# Patient Record
Sex: Female | Born: 1955 | Race: Black or African American | Hispanic: No | Marital: Married | Smoking: Never smoker
Health system: Southern US, Community
[De-identification: ages and names within clinical notes are randomized; demographics above are authoritative.]

## PROBLEM LIST (undated history)

## (undated) DIAGNOSIS — K922 Gastrointestinal hemorrhage, unspecified: Secondary | ICD-10-CM

## (undated) DIAGNOSIS — E785 Hyperlipidemia, unspecified: Secondary | ICD-10-CM

## (undated) DIAGNOSIS — J439 Emphysema, unspecified: Secondary | ICD-10-CM

## (undated) HISTORY — PX: OOPHORECTOMY: SHX86

## (undated) HISTORY — PX: NO PAST SURGERIES: SHX2092

---

## 2004-11-17 ENCOUNTER — Other Ambulatory Visit: Payer: Self-pay

## 2004-11-17 ENCOUNTER — Emergency Department: Payer: Self-pay | Admitting: Emergency Medicine

## 2013-01-12 ENCOUNTER — Emergency Department: Payer: Self-pay | Admitting: Emergency Medicine

## 2013-01-21 ENCOUNTER — Ambulatory Visit: Payer: Self-pay | Admitting: Specialist

## 2013-01-29 ENCOUNTER — Ambulatory Visit: Payer: Self-pay | Admitting: Specialist

## 2014-08-22 NOTE — Consult Note (Signed)
Brief Consult Note: Diagnosis: Displaced right bimalleolar ankle fracture.   Patient was seen by consultant.   Recommend to proceed with surgery or procedure.   Recommend further assessment or treatment.   Orders entered.   Discussed with Attending MD.   Comments: 59 year old female slipped on a ramp at uncle's house today injuring the right ankle.  Brought to Emergency Room where exam and X-rays show a displaced bimalleolar fracture right ankle. I recommended open reduction and internal fixation of the fracture but she does not want surgery if at all possible.  We discussed risk of poor healing or healing out of place in a cast but she wants to try this. Will plan on closed reduction under intrarticular anesthesia.    Exam:  Alert and cooperative.  circulation/sensation/motor function good and skin intact.  Moderate swelling of ankle.  Pain with range of motion.  X-rays:  as above  Rx:  closed reduction done with intraarticular 2% lidocaine.  short leg cast applied        Post reduction X-rays show improved position.          Will discharge home with ice and elevation.        return to clinic 5-6 days for X-rays. Have advised patient and husband that surgery may be needed later.  Electronic Signatures: Park Breed (MD)  (Signed 13-Sep-14 17:33)  Authored: Brief Consult Note   Last Updated: 13-Sep-14 17:33 by Park Breed (MD)

## 2016-10-31 ENCOUNTER — Emergency Department: Payer: Medicaid Other

## 2016-10-31 ENCOUNTER — Encounter: Payer: Self-pay | Admitting: Emergency Medicine

## 2016-10-31 ENCOUNTER — Inpatient Hospital Stay
Admission: EM | Admit: 2016-10-31 | Discharge: 2016-11-09 | DRG: 641 | Disposition: A | Discharge status: Home Health | Payer: Medicaid Other | Attending: Internal Medicine | Admitting: Internal Medicine

## 2016-10-31 DIAGNOSIS — R609 Edema, unspecified: Secondary | ICD-10-CM

## 2016-10-31 DIAGNOSIS — R911 Solitary pulmonary nodule: Secondary | ICD-10-CM | POA: Diagnosis present

## 2016-10-31 DIAGNOSIS — Z7982 Long term (current) use of aspirin: Secondary | ICD-10-CM

## 2016-10-31 DIAGNOSIS — R52 Pain, unspecified: Secondary | ICD-10-CM

## 2016-10-31 DIAGNOSIS — I272 Pulmonary hypertension, unspecified: Secondary | ICD-10-CM | POA: Diagnosis present

## 2016-10-31 DIAGNOSIS — J9811 Atelectasis: Secondary | ICD-10-CM | POA: Diagnosis present

## 2016-10-31 DIAGNOSIS — R000 Tachycardia, unspecified: Secondary | ICD-10-CM | POA: Diagnosis present

## 2016-10-31 DIAGNOSIS — E785 Hyperlipidemia, unspecified: Secondary | ICD-10-CM | POA: Diagnosis present

## 2016-10-31 DIAGNOSIS — E860 Dehydration: Principal | ICD-10-CM | POA: Diagnosis present

## 2016-10-31 DIAGNOSIS — Z79899 Other long term (current) drug therapy: Secondary | ICD-10-CM

## 2016-10-31 DIAGNOSIS — M25461 Effusion, right knee: Secondary | ICD-10-CM | POA: Diagnosis present

## 2016-10-31 DIAGNOSIS — Y92002 Bathroom of unspecified non-institutional (private) residence single-family (private) house as the place of occurrence of the external cause: Secondary | ICD-10-CM

## 2016-10-31 DIAGNOSIS — M25569 Pain in unspecified knee: Secondary | ICD-10-CM

## 2016-10-31 DIAGNOSIS — R0902 Hypoxemia: Secondary | ICD-10-CM

## 2016-10-31 DIAGNOSIS — R0602 Shortness of breath: Secondary | ICD-10-CM

## 2016-10-31 DIAGNOSIS — J439 Emphysema, unspecified: Secondary | ICD-10-CM | POA: Diagnosis present

## 2016-10-31 DIAGNOSIS — R531 Weakness: Secondary | ICD-10-CM

## 2016-10-31 DIAGNOSIS — M2391 Unspecified internal derangement of right knee: Secondary | ICD-10-CM | POA: Diagnosis present

## 2016-10-31 DIAGNOSIS — I2510 Atherosclerotic heart disease of native coronary artery without angina pectoris: Secondary | ICD-10-CM | POA: Diagnosis present

## 2016-10-31 DIAGNOSIS — W19XXXA Unspecified fall, initial encounter: Secondary | ICD-10-CM | POA: Diagnosis present

## 2016-10-31 HISTORY — DX: Hyperlipidemia, unspecified: E78.5

## 2016-10-31 LAB — CBC
HEMATOCRIT: 47.2 % — AB (ref 35.0–47.0)
Hemoglobin: 14.4 g/dL (ref 12.0–16.0)
MCH: 25.4 pg — ABNORMAL LOW (ref 26.0–34.0)
MCHC: 30.6 g/dL — AB (ref 32.0–36.0)
MCV: 83 fL (ref 80.0–100.0)
Platelets: 245 10*3/uL (ref 150–440)
RBC: 5.68 MIL/uL — ABNORMAL HIGH (ref 3.80–5.20)
RDW: 19.6 % — AB (ref 11.5–14.5)
WBC: 7.3 10*3/uL (ref 3.6–11.0)

## 2016-10-31 LAB — TROPONIN I: Troponin I: <0.03 ng/mL (ref <0.03)

## 2016-10-31 LAB — BRAIN NATRIURETIC PEPTIDE: B Natriuretic Peptide: 13 pg/mL (ref 0.0–100.0)

## 2016-10-31 LAB — BASIC METABOLIC PANEL
Anion gap: 14 (ref 5–15)
BUN: 9 mg/dL (ref 6–20)
CHLORIDE: 102 mmol/L (ref 101–111)
CO2: 27 mmol/L (ref 22–32)
CREATININE: 0.35 mg/dL — AB (ref 0.44–1.00)
Calcium: 9.6 mg/dL (ref 8.9–10.3)
GFR calc Af Amer: >60 mL/min (ref >60)
GFR calc non Af Amer: >60 mL/min (ref >60)
GLUCOSE: 163 mg/dL — AB (ref 65–99)
POTASSIUM: 4.1 mmol/L (ref 3.5–5.1)
Sodium: 143 mmol/L (ref 135–145)

## 2016-10-31 MED ORDER — IOPAMIDOL (ISOVUE-370) INJECTION 76%
75.0000 mL | Freq: Once | INTRAVENOUS | Status: AC | PRN
  Administered 2016-10-31: 75 mL via INTRAVENOUS

## 2016-10-31 MED ORDER — SODIUM CHLORIDE 0.9 % IV BOLUS (SEPSIS)
500.0000 mL | Freq: Once | INTRAVENOUS | Status: AC
  Administered 2016-10-31: 500 mL via INTRAVENOUS

## 2016-10-31 NOTE — ED Notes (Signed)
Pt taken to CT at this time.

## 2016-10-31 NOTE — ED Notes (Signed)
NAD noted at this time. Pt resting in bed with son at bedside. Pt is alert and oriented. Pt's arm repositioned to allow for fluids to run through IV. Pt denies any needs at this time. Will continue to monitor for further patient needs.

## 2016-10-31 NOTE — ED Provider Notes (Signed)
[facility] Emergency Department Provider Note  ____________________________________________   I have reviewed the triage vital signs and the nursing notes.   HISTORY  Chief Complaint Weakness and Fall    HPI Kayla Boyer is a 61 y.o. female  who presents today after notable fall. She was walking in her knee gave out. She has a bad knee. She didn't hit her head, she sank to the floor. She has some into that knee on the right as well as some pain to the right hip. However EMS noted her sats are 86%. Patient is not on home oxygen. She denies any cough or fever. She denies shortness of breath. She feels her normal self. Heart rate is somewhat elevated also for EMS. No history of PE or DVT. Patient states she feels "pretty good". Patient is remarkably poor historian. Rona Ravens level V    History reviewed. No pertinent past medical history.  There are no active problems to display for this patient.   History reviewed. No pertinent surgical history.  Prior to Admission medications   Not on File    Allergies Patient has no known allergies.  History reviewed. No pertinent family history.  Social History Social History  Substance Use Topics  . Smoking status: Never Smoker  . Smokeless tobacco: Never Used  . Alcohol use Yes     Comment: Occ    Review of Systems Constitutional: No fever/chills Eyes: No visual changes. ENT: No sore throat. No stiff neck no neck pain Cardiovascular: Denies chest pain. Respiratory: Denies shortness of breath. Gastrointestinal:   no vomiting.  No diarrhea.  No constipation. Genitourinary: Negative for dysuria. Musculoskeletal: Negative lower extremity swelling Skin: Negative for rash. Neurological: Negative for severe headaches, focal weakness or numbness.   ____________________________________________   PHYSICAL EXAM:  VITAL SIGNS: ED Triage Vitals  Enc Vitals Group     BP 10/31/16 1825 (!) 159/122     Pulse Rate  10/31/16 1825 (!) 129     Resp 10/31/16 1825 (!) 32     Temp 10/31/16 1826 98.5 F (36.9 C)     Temp Source 10/31/16 1826 Oral     SpO2 10/31/16 1824 (!) 88 %     Weight 10/31/16 1825 160 lb (72.6 kg)     Height 10/31/16 1825 5\' 2"  (1.575 m)     Head Circumference --      Peak Flow --      Pain Score --      Pain Loc --      Pain Edu? --      Excl. in [place]? --     Constitutional: Alert and oriented. Well appearing and in no acute distress. Eyes: Conjunctivae are normal Head: Atraumatic HEENT: No congestion/rhinnorhea. Mucous membranes are moist.  Oropharynx non-erythematous Neck:   Nontender with no meningismus, no masses, no stridor Cardiovascular:Mild tachycardia noted, regular rhythm. Grossly normal heart sounds.  Good peripheral circulation. Respiratory: Normal respiratory effort.  No retractions. Lungs CTAB. Abdominal: Soft and nontender. No distention. No guarding no rebound Back:  There is no focal tenderness or step off.  there is no midline tenderness there are no lesions noted. there is no CVA tenderness Musculoskeletal: Mild tenderness to palpation to the right knee with no evidence of acute injury, mild tenderness to the right hip with palpation but full range of motion., no upper extremity tenderness. No joint effusions, no DVT signs strong distal pulses no edema Neurologic:  Normal speech and language. No gross focal neurologic deficits  are appreciated.  Skin:  Skin is warm, dry and intact. No rash noted. Psychiatric: Mood and affect are normal. Speech and behavior are normal.  ____________________________________________   LABS (all labs ordered are listed, but only abnormal results are displayed)  Labs Reviewed  BASIC METABOLIC PANEL - Abnormal; Notable for the following:       Result Value   Glucose, Bld 163 (*)    Creatinine, Ser 0.35 (*)    All other components within normal limits  CBC - Abnormal; Notable for the following:    RBC 5.68 (*)    HCT 47.2 (*)     MCH 25.4 (*)    MCHC 30.6 (*)    RDW 19.6 (*)    All other components within normal limits  BRAIN NATRIURETIC PEPTIDE  TROPONIN I  URINALYSIS, COMPLETE (UACMP) WITH MICROSCOPIC  CBG MONITORING, ED   ____________________________________________  EKG  I personally interpreted any EKGs ordered by me or triage Appears to be sinus tach with no acute ischemic changes. ____________________________________________  DPOEUMPNT  I reviewed any imaging ordered by me or triage that were performed during my shift and, if possible, patient and/or family made aware of any abnormal findings. ____________________________________________   PROCEDURES  Procedure(s) performed: None  Procedures  Critical Care performed: None  ____________________________________________   INITIAL IMPRESSION / ASSESSMENT AND PLAN / ED COURSE  Pertinent labs & imaging results that were available during my care of the patient were reviewed by me and considered in my medical decision making (see chart for details).  Patient here with a non-syncopal fall in minimal discomfort as a result. However more concerning is her tachycardia. She states she's just nervous about being here, we are giving her IV fluids for this and is gradually coming down. However given her hypoxia and tachycardia with no convincing chest x-ray pathology did do a CT scan of her chest CT scan does not show a PE. Patient is 97% on 2 L only take her off she goes down to 89 and 88. The cervix could be chronic but I think there is admission for further observation and evaluation. No evidence of pneumonia or sepsis.    ____________________________________________   FINAL CLINICAL IMPRESSION(S) / ED DIAGNOSES  Final diagnoses:  SOB (shortness of breath)  Hypoxia  Weakness      This chart was dictated using voice recognition software.  Despite best efforts to proofread,  errors can occur which can change meaning.      Schuyler Amor,  MD 10/31/16 2131

## 2016-10-31 NOTE — ED Triage Notes (Signed)
Pt came in from home via [facility].  Pt fell in bathroom with no injury, but stated that she was dead weight when they came to pick her up.  Pt stated that she felt more weak than normal.  Pt states she has had increasing weakness over the past few months.  Pt A&Ox4 and in NAD.

## 2016-10-31 NOTE — ED Notes (Signed)
This RN to bedside at this time. This RN apologized for delay and explained to patient that this RN was caught in critical room. Pt states understanding. Will continue to monitor for further patient needs.

## 2016-10-31 NOTE — H&P (Signed)
[facility] NAME: Kayla Boyer    MR#:  [id number]  DATE OF BIRTH:  12/05/1955  DATE OF ADMISSION:  10/31/2016  PRIMARY CARE PHYSICIAN: Patient, [name]   REQUESTING/REFERRING PHYSICIAN: Burlene Arnt, MD  CHIEF COMPLAINT:   Chief Complaint  Patient presents with  . Weakness  . Fall    HISTORY OF PRESENT ILLNESS:  Kayla Boyer  is a 61 y.o. female who presents with Weakness and fall. Patient states that she has a bad knee, and it gave out on her and she sank to the floor. She denies any trauma, or hitting her head. Here in the ED she was found to be persistently tachycardic, sinus tach. She denies any chest pain or palpitations, or other cardiac equivalent symptoms. Unclear etiology for her sinus tachycardia, and hospitalists were called for admission for further evaluation.  PAST MEDICAL HISTORY:   Past Medical History:  Diagnosis Date  . HLD (hyperlipidemia)     PAST SURGICAL HISTORY:   Past Surgical History:  Procedure Laterality Date  . NO PAST SURGERIES      SOCIAL HISTORY:   Social History  Substance Use Topics  . Smoking status: Never Smoker  . Smokeless tobacco: Never Used  . Alcohol use Yes     Comment: Occ    FAMILY HISTORY:   Family History  Problem Relation Age of Onset  . Family history unknown: Yes    DRUG ALLERGIES:  No Known Allergies  MEDICATIONS AT HOME:   Prior to Admission medications   Medication Sig Start Date End Date Taking? Authorizing Provider  aspirin EC 81 MG tablet Take 81 mg by mouth daily.   Yes [provider]  ferrous sulfate 325 (65 FE) MG tablet Take 325 mg by mouth daily with breakfast.   Yes [provider]  omega-3 acid ethyl esters (LOVAZA) 1 g capsule Take 1 g by mouth daily.   Yes [provider]  vitamin B-12 (CYANOCOBALAMIN) 1000 MCG tablet Take 1,000 mcg by mouth daily.   Yes [provider]  vitamin C (ASCORBIC ACID)  500 MG tablet Take 500 mg by mouth daily.   Yes [provider]    REVIEW OF SYSTEMS:  Review of Systems  Constitutional: Negative for chills, fever, malaise/fatigue and weight loss.  HENT: Negative for ear pain, hearing loss and tinnitus.   Eyes: Negative for blurred vision, double vision, pain and redness.  Respiratory: Negative for cough, hemoptysis and shortness of breath.   Cardiovascular: Negative for chest pain, palpitations, orthopnea and leg swelling.  Gastrointestinal: Negative for abdominal pain, constipation, diarrhea, nausea and vomiting.  Genitourinary: Negative for dysuria, frequency and hematuria.  Musculoskeletal: Positive for falls. Negative for back pain, joint pain and neck pain.  Skin:       No acne, rash, or lesions  Neurological: Negative for dizziness, tremors, focal weakness and weakness.  Endo/Heme/Allergies: Negative for polydipsia. Does not bruise/bleed easily.  Psychiatric/Behavioral: Negative for depression. The patient is not nervous/anxious and does not have insomnia.      VITAL SIGNS:   Vitals:   10/31/16 2130 10/31/16 2200 10/31/16 2230 10/31/16 2300  BP: (!) 159/96 (!) 164/94 (!) 150/88 (!) 157/84  Pulse: (!) 118 (!) 116 (!) 115 (!) 110  Resp: (!) 25 (!) 27 (!) 27 (!) 25  Temp:      TempSrc:      SpO2: 100% 98% 97% 97%  Weight:  Height:       Wt Readings from Last 3 Encounters:  10/31/16 72.6 kg (160 lb)    PHYSICAL EXAMINATION:  Physical Exam  Vitals reviewed. Constitutional: She is oriented to person, place, and time. She appears well-developed and well-nourished. No distress.  HENT:  Head: Normocephalic and atraumatic.  Mouth/Throat: Oropharynx is clear and moist.  Eyes: Conjunctivae and EOM are normal. Pupils are equal, round, and reactive to light. No scleral icterus.  Neck: Normal range of motion. Neck supple. No JVD present. No thyromegaly present.  Cardiovascular: Regular rhythm and intact distal pulses.  Exam  reveals no gallop and no friction rub.   No murmur heard. Tachycardic  Respiratory: Effort normal and breath sounds normal. No respiratory distress. She has no wheezes. She has no rales.  GI: Soft. Bowel sounds are normal. She exhibits no distension. There is no tenderness.  Musculoskeletal: Normal range of motion. She exhibits no edema.  No arthritis, no gout  Lymphadenopathy:    She has no cervical adenopathy.  Neurological: She is alert and oriented to person, place, and time. No cranial nerve deficit.  No dysarthria, no aphasia  Skin: Skin is warm and dry. No rash noted. No erythema.  Psychiatric: She has a normal mood and affect. Her behavior is normal. Judgment and thought content normal.    LABORATORY PANEL:   CBC  Recent Labs Lab 10/31/16 1827  WBC 7.3  HGB 14.4  HCT 47.2*  PLT 245   ------------------------------------------------------------------------------------------------------------------  Chemistries   Recent Labs Lab 10/31/16 1827  NA 143  K 4.1  CL 102  CO2 27  GLUCOSE 163*  BUN 9  CREATININE 0.35*  CALCIUM 9.6   ------------------------------------------------------------------------------------------------------------------  Cardiac Enzymes  Recent Labs Lab 10/31/16 1827  TROPONINI <0.03   ------------------------------------------------------------------------------------------------------------------  RADIOLOGY:  Dg Chest 2 View  Result Date: 10/31/2016 CLINICAL DATA:  Patient fell in the bathroom today. Shortness of breath. EXAM: CHEST  2 VIEW COMPARISON:  None. FINDINGS: Two views study shows mild asymmetric elevation left hemidiaphragm. No evidence for pneumothorax. No overt pulmonary edema. There is basilar atelectasis with probable tiny bilateral pleural effusions. Cardiopericardial silhouette is enlarged. Bones are diffusely demineralized without an acute rib fracture evident. IMPRESSION: Cardiomegaly with basilar atelectasis and  probable tiny bilateral pleural effusions. Electronically Signed   By: Misty Stanley M.D.   On: 10/31/2016 19:57   Dg Knee 2 Views Right  Result Date: 10/31/2016 CLINICAL DATA:  Acute right knee pain following fall today. Initial encounter. EXAM: RIGHT KNEE - 1-2 VIEW COMPARISON:  None. FINDINGS: No evidence of fracture, dislocation, or joint effusion. No evidence of arthropathy or other focal bone abnormality. Soft tissues are unremarkable. IMPRESSION: Negative. Electronically Signed   By: Margarette Canada M.D.   On: 10/31/2016 19:57   Ct Angio Chest Pe W Or Wo Contrast  Result Date: 10/31/2016 CLINICAL DATA:  Shortness of breath.  Fall in the bathroom. EXAM: CT ANGIOGRAPHY CHEST WITH CONTRAST TECHNIQUE: Multidetector CT imaging of the chest was performed using the standard protocol during bolus administration of intravenous contrast. Multiplanar CT image reconstructions and MIPs were obtained to evaluate the vascular anatomy. CONTRAST:  75 cc Isovue 370 IV COMPARISON:  Chest radiograph earlier this day FINDINGS: Cardiovascular: No filling defects in the pulmonary arteries to the proximal segmental level, more distal branches cannot be assessed due to contrast bolus timing. Tortuous thoracic aorta without dissection or acute abnormality. Mild cardiomegaly. There are coronary artery calcifications. No pericardial effusion. Mediastinum/Nodes: Prominent lower paratracheal node  measures 11 mm. No hilar adenopathy. Upper esophagus mildly patulous, distal esophagus decompressed. Visualized thyroid gland is normal. Lungs/Pleura: Lower lobe volume loss with atelectasis. There is an ill-defined 9 x 7 mm nodule the right upper lobe abutting the apical pulmonary artery, series 7, image 19. Trachea mainstem bronchi are patent. No definite pleural fluid. No pulmonary edema. Upper Abdomen: No acute abnormality. Musculoskeletal: There are no acute or suspicious osseous abnormalities. Degenerative change in the spine. 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 Review of the MIP images confirms the above findings. IMPRESSION: 1. No central pulmonary embolus. 2. Minimal coronary artery calcifications.  Mild cardiomegaly. 3. Lower lobe volume loss with atelectasis. 4. Ill-defined 9 x 7 mm right upper lobe pulmonary nodule. Consider one of the following in 3 months for both low-risk and high-risk individuals: (a) repeat chest CT, (b) follow-up PET-CT, or (c) tissue sampling. This recommendation follows the consensus statement: Guidelines for Management of Incidental Pulmonary Nodules Detected on CT Images: From the Fleischner Society 2017; Radiology 2017; 284:228-243. Given this nodule is adjacent to a pulmonary artery, recommend either repeat chest CT or follow-up PET-CT over tissue sampling. Electronically Signed   By: Jeb Levering M.D.   On: 10/31/2016 21:03   Ct Hip Right Wo Contrast  Result Date: 10/31/2016 CLINICAL DATA:  Fall in the bathroom.  Right hip pain. EXAM: CT OF THE RIGHT HIP WITHOUT CONTRAST TECHNIQUE: Multidetector CT imaging of the right hip was performed according to the standard protocol. Multiplanar CT image reconstructions were also generated. COMPARISON:  Radiographs earlier this day. FINDINGS: Bones/Joint/Cartilage No acute fracture. Pubic rami and proximal femur are intact. Mild hip joint space narrowing and acetabular spurring. No hip joint effusion. Scattered regions is low-density in the proximal femur and acetabulum consistent with fatty marrow. Ligaments Suboptimally assessed by CT. Muscles and Tendons Mild fatty atrophy of the gluteal and upper thigh musculature. No intramuscular hematoma or fluid collection. Soft tissues No confluent hematoma.  No acute soft tissue abnormality. IMPRESSION: 1. No acute fracture of the right hip. 2. 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 Electronically Signed   By: Jeb Levering M.D.   On: 10/31/2016 21:10   Dg Hip Unilat W Or Wo Pelvis 2-3  Views Right  Result Date: 10/31/2016 CLINICAL DATA:  Acute right hip pain following fall today. Initial encounter. EXAM: DG HIP (WITH OR WITHOUT PELVIS) 2-3V RIGHT COMPARISON:  None. FINDINGS: No definite right hip fracture identified but the right hip position is asymmetric to the left, which has a more normal appearance. There is no evidence of dislocation. No suspicious focal bony lesions are present. IMPRESSION: No definite acute bony abnormality but the position is asymmetric to the left which may be positional. Consider CT if there is strong clinical suspicion for hip fracture. Electronically Signed   By: Margarette Canada M.D.   On: 10/31/2016 20:01    EKG:   Orders placed or performed during the hospital encounter of 10/31/16  . ED EKG  . ED EKG  . EKG 12-Lead  . EKG 12-Lead    IMPRESSION AND PLAN:  Principal Problem:   Tachycardia - sinus tachycardia. Unclear etiology, perhaps due to deconditioning. TSH was within normal limits. We will keep her on telemetry tonight, hydrate her with IV fluids, monitor closely for any improvement Active Problems:   Fall - seems to be more mechanical in nature rather than cardiac related, however, precautionary workup as above   HLD (hyperlipidemia) - continue home meds  All the records are reviewed and case discussed with ED provider. Management plans discussed with the patient and/or family.  DVT PROPHYLAXIS: SubQ lovenox  GI PROPHYLAXIS: None  ADMISSION STATUS: Observation  CODE STATUS: Full Code Status History    This patient does not have a recorded code status. Please follow your organizational policy for patients in this situation.      TOTAL TIME TAKING CARE OF THIS PATIENT: 40 minutes.   Velencia Lenart [place] 10/31/2016, 11:27 PM  [facility]  Office  [phone]  CC: Primary care physician; Patient, [name]  Note:  This document was prepared using Dragon voice recognition software and may include  unintentional dictation errors.

## 2016-11-01 ENCOUNTER — Observation Stay: Payer: Medicaid Other

## 2016-11-01 LAB — URINALYSIS, COMPLETE (UACMP) WITH MICROSCOPIC
BACTERIA UA: NONE SEEN
BILIRUBIN URINE: NEGATIVE
GLUCOSE, UA: NEGATIVE mg/dL
HGB URINE DIPSTICK: NEGATIVE
Ketones, ur: 5 mg/dL — AB
NITRITE: NEGATIVE
Protein, ur: NEGATIVE mg/dL
SPECIFIC GRAVITY, URINE: 1.038 — AB (ref 1.005–1.030)
pH: 5 (ref 5.0–8.0)

## 2016-11-01 LAB — BASIC METABOLIC PANEL
Anion gap: 5 (ref 5–15)
BUN: 8 mg/dL (ref 6–20)
CALCIUM: 8.8 mg/dL — AB (ref 8.9–10.3)
CHLORIDE: 104 mmol/L (ref 101–111)
CO2: 33 mmol/L — AB (ref 22–32)
Glucose, Bld: 108 mg/dL — ABNORMAL HIGH (ref 65–99)
Potassium: 3.9 mmol/L (ref 3.5–5.1)
SODIUM: 142 mmol/L (ref 135–145)

## 2016-11-01 LAB — CBC
HCT: 38.9 % (ref 35.0–47.0)
Hemoglobin: 12.2 g/dL (ref 12.0–16.0)
MCH: 26.1 pg (ref 26.0–34.0)
MCHC: 31.4 g/dL — ABNORMAL LOW (ref 32.0–36.0)
MCV: 83.1 fL (ref 80.0–100.0)
PLATELETS: 209 10*3/uL (ref 150–440)
RBC: 4.67 MIL/uL (ref 3.80–5.20)
RDW: 19.7 % — AB (ref 11.5–14.5)
WBC: 8.6 10*3/uL (ref 3.6–11.0)

## 2016-11-01 LAB — TSH: TSH: 0.914 u[IU]/mL (ref 0.350–4.500)

## 2016-11-01 MED ORDER — ACETAMINOPHEN 650 MG RE SUPP: 650.0000 mg | Freq: Four times a day (QID) | RECTAL | Status: DC | PRN

## 2016-11-01 MED ORDER — ACETAMINOPHEN 325 MG PO TABS
650.0000 mg | ORAL_TABLET | Freq: Four times a day (QID) | ORAL | Status: DC | PRN
  Administered 2016-11-01 (×2): 650 mg via ORAL
  Filled 2016-11-01 (×2): qty 2

## 2016-11-01 MED ORDER — ONDANSETRON HCL 4 MG/2ML IJ SOLN: 4.0000 mg | Freq: Four times a day (QID) | INTRAMUSCULAR | Status: DC | PRN

## 2016-11-01 MED ORDER — SODIUM CHLORIDE 0.9 % IV SOLN
INTRAVENOUS | Status: AC
  Administered 2016-11-01: 02:00:00 via INTRAVENOUS

## 2016-11-01 MED ORDER — HYDROCODONE-ACETAMINOPHEN 5-325 MG PO TABS
1.0000 | ORAL_TABLET | Freq: Four times a day (QID) | ORAL | Status: DC | PRN
  Administered 2016-11-01 – 2016-11-02 (×3): 1 via ORAL
  Filled 2016-11-01 (×3): qty 1

## 2016-11-01 MED ORDER — ENOXAPARIN SODIUM 40 MG/0.4ML ~~LOC~~ SOLN
40.0000 mg | SUBCUTANEOUS | Status: DC
  Administered 2016-11-01 – 2016-11-09 (×8): 40 mg via SUBCUTANEOUS
  Filled 2016-11-01 (×9): qty 0.4

## 2016-11-01 MED ORDER — ASPIRIN EC 81 MG PO TBEC
81.0000 mg | DELAYED_RELEASE_TABLET | Freq: Every day | ORAL | Status: DC
  Administered 2016-11-01 – 2016-11-09 (×9): 81 mg via ORAL
  Filled 2016-11-01 (×9): qty 1

## 2016-11-01 MED ORDER — OMEGA-3-ACID ETHYL ESTERS 1 G PO CAPS
1.0000 g | ORAL_CAPSULE | Freq: Every day | ORAL | Status: DC
  Administered 2016-11-01 – 2016-11-09 (×9): 1 g via ORAL
  Filled 2016-11-01 (×9): qty 1

## 2016-11-01 MED ORDER — ONDANSETRON HCL 4 MG PO TABS: 4.0000 mg | ORAL_TABLET | Freq: Four times a day (QID) | ORAL | Status: DC | PRN

## 2016-11-01 NOTE — Progress Notes (Signed)
[place] at [place] NAME: Kayla Boyer    MR#:  [id number]  DATE OF BIRTH:  23-Feb-1956  SUBJECTIVE: admitted  for tachycardia, fall and now has right knee swelling, pain. Heart rate is better after fluids. No other complaints. No chest pain or shortness of breath or dizziness.   CHIEF COMPLAINT:   Chief Complaint  Patient presents with  . Weakness  . Fall    REVIEW OF SYSTEMS:   ROS CONSTITUTIONAL: No fever, fatigue or weakness.  EYES: No blurred or double vision.  EARS, NOSE, AND THROAT: No tinnitus or ear pain.  RESPIRATORY: No cough, shortness of breath, wheezing or hemoptysis.  CARDIOVASCULAR: No chest pain, orthopnea, edema.  GASTROINTESTINAL: No nausea, vomiting, diarrhea or abdominal pain.  GENITOURINARY: No dysuria, hematuria.  ENDOCRINE: No polyuria, nocturia,  HEMATOLOGY: No anemia, easy bruising or bleeding SKIN: No rash or lesion. MUSCULOSKELETAL: Right knee pain, swelling NEUROLOGIC: No tingling, numbness, weakness.  PSYCHIATRY: No anxiety or depression.   DRUG ALLERGIES:  No Known Allergies  VITALS:  Blood pressure 124/72, pulse 97, temperature 98.6 F (37 C), temperature source Oral, resp. rate 18, height 5\' 2"  (1.575 m), weight 68 kg (150 lb), SpO2 94 %.  PHYSICAL EXAMINATION:  GENERAL:  61 y.o.-year-old patient lying in the bed with no acute distress.  EYES: Pupils equal, round, reactive to light and accommodation. No scleral icterus. Extraocular muscles intact.  HEENT: Head atraumatic, normocephalic. Oropharynx and nasopharynx clear.  NECK:  Supple, no jugular venous distention. No thyroid enlargement, no tenderness.  LUNGS: Normal breath sounds bilaterally, no wheezing, rales,rhonchi or crepitation. No use of accessory muscles of respiration.  CARDIOVASCULAR: S1, S2 normal. No murmurs, rubs, or gallops.  ABDOMEN: Soft, nontender, nondistended. Bowel sounds present. No organomegaly or mass.   EXTREMITIES: Right knee swelling, decreased range of motion but no tenderness to palpation. NEUROLOGIC: Cranial nerves II through XII are intact. Muscle strength 5/5 in all extremities. Sensation intact. Gait not checked.  PSYCHIATRIC: The patient is alert and oriented x 3.  SKIN: No obvious rash, lesion, or ulcer.    LABORATORY PANEL:   CBC  Recent Labs Lab 11/01/16 0539  WBC 8.6  HGB 12.2  HCT 38.9  PLT 209   ------------------------------------------------------------------------------------------------------------------  Chemistries   Recent Labs Lab 11/01/16 0539  NA 142  K 3.9  CL 104  CO2 33*  GLUCOSE 108*  BUN 8  CREATININE <0.30*  CALCIUM 8.8*   ------------------------------------------------------------------------------------------------------------------  Cardiac Enzymes  Recent Labs Lab 10/31/16 1827  TROPONINI <0.03   ------------------------------------------------------------------------------------------------------------------  RADIOLOGY:  Dg Chest 2 View  Result Date: 10/31/2016 CLINICAL DATA:  Patient fell in the bathroom today. Shortness of breath. EXAM: CHEST  2 VIEW COMPARISON:  None. FINDINGS: Two views study shows mild asymmetric elevation left hemidiaphragm. No evidence for pneumothorax. No overt pulmonary edema. There is basilar atelectasis with probable tiny bilateral pleural effusions. Cardiopericardial silhouette is enlarged. Bones are diffusely demineralized without an acute rib fracture evident. IMPRESSION: Cardiomegaly with basilar atelectasis and probable tiny bilateral pleural effusions. Electronically Signed   By: Misty Stanley M.D.   On: 10/31/2016 19:57   Dg Knee 2 Views Right  Result Date: 10/31/2016 CLINICAL DATA:  Acute right knee pain following fall today. Initial encounter. EXAM: RIGHT KNEE - 1-2 VIEW COMPARISON:  None. FINDINGS: No evidence of fracture, dislocation, or joint effusion. No evidence of arthropathy or other  focal bone abnormality. Soft tissues are unremarkable. IMPRESSION: Negative. Electronically Signed  By: Margarette Canada M.D.   On: 10/31/2016 19:57   Ct Angio Chest Pe W Or Wo Contrast  Result Date: 10/31/2016 CLINICAL DATA:  Shortness of breath.  Fall in the bathroom. EXAM: CT ANGIOGRAPHY CHEST WITH CONTRAST TECHNIQUE: Multidetector CT imaging of the chest was performed using the standard protocol during bolus administration of intravenous contrast. Multiplanar CT image reconstructions and MIPs were obtained to evaluate the vascular anatomy. CONTRAST:  75 cc Isovue 370 IV COMPARISON:  Chest radiograph earlier this day FINDINGS: Cardiovascular: No filling defects in the pulmonary arteries to the proximal segmental level, more distal branches cannot be assessed due to contrast bolus timing. Tortuous thoracic aorta without dissection or acute abnormality. Mild cardiomegaly. There are coronary artery calcifications. No pericardial effusion. Mediastinum/Nodes: Prominent lower paratracheal node measures 11 mm. No hilar adenopathy. Upper esophagus mildly patulous, distal esophagus decompressed. Visualized thyroid gland is normal. Lungs/Pleura: Lower lobe volume loss with atelectasis. There is an ill-defined 9 x 7 mm nodule the right upper lobe abutting the apical pulmonary artery, series 7, image 19. Trachea mainstem bronchi are patent. No definite pleural fluid. No pulmonary edema. Upper Abdomen: No acute abnormality. Musculoskeletal: There are no acute or suspicious osseous abnormalities. Degenerative change in the spine. Probable hemangioma within T8 vertebral body. Review of the MIP images confirms the above findings. IMPRESSION: 1. No central pulmonary embolus. 2. Minimal coronary artery calcifications.  Mild cardiomegaly. 3. Lower lobe volume loss with atelectasis. 4. Ill-defined 9 x 7 mm right upper lobe pulmonary nodule. Consider one of the following in 3 months for both low-risk and high-risk individuals: (a)  repeat chest CT, (b) follow-up PET-CT, or (c) tissue sampling. This recommendation follows the consensus statement: Guidelines for Management of Incidental Pulmonary Nodules Detected on CT Images: From the Fleischner Society 2017; Radiology 2017; 284:228-243. Given this nodule is adjacent to a pulmonary artery, recommend either repeat chest CT or follow-up PET-CT over tissue sampling. Electronically Signed   By: Jeb Levering M.D.   On: 10/31/2016 21:03   Ct Hip Right Wo Contrast  Result Date: 10/31/2016 CLINICAL DATA:  Fall in the bathroom.  Right hip pain. EXAM: CT OF THE RIGHT HIP WITHOUT CONTRAST TECHNIQUE: Multidetector CT imaging of the right hip was performed according to the standard protocol. Multiplanar CT image reconstructions were also generated. COMPARISON:  Radiographs earlier this day. FINDINGS: Bones/Joint/Cartilage No acute fracture. Pubic rami and proximal femur are intact. Mild hip joint space narrowing and acetabular spurring. No hip joint effusion. Scattered regions is low-density in the proximal femur and acetabulum consistent with fatty marrow. Ligaments Suboptimally assessed by CT. Muscles and Tendons Mild fatty atrophy of the gluteal and upper thigh musculature. No intramuscular hematoma or fluid collection. Soft tissues No confluent hematoma.  No acute soft tissue abnormality. IMPRESSION: 1. No acute fracture of the right hip. 2. 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 Electronically Signed   By: Jeb Levering M.D.   On: 10/31/2016 21:10   Dg Hip Unilat W Or Wo Pelvis 2-3 Views Right  Result Date: 10/31/2016 CLINICAL DATA:  Acute right hip pain following fall today. Initial encounter. EXAM: DG HIP (WITH OR WITHOUT PELVIS) 2-3V RIGHT COMPARISON:  None. FINDINGS: No definite right hip fracture identified but the right hip position is asymmetric to the left, which has a more normal appearance. There is no evidence of dislocation. No suspicious focal bony  lesions are present. IMPRESSION: No definite acute bony abnormality but the position is asymmetric to the left  which may be positional. Consider CT if there is strong clinical suspicion for hip fracture. Electronically Signed   By: Margarette Canada M.D.   On: 10/31/2016 20:01    EKG:   Orders placed or performed during the hospital encounter of 10/31/16  . ED EKG  . ED EKG  . EKG 12-Lead  . EKG 12-Lead    ASSESSMENT AND PLAN:   #1. sinus tachycardia likely due to dehydration: The heart rate better now, received IV hydration, she feels better today. #2. right knee pain, swelling CT of the right knee if there is no fracture patient can see orthopedic as an outpatient because she does not have any warmth I don't think she needs any acute intervention. She has chronic  Knee issues. Physical therapy consult placed.;   If CT is negative for any fracture she can be discharged home   All the records are reviewed and case discussed with Care Management/Social Workerr. Management plans discussed with the patient, family and they are in agreement.  CODE STATUS: full  TOTAL TIME TAKING CARE OF THIS PATIENT: 35 minutes.   POSSIBLE D/C IN 1 DAYS, DEPENDING ON CLINICAL CONDITION.   Epifanio Lesches M.D on 11/01/2016 at 1:29 PM  Between 7am to 6pm - Pager - [phone]  After 6pm go to [email or web address] - password [email or web address] [facility]  Office  [phone]  CC: Primary care physician; Patient, [name]   Note: This dictation was prepared with Dragon dictation along with smaller phrase technology. Any transcriptional errors that result from this process are unintentional.

## 2016-11-01 NOTE — Progress Notes (Signed)
To CT via bed.

## 2016-11-01 NOTE — Plan of Care (Signed)
Problem: Safety: Goal: Ability to remain free from injury will improve Outcome: Progressing Fall precautions in place, non skid socks when oob  Problem: Pain Managment: Goal: General experience of comfort will improve Outcome: Not Progressing Prn medications for knee pain

## 2016-11-01 NOTE — Progress Notes (Signed)
Chaplain responded to an OR for AD. CH met with pt, provided AD and education to pt. Pt asked for time to review the document with her son and will call [facility] when ready to complete it. [place] offered prayers and ministry of presence.

## 2016-11-01 NOTE — Progress Notes (Signed)
Back from CT

## 2016-11-01 NOTE — Care Management Note (Signed)
Case Management Note  Patient Details  Name: Keeley Sussman MRN: [id number] Date of Birth: August 22, 1955  Subjective/Objective:                  Met with patient and her husband to discuss discharge planning after patient gave permission for CM assessment. She does not have health insurance, she does not have a PCP, and she states the only medications OTC vitamins.  She relies on her husband for transportation. She states she has been "hopping" with a walker. Patient states her ambulation has progressively become worse she relates to an injury fracturing one of her extremities. She is not on O2 at this time. She appreciates any help she can receive as she states she have limited income. She has not applied for Medicaid. She states she is waiting to turn 63 for Medicare.   Action/Plan: Application delivered and explained to both patient and husband; Referral to [facility] and medication management to assist patient.  Expected Discharge Date:                  Expected Discharge Plan:     In-House Referral:     Discharge planning Services  CM Consult, Medication Assistance, [facility]  Post Acute Care Choice:    Choice offered to:     DME Arranged:    DME Agency:     HH Arranged:    HH Agency:     Status of Service:  In process, will continue to follow  If discussed at Long Length of Stay Meetings, dates discussed:    Additional Comments:  Marshell Garfinkel, RN 11/01/2016, 10:50 AM

## 2016-11-02 LAB — HIV ANTIBODY (ROUTINE TESTING W REFLEX): HIV Screen 4th Generation wRfx: NONREACTIVE

## 2016-11-02 MED ORDER — HYDROCODONE-ACETAMINOPHEN 5-325 MG PO TABS
1.0000 | ORAL_TABLET | Freq: Four times a day (QID) | ORAL | Status: DC | PRN
  Administered 2016-11-02 – 2016-11-05 (×6): 1 via ORAL
  Administered 2016-11-06: 2 via ORAL
  Administered 2016-11-06 – 2016-11-08 (×4): 1 via ORAL
  Filled 2016-11-02: qty 1
  Filled 2016-11-02: qty 2
  Filled 2016-11-02 (×9): qty 1

## 2016-11-02 MED ORDER — IBUPROFEN 400 MG PO TABS
400.0000 mg | ORAL_TABLET | Freq: Four times a day (QID) | ORAL | Status: DC | PRN
Start: 2016-11-02 — End: 2016-11-09
  Administered 2016-11-02 – 2016-11-07 (×7): 400 mg via ORAL
  Filled 2016-11-02 (×8): qty 1

## 2016-11-02 NOTE — Progress Notes (Signed)
[place] at [place] NAME: Kayla Boyer    MR#:  [id number]  DATE OF BIRTH:  10/05/1955  SUBJECTIVE: admitted  for tachycardia, fall and now has right knee swelling, pain. Right knee swollen, pain with range of motion to the left side, says that Motrin is helping. CAT scan of the knee shows lateral ligament tear. Physical therapy consult pending we will transfer the patient to on the floor. Tachycardia resolved. No chest pain or shortness of breath.   CHIEF COMPLAINT:   Chief Complaint  Patient presents with  . Weakness  . Fall    REVIEW OF SYSTEMS:   ROS CONSTITUTIONAL: No fever, fatigue or weakness.  EYES: No blurred or double vision.  EARS, NOSE, AND THROAT: No tinnitus or ear pain.  RESPIRATORY: No cough, shortness of breath, wheezing or hemoptysis.  CARDIOVASCULAR: No chest pain, orthopnea, edema.  GASTROINTESTINAL: No nausea, vomiting, diarrhea or abdominal pain.  GENITOURINARY: No dysuria, hematuria.  ENDOCRINE: No polyuria, nocturia,  HEMATOLOGY: No anemia, easy bruising or bleeding SKIN: No rash or lesion. MUSCULOSKELETAL: Right knee pain, swelling  NEUROLOGIC: No tingling, numbness, weakness.  PSYCHIATRY: No anxiety or depression.   DRUG ALLERGIES:  No Known Allergies  VITALS:  Blood pressure 130/72, pulse 68, temperature 98 F (36.7 C), temperature source Oral, resp. rate 16, height 5\' 2"  (1.575 m), weight 68 kg (150 lb), SpO2 96 %.  PHYSICAL EXAMINATION:  GENERAL:  61 y.o.-year-old patient lying in the bed with no acute distress.  EYES: Pupils equal, round, reactive to light and accommodation. No scleral icterus. Extraocular muscles intact.  HEENT: Head atraumatic, normocephalic. Oropharynx and nasopharynx clear.  NECK:  Supple, no jugular venous distention. No thyroid enlargement, no tenderness.  LUNGS: Normal breath sounds bilaterally, no wheezing, rales,rhonchi or crepitation. No use of accessory muscles of  respiration.  CARDIOVASCULAR: S1, S2 normal. No murmurs, rubs, or gallops.  ABDOMEN: Soft, nontender, nondistended. Bowel sounds present. No organomegaly or mass.  EXTREMITIES: Right knee swelling, decreased range of motion but no tenderness to palpation.Patient has more pain at the knee joint for lateral rotation.with lateral stretch. NEUROLOGIC: Cranial nerves II through XII are intact. Muscle strength 5/5 in all extremities. Sensation intact. Gait not checked.  PSYCHIATRIC: The patient is alert and oriented x 3.  SKIN: No obvious rash, lesion, or ulcer.    LABORATORY PANEL:   CBC  Recent Labs Lab 11/01/16 0539  WBC 8.6  HGB 12.2  HCT 38.9  PLT 209   ------------------------------------------------------------------------------------------------------------------  Chemistries   Recent Labs Lab 11/01/16 0539  NA 142  K 3.9  CL 104  CO2 33*  GLUCOSE 108*  BUN 8  CREATININE <0.30*  CALCIUM 8.8*   ------------------------------------------------------------------------------------------------------------------  Cardiac Enzymes  Recent Labs Lab 10/31/16 1827  TROPONINI <0.03   ------------------------------------------------------------------------------------------------------------------  RADIOLOGY:  Dg Chest 2 View  Result Date: 10/31/2016 CLINICAL DATA:  Patient fell in the bathroom today. Shortness of breath. EXAM: CHEST  2 VIEW COMPARISON:  None. FINDINGS: Two views study shows mild asymmetric elevation left hemidiaphragm. No evidence for pneumothorax. No overt pulmonary edema. There is basilar atelectasis with probable tiny bilateral pleural effusions. Cardiopericardial silhouette is enlarged. Bones are diffusely demineralized without an acute rib fracture evident. IMPRESSION: Cardiomegaly with basilar atelectasis and probable tiny bilateral pleural effusions. Electronically Signed   By: Misty Stanley M.D.   On: 10/31/2016 19:57   Dg Knee 2 Views  Right  Result Date: 10/31/2016 CLINICAL DATA:  Acute right knee  pain following fall today. Initial encounter. EXAM: RIGHT KNEE - 1-2 VIEW COMPARISON:  None. FINDINGS: No evidence of fracture, dislocation, or joint effusion. No evidence of arthropathy or other focal bone abnormality. Soft tissues are unremarkable. IMPRESSION: Negative. Electronically Signed   By: Margarette Canada M.D.   On: 10/31/2016 19:57   Ct Angio Chest Pe W Or Wo Contrast  Result Date: 10/31/2016 CLINICAL DATA:  Shortness of breath.  Fall in the bathroom. EXAM: CT ANGIOGRAPHY CHEST WITH CONTRAST TECHNIQUE: Multidetector CT imaging of the chest was performed using the standard protocol during bolus administration of intravenous contrast. Multiplanar CT image reconstructions and MIPs were obtained to evaluate the vascular anatomy. CONTRAST:  75 cc Isovue 370 IV COMPARISON:  Chest radiograph earlier this day FINDINGS: Cardiovascular: No filling defects in the pulmonary arteries to the proximal segmental level, more distal branches cannot be assessed due to contrast bolus timing. Tortuous thoracic aorta without dissection or acute abnormality. Mild cardiomegaly. There are coronary artery calcifications. No pericardial effusion. Mediastinum/Nodes: Prominent lower paratracheal node measures 11 mm. No hilar adenopathy. Upper esophagus mildly patulous, distal esophagus decompressed. Visualized thyroid gland is normal. Lungs/Pleura: Lower lobe volume loss with atelectasis. There is an ill-defined 9 x 7 mm nodule the right upper lobe abutting the apical pulmonary artery, series 7, image 19. Trachea mainstem bronchi are patent. No definite pleural fluid. No pulmonary edema. Upper Abdomen: No acute abnormality. Musculoskeletal: There are no acute or suspicious osseous abnormalities. Degenerative change in the spine. Probable hemangioma within T8 vertebral body. Review of the MIP images confirms the above findings. IMPRESSION: 1. No central pulmonary embolus.  2. Minimal coronary artery calcifications.  Mild cardiomegaly. 3. Lower lobe volume loss with atelectasis. 4. Ill-defined 9 x 7 mm right upper lobe pulmonary nodule. Consider one of the following in 3 months for both low-risk and high-risk individuals: (a) repeat chest CT, (b) follow-up PET-CT, or (c) tissue sampling. This recommendation follows the consensus statement: Guidelines for Management of Incidental Pulmonary Nodules Detected on CT Images: From the Fleischner Society 2017; Radiology 2017; 284:228-243. Given this nodule is adjacent to a pulmonary artery, recommend either repeat chest CT or follow-up PET-CT over tissue sampling. Electronically Signed   By: Jeb Levering M.D.   On: 10/31/2016 21:03   Ct Knee Right Wo Contrast  Result Date: 11/01/2016 CLINICAL DATA:  Fall.  Knee swelling and pain. EXAM: CT OF THE right KNEE WITHOUT CONTRAST TECHNIQUE: Multidetector CT imaging of the right knee was performed according to the standard protocol. Multiplanar CT image reconstructions were also generated. COMPARISON:  Radiographs from 10/31/2016 FINDINGS: Bones/Joint/Cartilage Degenerative chondral thinning in the medial compartment causing medial compartmental articular space narrowing. Abnormal widening of the articular space in the lateral compartment, at 8 mm. This may imply a lateral ligamentous injury although the fibular collateral ligament is relatively intact in its visualized course (proximal extent indistinct). Marginal spurring in the patellofemoral joint. There is a small osteochondroma of the medial femoral condyles. Marginal spurring in the lateral compartment. Bony demineralization. Cortical indistinctness medially along the lateral femoral intercondylar notch as on image 44/6. Mild cortical articular surface irregularity anteromedially along the lateral tibial plateau, I favor spurring over a mild impaction fracture. Ligaments Suboptimally assessed by CT. Muscles and Tendons Severe muscular  atrophy throughout the distal thigh and proximal calf. Soft tissues Moderate to large hemarthrosis. IMPRESSION: 1. Abnormal widening of the lateral compartment and 8 mm, suggesting possible ligamentous derangement laterally. Distally the fibular collateral ligament appears normal but proximally the  fibular collateral ligament is not well seen. 2. There are irregularities along the lateral tibial plateau surface which are probably from spurring. Underlying bony demineralization reduces sensitivity for subtle impactions. 3. Severe diffuse regional muscular atrophy. 4. Moderate to large hemarthrosis. 5. Degenerative spurring is present. Electronically Signed   By: Van Clines M.D.   On: 11/01/2016 14:54   Ct Hip Right Wo Contrast  Result Date: 10/31/2016 CLINICAL DATA:  Fall in the bathroom.  Right hip pain. EXAM: CT OF THE RIGHT HIP WITHOUT CONTRAST TECHNIQUE: Multidetector CT imaging of the right hip was performed according to the standard protocol. Multiplanar CT image reconstructions were also generated. COMPARISON:  Radiographs earlier this day. FINDINGS: Bones/Joint/Cartilage No acute fracture. Pubic rami and proximal femur are intact. Mild hip joint space narrowing and acetabular spurring. No hip joint effusion. Scattered regions is low-density in the proximal femur and acetabulum consistent with fatty marrow. Ligaments Suboptimally assessed by CT. Muscles and Tendons Mild fatty atrophy of the gluteal and upper thigh musculature. No intramuscular hematoma or fluid collection. Soft tissues No confluent hematoma.  No acute soft tissue abnormality. IMPRESSION: 1. No acute fracture of the right hip. 2. 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 Electronically Signed   By: Jeb Levering M.D.   On: 10/31/2016 21:10   Dg Hip Unilat W Or Wo Pelvis 2-3 Views Right  Result Date: 10/31/2016 CLINICAL DATA:  Acute right hip pain following fall today. Initial encounter. EXAM: DG HIP (WITH OR  WITHOUT PELVIS) 2-3V RIGHT COMPARISON:  None. FINDINGS: No definite right hip fracture identified but the right hip position is asymmetric to the left, which has a more normal appearance. There is no evidence of dislocation. No suspicious focal bony lesions are present. IMPRESSION: No definite acute bony abnormality but the position is asymmetric to the left which may be positional. Consider CT if there is strong clinical suspicion for hip fracture. Electronically Signed   By: Margarette Canada M.D.   On: 10/31/2016 20:01    EKG:   Orders placed or performed during the hospital encounter of 10/31/16  . ED EKG  . ED EKG  . EKG 12-Lead  . EKG 12-Lead    ASSESSMENT AND PLAN:   #1. sinus tachycardia likely due to dehydration: The heart rate better now, received IV hydration, she feels better today.chest is negative for PE. #2. right knee pain, swelling; lateral ligament injury, dehydration: Physical therapy consult, orthopedic consult, transfer the patient or the floor. Continue Motrin, patient needs 1 more day of stay for the consult PT evaluation. D/w family All the records are reviewed and case discussed with Care Management/Social Workerr. Management plans discussed with the patient, family and they are in agreement.  CODE STATUS: full  TOTAL TIME TAKING CARE OF THIS PATIENT: 35 minutes.   POSSIBLE D/C IN 1 DAYS, DEPENDING ON CLINICAL CONDITION.   Epifanio Lesches M.D on 11/02/2016 at 11:37 AM  Between 7am to 6pm - Pager - [phone]  After 6pm go to [email or web address] - password [email or web address] [facility]  Office  [phone]  CC: Primary care physician; Patient, [name]   Note: This dictation was prepared with Dragon dictation along with smaller phrase technology. Any transcriptional errors that result from this process are unintentional.

## 2016-11-02 NOTE — Consult Note (Signed)
ORTHOPAEDIC CONSULTATION  REQUESTING PHYSICIAN: Epifanio Lesches, MD  Chief Complaint: Right knee pain and swelling  HPI: Kayla Boyer is a 61 y.o. female who complains of  right knee pain and swelling after fall at home.  Patient states the pain is better on pain medication now. Patient states she is a walker at baseline. She states she sustained an ankle injury 2 years ago and has experienced progressive weak weakness to the lower extremities.  She has stopped driving as a result.  Past Medical History:  Diagnosis Date  . HLD (hyperlipidemia)    Past Surgical History:  Procedure Laterality Date  . NO PAST SURGERIES     Social History   Social History  . Marital status: Married    Spouse name: N/A  . Number of children: N/A  . Years of education: N/A   Social History Main Topics  . Smoking status: Never Smoker  . Smokeless tobacco: Never Used  . Alcohol use Yes     Comment: Occ  . Drug use: No  . Sexual activity: Not Asked   Other Topics Concern  . None   Social History Narrative  . None   Family History  Problem Relation Age of Onset  . Family history unknown: Yes   No Known Allergies Prior to Admission medications   Medication Sig Start Date End Date Taking? Authorizing Provider  aspirin EC 81 MG tablet Take 81 mg by mouth daily.   Yes [provider]  ferrous sulfate 325 (65 FE) MG tablet Take 325 mg by mouth daily with breakfast.   Yes [provider]  omega-3 acid ethyl esters (LOVAZA) 1 g capsule Take 1 g by mouth daily.   Yes [provider]  vitamin B-12 (CYANOCOBALAMIN) 1000 MCG tablet Take 1,000 mcg by mouth daily.   Yes [provider]  vitamin C (ASCORBIC ACID) 500 MG tablet Take 500 mg by mouth daily.   Yes [provider]   Dg Chest 2 View  Result Date: 10/31/2016 CLINICAL DATA:  Patient fell in the bathroom today. Shortness of breath. EXAM: CHEST  2 VIEW COMPARISON:  None. FINDINGS: Two views study  shows mild asymmetric elevation left hemidiaphragm. No evidence for pneumothorax. No overt pulmonary edema. There is basilar atelectasis with probable tiny bilateral pleural effusions. Cardiopericardial silhouette is enlarged. Bones are diffusely demineralized without an acute rib fracture evident. IMPRESSION: Cardiomegaly with basilar atelectasis and probable tiny bilateral pleural effusions. Electronically Signed   By: Misty Stanley M.D.   On: 10/31/2016 19:57   Dg Knee 2 Views Right  Result Date: 10/31/2016 CLINICAL DATA:  Acute right knee pain following fall today. Initial encounter. EXAM: RIGHT KNEE - 1-2 VIEW COMPARISON:  None. FINDINGS: No evidence of fracture, dislocation, or joint effusion. No evidence of arthropathy or other focal bone abnormality. Soft tissues are unremarkable. IMPRESSION: Negative. Electronically Signed   By: Margarette Canada M.D.   On: 10/31/2016 19:57   Ct Angio Chest Pe W Or Wo Contrast  Result Date: 10/31/2016 CLINICAL DATA:  Shortness of breath.  Fall in the bathroom. EXAM: CT ANGIOGRAPHY CHEST WITH CONTRAST TECHNIQUE: Multidetector CT imaging of the chest was performed using the standard protocol during bolus administration of intravenous contrast. Multiplanar CT image reconstructions and MIPs were obtained to evaluate the vascular anatomy. CONTRAST:  75 cc Isovue 370 IV COMPARISON:  Chest radiograph earlier this day FINDINGS: Cardiovascular: No filling defects in the pulmonary arteries to the proximal segmental level, more distal branches cannot be  assessed due to contrast bolus timing. Tortuous thoracic aorta without dissection or acute abnormality. Mild cardiomegaly. There are coronary artery calcifications. No pericardial effusion. Mediastinum/Nodes: Prominent lower paratracheal node measures 11 mm. No hilar adenopathy. Upper esophagus mildly patulous, distal esophagus decompressed. Visualized thyroid gland is normal. Lungs/Pleura: Lower lobe volume loss with atelectasis.  There is an ill-defined 9 x 7 mm nodule the right upper lobe abutting the apical pulmonary artery, series 7, image 19. Trachea mainstem bronchi are patent. No definite pleural fluid. No pulmonary edema. Upper Abdomen: No acute abnormality. Musculoskeletal: There are no acute or suspicious osseous abnormalities. Degenerative change in the spine. Probable hemangioma within T8 vertebral body. Review of the MIP images confirms the above findings. IMPRESSION: 1. No central pulmonary embolus. 2. Minimal coronary artery calcifications.  Mild cardiomegaly. 3. Lower lobe volume loss with atelectasis. 4. Ill-defined 9 x 7 mm right upper lobe pulmonary nodule. Consider one of the following in 3 months for both low-risk and high-risk individuals: (a) repeat chest CT, (b) follow-up PET-CT, or (c) tissue sampling. This recommendation follows the consensus statement: Guidelines for Management of Incidental Pulmonary Nodules Detected on CT Images: From the Fleischner Society 2017; Radiology 2017; 284:228-243. Given this nodule is adjacent to a pulmonary artery, recommend either repeat chest CT or follow-up PET-CT over tissue sampling. Electronically Signed   By: Jeb Levering M.D.   On: 10/31/2016 21:03   Ct Knee Right Wo Contrast  Result Date: 11/01/2016 CLINICAL DATA:  Fall.  Knee swelling and pain. EXAM: CT OF THE right KNEE WITHOUT CONTRAST TECHNIQUE: Multidetector CT imaging of the right knee was performed according to the standard protocol. Multiplanar CT image reconstructions were also generated. COMPARISON:  Radiographs from 10/31/2016 FINDINGS: Bones/Joint/Cartilage Degenerative chondral thinning in the medial compartment causing medial compartmental articular space narrowing. Abnormal widening of the articular space in the lateral compartment, at 8 mm. This may imply a lateral ligamentous injury although the fibular collateral ligament is relatively intact in its visualized course (proximal extent indistinct).  Marginal spurring in the patellofemoral joint. There is a small osteochondroma of the medial femoral condyles. Marginal spurring in the lateral compartment. Bony demineralization. Cortical indistinctness medially along the lateral femoral intercondylar notch as on image 44/6. Mild cortical articular surface irregularity anteromedially along the lateral tibial plateau, I favor spurring over a mild impaction fracture. Ligaments Suboptimally assessed by CT. Muscles and Tendons Severe muscular atrophy throughout the distal thigh and proximal calf. Soft tissues Moderate to large hemarthrosis. IMPRESSION: 1. Abnormal widening of the lateral compartment and 8 mm, suggesting possible ligamentous derangement laterally. Distally the fibular collateral ligament appears normal but proximally the fibular collateral ligament is not well seen. 2. There are irregularities along the lateral tibial plateau surface which are probably from spurring. Underlying bony demineralization reduces sensitivity for subtle impactions. 3. Severe diffuse regional muscular atrophy. 4. Moderate to large hemarthrosis. 5. Degenerative spurring is present. Electronically Signed   By: Van Clines M.D.   On: 11/01/2016 14:54   Ct Hip Right Wo Contrast  Result Date: 10/31/2016 CLINICAL DATA:  Fall in the bathroom.  Right hip pain. EXAM: CT OF THE RIGHT HIP WITHOUT CONTRAST TECHNIQUE: Multidetector CT imaging of the right hip was performed according to the standard protocol. Multiplanar CT image reconstructions were also generated. COMPARISON:  Radiographs earlier this day. FINDINGS: Bones/Joint/Cartilage No acute fracture. Pubic rami and proximal femur are intact. Mild hip joint space narrowing and acetabular spurring. No hip joint effusion. Scattered regions is low-density in the proximal  femur and acetabulum consistent with fatty marrow. Ligaments Suboptimally assessed by CT. Muscles and Tendons Mild fatty atrophy of the gluteal and upper thigh  musculature. No intramuscular hematoma or fluid collection. Soft tissues No confluent hematoma.  No acute soft tissue abnormality. IMPRESSION: 1. No acute fracture of the right hip. 2. 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 Electronically Signed   By: Jeb Levering M.D.   On: 10/31/2016 21:10   Dg Hip Unilat W Or Wo Pelvis 2-3 Views Right  Result Date: 10/31/2016 CLINICAL DATA:  Acute right hip pain following fall today. Initial encounter. EXAM: DG HIP (WITH OR WITHOUT PELVIS) 2-3V RIGHT COMPARISON:  None. FINDINGS: No definite right hip fracture identified but the right hip position is asymmetric to the left, which has a more normal appearance. There is no evidence of dislocation. No suspicious focal bony lesions are present. IMPRESSION: No definite acute bony abnormality but the position is asymmetric to the left which may be positional. Consider CT if there is strong clinical suspicion for hip fracture. Electronically Signed   By: Margarette Canada M.D.   On: 10/31/2016 20:01    Positive ROS: All other systems have been reviewed and were otherwise negative with the exception of those mentioned in the HPI and as above.  Physical Exam: General: Alert, no acute distress  MUSCULOSKELETAL: Right lower extremity: Patient has a moderate effusion in the right knee. There is no associated erythema or ecchymosis. There is no obvious deformity. There is no palpable defect of the patella quadriceps or patellar tendons. Patient can flex and extend her toes and dorsiflex and plantarflex her ankle. She has minimal tenderness to palpation over the right knee. Passive movement of the right knee does not cause significant pain. She did not demonstrate obvious ligamentous laxity to varus or valgus stress testing at 0 and 30 of flexion or Lachman's test. She has no calf tenderness or lower leg edema. She pedal pulses. She was able to actively flex and extend the range of motion was significantly  limited.  Assessment: Right knee internal derangement  Plan: I reviewed the patient's x-ray and CT scan. There was no obvious fractures. CT scan suggested the possibility of ligamentous injury. I would recommend following up on this with an MRI of the right knee. I placed the order for the MRI. I'm also ordering a knee immobilizer. Patient should wear this if she is out of bed particular for standing or ambulating. Patient will need a walker for assistance with ambulation. I recommend physical therapy evaluation of her balance and gait and for lower extremity strengthening.  Patient gives a history of progressively will worsening weakness and may benefit from a neurologic evaluation.  She does not give a history of neurologic condition such as previous stroke, MS or Parkinson's. I will follow up with the MRI once it is completed.    Thornton Park, MD    11/02/2016 12:56 PM

## 2016-11-02 NOTE — Progress Notes (Signed)
Patient was transferred to room 158 from 2A for a swollen knee?  A&O x4. Immobilizer placed. On tele. On 2L O2. Oriented patient to room, call light, TV, and bed controls. Patient cloth pad was soiled and changed. Patient inquiring about not getting cleaned up this am. Kayla Boyer is visibly dirty. NSL. Bed alarm on for safety.

## 2016-11-02 NOTE — Progress Notes (Signed)
PT Cancellation Note  Patient Details Name: Kayla Boyer MRN: [id number] DOB: July 18, 1955   Cancelled Treatment:    Reason Eval/Treat Not Completed: Patient at procedure or test/unavailable Patient is currently off the floor for MRI testing for her knee, PT will continue to follow for mobility evaluation  consult.   Royce Macadamia PT, DPT, CSCS    11/02/2016, 4:16 PM

## 2016-11-03 ENCOUNTER — Observation Stay: Payer: Medicaid Other

## 2016-11-03 ENCOUNTER — Inpatient Hospital Stay: Payer: Medicaid Other

## 2016-11-03 DIAGNOSIS — W19XXXA Unspecified fall, initial encounter: Secondary | ICD-10-CM | POA: Diagnosis present

## 2016-11-03 DIAGNOSIS — I2510 Atherosclerotic heart disease of native coronary artery without angina pectoris: Secondary | ICD-10-CM | POA: Diagnosis present

## 2016-11-03 DIAGNOSIS — I272 Pulmonary hypertension, unspecified: Secondary | ICD-10-CM | POA: Diagnosis present

## 2016-11-03 DIAGNOSIS — E860 Dehydration: Secondary | ICD-10-CM | POA: Diagnosis not present

## 2016-11-03 DIAGNOSIS — Y92002 Bathroom of unspecified non-institutional (private) residence single-family (private) house as the place of occurrence of the external cause: Secondary | ICD-10-CM | POA: Diagnosis not present

## 2016-11-03 DIAGNOSIS — Z7982 Long term (current) use of aspirin: Secondary | ICD-10-CM | POA: Diagnosis not present

## 2016-11-03 DIAGNOSIS — R0902 Hypoxemia: Secondary | ICD-10-CM | POA: Diagnosis present

## 2016-11-03 DIAGNOSIS — E785 Hyperlipidemia, unspecified: Secondary | ICD-10-CM | POA: Diagnosis present

## 2016-11-03 DIAGNOSIS — R911 Solitary pulmonary nodule: Secondary | ICD-10-CM | POA: Diagnosis present

## 2016-11-03 DIAGNOSIS — M2391 Unspecified internal derangement of right knee: Secondary | ICD-10-CM | POA: Diagnosis present

## 2016-11-03 DIAGNOSIS — R000 Tachycardia, unspecified: Secondary | ICD-10-CM | POA: Diagnosis present

## 2016-11-03 DIAGNOSIS — J9811 Atelectasis: Secondary | ICD-10-CM | POA: Diagnosis present

## 2016-11-03 DIAGNOSIS — M25461 Effusion, right knee: Secondary | ICD-10-CM | POA: Diagnosis present

## 2016-11-03 DIAGNOSIS — J439 Emphysema, unspecified: Secondary | ICD-10-CM | POA: Diagnosis present

## 2016-11-03 DIAGNOSIS — Z79899 Other long term (current) drug therapy: Secondary | ICD-10-CM | POA: Diagnosis not present

## 2016-11-03 DIAGNOSIS — R531 Weakness: Secondary | ICD-10-CM | POA: Diagnosis present

## 2016-11-03 NOTE — Evaluation (Signed)
Physical Therapy Evaluation Patient Details Name: Kayla Boyer MRN: [id number] DOB: 1955-06-16 Today's Date: 11/03/2016   History of Present Illness  61 y/o female presents to hospital with SOB, tachycardia, and R knee pain s/p a fall. Pt states she fell because her knee "gave out". Pt was also found to be hypoxic at ED and has since been placed on 2L of O2. PMH includes HLD.     Clinical Impression  Pt is a pleasant 61 year old on observation for R knee pain and tachycardia s/p a fall. Pt performs bed mobility with +2 mod-max assist and transfers with +2 max assist. Attempted sit to/from stand 2x with +2 max assist, but pt unable to come to upright position with or without RW and assistance provided. Ambulation deferred due to pt's weakness. Pt also required assistance for supine there-ex (mod-max assist for SLR's). Prior to hospital admission, pt was independent in all her ADL's and ambulated with a RW within her home. R knee AROM: -3 to 55deg (limited by pain). SAO2 at rest on room air: 87%. SAO2 at rest on 2L O2: 98%. Pt demonstrates deficits in strength, ROM, and pain. Would benefit from skilled PT to address above deficits and promote optimal return to PLOF. Pt is motivated to participate in therapy. PT recommends dc to SNF. This entire session was guided, instructed, and directly supervised by Greggory Stallion, DPT.     Follow Up Recommendations SNF    Equipment Recommendations  Wheelchair (measurements PT) (hoyer lift, O2, 27/7 care if dc home)    Recommendations for Other Services OT consult     Precautions / Restrictions Precautions Precautions: Fall Required Braces or Orthoses: Knee Immobilizer - Right Knee Immobilizer - Right: On when out of bed or walking (until MRI performed and results come back) Restrictions Weight Bearing Restrictions: Yes RLE Weight Bearing: Weight bearing as tolerated      Mobility  Bed Mobility Overal bed mobility: Needs Assistance Bed Mobility:  Supine to Sit;Sit to Supine     Supine to sit: Mod assist;+2 for physical assistance;HOB elevated Sit to supine: Mod assist;Max assist;+2 for physical assistance;+2 for safety/equipment;HOB elevated   General bed mobility comments: Verbal cueing for hand placement and sequencing supine to/from sit. +2 mod assist for supine to sit and +2 mod-max assist for sit to supine and scooting to HOB. Pt unable to move LLE without physical assistance during bed mobility. Performed on room air - SAO2 87% after transfers. Verbal cueing for upright posture once EOB - pt tended to slump over and have her head dropped to chest.   Transfers Overall transfer level: Needs assistance   Transfers: Sit to/from Stand Sit to Stand: Max assist;+2 physical assistance         General transfer comment: Attempted sit to stand 2x with +2 max assist. Pt unable to achieve upright position with or without RW present and assistance given. Pt's BLE's very weak. Progression to recliner or ambulation deferred due to weakness.   Ambulation/Gait             General Gait Details: Deferred due to pt's weakness.  Stairs            Wheelchair Mobility    Modified Rankin (Stroke Patients Only)       Balance Overall balance assessment: Needs assistance;History of Falls     Sitting balance - Comments: Sat EOB for 1-26min with BUE support and LLE on ground. Pt very slumped - verbal cueing for upright posture.  Standing balance comment: Unable to assess standing balance due to BLE weakness. Considered poor at this time.                              Pertinent Vitals/Pain Pain Assessment: 0-10 Pain Score: 3  Pain Location: R knee Pain Descriptors / Indicators: Grimacing;Guarding Pain Intervention(s): Limited activity within patient's tolerance;Monitored during session;Premedicated before session;Repositioned    Home Living Family/patient expects to be discharged to:: Private  residence Living Arrangements: Spouse/significant other Available Help at Discharge: Family Type of Home: House Home Access: Ramped entrance     Home Layout: One level Home Equipment: Environmental consultant - 2 wheels;Bedside commode      Prior Function Level of Independence: Independent with assistive device(s)         Comments: Pt ambulated with RW previously due to RLE weakness (she fractured her R ankle 78yrs ago s/p a fall), which has progressively gotten worse in last few months.      Hand Dominance        Extremity/Trunk Assessment   Upper Extremity Assessment Upper Extremity Assessment: Generalized weakness (4-/5 B for elbow flex/ext and grip)    Lower Extremity Assessment Lower Extremity Assessment: Generalized weakness (4/5 B for DF/PF, RLE 2/5 and LLE 3/5 for knee flex/ext)       Communication   Communication: HOH  Cognition Arousal/Alertness: Lethargic Behavior During Therapy: Flat affect Overall Cognitive Status: Within Functional Limits for tasks assessed                                 General Comments: Pt lethargic and complained that she couldn't breathe well throughout today's treatment.      General Comments      Exercises Other Exercises Other Exercises: Supine ther-ex x10 B included: ankle pumps (supervision), SLR's (LLE only with mod-max assist), quad set (RLE only with mod-max assist), and hip abd (mod-max assist on RLE, supervision on LLE).   Assessment/Plan    PT Assessment Patient needs continued PT services  PT Problem List Decreased strength;Decreased range of motion;Decreased activity tolerance;Decreased balance;Decreased mobility;Pain       PT Treatment Interventions Gait training;Stair training;Therapeutic activities;Therapeutic exercise;Balance training;Patient/family education    PT Goals (Current goals can be found in the Care Plan section)  Acute Rehab PT Goals Patient Stated Goal: to get stronger PT Goal Formulation:  With patient Time For Goal Achievement: 11/17/16 Potential to Achieve Goals: Fair    Frequency Min 2X/week   Barriers to discharge        Co-evaluation               AM-PAC PT "6 Clicks" Daily Activity  Outcome Measure Difficulty turning over in bed (including adjusting bedclothes, sheets and blankets)?: A Lot Difficulty moving from lying on back to sitting on the side of the bed? : Total Difficulty sitting down on and standing up from a chair with arms (e.g., wheelchair, bedside commode, etc,.)?: Total Help needed moving to and from a bed to chair (including a wheelchair)?: Total Help needed walking in hospital room?: Total Help needed climbing 3-5 steps with a railing? : Total 6 Click Score: 7    End of Session Equipment Utilized During Treatment: Gait belt;Right knee immobilizer Activity Tolerance: Patient limited by fatigue (Limited by weakness) Patient left: in bed;with call bell/phone within reach;with bed alarm set Nurse Communication: Mobility status (Pt not safe  to transfer to [facility] with RN. ) PT Visit Diagnosis: Unsteadiness on feet (R26.81);Muscle weakness (generalized) (M62.81);History of falling (Z91.81);Pain Pain - Right/Left: Right Pain - part of body: Knee    Time: 4970-2637 PT Time Calculation (min) (ACUTE ONLY): 36 min   Charges:         PT G Codes:        Donaciano Eva, PT, SPT  Marni Griffon 11/03/2016, 11:53 AM

## 2016-11-03 NOTE — Progress Notes (Signed)
Patient unable to lay flat for MRI, MD notified. Oxygen is new for patient. New orders for chest x-ray.

## 2016-11-03 NOTE — NC FL2 (Signed)
  [place] LEVEL OF CARE SCREENING TOOL     IDENTIFICATION  Patient Name: Kayla Boyer Birthdate: 01-20-1956 Sex: female Admission Date (Current Location): 10/31/2016  [place] and [place] Number:  Engineering geologist and Address:  [facility], [address] [place], [place] [address]      Provider Number: [id number]  Attending Physician Name and Address:  Kayla Lesches, MD  Relative Name and Phone Number:       Current Level of Care: Hospital Recommended Level of Care: [place] Prior Approval Number:    Date Approved/Denied:   PASRR Number:  [id number])  Discharge Plan: SNF    Current Diagnoses: Patient Active Problem List   Diagnosis Date Noted  . Fall 10/31/2016  . Tachycardia 10/31/2016  . HLD (hyperlipidemia) 10/31/2016    Orientation RESPIRATION BLADDER Height & Weight     Time, Self, Situation, Place  O2 (2 Liters Oxygen ) Continent Weight: 150 lb (68 kg) Height:  5\' 2"  (157.5 cm)  BEHAVIORAL SYMPTOMS/MOOD NEUROLOGICAL BOWEL NUTRITION STATUS   (none)  (none) Continent Diet (Diet: Heart Healthy )  AMBULATORY STATUS COMMUNICATION OF NEEDS Skin   Extensive Assist Verbally Normal                       Personal Care Assistance Level of Assistance  Bathing, Feeding, Dressing Bathing Assistance: Limited assistance Feeding assistance: Independent Dressing Assistance: Limited assistance     Functional Limitations Info  Sight, Hearing, Speech Sight Info: Adequate Hearing Info: Adequate Speech Info: Adequate    SPECIAL CARE FACTORS FREQUENCY  PT (By licensed PT), OT (By licensed OT)     PT Frequency:  (5) OT Frequency:  (5)            Contractures      Additional Factors Info  Code Status Code Status Info:  (Full Code. )             Current Medications (11/03/2016):  This is the current hospital active medication list Current Facility-Administered Medications  Medication  Dose Route Frequency Provider Last Rate Last Dose  . aspirin EC tablet 81 mg  81 mg Oral Daily Kayla Coon, MD   81 mg at 11/03/16 1110  . enoxaparin (LOVENOX) injection 40 mg  40 mg Subcutaneous Q24H Kayla Coon, MD   40 mg at 11/03/16 0530  . HYDROcodone-acetaminophen (NORCO/VICODIN) 5-325 MG per tablet 1-2 tablet  1-2 tablet Oral Q6H PRN Kayla Lesches, MD   1 tablet at 11/02/16 1150  . ibuprofen (ADVIL,MOTRIN) tablet 400 mg  400 mg Oral Q6H PRN Kayla Lesches, MD   400 mg at 11/03/16 1516  . omega-3 acid ethyl esters (LOVAZA) capsule 1 g  1 g Oral Daily Kayla Coon, MD   1 g at 11/03/16 1110  . ondansetron (ZOFRAN) tablet 4 mg  4 mg Oral Q6H PRN Kayla Coon, MD       Or  . ondansetron [facility]) injection 4 mg  4 mg Intravenous Q6H PRN Kayla Coon, MD         Discharge Medications: Please see discharge summary for a list of discharge medications.  Relevant Imaging Results:  Relevant Lab Results:   Additional Information  (SSN: [id number])  Sample, Kayla Beets, LCSW

## 2016-11-03 NOTE — Progress Notes (Signed)
[facility] [place] NAME: Kayla Boyer    MR#:  [id number]  DATE OF BIRTH:  1955/10/16  SUBJECTIVE: Waiting for MRI results. The patient had MRI of the right knee. Patient now has a right knee immobilizer as per orthopedic recommendation. She says her pain is better.  Physical therapy consult pending.   CHIEF COMPLAINT:   Chief Complaint  Patient presents with  . Weakness  . Fall    REVIEW OF SYSTEMS:   ROS CONSTITUTIONAL: No fever, fatigue or weakness.  EYES: No blurred or double vision.  EARS, NOSE, AND THROAT: No tinnitus or ear pain.  RESPIRATORY: No cough, shortness of breath, wheezing or hemoptysis.  CARDIOVASCULAR: No chest pain, orthopnea, edema.  GASTROINTESTINAL: No nausea, vomiting, diarrhea or abdominal pain.  GENITOURINARY: No dysuria, hematuria.  ENDOCRINE: No polyuria, nocturia,  HEMATOLOGY: No anemia, easy bruising or bleeding SKIN: No rash or lesion. MUSCULOSKELETAL: Right knee pain, swelling,  NEUROLOGIC: No tingling, numbness, weakness.  PSYCHIATRY: No anxiety or depression.   DRUG ALLERGIES:  No Known Allergies  VITALS:  Blood pressure 121/69, pulse 75, temperature 98 F (36.7 C), temperature source Oral, resp. rate 18, height 5\' 2"  (1.575 m), weight 68 kg (150 lb), SpO2 94 %.  PHYSICAL EXAMINATION:  GENERAL:  61 y.o.-year-old patient lying in the bed with no acute distress.  EYES: Pupils equal, round, reactive to light and accommodation. No scleral icterus. Extraocular muscles intact.  HEENT: Head atraumatic, normocephalic. Oropharynx and nasopharynx clear.  NECK:  Supple, no jugular venous distention. No thyroid enlargement, no tenderness.  LUNGS: Normal breath sounds bilaterally, no wheezing, rales,rhonchi or crepitation. No use of accessory muscles of respiration.  CARDIOVASCULAR: S1, S2 normal. No murmurs, rubs, or gallops.  ABDOMEN: Soft, nontender, nondistended. Bowel sounds present. No  organomegaly or mass.  EXTREMITIES: Patient has a right knee immobilizer.   NEUROLOGIC: Cranial nerves II through XII are intact. Muscle strength 5/5 in all extremities. Sensation intact. Gait not checked.  PSYCHIATRIC: The patient is alert and oriented x 3.  SKIN: No obvious rash, lesion, or ulcer.    LABORATORY PANEL:   CBC  Recent Labs Lab 11/01/16 0539  WBC 8.6  HGB 12.2  HCT 38.9  PLT 209   ------------------------------------------------------------------------------------------------------------------  Chemistries   Recent Labs Lab 11/01/16 0539  NA 142  K 3.9  CL 104  CO2 33*  GLUCOSE 108*  BUN 8  CREATININE <0.30*  CALCIUM 8.8*   ------------------------------------------------------------------------------------------------------------------  Cardiac Enzymes  Recent Labs Lab 10/31/16 1827  TROPONINI <0.03   ------------------------------------------------------------------------------------------------------------------  RADIOLOGY:  Ct Knee Right Wo Contrast  Result Date: 11/01/2016 CLINICAL DATA:  Fall.  Knee swelling and pain. EXAM: CT OF THE right KNEE WITHOUT CONTRAST TECHNIQUE: Multidetector CT imaging of the right knee was performed according to the standard protocol. Multiplanar CT image reconstructions were also generated. COMPARISON:  Radiographs from 10/31/2016 FINDINGS: Bones/Joint/Cartilage Degenerative chondral thinning in the medial compartment causing medial compartmental articular space narrowing. Abnormal widening of the articular space in the lateral compartment, at 8 mm. This may imply a lateral ligamentous injury although the fibular collateral ligament is relatively intact in its visualized course (proximal extent indistinct). Marginal spurring in the patellofemoral joint. There is a small osteochondroma of the medial femoral condyles. Marginal spurring in the lateral compartment. Bony demineralization. Cortical indistinctness medially  along the lateral femoral intercondylar notch as on image 44/6. Mild cortical articular surface irregularity anteromedially along the lateral tibial plateau, I favor spurring  over a mild impaction fracture. Ligaments Suboptimally assessed by CT. Muscles and Tendons Severe muscular atrophy throughout the distal thigh and proximal calf. Soft tissues Moderate to large hemarthrosis. IMPRESSION: 1. Abnormal widening of the lateral compartment and 8 mm, suggesting possible ligamentous derangement laterally. Distally the fibular collateral ligament appears normal but proximally the fibular collateral ligament is not well seen. 2. There are irregularities along the lateral tibial plateau surface which are probably from spurring. Underlying bony demineralization reduces sensitivity for subtle impactions. 3. Severe diffuse regional muscular atrophy. 4. Moderate to large hemarthrosis. 5. Degenerative spurring is present. Electronically Signed   By: Van Clines M.D.   On: 11/01/2016 14:54    EKG:   Orders placed or performed during the hospital encounter of 10/31/16  . ED EKG  . ED EKG  . EKG 12-Lead  . EKG 12-Lead    ASSESSMENT AND PLAN:   #1. sinus tachycardia likely due to dehydration: The heart rate better now, received IV hydration, she feels better today.chest is negative for PE. #2. right knee pain, swelling; ; MRI of the right knee is ordered by orthopedic. Continue immobilizer, check physical therapy evaluation, MRI is done this morning but results are pending. Further disposition based on physical therapy evaluation,MRI  Of right knee results, the ortho  follow-up.   D/w family All the records are reviewed and case discussed with Care Management/Social Workerr. Management plans discussed with the patient, family and they are in agreement.  CODE STATUS: full  TOTAL TIME TAKING CARE OF THIS PATIENT: 35 minutes.   POSSIBLE D/C IN 1 DAYS, DEPENDING ON CLINICAL  CONDITION.   Epifanio Lesches M.D on 11/03/2016 at 11:04 AM  Between 7am to 6pm - Pager - [phone]  After 6pm go to [email or web address] - password [email or web address] [facility]  Office  [phone]  CC: Primary care physician; Patient, [name]   Note: This dictation was prepared with Dragon dictation along with smaller phrase technology. Any transcriptional errors that result from this process are unintentional.

## 2016-11-03 NOTE — Consult Note (Signed)
Subjective:  Patient reports right knee pain as mild to moderate.  Patient was unable to tolerate the MRI of the right knee today because she was unable to breathe well while lying flat.  Objective:   VITALS:   Vitals:   11/02/16 0438 11/02/16 1259 11/02/16 2210 11/03/16 0830  BP: 130/72 128/72 (!) 147/82 121/69  Pulse: 68 73 94 75  Resp: 16 17 19 18   Temp: 98 F (36.7 C) 98 F (36.7 C) 98.2 F (36.8 C) 98 F (36.7 C)  TempSrc: Oral  Oral Oral  SpO2: 96% 96% 98% 94%  Weight:      Height:        PHYSICAL EXAM:  Right knee: Patient's knee immobilizer is in place. She can flex and extend her toes and dorsiflex and plantar flex her ankle. She has intact sensation light touch in palpable pedal pulses.   LABS  No results found for this or any previous visit (from the past 24 hour(s)).  Dg Chest 1 View  Result Date: 11/03/2016 CLINICAL DATA:  Shortness of breath. Some distortion due to patient body habitus and kyphosis. EXAM: CHEST 1 VIEW COMPARISON:  10/31/2016 chest x-ray and chest CT FINDINGS: The heart is enlarged. Shallow lung inflation. There is bibasilar atelectasis. No frank consolidation or pulmonary edema. IMPRESSION: Bibasilar atelectasis.  Stable cardiomegaly. Electronically Signed   By: Nolon Nations M.D.   On: 11/03/2016 13:37   Ct Knee Right Wo Contrast  Result Date: 11/01/2016 CLINICAL DATA:  Fall.  Knee swelling and pain. EXAM: CT OF THE right KNEE WITHOUT CONTRAST TECHNIQUE: Multidetector CT imaging of the right knee was performed according to the standard protocol. Multiplanar CT image reconstructions were also generated. COMPARISON:  Radiographs from 10/31/2016 FINDINGS: Bones/Joint/Cartilage Degenerative chondral thinning in the medial compartment causing medial compartmental articular space narrowing. Abnormal widening of the articular space in the lateral compartment, at 8 mm. This may imply a lateral ligamentous injury although the fibular collateral ligament  is relatively intact in its visualized course (proximal extent indistinct). Marginal spurring in the patellofemoral joint. There is a small osteochondroma of the medial femoral condyles. Marginal spurring in the lateral compartment. Bony demineralization. Cortical indistinctness medially along the lateral femoral intercondylar notch as on image 44/6. Mild cortical articular surface irregularity anteromedially along the lateral tibial plateau, I favor spurring over a mild impaction fracture. Ligaments Suboptimally assessed by CT. Muscles and Tendons Severe muscular atrophy throughout the distal thigh and proximal calf. Soft tissues Moderate to large hemarthrosis. IMPRESSION: 1. Abnormal widening of the lateral compartment and 8 mm, suggesting possible ligamentous derangement laterally. Distally the fibular collateral ligament appears normal but proximally the fibular collateral ligament is not well seen. 2. There are irregularities along the lateral tibial plateau surface which are probably from spurring. Underlying bony demineralization reduces sensitivity for subtle impactions. 3. Severe diffuse regional muscular atrophy. 4. Moderate to large hemarthrosis. 5. Degenerative spurring is present. Electronically Signed   By: Van Clines M.D.   On: 11/01/2016 14:54    Assessment/Plan:     Principal Problem:   Tachycardia Active Problems:   Fall   HLD (hyperlipidemia)  I recommend patient continue with physical therapy. She may be weightbearing as tolerated on the right knee based on her x-ray and CT scan. She should wear a knee immobilizer whenever she is standing or ambulating but may remove it in bed. The knee immobilizer will support the patient if she has a ligament injury. Patient should follow-up in my office in  approximately 2 weeks for reevaluation.  Will sign off for now.  Patient may be discharged from an orthopedic standpoint and follow up in our office in 2 weeks.    Thornton [place] ,  MD 11/03/2016, 1:55 PM

## 2016-11-03 NOTE — Care Management (Signed)
Met with patient at bedside. Staff checked her O2 sats at rest on RA and she was 92%. She is is dyspneic with minimal exertion. She does not have a chronic respiratory diagnosis for home O2.  Requested primary nurse give patient an incentive spirometer. Patient states she lives with her husband who is not well himself. She can not drive to [facility] and can not get a ride.  She does not have a PCP to follow her for any home health services which she would benefit from. Patient states she has not been able to set up due to the dyspnea. Patient denies ever having a stroke or other neurological condition. She denies any hx during our conversation but remebers something about a doctor telling her a few years ago she may have Emphysema  She states she has a walker and a wheelchair and was walking up until Monday.   RNCM following progression. Not able to get her any home health services at this time. She will not qualify for home O2 without a chronic respiratory or cardiac condition.

## 2016-11-03 NOTE — Clinical Social Work Note (Signed)
Clinical Social Work Assessment  Patient Details  Name: Kayla Boyer MRN: [id number] Date of Birth: 1955/06/09  Date of referral:  11/03/16               Reason for consult:  Facility Placement                Permission sought to share information with:    Permission granted to share information::     Name::        Agency::     Relationship::     Contact Information:     Housing/Transportation Living arrangements for the past 2 months:  Single Family Home Source of Information:  Patient Patient Interpreter Needed:  None Criminal Activity/Legal Involvement Pertinent to Current Situation/Hospitalization:  No - Comment as needed Significant Relationships:  Spouse Lives with:  Spouse Do you feel safe going back to the place where you live?  Yes Need for family participation in patient care:  Yes (Comment)  Care giving concerns:  Patient lives in [place] with her husband Kayla Boyer.    Social Worker assessment / plan:  Holiday representative (CSW) received verbal consult from PT that recommendation is SNF. Patient has no insurance and no payer for SNF. Per PT patient's baseline is walking house hold distances using a walker. CSW met with patient alone at bedside to discuss D/C plan. Patient was alert and oriented X4 and was laying in the bed. CSW introduced self and explained role of CSW department. Patient reported that she lives with her husband who is 26 yo. Patient reported that she does not have insurance because she can't afford it and is waiting to get on medicare when she is 61 yo. Patient reported that she is house bound at baseline and only walks house hold distances with a walker. Patient reported that she is on room air at baseline. CSW discussed SNF private pay option. Patient reported that she can't afford SNF private pay and will go home once her knee is better and she is off the oxygen. CSW provided emotional support.   CSW discussed case with CSW director Zack who stated that  an LOG is not an option at this time. RN case manager aware of above. CSW will continue to follow and assist as needed.   Employment status:  Disabled (Comment on whether or not currently receiving Disability) Insurance information:  Self Pay (Medicaid Pending) PT Recommendations:  [place] / Referral to community resources:  [place]  Patient/Family's Response to care:  Patient is agreeable to return home.   Patient/Family's Understanding of and Emotional Response to Diagnosis, Current Treatment, and Prognosis:  Patient was pleasant and thanked CSW for visit.   Emotional Assessment Appearance:  Appears stated age Attitude/Demeanor/Rapport:    Affect (typically observed):  Accepting, Adaptable, Pleasant Orientation:  Oriented to Self, Oriented to Place, Oriented to  Time, Oriented to Situation Alcohol / Substance use:  Not Applicable Psych involvement (Current and /or in the community):  No (Comment)  Discharge Needs  Concerns to be addressed:  Discharge Planning Concerns Readmission within the last 30 days:  No Current discharge risk:  Dependent with Mobility, Chronically ill Barriers to Discharge:  Continued Medical Work up   [employer], [name], LCSW 11/03/2016, 5:08 PM

## 2016-11-04 ENCOUNTER — Inpatient Hospital Stay
Admit: 2016-11-04 | Discharge: 2016-11-04 | Disposition: A | Discharge status: Home / Self Care | Payer: Medicaid Other | Attending: Internal Medicine | Admitting: Internal Medicine

## 2016-11-04 LAB — CBC
HEMATOCRIT: 36.5 % (ref 35.0–47.0)
Hemoglobin: 11.3 g/dL — ABNORMAL LOW (ref 12.0–16.0)
MCH: 25.7 pg — ABNORMAL LOW (ref 26.0–34.0)
MCHC: 30.9 g/dL — AB (ref 32.0–36.0)
MCV: 83.2 fL (ref 80.0–100.0)
PLATELETS: 253 10*3/uL (ref 150–440)
RBC: 4.38 MIL/uL (ref 3.80–5.20)
RDW: 18.9 % — AB (ref 11.5–14.5)
WBC: 6.5 10*3/uL (ref 3.6–11.0)

## 2016-11-04 LAB — CREATININE, SERUM

## 2016-11-04 LAB — ECHOCARDIOGRAM COMPLETE
HEIGHTINCHES: 62 in
WEIGHTICAEL: 2400 [oz_av]

## 2016-11-04 NOTE — Progress Notes (Addendum)
[facility] at [place] NAME: Kayla Boyer    MR#:  [id number]  DATE OF BIRTH:  1956-04-11  SUBJECTIVE: . patient could not have MRI yesterday because she could not lie down flat. Told me that she has shortness of breath unable to lie down flat for long time. No shortness of breath at rest today, O2 sats 100%. We'll do ambulatory oxygen saturations. Patient was told she has emphysema a few years ago. She does have shortness of breath at baseline and unable to lie down flat. She says her knee pain is better. Physical therapy recommended skilled nursing but she cannot go because she has no insurance.   CHIEF COMPLAINT:   Chief Complaint  Patient presents with  . Weakness  . Fall    REVIEW OF SYSTEMS:   ROS CONSTITUTIONAL: No fever, fatigue or weakness.  EYES: No blurred or double vision.  EARS, NOSE, AND THROAT: No tinnitus or ear pain.  RESPIRATORY: No cough, shortness of breath, wheezing or hemoptysis.  CARDIOVASCULAR: No chest pain, orthopnea, edema.  GASTROINTESTINAL: No nausea, vomiting, diarrhea or abdominal pain.  GENITOURINARY: No dysuria, hematuria.  ENDOCRINE: No polyuria, nocturia,  HEMATOLOGY: No anemia, easy bruising or bleeding SKIN: No rash or lesion. MUSCULOSKELETAL: Right knee pain better  NEUROLOGIC: No tingling, numbness, weakness.  PSYCHIATRY: No anxiety or depression.   DRUG ALLERGIES:  No Known Allergies  VITALS:  Blood pressure 130/80, pulse 79, temperature 98.7 F (37.1 C), temperature source Oral, resp. rate 18, height 5\' 2"  (1.575 m), weight 68 kg (150 lb), SpO2 100 %.  PHYSICAL EXAMINATION:  GENERAL:  61 y.o.-year-old patient lying in the bed with no acute distress.  EYES: Pupils equal, round, reactive to light and accommodation. No scleral icterus. Extraocular muscles intact.  HEENT: Head atraumatic, normocephalic. Oropharynx and nasopharynx clear.  NECK:  Supple, no jugular venous distention. No  thyroid enlargement, no tenderness.  LUNGS: Normal breath sounds bilaterally, no wheezing, rales,rhonchi or crepitation. No use of accessory muscles of respiration.  CARDIOVASCULAR: S1, S2 normal. No murmurs, rubs, or gallops.  ABDOMEN: Soft, nontender, nondistended. Bowel sounds present. No organomegaly or mass.  EXTREMITIES: Patient has a right knee immobilizer.   NEUROLOGIC: Cranial nerves II through XII are intact. Muscle strength 5/5 in all extremities. Sensation intact. Gait not checked.  PSYCHIATRIC: The patient is alert and oriented x 3.  SKIN: No obvious rash, lesion, or ulcer.    LABORATORY PANEL:   CBC  Recent Labs Lab 11/04/16 0350  WBC 6.5  HGB 11.3*  HCT 36.5  PLT 253   ------------------------------------------------------------------------------------------------------------------  Chemistries   Recent Labs Lab 11/01/16 0539 11/04/16 0350  NA 142  --   K 3.9  --   CL 104  --   CO2 33*  --   GLUCOSE 108*  --   BUN 8  --   CREATININE <0.30* <0.30*  CALCIUM 8.8*  --    ------------------------------------------------------------------------------------------------------------------  Cardiac Enzymes  Recent Labs Lab 10/31/16 1827  TROPONINI <0.03   ------------------------------------------------------------------------------------------------------------------  RADIOLOGY:  Dg Chest 1 View  Result Date: 11/03/2016 CLINICAL DATA:  Shortness of breath. Some distortion due to patient body habitus and kyphosis. EXAM: CHEST 1 VIEW COMPARISON:  10/31/2016 chest x-ray and chest CT FINDINGS: The heart is enlarged. Shallow lung inflation. There is bibasilar atelectasis. No frank consolidation or pulmonary edema. IMPRESSION: Bibasilar atelectasis.  Stable cardiomegaly. Electronically Signed   By: Nolon Nations M.D.   On: 11/03/2016 13:37  EKG:   Orders placed or performed during the hospital encounter of 10/31/16  . ED EKG  . ED EKG  . EKG 12-Lead   . EKG 12-Lead    ASSESSMENT AND PLAN:   #1. sinus tachycardia likely due to dehydration: The heart rate better now, received IV hydration, she feels better today.chest is negative for PE.does have breath, unable to lie down flat, according to her  this is a chronic problem. Check echocardiogram, ambulatory oxygen saturation to see if she qualifies for home oxygen. Discharge tomorrow morning with home healthPT #2. right knee pain, swelling; ; continue knee immobilizer, discharge home with home physical therapy tomorrow, follow because as outpatient. No further need for workup like MRI because patient cannot lie down flat. , 3, right upper lobe pulmonary nodule by CT and chest, repeat CT chest in 3 months as per recommendations. D/w family All the records are reviewed and case discussed with Care Management/Social Workerr. Management plans discussed with the patient, family and they are in agreement.  CODE STATUS: full  TOTAL TIME TAKING CARE OF THIS PATIENT: 35 minutes.   POSSIBLE D/C IN 1 DAYS, DEPENDING ON CLINICAL CONDITION.   Epifanio Lesches M.D on 11/04/2016 at 11:49 AM  Between 7am to 6pm - Pager - [phone]  After 6pm go to [email or web address] - password [email or web address] [facility]  Office  [phone]  CC: Primary care physician; Patient, [name]   Note: This dictation was prepared with Dragon dictation along with smaller phrase technology. Any transcriptional errors that result from this process are unintentional.

## 2016-11-04 NOTE — Progress Notes (Signed)
*  PRELIMINARY RESULTS* Echocardiogram 2D Echocardiogram has been performed.  Kayla Boyer 11/04/2016, 8:57 AM

## 2016-11-04 NOTE — Evaluation (Signed)
Occupational Therapy Evaluation Patient Details Name: Kayla Boyer MRN: [id number] DOB: [birth year] Today's Date: 11/04/2016    History of Present Illness Pt. is a 61 y.o. female who was admitted to [facility] with Right knee pain following a fall sustained when her knee just gave out, SOB, tachycardia. Pt. became Hypoxic in the ED and was placed on 2L O2. PMHx includes: HLD.   Clinical Impression   Pt. Is a 61 y.o. Female who was admitted to [facility] with right knee pain. Pt. is on 2LO2 which is new since hospital admission. Pt. SO2 98%, HR 83 bpms. Pt. Presents with weakness, decreased activity tolerance, pain, and decreased functional mobility which hinder her ability to complete ADL, and IADL functioning. Pt. Could benefit from skilled oT services for ADL training, A/E training, UE there. Ex. There. Act., and pt. Education about energy conservation/ work simplification techniques, home modification, and DME. Pt. Could benefit from SNF follow-up.      Follow Up Recommendations  SNF    Equipment Recommendations       Recommendations for Other Services       Precautions / Restrictions Precautions Precautions: Fall Required Braces or Orthoses: Knee Immobilizer - Right Knee Immobilizer - Right: On when out of bed or walking Restrictions Weight Bearing Restrictions: No RLE Weight Bearing: Weight bearing as tolerated                                                    ADL either performed or assessed with clinical judgement   ADL Overall ADL's : Needs assistance/impaired Eating/Feeding: Set up;Bed level   Grooming: Set up;Bed level   Upper Body Bathing: Minimal assistance   Lower Body Bathing: Maximal assistance   Upper Body Dressing : Minimal assistance   Lower Body Dressing: Moderate assistance               Functional mobility during ADLs: Maximal assistance;+2 for physical assistance General ADL Comments: Pt. education was provided about A/E use for LE  ADLs.     Vision         Perception     Praxis      Pertinent Vitals/Pain Pain Assessment: 0-10 Pain Score: 3  Pain Location: Right knee Pain Descriptors / Indicators: Aching;Guarding Pain Intervention(s): Limited activity within patient's tolerance;Monitored during session;Premedicated before session     Hand Dominance Right   Extremity/Trunk Assessment Upper Extremity Assessment Upper Extremity Assessment: Generalized weakness           Communication Communication Communication: HOH   Cognition Arousal/Alertness: Lethargic Behavior During Therapy: Flat affect Overall Cognitive Status: Within Functional Limits for tasks assessed                                     General Comments       Exercises     Shoulder Instructions      Home Living Family/patient expects to be discharged to:: Private residence Living Arrangements: Spouse/significant other Available Help at Discharge: Family Type of Home: House Home Access: Ramped entrance     Home Layout: One level     Bathroom Shower/Tub: Tub/shower unit;Curtain   Biochemist, clinical: Standard     Home Equipment: Environmental consultant - 2 wheels;Bedside commode;Grab bars - tub/shower;Shower seat  Prior Functioning/Environment Level of Independence: Needs assistance    ADL's / Homemaking Assistance Needed: Husband assists with ADLs.            OT Problem List: Decreased strength;Decreased activity tolerance;Impaired UE functional use;Pain;Decreased knowledge of use of DME or AE      OT Treatment/Interventions: Therapeutic exercise;Self-care/ADL training;Patient/family education;Therapeutic activities;DME and/or AE instruction;Energy conservation    OT Goals(Current goals can be found in the care plan section) Acute Rehab OT Goals Patient Stated Goal: To go to SNF OT Goal Formulation: With patient Potential to Achieve Goals: Good  OT Frequency: Min 2X/week   Barriers to D/C:             Co-evaluation              AM-PAC PT "6 Clicks" Daily Activity     Outcome Measure Help from another person eating meals?: None Help from another person taking care of personal grooming?: None Help from another person toileting, which includes using toliet, bedpan, or urinal?: A Lot Help from another person bathing (including washing, rinsing, drying)?: A Lot Help from another person to put on and taking off regular upper body clothing?: A Little Help from another person to put on and taking off regular lower body clothing?: A Lot 6 Click Score: 17   End of Session    Activity Tolerance: Patient tolerated treatment well Patient left: in bed;with call bell/phone within reach;with bed alarm set  OT Visit Diagnosis: Muscle weakness (generalized) (M62.81)                Time: 3762-8315 OT Time Calculation (min): 25 min Charges:  OT General Charges $OT Visit: 1 Procedure OT Evaluation $OT Eval Moderate Complexity: 1 Procedure G-Codes:     Harrel Carina, MS, OTR/L   Harrel Carina, MS, OTR/L 11/04/2016, 11:46 AM

## 2016-11-04 NOTE — Progress Notes (Signed)
Physical Therapy Treatment Patient Details Name: Kayla Boyer MRN: [id number] DOB: 02-14-1956 Today's Date: 11/04/2016    History of Present Illness Pt. is a 61 y.o. female who was admitted to [facility] with Right knee pain following a fall sustained when her knee just gave out, SOB, tachycardia. Pt. became Hypoxic in the ED and was placed on 2L O2. PMHx includes: HLD.    PT Comments    Pt progressing toward goals. Attempted standing this session with +2 mod assist and RW off an elevated surface. Pt able to come to standing position, but was unable to tolerate more than touch-toe WB on her RLE due to pain. Pt only able to stand for roughly 15s before needing to sit back down due to weakness, fatigue, and pain. Pt demonstrated improved LE strength/activity tolerance, shown by number of there-ex exercises and repetitions performed. PT continues to recommend SNF given pt's functional mobility below her baseline. Will continue to progress.    Follow Up Recommendations  SNF     Equipment Recommendations  Wheelchair (measurements PT) (hoyer lift, O2, 24/7 care if dc home)    Recommendations for Other Services OT consult     Precautions / Restrictions Precautions Precautions: Fall Required Braces or Orthoses: Knee Immobilizer - Right Knee Immobilizer - Right: On when out of bed or walking Restrictions Weight Bearing Restrictions: Yes RLE Weight Bearing: Weight bearing as tolerated    Mobility  Bed Mobility Overal bed mobility: Needs Assistance Bed Mobility: Sit to Supine       Sit to supine: Mod assist;+2 for physical assistance;+2 for safety/equipment   General bed mobility comments: +2 mod assist for BLE management and scooting to [facility]. Performed on room air: SAO2 87% after transfer.  Transfers Overall transfer level: Needs assistance Equipment used: Rolling walker (2 wheeled) Transfers: Sit to/from Stand Sit to Stand: Mod assist;+2 physical assistance;From elevated surface         General transfer comment: Attempted sit to stand 4x (twice with +1 assist, twice with +2). Pt unable to tolerate more than toe touch WB on RLE. Pt tended to lean heavily to the L and was unable to achieve a fully upright position. Pt requested to sit back down due to weakness, difficulty of task, and R knee pain.   Ambulation/Gait             General Gait Details: Deferred due to pt's weakness.   Stairs            Wheelchair Mobility    Modified Rankin (Stroke Patients Only)       Balance Overall balance assessment: Needs assistance;History of Falls     Sitting balance - Comments: Pt at EOB upon arrival to room. Posture very kypothic with head dropped to chest. Pt able to correct to upright posture with verbal/tactile cueing, but unable to maintain this position for more than 10-15s.        Standing balance comment: Unable to assess standing balance due to BLE weakness. Considered poor at this time.                             Cognition Arousal/Alertness: Awake/alert Behavior During Therapy: WFL for tasks assessed/performed Overall Cognitive Status: Within Functional Limits for tasks assessed  Exercises Other Exercises Other Exercises: Supine ther-ex x12 B included ankle pumps, quad sets, SLR's (modA on LLE, maxA on RLE), hip abd (supervision on LLE, minA on RLE), glute squeezes, and shoulder flexion. Seated ther-ex x12 on LLE included LAQ's.     General Comments        Pertinent Vitals/Pain Pain Assessment: Faces Pain Score: 3  Faces Pain Scale: Hurts a little bit Pain Location: Right knee Pain Descriptors / Indicators: Aching;Guarding Pain Intervention(s): Limited activity within patient's tolerance;Monitored during session;Repositioned    Home Living Family/patient expects to be discharged to:: Private residence Living Arrangements: Spouse/significant other Available Help at  Discharge: Family Type of Home: House Home Access: Ramped entrance   Home Layout: One level Home Equipment: Environmental consultant - 2 wheels;Bedside commode;Grab bars - tub/shower;Shower seat      Prior Function Level of Independence: Needs assistance    ADL's / Homemaking Assistance Needed: Husband assists with ADLs.     PT Goals (current goals can now be found in the care plan section) Acute Rehab PT Goals Patient Stated Goal: To go to SNF PT Goal Formulation: With patient Time For Goal Achievement: 11/17/16 Potential to Achieve Goals: Fair Progress towards PT goals: Progressing toward goals    Frequency    Min 2X/week      PT Plan Current plan remains appropriate    Co-evaluation              AM-PAC PT "6 Clicks" Daily Activity  Outcome Measure  Difficulty turning over in bed (including adjusting bedclothes, sheets and blankets)?: A Lot Difficulty moving from lying on back to sitting on the side of the bed? : Total Difficulty sitting down on and standing up from a chair with arms (e.g., wheelchair, bedside commode, etc,.)?: Total Help needed moving to and from a bed to chair (including a wheelchair)?: Total Help needed walking in hospital room?: Total Help needed climbing 3-5 steps with a railing? : Total 6 Click Score: 7    End of Session Equipment Utilized During Treatment: Gait belt;Right knee immobilizer Activity Tolerance: Patient limited by fatigue;Patient limited by pain (Limited by weakness) Patient left: in bed;with call bell/phone within reach;with family/visitor present Nurse Communication: Mobility status PT Visit Diagnosis: Unsteadiness on feet (R26.81);Muscle weakness (generalized) (M62.81);History of falling (Z91.81);Pain Pain - Right/Left: Right Pain - part of body: Knee     Time: 1100-1129 PT Time Calculation (min) (ACUTE ONLY): 29 min  Charges:                       G Codes:       Kayla Boyer, PT, SPT  Kayla Boyer 11/04/2016, 12:00  PM

## 2016-11-05 ENCOUNTER — Inpatient Hospital Stay: Payer: Medicaid Other

## 2016-11-05 ENCOUNTER — Encounter: Payer: Self-pay | Admitting: Radiology

## 2016-11-05 MED ORDER — IPRATROPIUM-ALBUTEROL 0.5-2.5 (3) MG/3ML IN SOLN
3.0000 mL | Freq: Three times a day (TID) | RESPIRATORY_TRACT | Status: DC
  Administered 2016-11-06 – 2016-11-09 (×11): 3 mL via RESPIRATORY_TRACT
  Filled 2016-11-05 (×11): qty 3

## 2016-11-05 MED ORDER — IPRATROPIUM-ALBUTEROL 0.5-2.5 (3) MG/3ML IN SOLN
3.0000 mL | RESPIRATORY_TRACT | Status: DC
  Administered 2016-11-05 (×3): 3 mL via RESPIRATORY_TRACT
  Filled 2016-11-05 (×3): qty 3

## 2016-11-05 MED ORDER — IPRATROPIUM-ALBUTEROL 0.5-2.5 (3) MG/3ML IN SOLN: 3.0000 mL | RESPIRATORY_TRACT | Status: DC | PRN

## 2016-11-05 MED ORDER — IOPAMIDOL (ISOVUE-300) INJECTION 61%
75.0000 mL | Freq: Once | INTRAVENOUS | Status: AC | PRN
  Administered 2016-11-05: 30 mL via INTRAVENOUS

## 2016-11-05 NOTE — Progress Notes (Signed)
Patient transported to CT 

## 2016-11-05 NOTE — Progress Notes (Signed)
SATURATION QUALIFICATIONS: (This note is used to comply with regulatory documentation for home oxygen)  Patient Saturations on Room Air at Rest = 94%  Patient Saturations on Room Air while standing = 83%  Patient Saturations on 2 Liters of oxygen while standing = 96%

## 2016-11-05 NOTE — Progress Notes (Signed)
Patient a&o, vss. Pain meds given for right knee with relief. Using bedpan to void. Per Dr Vianne Bulls, plan is to discharge today, patient must walk and have oxygen sats recorded. No complaints at this time, will continue to monitor.

## 2016-11-05 NOTE — Progress Notes (Signed)
[facility] NAME: Kayla Boyer    MR#:  [id number]  DATE OF BIRTH:  [birth year]  SUBJECTIVE: . Patient is seen at bedside. Still complains of not able to get up and walk at all. Right knee is swollen. Physical therapy recommended skilled nursing but patient says she cannot afford. Not safe for discharge home Because still having trouble ambulating. Continue to work with physical therapy again today also and if she is doing little better tomorrow as a discharge her home.   CHIEF COMPLAINT:   Chief Complaint  Patient presents with  . Weakness  . Fall    REVIEW OF SYSTEMS:   ROS CONSTITUTIONAL: No fever, fatigue or weakness.  EYES: No blurred or double vision.  EARS, NOSE, AND THROAT: No tinnitus or ear pain.  RESPIRATORY: No cough, shortness of breath, wheezing or hemoptysis.  CARDIOVASCULAR: No chest pain, orthopnea, edema.  GASTROINTESTINAL: No nausea, vomiting, diarrhea or abdominal pain.  GENITOURINARY: No dysuria, hematuria.  ENDOCRINE: No polyuria, nocturia,  HEMATOLOGY: No anemia, easy bruising or bleeding SKIN: No rash or lesion. MUSCULOSKELETAL: Right knee pain better  NEUROLOGIC: No tingling, numbness, weakness.  PSYCHIATRY: No anxiety or depression.   DRUG ALLERGIES:  No Known Allergies  VITALS:  Blood pressure 136/74, pulse 89, temperature 98.2 F (36.8 C), temperature source Oral, resp. rate 18, height 5\' 2"  (1.575 m), weight 68 kg (150 lb), SpO2 91 %.  PHYSICAL EXAMINATION:  GENERAL:  61 y.o.-year-old patient lying in the bed with no acute distress.  EYES: Pupils equal, round, reactive to light and accommodation. No scleral icterus. Extraocular muscles intact.  HEENT: Head atraumatic, normocephalic. Oropharynx and nasopharynx clear.  NECK:  Supple, no jugular venous distention. No thyroid enlargement, no tenderness.  LUNGS: Normal breath sounds bilaterally, no wheezing, rales,rhonchi or crepitation. No  use of accessory muscles of respiration.  CARDIOVASCULAR: S1, S2 normal. No murmurs, rubs, or gallops.  ABDOMEN: Soft, nontender, nondistended. Bowel sounds present. No organomegaly or mass.  EXTREMITIES: Patient has Right knee swelling.   NEUROLOGIC: Cranial nerves II through XII are intact. Muscle strength 5/5 in all extremities. Sensation intact. Gait not checked.  PSYCHIATRIC: The patient is alert and oriented x 3.  SKIN: No obvious rash, lesion, or ulcer.    LABORATORY PANEL:   CBC  Recent Labs Lab 11/04/16 0350  WBC 6.5  HGB 11.3*  HCT 36.5  PLT 253   ------------------------------------------------------------------------------------------------------------------  Chemistries   Recent Labs Lab 11/01/16 0539 11/04/16 0350  NA 142  --   K 3.9  --   CL 104  --   CO2 33*  --   GLUCOSE 108*  --   BUN 8  --   CREATININE <0.30* <0.30*  CALCIUM 8.8*  --    ------------------------------------------------------------------------------------------------------------------  Cardiac Enzymes  Recent Labs Lab 10/31/16 1827  TROPONINI <0.03   ------------------------------------------------------------------------------------------------------------------  RADIOLOGY:  Dg Chest 1 View  Result Date: 11/03/2016 CLINICAL DATA:  Shortness of breath. Some distortion due to patient body habitus and kyphosis. EXAM: CHEST 1 VIEW COMPARISON:  10/31/2016 chest x-ray and chest CT FINDINGS: The heart is enlarged. Shallow lung inflation. There is bibasilar atelectasis. No frank consolidation or pulmonary edema. IMPRESSION: Bibasilar atelectasis.  Stable cardiomegaly. Electronically Signed   By: Nolon Nations M.D.   On: 11/03/2016 13:37    EKG:   Orders placed or performed during the hospital encounter of 10/31/16  . ED EKG  . ED EKG  . EKG 12-Lead  .  EKG 12-Lead    ASSESSMENT AND PLAN:   #1. sinus tachycardia likely due to dehydration: The heart rate better now, received  IV hydration, she feels better today.chest is negative for PE.does have breath, unable to lie down flat, according to her  this is a chronic problem. Echocardiogram shows EF 70%. Check ambulatory oxygen saturation today to see if she qualifies for home oxygen.  #2. right knee pain, swelling; ; continue knee immobilizer, continue physical therapy today also, patientwith husband who is also not able to support her at home,not safe discharge yet.,  3, right upper lobe pulmonary nodule by CT and chest, repeat CT chest in 3 months as per recommendations. D/w family All the records are reviewed and case discussed with Care Management/Social Workerr. Management plans discussed with the patient, family and they are in agreement.  CODE STATUS: full  TOTAL TIME TAKING CARE OF THIS PATIENT: 35 minutes.   POSSIBLE D/C IN 1 DAYS, DEPENDING ON CLINICAL CONDITION.   Epifanio Lesches M.D on 11/05/2016 at 7:51 AM  Between 7am to 6pm - Pager - [phone]  After 6pm go to [email or web address] - password [email or web address] [facility]  Office  [phone]  CC: Primary care physician; Patient, [name]   Note: This dictation was prepared with Dragon dictation along with smaller phrase technology. Any transcriptional errors that result from this process are unintentional.

## 2016-11-05 NOTE — Progress Notes (Signed)
Patient returned from CT

## 2016-11-05 NOTE — Progress Notes (Signed)
Attempted to get patient up to walk to check oxygen sats. Patient was able to get to side of bed with 2 person assist, and stand with walker, most of her weight was being supported by this nurse and the nurse tech Estill Bamberg. Patient was not able to walk and unable to tolerate being up. Patient back in bed and resting.   <<<see previous note for O2 sats>>>

## 2016-11-06 MED ORDER — METOPROLOL TARTRATE 25 MG PO TABS
25.0000 mg | ORAL_TABLET | Freq: Two times a day (BID) | ORAL | Status: DC
  Administered 2016-11-06 – 2016-11-09 (×6): 25 mg via ORAL
  Filled 2016-11-06 (×6): qty 1

## 2016-11-06 MED ORDER — METHYLPREDNISOLONE SODIUM SUCC 125 MG IJ SOLR
60.0000 mg | INTRAMUSCULAR | Status: DC
  Administered 2016-11-06 – 2016-11-09 (×4): 60 mg via INTRAVENOUS
  Filled 2016-11-06 (×4): qty 2

## 2016-11-06 MED ORDER — VANCOMYCIN HCL IN DEXTROSE 1-5 GM/200ML-% IV SOLN
1000.0000 mg | Freq: Once | INTRAVENOUS | Status: AC
  Administered 2016-11-06: 1000 mg via INTRAVENOUS
  Filled 2016-11-06: qty 200

## 2016-11-06 MED ORDER — PIPERACILLIN-TAZOBACTAM 3.375 G IVPB
3.3750 g | Freq: Three times a day (TID) | INTRAVENOUS | Status: AC
  Administered 2016-11-06 – 2016-11-09 (×9): 3.375 g via INTRAVENOUS
  Filled 2016-11-06 (×9): qty 50

## 2016-11-06 MED ORDER — ISOSORBIDE MONONITRATE ER 30 MG PO TB24
30.0000 mg | ORAL_TABLET | Freq: Every day | ORAL | Status: DC
  Administered 2016-11-06 – 2016-11-09 (×4): 30 mg via ORAL
  Filled 2016-11-06 (×4): qty 1

## 2016-11-06 MED ORDER — ASPIRIN 81 MG PO CHEW: 81.0000 mg | CHEWABLE_TABLET | Freq: Every day | ORAL | Status: DC

## 2016-11-06 MED ORDER — VANCOMYCIN HCL IN DEXTROSE 1-5 GM/200ML-% IV SOLN
1000.0000 mg | INTRAVENOUS | Status: AC
  Administered 2016-11-06 – 2016-11-09 (×4): 1000 mg via INTRAVENOUS
  Filled 2016-11-06 (×4): qty 200

## 2016-11-06 NOTE — Progress Notes (Signed)
Pharmacy Antibiotic Note  Kayla Boyer is a 61 y.o. female admitted on 10/31/2016 with pneumonia.  Pharmacy has been consulted for vancomycin and Zosyn dosing.  Plan: Zosyn 3.375g IV q8h (4 hour infusion).   Vancomycin 1 g IV x1 then 1g IV q18h to start ~10 h after initial dose.  Ke 0.052, half life 13.3 h, Vd 41 L - Goal trough 15-20 mcg/ml Vancomycin trough before 4th dose.    Height: 5\' 2"  (157.5 cm) Weight: 150 lb (68 kg) IBW/kg (Calculated) : 50.1  Temp (24hrs), Avg:98 F (36.7 C), Min:98 F (36.7 C), Max:98 F (36.7 C)   Recent Labs Lab 10/31/16 1827 11/01/16 0539 11/04/16 0350  WBC 7.3 8.6 6.5  CREATININE 0.35* <0.30* <0.30*    CrCl cannot be calculated (This lab value cannot be used to calculate CrCl because it is not a number: <0.30).    No Known Allergies    Microbiology results: none  Thank you for allowing pharmacy to be a part of this patient's care.  Rocky Morel 11/06/2016 10:45 AM

## 2016-11-06 NOTE — Progress Notes (Signed)
[place] at [place] NAME: Kayla Boyer    MR#:  [id number]  DATE OF BIRTH:  1956-04-12  Patient had shortness of breath yesterday. And she also had wheezing. H CT chest showed possible pneumonia at bases of right and left. Started on nebulizers. She says she feels slightly better today. Also complains of some chest tightness on and off. CT chest commented on CAD.   CHIEF COMPLAINT:   Chief Complaint  Patient presents with  . Weakness  . Fall    REVIEW OF SYSTEMS:   ROS CONSTITUTIONAL: No fever, fatigue or weakness.  EYES: No blurred or double vision.  EARS, NOSE, AND THROAT: No tinnitus or ear pain.  RESPIRATORY: No cough, shortness of breath, wheezing or hemoptysis.  CARDIOVASCULAR: No chest pain, orthopnea, edema.  GASTROINTESTINAL: No nausea, vomiting, diarrhea or abdominal pain.  GENITOURINARY: No dysuria, hematuria.  ENDOCRINE: No polyuria, nocturia,  HEMATOLOGY: No anemia, easy bruising or bleeding SKIN: No rash or lesion. MUSCULOSKELETAL: Right knee pain better  NEUROLOGIC: No tingling, numbness, weakness.  PSYCHIATRY: No anxiety or depression.   DRUG ALLERGIES:  No Known Allergies  VITALS:  Blood pressure 132/82, pulse 94, temperature 98 F (36.7 C), temperature source Oral, resp. rate 17, height 5\' 2"  (1.575 m), weight 68 kg (150 lb), SpO2 93 %.  PHYSICAL EXAMINATION:  GENERAL:  61 y.o.-year-old patient lying in the bed with no acute distress.  EYES: Pupils equal, round, reactive to light and accommodation. No scleral icterus. Extraocular muscles intact.  HEENT: Head atraumatic, normocephalic. Oropharynx and nasopharynx clear.  NECK:  Supple, no jugular venous distention. No thyroid enlargement, no tenderness.  LUNGS: Normal breath sounds bilaterally, no wheezing, rales,rhonchi or crepitation. No use of accessory muscles of respiration.  CARDIOVASCULAR: S1, S2 normal. No murmurs, rubs, or gallops.  ABDOMEN:  Soft, nontender, nondistended. Bowel sounds present. No organomegaly or mass.  EXTREMITIES: Patient has Right knee swelling.   NEUROLOGIC: Cranial nerves II through XII are intact. Muscle strength 5/5 in all extremities. Sensation intact. Gait not checked.  PSYCHIATRIC: The patient is alert and oriented x 3.  SKIN: No obvious rash, lesion, or ulcer.    LABORATORY PANEL:   CBC  Recent Labs Lab 11/04/16 0350  WBC 6.5  HGB 11.3*  HCT 36.5  PLT 253   ------------------------------------------------------------------------------------------------------------------  Chemistries   Recent Labs Lab 11/01/16 0539 11/04/16 0350  NA 142  --   K 3.9  --   CL 104  --   CO2 33*  --   GLUCOSE 108*  --   BUN 8  --   CREATININE <0.30* <0.30*  CALCIUM 8.8*  --    ------------------------------------------------------------------------------------------------------------------  Cardiac Enzymes  Recent Labs Lab 10/31/16 1827  TROPONINI <0.03   ------------------------------------------------------------------------------------------------------------------  RADIOLOGY:  Ct Chest W Contrast  Result Date: 11/05/2016 CLINICAL DATA:  Oxygen desaturation when lying supine. EXAM: CT CHEST WITH CONTRAST TECHNIQUE: Multidetector CT imaging of the chest was performed during intravenous contrast administration. CONTRAST:  64mL ISOVUE-300 IOPAMIDOL (ISOVUE-300) INJECTION 61% COMPARISON:  Chest radiograph 11/03/2016.  CT 10/31/2016. FINDINGS: Cardiovascular: Poor IV opacification secondary to IV malfunction. Normal aortic caliber with tortuosity. Moderate cardiomegaly. Pulmonary artery enlargement. Lad coronary artery atherosclerosis. Aortic valve calcification. Mediastinum/Nodes: Limited evaluation for thoracic adenopathy. Lungs/Pleura: Trace left pleural fluid is new. Mild motion degradation throughout. The right upper lobe pulmonary nodule is more apparent on the prior exam secondary to better  technique. Example image 38/series 3. Low lung volumes. Similar right  base atelectasis dependently. Slight worsening in left lower lobe airspace disease, primarily posteriorly and inferiorly. Upper Abdomen: Normal imaged portions of the liver, spleen, stomach, pancreas, adrenal glands. Musculoskeletal: Accentuation of expected thoracic kyphosis. IMPRESSION: 1. Degraded exam secondary to motion and lack of IV contrast administration/opacification. 2. Similar right base atelectasis. Development of small left pleural effusion with worsened left lower lobe airspace disease. Primarily felt to represent atelectasis. Developing infection cannot be excluded. 3. Cardiomegaly. Age advanced coronary artery atherosclerosis. Recommend assessment of coronary risk factors and consideration of medical therapy. 4. Pulmonary artery enlargement suggests pulmonary arterial hypertension. 5. Aortic valvular calcifications. Consider echocardiography to evaluate for valvular dysfunction. 6. Right upper lobe pulmonary nodule is more apparent on the prior exam. PC 10/31/2016 report. Electronically Signed   By: Abigail Miyamoto M.D.   On: 11/05/2016 13:56    EKG:   Orders placed or performed during the hospital encounter of 10/31/16  . ED EKG  . ED EKG  . EKG 12-Lead  . EKG 12-Lead    ASSESSMENT AND PLAN:   #1. sinus tachycardia likely due to dehydration: The heart rate better now, received IV hydration, she feels better today.chest is negative for PE.does have breath, unable to lie down flat, according to her  this is a chronic problem. Echocardiogram shows EF 70%.     #2. right knee pain, swelling; ; continue knee immobilizer, continue physical therapy today also, patientwith husband who is also not able to support her at home,not safe discharge yet., According to nurse patient needing two  persons assist  Even  to get up from the bed.   3, right upper lobe pulmonary nodule by CT and chest, repeat CT chest in 3 months as per  recommendations. CT chest with contrast done yesterday showed right base atelectasis, left base atelectasis possibly developing pneumonia. 'continue empiric antibiotics, continue nebulizers, had hypoxia on ambulation with with sats dropping to 83% on room air while ambulating.. Patient also has pulmonary hypertension. 4. coronary atherosclerosis. Start patient on aspirin, small dose beta blockers, nitrates. Can have outpatient cardiology evaluation.  All the records are reviewed and case discussed with Care Management/Social Workerr. Management plans discussed with the patient, family and they are in agreement.  CODE STATUS: full  TOTAL TIME TAKING CARE OF THIS PATIENT: 35 minutes.   POSSIBLE D/C IN 1 DAYS, DEPENDING ON CLINICAL CONDITION.   Epifanio Lesches M.D on 11/06/2016 at 10:07 AM  Between 7am to 6pm - Pager - [phone]  After 6pm go to [email or web address] - password [email or web address] [facility]  Office  [phone]  CC: Primary care physician; Patient, [name]   Note: This dictation was prepared with Dragon dictation along with smaller phrase technology. Any transcriptional errors that result from this process are unintentional.

## 2016-11-07 LAB — CREATININE, SERUM: CREATININE: 0.41 mg/dL — AB (ref 0.44–1.00)

## 2016-11-07 LAB — MRSA PCR SCREENING: MRSA BY PCR: NEGATIVE

## 2016-11-07 NOTE — Care Management (Signed)
Met with patient at bedside. Patient continues to require maximum assistance with transfers and getting in and out of bed. She is showing significant weakness but making very slow progress. She remains on 2 liters and staff has been unable to wean. She states her husband is in poor health and is unable to assist her but she states she has family members that can come in intermittently to assist her with her care. She states she can wear a diaper if needed and perform her own self care if needed. She is determined to go home and care for herself. RNCM does not think that patient has a safe discharge plan at this time.

## 2016-11-07 NOTE — Progress Notes (Signed)
[place] at [place] NAME: Kayla Boyer    MR#:  [id number]  DATE OF BIRTH:  1956-01-23  Says she is feeling little better, less short of breath,   CHIEF COMPLAINT:   Chief Complaint  Patient presents with  . Weakness  . Fall    REVIEW OF SYSTEMS:   ROS CONSTITUTIONAL: No fever, fatigue or weakness.  EYES: No blurred or double vision.  EARS, NOSE, AND THROAT: No tinnitus or ear pain.  RESPIRATORY: No cough, shortness of breath, wheezing or hemoptysis.  CARDIOVASCULAR: No chest pain, orthopnea, edema.  GASTROINTESTINAL: No nausea, vomiting, diarrhea or abdominal pain.  GENITOURINARY: No dysuria, hematuria.  ENDOCRINE: No polyuria, nocturia,  HEMATOLOGY: No anemia, easy bruising or bleeding SKIN: No rash or lesion. MUSCULOSKELETAL: Right knee pain better  NEUROLOGIC: No tingling, numbness, weakness.  PSYCHIATRY: No anxiety or depression.   DRUG ALLERGIES:  No Known Allergies  VITALS:  Blood pressure 121/60, pulse 88, temperature 98.6 F (37 C), temperature source Oral, resp. rate 18, height 5\' 2"  (1.575 m), weight 68 kg (150 lb), SpO2 98 %.  PHYSICAL EXAMINATION:  GENERAL:  61 y.o.-year-old patient lying in the bed with no acute distress.  EYES: Pupils equal, round, reactive to light and accommodation. No scleral icterus. Extraocular muscles intact.  HEENT: Head atraumatic, normocephalic. Oropharynx and nasopharynx clear.  NECK:  Supple, no jugular venous distention. No thyroid enlargement, no tenderness.  LUNGS: Normal breath sounds bilaterally, no wheezing, rales,rhonchi or crepitation. No use of accessory muscles of respiration.  CARDIOVASCULAR: S1, S2 normal. No murmurs, rubs, or gallops.  ABDOMEN: Soft, nontender, nondistended. Bowel sounds present. No organomegaly or mass.  EXTREMITIES: Patient has Right knee swelling.   NEUROLOGIC: Cranial nerves II through XII are intact. Muscle strength 5/5 in all extremities.  Sensation intact. Gait not checked.  PSYCHIATRIC: The patient is alert and oriented x 3.  SKIN: No obvious rash, lesion, or ulcer.    LABORATORY PANEL:   CBC  Recent Labs Lab 11/04/16 0350  WBC 6.5  HGB 11.3*  HCT 36.5  PLT 253   ------------------------------------------------------------------------------------------------------------------  Chemistries   Recent Labs Lab 11/01/16 0539  11/07/16 0528  NA 142  --   --   K 3.9  --   --   CL 104  --   --   CO2 33*  --   --   GLUCOSE 108*  --   --   BUN 8  --   --   CREATININE <0.30*  < > 0.41*  CALCIUM 8.8*  --   --   < > = values in this interval not displayed. ------------------------------------------------------------------------------------------------------------------  Cardiac Enzymes  Recent Labs Lab 10/31/16 1827  TROPONINI <0.03   ------------------------------------------------------------------------------------------------------------------  RADIOLOGY:  Ct Chest W Contrast  Result Date: 11/05/2016 CLINICAL DATA:  Oxygen desaturation when lying supine. EXAM: CT CHEST WITH CONTRAST TECHNIQUE: Multidetector CT imaging of the chest was performed during intravenous contrast administration. CONTRAST:  25mL ISOVUE-300 IOPAMIDOL (ISOVUE-300) INJECTION 61% COMPARISON:  Chest radiograph 11/03/2016.  CT 10/31/2016. FINDINGS: Cardiovascular: Poor IV opacification secondary to IV malfunction. Normal aortic caliber with tortuosity. Moderate cardiomegaly. Pulmonary artery enlargement. Lad coronary artery atherosclerosis. Aortic valve calcification. Mediastinum/Nodes: Limited evaluation for thoracic adenopathy. Lungs/Pleura: Trace left pleural fluid is new. Mild motion degradation throughout. The right upper lobe pulmonary nodule is more apparent on the prior exam secondary to better technique. Example image 38/series 3. Low lung volumes. Similar right base atelectasis dependently. Slight worsening  in left lower lobe  airspace disease, primarily posteriorly and inferiorly. Upper Abdomen: Normal imaged portions of the liver, spleen, stomach, pancreas, adrenal glands. Musculoskeletal: Accentuation of expected thoracic kyphosis. IMPRESSION: 1. Degraded exam secondary to motion and lack of IV contrast administration/opacification. 2. Similar right base atelectasis. Development of small left pleural effusion with worsened left lower lobe airspace disease. Primarily felt to represent atelectasis. Developing infection cannot be excluded. 3. Cardiomegaly. Age advanced coronary artery atherosclerosis. Recommend assessment of coronary risk factors and consideration of medical therapy. 4. Pulmonary artery enlargement suggests pulmonary arterial hypertension. 5. Aortic valvular calcifications. Consider echocardiography to evaluate for valvular dysfunction. 6. Right upper lobe pulmonary nodule is more apparent on the prior exam. PC 10/31/2016 report. Electronically Signed   By: Abigail Miyamoto M.D.   On: 11/05/2016 13:56    EKG:   Orders placed or performed during the hospital encounter of 10/31/16  . ED EKG  . ED EKG  . EKG 12-Lead  . EKG 12-Lead    ASSESSMENT AND PLAN:   #1. sinus tachycardia likely due to dehydration: The heart rate better now, received IV hydration, she feels better today.chest is negative for PE.does have breath, unable to lie down flat, according to her  this is a chronic problem. Echocardiogram shows EF 70%.     #2. right knee pain, swelling; ; continue knee immobilizer, continue physical therapy today also, patientwith husband who is also not able to support her at home,not safe discharge yet., According to nurse patient needing two  persons assist  Even  to get up from the bed.   3, right upper lobe pulmonary nodule by CT and chest, repeat CT chest in 3 months as per recommendations. CT chest with contrast done yesterday showed right base atelectasis, left base atelectasis possibly developing  pneumonia. 'continue empiric antibiotics, continue nebulizers, had hypoxia on ambulation with with sats dropping to 83% on room air while ambulating.. Patient also has pulmonary hypertension.continjue empiric antibiotics, bronchodilators, follow clinical course, 4. coronary atherosclerosis. Start patient on aspirin, small dose beta blockers, nitrates. Can have outpatient cardiology evaluation.  All the records are reviewed and case discussed with Care Management/Social Workerr. Management plans discussed with the patient, family and they are in agreement.  CODE STATUS: full  TOTAL TIME TAKING CARE OF THIS PATIENT: 35 minutes.   POSSIBLE D/C IN 1 DAYS, DEPENDING ON CLINICAL CONDITION.   Epifanio Lesches M.D on 11/07/2016 at 12:18 PM  Between 7am to 6pm - Pager - [phone]  After 6pm go to [email or web address] - password [email or web address] [facility]  Office  [phone]  CC: Primary care physician; Patient, [name]   Note: This dictation was prepared with Dragon dictation along with smaller phrase technology. Any transcriptional errors that result from this process are unintentional.

## 2016-11-07 NOTE — Progress Notes (Signed)
Physical Therapy Treatment Patient Details Name: Kayla Boyer MRN: [id number] DOB: May 30, 1955 Today's Date: 11/07/2016    History of Present Illness Pt. is a 61 y.o. female who was admitted to [facility] with Right knee pain following a fall sustained when her knee just gave out, SOB, tachycardia. Pt. became Hypoxic in the ED and was placed on 2L O2. PMHx includes: HLD.    PT Comments    Pt continues to demonstrate significant weakness with therapist on this date. She requires modA+1 for bed mobility and maxA+1 for sit to stand transfers. Pt endorses less R knee pain with WB on this date which is encouraging. She is still too weak to ambulate and requires maxA+1/dependent assist to transfer from bed to recliner. She is able to participate with supine exercises but with notable bilateral LE weakness. Pt will benefit from PT services to address deficits in strength, balance, and mobility in order to return to full function at home.     Follow Up Recommendations  SNF     Equipment Recommendations  Wheelchair (measurements PT) (hoyer lift, O2, 24/7 if DC home)    Recommendations for Other Services OT consult     Precautions / Restrictions Precautions Precautions: Fall Required Braces or Orthoses: Knee Immobilizer - Right Knee Immobilizer - Right: On when out of bed or walking Restrictions Weight Bearing Restrictions: Yes RLE Weight Bearing: Weight bearing as tolerated    Mobility  Bed Mobility Overal bed mobility: Needs Assistance Bed Mobility: Supine to Sit     Supine to sit: Mod assist;HOB elevated     General bed mobility comments: Bed rails utilized. Cues for sequencing and hand placement. Cues and assist to scoot up to EOB. SaO2>95% during bed mobility  Transfers Overall transfer level: Needs assistance Equipment used: Rolling walker (2 wheeled) Transfers: Sit to/from Stand Sit to Stand: Max assist         General transfer comment: Pt requires heavy cues for proper foot  placement and setup prior to standing. Performed sit to stand transfers x 3 with patient. Pt requires maxA+1 to stand and modA+1 to remain upright. Limited standing tolerance and poor ability to move RLE backwards in immobilizer once standing. Attempted steps but pt unable to perform. Performed dependent transfer with patient from bed to recliner. RN present to observe mobility. Pt denies increase in RLE pain in standing today  Ambulation/Gait             General Gait Details: Unable to perform with attempt on this date due to weakness   Stairs            Wheelchair Mobility    Modified Rankin (Stroke Patients Only)       Balance Overall balance assessment: Needs assistance Sitting-balance support: No upper extremity supported Sitting balance-Leahy Scale: Fair     Standing balance support: Bilateral upper extremity supported Standing balance-Leahy Scale: Poor Standing balance comment: Unable to remain standing without heavy UE support                            Cognition Arousal/Alertness: Awake/alert Behavior During Therapy: WFL for tasks assessed/performed Overall Cognitive Status: Within Functional Limits for tasks assessed                                        Exercises General Exercises - Lower Extremity Ankle Circles/Pumps: AROM;Both;10  reps;Supine Quad Sets: Strengthening;Both;10 reps;Supine Gluteal Sets: Strengthening;Both;10 reps;Supine Short Arc Quad: Strengthening;Both;10 reps;Supine Heel Slides: Strengthening;Both;10 reps;Supine Hip ABduction/ADduction: Strengthening;Both;10 reps;Supine Straight Leg Raises: Strengthening;Both;10 reps;Supine    General Comments        Pertinent Vitals/Pain Pain Assessment: 0-10 Pain Score: 3  Pain Location: Right knee Pain Descriptors / Indicators: Sore Pain Intervention(s): Monitored during session    Home Living                      Prior Function            PT  Goals (current goals can now be found in the care plan section) Acute Rehab PT Goals Patient Stated Goal: To go to SNF PT Goal Formulation: With patient Time For Goal Achievement: 11/17/16 Potential to Achieve Goals: Fair Progress towards PT goals: Progressing toward goals    Frequency    Min 2X/week      PT Plan Current plan remains appropriate    Co-evaluation              AM-PAC PT "6 Clicks" Daily Activity  Outcome Measure  Difficulty turning over in bed (including adjusting bedclothes, sheets and blankets)?: A Lot Difficulty moving from lying on back to sitting on the side of the bed? : Total Difficulty sitting down on and standing up from a chair with arms (e.g., wheelchair, bedside commode, etc,.)?: Total Help needed moving to and from a bed to chair (including a wheelchair)?: Total Help needed walking in hospital room?: Total Help needed climbing 3-5 steps with a railing? : Total 6 Click Score: 7    End of Session Equipment Utilized During Treatment: Gait belt;Right knee immobilizer Activity Tolerance: Patient limited by fatigue Patient left: with call bell/phone within reach;in chair;with chair alarm set Nurse Communication: Mobility status PT Visit Diagnosis: Unsteadiness on feet (R26.81);Muscle weakness (generalized) (M62.81);History of falling (Z91.81);Pain Pain - Right/Left: Right Pain - part of body: Knee     Time: 0921-0951 PT Time Calculation (min) (ACUTE ONLY): 30 min  Charges:  $Therapeutic Exercise: 8-22 mins $Therapeutic Activity: 8-22 mins                    G Codes:       Lyndel Safe Lamontae Ricardo PT, DPT     Areta Terwilliger 11/07/2016, 11:04 AM

## 2016-11-07 NOTE — Progress Notes (Signed)
Patient alert and oriented. Patient complaining of shortness of breath while standing during physical therapy, is now sitting in chair, PT recommending a hoyer lift for nursing staff to transfer back to bed. During PT patient oxygen saturation was 99% on 2L O2. Patient lung sounds diminished. RN re-educated patient on incentive spirometer. Patient only abel to get spirometer to 250. Rn encouraged patient to perform incentive spirometer 10 times every hour. Patient reoriented to room and call bell system.   Deri Fuelling, RN

## 2016-11-07 NOTE — Progress Notes (Signed)
Clinical Education officer, museum (CSW) contacted Careers information officer at [facility] to get information on patient's eligibility for medicaid and disability. Per Latoya patient will not qualify for medicaid or disability based on her and her husband's income. CSW discussed case with CSW director Zack who stated that patient is not eligible for a SNF LOG at this time. RN case manager aware of above. CSW will continue to follow and assist as needed.   [employer], LCSW [phone]

## 2016-11-08 NOTE — Care Management (Signed)
Spoke with patients son, Alfonso Ramus ([phone]) with patient's permission. He states patient's granddaughter will be coming to stay around the clock with patient and her neice will be in and out to help as well.

## 2016-11-08 NOTE — Progress Notes (Signed)
Physical Therapy Treatment Patient Details Name: Kayla Boyer MRN: [id number] DOB: Aug 12, 1955 Today's Date: 11/08/2016    History of Present Illness Pt. is a 61 y.o. female who was admitted to [facility] with Right knee pain following a fall sustained when her knee just gave out, SOB, tachycardia. Pt. became Hypoxic in the ED and was placed on 2L O2. PMHx includes: HLD.    PT Comments    Pt reported low BP today.  BP 126/70 with no c/o dizziness with sitting or transfer.  Participated in exercises as described below.  She was able to get to edge of bed with mod assist and increased time.  She was able to self assist LE's to edge of bed but needed assist to scoot.  Was able to sit without assist once balanced with BUE support.  She sat for 5 minutes before attempting transfer.  Stood with max a x 2 to walker.  She was able to advance R LE slightly but unable to move further with walker so she was assisted back to sitting.  "You moved me to slow"  Explained to pt that we were allowing her time to attempt steps. "You have to do it fast, I'm too weak".  Walker was removed and she was stand pivoted to recliner at bedside with max a x 1/mod a x 1.  CNA reported Harrel Lemon transfer with staff yesterday back to bed.  KI donned for transfer.   Follow Up Recommendations  SNF     Equipment Recommendations  Wheelchair (measurements PT) , She would benefit from [facility] and hospital bed if she refused rehab as recommended.   Recommendations for Other Services OT consult     Precautions / Restrictions Precautions Precautions: Fall Required Braces or Orthoses: Knee Immobilizer - Right Knee Immobilizer - Right: On when out of bed or walking Restrictions Weight Bearing Restrictions: Yes RLE Weight Bearing: Weight bearing as tolerated    Mobility  Bed Mobility Overal bed mobility: Needs Assistance Bed Mobility: Supine to Sit     Supine to sit: Mod assist Sit to supine: Mod assist;+2 for physical assistance;+2  for safety/equipment   General bed mobility comments: Bed rails utilized. Cues for sequencing and hand placement. Cues and assist to scoot up to EOB. SaO2>95% during bed mobility  Transfers Overall transfer level: Needs assistance Equipment used: Rolling walker (2 wheeled) Transfers: Sit to/from Stand Sit to Stand: Max assist;+2 physical assistance            Ambulation/Gait             General Gait Details: Unable to perform with attempt on this date due to weakness   Stairs            Wheelchair Mobility    Modified Rankin (Stroke Patients Only)       Balance Overall balance assessment: Needs assistance Sitting-balance support: No upper extremity supported;Bilateral upper extremity supported Sitting balance-Leahy Scale: Fair Sitting balance - Comments: able to sit without assit with use of rails   Standing balance support: Bilateral upper extremity supported Standing balance-Leahy Scale: Poor Standing balance comment: Unable to remain standing without heavy UE support and assist from staff                            Cognition Arousal/Alertness: Awake/alert Behavior During Therapy: [facility] for tasks assessed/performed Overall Cognitive Status: Within Functional Limits for tasks assessed  Exercises General Exercises - Lower Extremity Ankle Circles/Pumps: AROM;Both;10 reps;Supine Quad Sets: Strengthening;Both;10 reps;Supine Gluteal Sets: Strengthening;Both;10 reps;Supine Short Arc Quad: Strengthening;Both;10 reps;Supine Heel Slides: Strengthening;Both;10 reps;Supine Hip ABduction/ADduction: Strengthening;Both;10 reps;Supine Other Exercises Other Exercises: sat edge of bed x 5 minutes without LOB    General Comments        Pertinent Vitals/Pain Pain Assessment: 0-10 Pain Score: 3  Pain Location: Right knee Pain Descriptors / Indicators: Sore Pain Intervention(s): Limited activity  within patient's tolerance    Home Living                      Prior Function            PT Goals (current goals can now be found in the care plan section) Progress towards PT goals: Progressing toward goals    Frequency    Min 2X/week      PT Plan Current plan remains appropriate    Co-evaluation              AM-PAC PT "6 Clicks" Daily Activity  Outcome Measure  Difficulty turning over in bed (including adjusting bedclothes, sheets and blankets)?: A Lot Difficulty moving from lying on back to sitting on the side of the bed? : Total Difficulty sitting down on and standing up from a chair with arms (e.g., wheelchair, bedside commode, etc,.)?: Total Help needed moving to and from a bed to chair (including a wheelchair)?: Total Help needed walking in hospital room?: Total Help needed climbing 3-5 steps with a railing? : Total 6 Click Score: 7    End of Session Equipment Utilized During Treatment: Gait belt;Right knee immobilizer Activity Tolerance: Patient limited by fatigue Patient left: with call bell/phone within reach;in chair;with chair alarm set   Pain - Right/Left: Right     Time: 6415-8309 PT Time Calculation (min) (ACUTE ONLY): 29 min  Charges:  $Gait Training: 8-22 mins $Therapeutic Exercise: 8-22 mins                    G Codes:       Chesley Noon, PTA 11/08/16, 2:06 PM

## 2016-11-08 NOTE — Progress Notes (Signed)
[place] [facility] [place] NAME: Kayla Boyer    MR#:  [id number]  DATE OF BIRTH:  11/10/1955   says  that she is doing little better. Still has shortness of breath with ambulation. But she is able to light down flat.   CHIEF COMPLAINT:   Chief Complaint  Patient presents with  . Weakness  . Fall    REVIEW OF SYSTEMS:   ROS CONSTITUTIONAL: No fever, fatigue or weakness.  EYES: No blurred or double vision.  EARS, NOSE, AND THROAT: No tinnitus or ear pain.  RESPIRATORY: No cough, shortness of breath, wheezing or hemoptysis.  CARDIOVASCULAR: No chest pain, orthopnea, edema.  GASTROINTESTINAL: No nausea, vomiting, diarrhea or abdominal pain.  GENITOURINARY: No dysuria, hematuria.  ENDOCRINE: No polyuria, nocturia,  HEMATOLOGY: No anemia, easy bruising or bleeding SKIN: No rash or lesion. MUSCULOSKELETAL: Right knee pain better  NEUROLOGIC: No tingling, numbness, weakness.  PSYCHIATRY: No anxiety or depression.   DRUG ALLERGIES:  No Known Allergies  VITALS:  Blood pressure 115/67, pulse 65, temperature 97.8 F (36.6 C), temperature source Oral, resp. rate 18, height 5\' 2"  (1.575 m), weight 68 kg (150 lb), SpO2 99 %.  PHYSICAL EXAMINATION:  GENERAL:  61 y.o.-year-old patient lying in the bed with no acute distress.  EYES: Pupils equal, round, reactive to light and accommodation. No scleral icterus. Extraocular muscles intact.  HEENT: Head atraumatic, normocephalic. Oropharynx and nasopharynx clear.  NECK:  Supple, no jugular venous distention. No thyroid enlargement, no tenderness.  LUNGS: Normal breath sounds bilaterally, no wheezing, rales,rhonchi or crepitation. No use of accessory muscles of respiration.  CARDIOVASCULAR: S1, S2 normal. No murmurs, rubs, or gallops.  ABDOMEN: Soft, nontender, nondistended. Bowel sounds present. No organomegaly or mass.  EXTREMITIES: Patient has Right knee swelling.   NEUROLOGIC: Cranial nerves  II through XII are intact. Muscle strength 5/5 in all extremities. Sensation intact. Gait not checked.  PSYCHIATRIC: The patient is alert and oriented x 3.  SKIN: No obvious rash, lesion, or ulcer.    LABORATORY PANEL:   CBC  Recent Labs Lab 11/04/16 0350  WBC 6.5  HGB 11.3*  HCT 36.5  PLT 253   ------------------------------------------------------------------------------------------------------------------  Chemistries   Recent Labs Lab 11/07/16 0528  CREATININE 0.41*   ------------------------------------------------------------------------------------------------------------------  Cardiac Enzymes No results for input(s): TROPONINI in the last 168 hours. ------------------------------------------------------------------------------------------------------------------  RADIOLOGY:  No results found.  EKG:   Orders placed or performed during the hospital encounter of 10/31/16  . ED EKG  . ED EKG  . EKG 12-Lead  . EKG 12-Lead    ASSESSMENT AND PLAN:   #1. sinus tachycardia likely due to dehydration: The heart rate better now, received IV hydration, she feels better today.chest is negative for PE.does have breath, unable to lie down flat, according to her  this is a chronic problem. Echocardiogram shows EF 70%.     #2. right knee pain, swelling; ; continue knee immobilizer, continue physical therapy today also, patientwith husband who is also not able to support her at home,not safe discharge yet., According to nurse patient needing two  persons assist  Even  to get up from the bed.   Physical therapy, admitted the relaxant.  3, right upper lobe pulmonary nodule by CT and chest, repeat CT chest in 3 months as per recommendations. CT chest with contrast done yesterday showed right base atelectasis, left base atelectasis possibly developing pneumonia. 'continue empiric antibiotics, continue nebulizers, had hypoxia on ambulation with with  sats dropping to 83% on room  air while ambulating.. Patient also has pulmonary hypertension.  4. coronary atherosclerosis. Start patient on aspirin, small dose beta blockers, nitrates. Can have outpatient cardiology evaluation.  All the records are reviewed and case discussed with Care Management/Social Workerr. Management plans discussed with the patient, family and they are in agreement.  CODE STATUS: full  TOTAL TIME TAKING CARE OF THIS PATIENT: 35 minutes.   POSSIBLE D/C IN 1 DAYS, DEPENDING ON CLINICAL CONDITION.   Epifanio Lesches M.D on 11/08/2016 at 10:29 AM  Between 7am to 6pm - Pager - [phone]  After 6pm go to [email or web address] - password [email or web address] [facility]  Office  [phone]  CC: Primary care physician; Patient, [name]   Note: This dictation was prepared with Dragon dictation along with smaller phrase technology. Any transcriptional errors that result from this process are unintentional.

## 2016-11-08 NOTE — Progress Notes (Signed)
Rept to Dr. Vianne Bulls. Pt's BP this AM was 109/62 pulse 72. Per MD order will proceed with Lopressor and Imdur per MD order. Will continue to monitor.

## 2016-11-08 NOTE — Care Management (Signed)
Case discussed with dr. Jacqulynn Cadet sparks. He will sign home health orders until patient gets established with a physician. At this point patient will need PT, HHA and SW.

## 2016-11-09 MED ORDER — ISOSORBIDE MONONITRATE ER 30 MG PO TB24: 15.0000 mg | ORAL_TABLET | Freq: Every day | ORAL | 0 refills | Status: DC

## 2016-11-09 MED ORDER — PREDNISONE 10 MG (21) PO TBPK: ORAL_TABLET | ORAL | 0 refills | Status: DC

## 2016-11-09 NOTE — Progress Notes (Signed)
Pt refused knee immobilizer tonight. Pt stated she needed a "break" from it. No complains of pain so far. Will continue to monitor.

## 2016-11-09 NOTE — Progress Notes (Signed)
PT Cancellation Note  Patient Details Name: Kayla Boyer MRN: [id number] DOB: 1955-08-20   Cancelled Treatment:    Reason Eval/Treat Not Completed: Patient declined, no reason specified. Treatment attempted; pt refuses noting she is going home today (discharge note from MD present, but no current discharge order) and prefers to do therapy at home. Re attempt at a later date, it pt continues admitted.    Larae Grooms, PTA 11/09/2016, 12:04 PM

## 2016-11-09 NOTE — Discharge Planning (Signed)
Patient IV and tele removed.  Discharge papers given, explained and educated.  RN assessment and VS revealed stability for DC to home with [facility].  Scripts X2 e-scribed to [employer].  Patient ready for DC and EMS contacted to transport to patient home - awaiting arrival.

## 2016-11-09 NOTE — Care Management Note (Signed)
Case Management Note  Patient Details  Name: Kayla Boyer MRN: [id number] Date of Birth: July 26, 1955  Subjective/Objective: Discharging today.  Spoke with Dr. Vianne Bulls, updated her on home health needs and caregiver status for patient once she gets home. .Patient will qualify for hoyer lift, BSC and home O2 through charity program. Requested primary nurse place qualifying O2 sats. Patient updated and is agreeable to POC. Patient will be transported home by car.                 Action/Plan:   Patient suffers from emphysema and has trouble breathing at night when head is elevated less than 30 degrees. Bed wedges do not provide enough elevation to resolve breathing issues. Emphysema and knee pain cause patient to require frequent  changes in body position which cannot be achieved with a normal bed.       Expected Discharge Date:                  Expected Discharge Plan:  [place]  In-House Referral:     Discharge planning Services  CM Consult, Medication Assistance, [facility]  Post Acute Care Choice:  Durable Medical Equipment, Home Health Choice offered to:  Patient, Adult Children  DME Arranged:  Oxygen, Hospital bed, 3-N-1, Other see comment Optician, dispensing) DME Agency:  [place]:  RN, Nurse's Aide, Social Work [employer] Agency:  [place]  Status of Service:  Completed, signed off  If discussed at [employer], dates discussed:    Additional Comments:  Jolly Mango, RN 11/09/2016, 8:45 AM

## 2016-11-09 NOTE — Care Management (Signed)
Patient will not qualify for PT under [facility]. Will place SN, HHA and SW in the home. She has a wheelchair at home. Will assess if patient qualifies for hoyer lift or hospital bed under charity program as recommended by OT.

## 2016-11-09 NOTE — Discharge Planning (Signed)
Kayla Boyer, is a 61 y.o. female  DOB 11-17-1955  MRN [id number].  Admission date:  10/31/2016  Admitting Physician  Lance Coon, MD  Discharge Date:  11/09/2016   Primary MD  Patient, [name]  Recommendations for primary care physician for things to follow:   Discharged home, follow-up with Dr. Mack Guise in 2 weeks.   Admission Diagnosis  Weakness [R53.1] SOB (shortness of breath) [R06.02] Hypoxia [R09.02]   Discharge Diagnosis  Weakness [R53.1] SOB (shortness of breath) [R06.02] Hypoxia [R09.02]    Principal Problem:   Tachycardia Active Problems:   Fall   HLD (hyperlipidemia)      Past Medical History:  Diagnosis Date  . HLD (hyperlipidemia)     Past Surgical History:  Procedure Laterality Date  . NO PAST SURGERIES         History of present illness and  Hospital Course:     Kindly see H&P for history of present illness and admission details, please review complete Labs, Consult reports and Test reports for all details in brief  HPI  from the history and physical done on the day of admission 61 year old female patient came in because of weakness, fall. Admitted to telemetry because of tachycardia. Heart rate was 116 when she came.   Hospital Course  #1 sinus tachycardia secondary to pain. CT chest negative for PE. Admitted to telemetry. Patient had echocardiogram which showed EF 50 more than 50%. Tachycardia resolved. Troponins have been negative. Patient did not have chest pain.  #2 right knee swelling. Right knee CT showed multiple bilateral ligament injury. Seen by orthopedic Dr. Mack Guise he recommended MRI but patient could not lie down flat for MRI of the right knee some MRI of the right knee is not done. Dr. present. Recommended knee immobilizer when she gets out of the bed and also  ambulates. Physical therapy recommended skilled nursing but because patient lacks insurance she did not qualify for skilled nursing. Has history of previous progressive  decrease in strength in legs. Follow-up with ortho  as an outpatient in 2 weeks. She can use Tylenol for pain control. She refused narcotics. Can use Motrin also 200 mg every 6 hours as needed for pain. He has no pain at this time. Discharge home with home health skilled nursing, home health aide, social worker. Patient will not qualify for PT and the charity program as per case manager. Provide  hospital bed as recommended by OT.  #3. hyperlipidemia: Continue statins.  #4 emphysema, pulmonary hypertension CT Angela chest showed no PE. Patient continued to have shortness of breath, unable to lie down flat, CT chest is done with contrast to evaluate for lung mass, ET chest showed right basilar atelectasis and a left lower lobe airspace disease so we treated  empirically her with antibiotics for pneumonia  #5 hypoxia due to emphysema, pulmonary hypertension. Qualified for oxygen 1 L on ambulation. 6. Coronary atherosclerosis: Patient started on Imdur, metoprolol but blood pressure drop. Yesterday and she also felt dizzy so I stopped. Metoprolol and also Imdur, patient has no chest pain.  high risk for recurrent admission. Because patient is not ambulating much and needing two-person assistance but because she lacks insurance she could not go to skilled nursing  Discharge Condition:stable   Follow UP      Discharge Instructions  and  Discharge Medications    Discharge Instructions    Face-to-face encounter (required for Medicare/Medicaid patients)    Complete by:  As directed  I Epifanio Lesches certify that this patient is under my care and that I, or a nurse practitioner or physician's assistant working with me, had a face-to-face encounter that meets the physician face-to-face encounter requirements with this patient  on 11/09/2016. The encounter with the patient was in whole, or in part for the following medical condition(s) which is the primary reason for home health care (List medical condition Sob /hypoxia due to emphysema Right knee pain Ambulatory difficulty Treated pnumonia   The encounter with the patient was in whole, or in part, for the following medical condition, which is the primary reason for home health care:  whole   I certify that, based on my findings, the following services are medically necessary home health services:  Physical therapy   Reason for Medically Necessary Home Health Services:  Therapy- Instruction on use of Assistive Device for Ambulation on all Surfaces   My clinical findings support the need for the above services:  Unable to leave home safely without assistance and/or assistive device   Further, I certify that my clinical findings support that this patient is homebound due to:  Unable to leave home safely without assistance   Home Health    Complete by:  As directed    To provide the following care/treatments:   PT [place]       Allergies as of 11/09/2016   No Known Allergies     Medication List    TAKE these medications   aspirin EC 81 MG tablet Take 81 mg by mouth daily.   ferrous sulfate 325 (65 FE) MG tablet Take 325 mg by mouth daily with breakfast.   isosorbide mononitrate 30 MG 24 hr tablet Commonly known as:  IMDUR Take 0.5 tablets (15 mg total) by mouth daily.   omega-3 acid ethyl esters 1 g capsule Commonly known as:  LOVAZA Take 1 g by mouth daily.   predniSONE 10 MG (21) Tbpk tablet Commonly known as:  STERAPRED UNI-PAK 21 TAB Taper by 10 mg daily   vitamin B-12 1000 MCG tablet Commonly known as:  CYANOCOBALAMIN Take 1,000 mcg by mouth daily.   vitamin C 500 MG tablet Commonly known as:  ASCORBIC ACID Take 500 mg by mouth daily.         Diet and Activity recommendation: See Discharge Instructions above  She needs  to wear a right knee immobilizer on ambulation Consults obtained - ortho,PT,Casemanager   Major procedures and Radiology Reports - PLEASE review detailed and final reports for all details, in brief -     Dg Chest 1 View  Result Date: 11/03/2016 CLINICAL DATA:  Shortness of breath. Some distortion due to patient body habitus and kyphosis. EXAM: CHEST 1 VIEW COMPARISON:  10/31/2016 chest x-ray and chest CT FINDINGS: The heart is enlarged. Shallow lung inflation. There is bibasilar atelectasis. No frank consolidation or pulmonary edema. IMPRESSION: Bibasilar atelectasis.  Stable cardiomegaly. Electronically Signed   By: Nolon Nations M.D.   On: 11/03/2016 13:37   Dg Chest 2 View  Result Date: 10/31/2016 CLINICAL DATA:  Patient fell in the bathroom today. Shortness of breath. EXAM: CHEST  2 VIEW COMPARISON:  None. FINDINGS: Two views study shows mild asymmetric elevation left hemidiaphragm. No evidence for pneumothorax. No overt pulmonary edema. There is basilar atelectasis with probable tiny bilateral pleural effusions. Cardiopericardial silhouette is enlarged. Bones are diffusely demineralized without an acute rib fracture evident. IMPRESSION: Cardiomegaly with basilar atelectasis and probable tiny bilateral pleural effusions. Electronically Signed  By: Misty Stanley M.D.   On: 10/31/2016 19:57   Dg Knee 2 Views Right  Result Date: 10/31/2016 CLINICAL DATA:  Acute right knee pain following fall today. Initial encounter. EXAM: RIGHT KNEE - 1-2 VIEW COMPARISON:  None. FINDINGS: No evidence of fracture, dislocation, or joint effusion. No evidence of arthropathy or other focal bone abnormality. Soft tissues are unremarkable. IMPRESSION: Negative. Electronically Signed   By: Margarette Canada M.D.   On: 10/31/2016 19:57   Ct Chest W Contrast  Result Date: 11/05/2016 CLINICAL DATA:  Oxygen desaturation when lying supine. EXAM: CT CHEST WITH CONTRAST TECHNIQUE: Multidetector CT imaging of the chest was  performed during intravenous contrast administration. CONTRAST:  30mL ISOVUE-300 IOPAMIDOL (ISOVUE-300) INJECTION 61% COMPARISON:  Chest radiograph 11/03/2016.  CT 10/31/2016. FINDINGS: Cardiovascular: Poor IV opacification secondary to IV malfunction. Normal aortic caliber with tortuosity. Moderate cardiomegaly. Pulmonary artery enlargement. Lad coronary artery atherosclerosis. Aortic valve calcification. Mediastinum/Nodes: Limited evaluation for thoracic adenopathy. Lungs/Pleura: Trace left pleural fluid is new. Mild motion degradation throughout. The right upper lobe pulmonary nodule is more apparent on the prior exam secondary to better technique. Example image 38/series 3. Low lung volumes. Similar right base atelectasis dependently. Slight worsening in left lower lobe airspace disease, primarily posteriorly and inferiorly. Upper Abdomen: Normal imaged portions of the liver, spleen, stomach, pancreas, adrenal glands. Musculoskeletal: Accentuation of expected thoracic kyphosis. IMPRESSION: 1. Degraded exam secondary to motion and lack of IV contrast administration/opacification. 2. Similar right base atelectasis. Development of small left pleural effusion with worsened left lower lobe airspace disease. Primarily felt to represent atelectasis. Developing infection cannot be excluded. 3. Cardiomegaly. Age advanced coronary artery atherosclerosis. Recommend assessment of coronary risk factors and consideration of medical therapy. 4. Pulmonary artery enlargement suggests pulmonary arterial hypertension. 5. Aortic valvular calcifications. Consider echocardiography to evaluate for valvular dysfunction. 6. Right upper lobe pulmonary nodule is more apparent on the prior exam. PC 10/31/2016 report. Electronically Signed   By: Abigail Miyamoto M.D.   On: 11/05/2016 13:56   Ct Angio Chest Pe W Or Wo Contrast  Result Date: 10/31/2016 CLINICAL DATA:  Shortness of breath.  Fall in the bathroom. EXAM: CT ANGIOGRAPHY CHEST WITH  CONTRAST TECHNIQUE: Multidetector CT imaging of the chest was performed using the standard protocol during bolus administration of intravenous contrast. Multiplanar CT image reconstructions and MIPs were obtained to evaluate the vascular anatomy. CONTRAST:  75 cc Isovue 370 IV COMPARISON:  Chest radiograph earlier this day FINDINGS: Cardiovascular: No filling defects in the pulmonary arteries to the proximal segmental level, more distal branches cannot be assessed due to contrast bolus timing. Tortuous thoracic aorta without dissection or acute abnormality. Mild cardiomegaly. There are coronary artery calcifications. No pericardial effusion. Mediastinum/Nodes: Prominent lower paratracheal node measures 11 mm. No hilar adenopathy. Upper esophagus mildly patulous, distal esophagus decompressed. Visualized thyroid gland is normal. Lungs/Pleura: Lower lobe volume loss with atelectasis. There is an ill-defined 9 x 7 mm nodule the right upper lobe abutting the apical pulmonary artery, series 7, image 19. Trachea mainstem bronchi are patent. No definite pleural fluid. No pulmonary edema. Upper Abdomen: No acute abnormality. Musculoskeletal: There are no acute or suspicious osseous abnormalities. Degenerative change in the spine. Probable hemangioma within T8 vertebral body. Review of the MIP images confirms the above findings. IMPRESSION: 1. No central pulmonary embolus. 2. Minimal coronary artery calcifications.  Mild cardiomegaly. 3. Lower lobe volume loss with atelectasis. 4. Ill-defined 9 x 7 mm right upper lobe pulmonary nodule. Consider one of the following  in 3 months for both low-risk and high-risk individuals: (a) repeat chest CT, (b) follow-up PET-CT, or (c) tissue sampling. This recommendation follows the consensus statement: Guidelines for Management of Incidental Pulmonary Nodules Detected on CT Images: From the Fleischner Society 2017; Radiology 2017; 284:228-243. Given this nodule is adjacent to a pulmonary  artery, recommend either repeat chest CT or follow-up PET-CT over tissue sampling. Electronically Signed   By: Jeb Levering M.D.   On: 10/31/2016 21:03   Ct Knee Right Wo Contrast  Result Date: 11/01/2016 CLINICAL DATA:  Fall.  Knee swelling and pain. EXAM: CT OF THE right KNEE WITHOUT CONTRAST TECHNIQUE: Multidetector CT imaging of the right knee was performed according to the standard protocol. Multiplanar CT image reconstructions were also generated. COMPARISON:  Radiographs from 10/31/2016 FINDINGS: Bones/Joint/Cartilage Degenerative chondral thinning in the medial compartment causing medial compartmental articular space narrowing. Abnormal widening of the articular space in the lateral compartment, at 8 mm. This may imply a lateral ligamentous injury although the fibular collateral ligament is relatively intact in its visualized course (proximal extent indistinct). Marginal spurring in the patellofemoral joint. There is a small osteochondroma of the medial femoral condyles. Marginal spurring in the lateral compartment. Bony demineralization. Cortical indistinctness medially along the lateral femoral intercondylar notch as on image 44/6. Mild cortical articular surface irregularity anteromedially along the lateral tibial plateau, I favor spurring over a mild impaction fracture. Ligaments Suboptimally assessed by CT. Muscles and Tendons Severe muscular atrophy throughout the distal thigh and proximal calf. Soft tissues Moderate to large hemarthrosis. IMPRESSION: 1. Abnormal widening of the lateral compartment and 8 mm, suggesting possible ligamentous derangement laterally. Distally the fibular collateral ligament appears normal but proximally the fibular collateral ligament is not well seen. 2. There are irregularities along the lateral tibial plateau surface which are probably from spurring. Underlying bony demineralization reduces sensitivity for subtle impactions. 3. Severe diffuse regional muscular  atrophy. 4. Moderate to large hemarthrosis. 5. Degenerative spurring is present. Electronically Signed   By: Van Clines M.D.   On: 11/01/2016 14:54   Ct Hip Right Wo Contrast  Result Date: 10/31/2016 CLINICAL DATA:  Fall in the bathroom.  Right hip pain. EXAM: CT OF THE RIGHT HIP WITHOUT CONTRAST TECHNIQUE: Multidetector CT imaging of the right hip was performed according to the standard protocol. Multiplanar CT image reconstructions were also generated. COMPARISON:  Radiographs earlier this day. FINDINGS: Bones/Joint/Cartilage No acute fracture. Pubic rami and proximal femur are intact. Mild hip joint space narrowing and acetabular spurring. No hip joint effusion. Scattered regions is low-density in the proximal femur and acetabulum consistent with fatty marrow. Ligaments Suboptimally assessed by CT. Muscles and Tendons Mild fatty atrophy of the gluteal and upper thigh musculature. No intramuscular hematoma or fluid collection. Soft tissues No confluent hematoma.  No acute soft tissue abnormality. IMPRESSION: 1. No acute fracture of the right hip. 2. 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 Electronically Signed   By: Jeb Levering M.D.   On: 10/31/2016 21:10   Dg Hip Unilat W Or Wo Pelvis 2-3 Views Right  Result Date: 10/31/2016 CLINICAL DATA:  Acute right hip pain following fall today. Initial encounter. EXAM: DG HIP (WITH OR WITHOUT PELVIS) 2-3V RIGHT COMPARISON:  None. FINDINGS: No definite right hip fracture identified but the right hip position is asymmetric to the left, which has a more normal appearance. There is no evidence of dislocation. No suspicious focal bony lesions are present. IMPRESSION: No definite acute bony abnormality but the  position is asymmetric to the left which may be positional. Consider CT if there is strong clinical suspicion for hip fracture. Electronically Signed   By: Margarette Canada M.D.   On: 10/31/2016 20:01    Micro Results     Recent  Results (from the past 240 hour(s))  MRSA PCR Screening     Status: None   Collection Time: 11/07/16 10:06 AM  Result Value Ref Range Status   MRSA by PCR NEGATIVE NEGATIVE Final    Comment:        The GeneXpert MRSA Assay (FDA approved for NASAL specimens only), is one component of a comprehensive MRSA colonization surveillance program. It is not intended to diagnose MRSA infection nor to guide or monitor treatment for MRSA infections.        Today   Subjective:   Kayla Boyer today ,Will be discharged home.  Objective:   Blood pressure 109/70, pulse 84, temperature 97.9 F (36.6 C), temperature source Oral, resp. rate 19, height 5\' 2"  (1.575 m), weight 68 kg (150 lb), SpO2 91 %.   Intake/Output Summary (Last 24 hours) at 11/09/16 0856 Last data filed at 11/08/16 2212  Gross per 24 hour  Intake              560 ml  Output                0 ml  Net              560 ml    Exam Awake Alert, Oriented x 3, No new F.N deficits, Normal affect [place].AT,PERRAL Supple Neck,No JVD, No cervical lymphadenopathy appriciated.  Symmetrical Chest wall movement, Good air movement bilaterally, CTAB RRR,No Gallops,Rubs or new Murmurs, No Parasternal Heave +ve B.Sounds, Abd Soft, Non tender, No organomegaly appriciated, No rebound -guarding or rigidity. No Cyanosis, Clubbing or edema, No new Rash or bruise  Data Review   CBC w Diff: Lab Results  Component Value Date   WBC 6.5 11/04/2016   HGB 11.3 (L) 11/04/2016   HCT 36.5 11/04/2016   PLT 253 11/04/2016    CMP: Lab Results  Component Value Date   NA 142 11/01/2016   K 3.9 11/01/2016   CL 104 11/01/2016   CO2 33 (H) 11/01/2016   BUN 8 11/01/2016   CREATININE 0.41 (L) 11/07/2016  .   Total Time in preparing paper work, data evaluation and todays exam - 19 minutes  Tareq Dwan M.D on 11/09/2016 at 8:56 AM    Note: This dictation was prepared with Dragon dictation along with smaller phrase technology. Any  transcriptional errors that result from this process are unintentional.

## 2016-11-09 NOTE — Discharge Planning (Signed)
Patient SPO2 on RA at rest is 91% However, just rolling patient in bed caused patient SPO2 to lower to 86% on RA. SPO2 remained above 90% with activity on 1L . If discharged today, patient  should be discharged with O2 for activity.

## 2016-11-09 NOTE — Care Management (Signed)
Patient would like to transport home by EMS. Medical Necessity Completed

## 2016-11-09 NOTE — Progress Notes (Signed)
Occupational Therapy Treatment Patient Details Name: Kayla Boyer MRN: [id number] DOB: [birth year] Today's Date: 11/09/2016    History of present illness Pt. is a 61 y.o. female who was admitted to [facility] with Right knee pain following a fall sustained when her knee just gave out, SOB, tachycardia. Pt. became Hypoxic in the ED and was placed on 2L O2. PMHx includes: HLD.   OT comments  Pt seen for OT treatment this date. Pt asleep upon arrival, nasal canula displaced, neck flexed forward. Easily woken, pt required verbal cues to become aware that nasal canula was not in the right place and to correct this. Pt educated in importance of O2, symptoms when O2 sats were low (pt endorsed being very tired but alertness improved once [place] back on and with a few deep breaths). O2 sats 88% but quickly improved to 95-97% with nasal canula reapplied and educated in pursed lip breathing to support SOB. Pt educated in energy conservation strategies and falls prevention to support safety and functional independence in the home. Handout provided. Continue to progress goals.    Follow Up Recommendations  SNF    Equipment Recommendations       Recommendations for Other Services      Precautions / Restrictions Precautions Precautions: Fall Required Braces or Orthoses: Knee Immobilizer - Right Knee Immobilizer - Right: On when out of bed or walking Restrictions Weight Bearing Restrictions: Yes RLE Weight Bearing: Weight bearing as tolerated       Mobility Bed Mobility                  Transfers                      Balance                                           ADL either performed or assessed with clinical judgement   ADL Overall ADL's : Needs assistance/impaired Eating/Feeding: Set up;Bed level   Grooming: Set up;Bed level   Upper Body Bathing: Minimal assistance   Lower Body Bathing: Maximal assistance   Upper Body Dressing : Minimal assistance    Lower Body Dressing: Moderate assistance                       Vision Baseline Vision/History: No visual deficits Patient Visual Report: No change from baseline Vision Assessment?: No apparent visual deficits   Perception     Praxis      Cognition Arousal/Alertness: Awake/alert Behavior During Therapy: WFL for tasks assessed/performed Overall Cognitive Status: Within Functional Limits for tasks assessed                                          Exercises Other Exercises Other Exercises: pt educated in energy conservation strategies including home/routines modifications and falls prevention strategies to maximize safety and functional independence; handout provided, pt verbalizes understanding Other Exercises: pt educated briefly in basic home O2 management including keeping O2 away from heat sources, falls prevention strategies, managing oxygen tubing, pt verbalized understanding Other Exercises: pt educated in pursed lip breathing to support O2 sats, pt demo'd understanding   Shoulder Instructions       General Comments      Pertinent Vitals/ Pain  Pain Assessment: No/denies pain  Home Living                                          Prior Functioning/Environment              Frequency  Min 2X/week        Progress Toward Goals  OT Goals(current goals can now be found in the care plan section)  Progress towards OT goals: Progressing toward goals  Acute Rehab OT Goals Patient Stated Goal: To go to SNF OT Goal Formulation: With patient Potential to Achieve Goals: Good  Plan Discharge plan remains appropriate;Frequency remains appropriate    Co-evaluation                 AM-PAC PT "6 Clicks" Daily Activity     Outcome Measure   Help from another person eating meals?: None Help from another person taking care of personal grooming?: None Help from another person toileting, which includes using  toliet, bedpan, or urinal?: A Lot Help from another person bathing (including washing, rinsing, drying)?: A Lot Help from another person to put on and taking off regular upper body clothing?: A Little Help from another person to put on and taking off regular lower body clothing?: A Lot 6 Click Score: 17    End of Session    OT Visit Diagnosis: Muscle weakness (generalized) (M62.81)   Activity Tolerance Patient tolerated treatment well   Patient Left in bed;with call bell/phone within reach;with bed alarm set   Nurse Communication          Time: 8937-3428 OT Time Calculation (min): 10 min  Charges: OT General Charges $OT Visit: 1 Procedure OT Treatments $Self Care/Home Management : 8-22 mins  Jeni Salles, MPH, MS, OTR/L ascom [phone] 11/09/16, 2:45 PM

## 2016-11-10 NOTE — Discharge Summary (Signed)
Kayla Boyer, is a 61 y.o. female  DOB 10/30/1955  MRN [id number].  Admission date:  10/31/2016  Admitting Physician  Lance Coon, MD  Discharge Date:  11/09/2016   Primary MD  Patient, [name]  Recommendations for primary care physician for things to follow:   Discharged home, follow-up with Dr. Mack Guise in 2 weeks.   Admission Diagnosis  Weakness [R53.1] SOB (shortness of breath) [R06.02] Hypoxia [R09.02]   Discharge Diagnosis  Weakness [R53.1] SOB (shortness of breath) [R06.02] Hypoxia [R09.02]    Principal Problem:   Tachycardia Active Problems:   Fall   HLD (hyperlipidemia)      Past Medical History:  Diagnosis Date  . HLD (hyperlipidemia)     Past Surgical History:  Procedure Laterality Date  . NO PAST SURGERIES         History of present illness and  Hospital Course:     Kindly see H&P for history of present illness and admission details, please review complete Labs, Consult reports and Test reports for all details in brief  HPI  from the history and physical done on the day of admission 61 year old female patient came in because of weakness, fall. Admitted to telemetry because of tachycardia. Heart rate was 116 when she came.   Hospital Course  #1 sinus tachycardia secondary to pain. CT chest negative for PE. Admitted to telemetry. Patient had echocardiogram which showed EF 50 more than 50%. Tachycardia resolved. Troponins have been negative. Patient did not have chest pain.  #2 right knee swelling. Right knee CT showed multiple bilateral ligament injury. Seen by orthopedic Dr. Mack Guise he recommended MRI but patient could not lie down flat for MRI of the right knee some MRI of the right knee is not done. Dr. present. Recommended knee immobilizer when she gets out of the bed and also  ambulates. Physical therapy recommended skilled nursing but because patient lacks insurance she did not qualify for skilled nursing. Has history of previous progressive  decrease in strength in legs. Follow-up with ortho  as an outpatient in 2 weeks. She can use Tylenol for pain control. She refused narcotics. Can use Motrin also 200 mg every 6 hours as needed for pain. He has no pain at this time. Discharge home with home health skilled nursing, home health aide, social worker. Patient will not qualify for PT and the charity program as per case manager. Provide  hospital bed as recommended by OT.  #3. hyperlipidemia: Continue statins.  #4 emphysema, pulmonary hypertension CT Angela chest showed no PE. Patient continued to have shortness of breath, unable to lie down flat, CT chest is done with contrast to evaluate for lung mass, ET chest showed right basilar atelectasis and a left lower lobe airspace disease so we treated  empirically her with antibiotics for pneumonia  #5 hypoxia due to emphysema, pulmonary hypertension. Qualified for oxygen 1 L on ambulation. 6. Coronary atherosclerosis: Patient started on Imdur, metoprolol but blood pressure drop. Yesterday and she also felt dizzy so I stopped. Metoprolol and also Imdur, patient has no chest pain.  high risk for recurrent admission. Because patient is not ambulating much and needing two-person assistance but because she lacks insurance she could not go to skilled nursing  Discharge Condition:stable   Follow UP      Discharge Instructions  and  Discharge Medications    Discharge Instructions    Face-to-face encounter (required for Medicare/Medicaid patients)    Complete by:  As directed  I Epifanio Lesches certify that this patient is under my care and that I, or a nurse practitioner or physician's assistant working with me, had a face-to-face encounter that meets the physician face-to-face encounter requirements with this patient  on 11/09/2016. The encounter with the patient was in whole, or in part for the following medical condition(s) which is the primary reason for home health care (List medical condition Sob /hypoxia due to emphysema Right knee pain Ambulatory difficulty Treated pnumonia   The encounter with the patient was in whole, or in part, for the following medical condition, which is the primary reason for home health care:  whole   I certify that, based on my findings, the following services are medically necessary home health services:  Physical therapy   Reason for Medically Necessary Home Health Services:  Therapy- Instruction on use of Assistive Device for Ambulation on all Surfaces   My clinical findings support the need for the above services:  Unable to leave home safely without assistance and/or assistive device   Further, I certify that my clinical findings support that this patient is homebound due to:  Unable to leave home safely without assistance   Home Health    Complete by:  As directed    To provide the following care/treatments:   PT [place]       Allergies as of 11/09/2016   No Known Allergies     Medication List    TAKE these medications   aspirin EC 81 MG tablet Take 81 mg by mouth daily.   ferrous sulfate 325 (65 FE) MG tablet Take 325 mg by mouth daily with breakfast.   isosorbide mononitrate 30 MG 24 hr tablet Commonly known as:  IMDUR Take 0.5 tablets (15 mg total) by mouth daily.   omega-3 acid ethyl esters 1 g capsule Commonly known as:  LOVAZA Take 1 g by mouth daily.   predniSONE 10 MG (21) Tbpk tablet Commonly known as:  STERAPRED UNI-PAK 21 TAB Taper by 10 mg daily   vitamin B-12 1000 MCG tablet Commonly known as:  CYANOCOBALAMIN Take 1,000 mcg by mouth daily.   vitamin C 500 MG tablet Commonly known as:  ASCORBIC ACID Take 500 mg by mouth daily.         Diet and Activity recommendation: See Discharge Instructions above  She needs  to wear a right knee immobilizer on ambulation Consults obtained - ortho,PT,Cas[facility]ager   Major procedures and Radiology Reports - PLEASE review detailed and final reports for all details, in brief -     Dg Chest 1 View  Result Date: 11/03/2016 CLINICAL DATA:  Shortness of breath. Some distortion due to patient body habitus and kyphosis. EXAM: CHEST 1 VIEW COMPARISON:  10/31/2016 chest x-ray and chest CT FINDINGS: The heart is enlarged. Shallow lung inflation. There is bibasilar atelectasis. No frank consolidation or pulmonary edema. IMPRESSION: Bibasilar atelectasis.  Stable cardiomegaly. Electronically Signed   By: Nolon Nations M.D.   On: 11/03/2016 13:37   Dg Chest 2 View  Result Date: 10/31/2016 CLINICAL DATA:  Patient fell in the bathroom today. Shortness of breath. EXAM: CHEST  2 VIEW COMPARISON:  None. FINDINGS: Two views study shows mild asymmetric elevation left hemidiaphragm. No evidence for pneumothorax. No overt pulmonary edema. There is basilar atelectasis with probable tiny bilateral pleural effusions. Cardiopericardial silhouette is enlarged. Bones are diffusely demineralized without an acute rib fracture evident. IMPRESSION: Cardiomegaly with basilar atelectasis and probable tiny bilateral pleural effusions. Electronically Signed  By: Misty Stanley M.D.   On: 10/31/2016 19:57   Dg Knee 2 Views Right  Result Date: 10/31/2016 CLINICAL DATA:  Acute right knee pain following fall today. Initial encounter. EXAM: RIGHT KNEE - 1-2 VIEW COMPARISON:  None. FINDINGS: No evidence of fracture, dislocation, or joint effusion. No evidence of arthropathy or other focal bone abnormality. Soft tissues are unremarkable. IMPRESSION: Negative. Electronically Signed   By: Margarette Canada M.D.   On: 10/31/2016 19:57   Ct Chest W Contrast  Result Date: 11/05/2016 CLINICAL DATA:  Oxygen desaturation when lying supine. EXAM: CT CHEST WITH CONTRAST TECHNIQUE: Multidetector CT imaging of the chest was  performed during intravenous contrast administration. CONTRAST:  38mL ISOVUE-300 IOPAMIDOL (ISOVUE-300) INJECTION 61% COMPARISON:  Chest radiograph 11/03/2016.  CT 10/31/2016. FINDINGS: Cardiovascular: Poor IV opacification secondary to IV malfunction. Normal aortic caliber with tortuosity. Moderate cardiomegaly. Pulmonary artery enlargement. Lad coronary artery atherosclerosis. Aortic valve calcification. Mediastinum/Nodes: Limited evaluation for thoracic adenopathy. Lungs/Pleura: Trace left pleural fluid is new. Mild motion degradation throughout. The right upper lobe pulmonary nodule is more apparent on the prior exam secondary to better technique. Example image 38/series 3. Low lung volumes. Similar right base atelectasis dependently. Slight worsening in left lower lobe airspace disease, primarily posteriorly and inferiorly. Upper Abdomen: Normal imaged portions of the liver, spleen, stomach, pancreas, adrenal glands. Musculoskeletal: Accentuation of expected thoracic kyphosis. IMPRESSION: 1. Degraded exam secondary to motion and lack of IV contrast administration/opacification. 2. Similar right base atelectasis. Development of small left pleural effusion with worsened left lower lobe airspace disease. Primarily felt to represent atelectasis. Developing infection cannot be excluded. 3. Cardiomegaly. Age advanced coronary artery atherosclerosis. Recommend assessment of coronary risk factors and consideration of medical therapy. 4. Pulmonary artery enlargement suggests pulmonary arterial hypertension. 5. Aortic valvular calcifications. Consider echocardiography to evaluate for valvular dysfunction. 6. Right upper lobe pulmonary nodule is more apparent on the prior exam. PC 10/31/2016 report. Electronically Signed   By: Abigail Miyamoto M.D.   On: 11/05/2016 13:56   Ct Angio Chest Pe W Or Wo Contrast  Result Date: 10/31/2016 CLINICAL DATA:  Shortness of breath.  Fall in the bathroom. EXAM: CT ANGIOGRAPHY CHEST WITH  CONTRAST TECHNIQUE: Multidetector CT imaging of the chest was performed using the standard protocol during bolus administration of intravenous contrast. Multiplanar CT image reconstructions and MIPs were obtained to evaluate the vascular anatomy. CONTRAST:  75 cc Isovue 370 IV COMPARISON:  Chest radiograph earlier this day FINDINGS: Cardiovascular: No filling defects in the pulmonary arteries to the proximal segmental level, more distal branches cannot be assessed due to contrast bolus timing. Tortuous thoracic aorta without dissection or acute abnormality. Mild cardiomegaly. There are coronary artery calcifications. No pericardial effusion. Mediastinum/Nodes: Prominent lower paratracheal node measures 11 mm. No hilar adenopathy. Upper esophagus mildly patulous, distal esophagus decompressed. Visualized thyroid gland is normal. Lungs/Pleura: Lower lobe volume loss with atelectasis. There is an ill-defined 9 x 7 mm nodule the right upper lobe abutting the apical pulmonary artery, series 7, image 19. Trachea mainstem bronchi are patent. No definite pleural fluid. No pulmonary edema. Upper Abdomen: No acute abnormality. Musculoskeletal: There are no acute or suspicious osseous abnormalities. Degenerative change in the spine. Probable hemangioma within T8 vertebral body. Review of the MIP images confirms the above findings. IMPRESSION: 1. No central pulmonary embolus. 2. Minimal coronary artery calcifications.  Mild cardiomegaly. 3. Lower lobe volume loss with atelectasis. 4. Ill-defined 9 x 7 mm right upper lobe pulmonary nodule. Consider one of the following  in 3 months for both low-risk and high-risk individuals: (a) repeat chest CT, (b) follow-up PET-CT, or (c) tissue sampling. This recommendation follows the consensus statement: Guidelines for Management of Incidental Pulmonary Nodules Detected on CT Images: From the Fleischner Society 2017; Radiology 2017; 284:228-243. Given this nodule is adjacent to a pulmonary  artery, recommend either repeat chest CT or follow-up PET-CT over tissue sampling. Electronically Signed   By: Jeb Levering M.D.   On: 10/31/2016 21:03   Ct Knee Right Wo Contrast  Result Date: 11/01/2016 CLINICAL DATA:  Fall.  Knee swelling and pain. EXAM: CT OF THE right KNEE WITHOUT CONTRAST TECHNIQUE: Multidetector CT imaging of the right knee was performed according to the standard protocol. Multiplanar CT image reconstructions were also generated. COMPARISON:  Radiographs from 10/31/2016 FINDINGS: Bones/Joint/Cartilage Degenerative chondral thinning in the medial compartment causing medial compartmental articular space narrowing. Abnormal widening of the articular space in the lateral compartment, at 8 mm. This may imply a lateral ligamentous injury although the fibular collateral ligament is relatively intact in its visualized course (proximal extent indistinct). Marginal spurring in the patellofemoral joint. There is a small osteochondroma of the medial femoral condyles. Marginal spurring in the lateral compartment. Bony demineralization. Cortical indistinctness medially along the lateral femoral intercondylar notch as on image 44/6. Mild cortical articular surface irregularity anteromedially along the lateral tibial plateau, I favor spurring over a mild impaction fracture. Ligaments Suboptimally assessed by CT. Muscles and Tendons Severe muscular atrophy throughout the distal thigh and proximal calf. Soft tissues Moderate to large hemarthrosis. IMPRESSION: 1. Abnormal widening of the lateral compartment and 8 mm, suggesting possible ligamentous derangement laterally. Distally the fibular collateral ligament appears normal but proximally the fibular collateral ligament is not well seen. 2. There are irregularities along the lateral tibial plateau surface which are probably from spurring. Underlying bony demineralization reduces sensitivity for subtle impactions. 3. Severe diffuse regional muscular  atrophy. 4. Moderate to large hemarthrosis. 5. Degenerative spurring is present. Electronically Signed   By: Van Clines M.D.   On: 11/01/2016 14:54   Ct Hip Right Wo Contrast  Result Date: 10/31/2016 CLINICAL DATA:  Fall in the bathroom.  Right hip pain. EXAM: CT OF THE RIGHT HIP WITHOUT CONTRAST TECHNIQUE: Multidetector CT imaging of the right hip was performed according to the standard protocol. Multiplanar CT image reconstructions were also generated. COMPARISON:  Radiographs earlier this day. FINDINGS: Bones/Joint/Cartilage No acute fracture. Pubic rami and proximal femur are intact. Mild hip joint space narrowing and acetabular spurring. No hip joint effusion. Scattered regions is low-density in the proximal femur and acetabulum consistent with fatty marrow. Ligaments Suboptimally assessed by CT. Muscles and Tendons Mild fatty atrophy of the gluteal and upper thigh musculature. No intramuscular hematoma or fluid collection. Soft tissues No confluent hematoma.  No acute soft tissue abnormality. IMPRESSION: 1. No acute fracture of the right hip. 2. 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 Electronically Signed   By: Jeb Levering M.D.   On: 10/31/2016 21:10   Dg Hip Unilat W Or Wo Pelvis 2-3 Views Right  Result Date: 10/31/2016 CLINICAL DATA:  Acute right hip pain following fall today. Initial encounter. EXAM: DG HIP (WITH OR WITHOUT PELVIS) 2-3V RIGHT COMPARISON:  None. FINDINGS: No definite right hip fracture identified but the right hip position is asymmetric to the left, which has a more normal appearance. There is no evidence of dislocation. No suspicious focal bony lesions are present. IMPRESSION: No definite acute bony abnormality but the  position is asymmetric to the left which may be positional. Consider CT if there is strong clinical suspicion for hip fracture. Electronically Signed   By: Margarette Canada M.D.   On: 10/31/2016 20:01    Micro Results     Recent  Results (from the past 240 hour(s))  MRSA PCR Screening     Status: None   Collection Time: 11/07/16 10:06 AM  Result Value Ref Range Status   MRSA by PCR NEGATIVE NEGATIVE Final    Comment:        The GeneXpert MRSA Assay (FDA approved for NASAL specimens only), is one component of a comprehensive MRSA colonization surveillance program. It is not intended to diagnose MRSA infection nor to guide or monitor treatment for MRSA infections.        Today   Subjective:   Kayla Boyer today ,Will be discharged home.  Objective:   Blood pressure 118/82, pulse 85, temperature 98.7 F (37.1 C), temperature source Oral, resp. rate 18, height 5\' 2"  (1.575 m), weight 68 kg (150 lb), SpO2 94 %.  No intake or output data in the 24 hours ending 11/10/16 1622  Exam Awake Alert, Oriented x 3, No new F.N deficits, Normal affect [place].AT,PERRAL Supple Neck,No JVD, No cervical lymphadenopathy appriciated.  Symmetrical Chest wall movement, Good air movement bilaterally, CTAB RRR,No Gallops,Rubs or new Murmurs, No Parasternal Heave +ve B.Sounds, Abd Soft, Non tender, No organomegaly appriciated, No rebound -guarding or rigidity. No Cyanosis, Clubbing or edema, No new Rash or bruise  Data Review   CBC w Diff:  Lab Results  Component Value Date   WBC 6.5 11/04/2016   HGB 11.3 (L) 11/04/2016   HCT 36.5 11/04/2016   PLT 253 11/04/2016    CMP:  Lab Results  Component Value Date   NA 142 11/01/2016   K 3.9 11/01/2016   CL 104 11/01/2016   CO2 33 (H) 11/01/2016   BUN 8 11/01/2016   CREATININE 0.41 (L) 11/07/2016  .   Total Time in preparing paper work, data evaluation and todays exam - 34 minutes  Takiya Belmares M.D on 11/09/2016 at 4:22 PM    Note: This dictation was prepared with Dragon dictation along with smaller phrase technology. Any transcriptional errors that result from this process are unintentional.

## 2017-06-05 ENCOUNTER — Emergency Department: Payer: Medicaid Other

## 2017-06-05 ENCOUNTER — Inpatient Hospital Stay
Admission: EM | Admit: 2017-06-05 | Discharge: 2017-06-08 | DRG: 378 | Disposition: A | Discharge status: Home / Self Care | Payer: Medicaid Other | Attending: Internal Medicine | Admitting: Internal Medicine

## 2017-06-05 ENCOUNTER — Other Ambulatory Visit: Payer: Self-pay

## 2017-06-05 DIAGNOSIS — E785 Hyperlipidemia, unspecified: Secondary | ICD-10-CM | POA: Diagnosis present

## 2017-06-05 DIAGNOSIS — R195 Other fecal abnormalities: Secondary | ICD-10-CM | POA: Diagnosis present

## 2017-06-05 DIAGNOSIS — J449 Chronic obstructive pulmonary disease, unspecified: Secondary | ICD-10-CM | POA: Diagnosis present

## 2017-06-05 DIAGNOSIS — I10 Essential (primary) hypertension: Secondary | ICD-10-CM | POA: Diagnosis present

## 2017-06-05 DIAGNOSIS — Z7982 Long term (current) use of aspirin: Secondary | ICD-10-CM | POA: Diagnosis not present

## 2017-06-05 DIAGNOSIS — Z7401 Bed confinement status: Secondary | ICD-10-CM

## 2017-06-05 DIAGNOSIS — D649 Anemia, unspecified: Secondary | ICD-10-CM | POA: Diagnosis present

## 2017-06-05 DIAGNOSIS — D509 Iron deficiency anemia, unspecified: Secondary | ICD-10-CM | POA: Diagnosis present

## 2017-06-05 DIAGNOSIS — K922 Gastrointestinal hemorrhage, unspecified: Secondary | ICD-10-CM

## 2017-06-05 DIAGNOSIS — F1011 Alcohol abuse, in remission: Secondary | ICD-10-CM | POA: Diagnosis present

## 2017-06-05 DIAGNOSIS — K254 Chronic or unspecified gastric ulcer with hemorrhage: Secondary | ICD-10-CM | POA: Diagnosis present

## 2017-06-05 DIAGNOSIS — T39395A Adverse effect of other nonsteroidal anti-inflammatory drugs [NSAID], initial encounter: Secondary | ICD-10-CM | POA: Diagnosis present

## 2017-06-05 DIAGNOSIS — Z79899 Other long term (current) drug therapy: Secondary | ICD-10-CM

## 2017-06-05 DIAGNOSIS — J9611 Chronic respiratory failure with hypoxia: Secondary | ICD-10-CM | POA: Diagnosis present

## 2017-06-05 DIAGNOSIS — R531 Weakness: Secondary | ICD-10-CM | POA: Diagnosis present

## 2017-06-05 DIAGNOSIS — D62 Acute posthemorrhagic anemia: Secondary | ICD-10-CM

## 2017-06-05 DIAGNOSIS — H9209 Otalgia, unspecified ear: Secondary | ICD-10-CM | POA: Diagnosis present

## 2017-06-05 DIAGNOSIS — R0600 Dyspnea, unspecified: Secondary | ICD-10-CM

## 2017-06-05 LAB — HEMOGLOBIN AND HEMATOCRIT, BLOOD
HCT: 25.3 % — ABNORMAL LOW (ref 35.0–47.0)
Hemoglobin: 8.3 g/dL — ABNORMAL LOW (ref 12.0–16.0)

## 2017-06-05 LAB — CBC WITH DIFFERENTIAL/PLATELET
Band Neutrophils: 0 %
Basophils Absolute: 0.1 10*3/uL (ref 0–0.1)
Basophils Relative: 1 %
Blasts: 0 %
EOS PCT: 0 %
Eosinophils Absolute: 0 10*3/uL (ref 0–0.7)
HCT: 12.4 % — CL (ref 35.0–47.0)
Hemoglobin: 3.4 g/dL — CL (ref 12.0–16.0)
LYMPHS ABS: 1.1 10*3/uL (ref 1.0–3.6)
Lymphocytes Relative: 10 %
MCH: 19.6 pg — AB (ref 26.0–34.0)
MCHC: 27.6 g/dL — AB (ref 32.0–36.0)
MCV: 71 fL — AB (ref 80.0–100.0)
MONOS PCT: 4 %
Metamyelocytes Relative: 0 %
Monocytes Absolute: 0.4 10*3/uL (ref 0.2–0.9)
Myelocytes: 0 %
NRBC: 4 /100{WBCs} — AB
Neutro Abs: 9.1 10*3/uL — ABNORMAL HIGH (ref 1.4–6.5)
Neutrophils Relative %: 85 %
OTHER: 0 %
PLATELETS: 867 10*3/uL — AB (ref 150–440)
Promyelocytes Absolute: 0 %
RBC: 1.75 MIL/uL — AB (ref 3.80–5.20)
RDW: 23.2 % — AB (ref 11.5–14.5)
WBC: 10.7 10*3/uL (ref 3.6–11.0)

## 2017-06-05 LAB — URINALYSIS, COMPLETE (UACMP) WITH MICROSCOPIC
BILIRUBIN URINE: NEGATIVE
Glucose, UA: NEGATIVE mg/dL
Hgb urine dipstick: NEGATIVE
Ketones, ur: NEGATIVE mg/dL
NITRITE: NEGATIVE
PH: 7 (ref 5.0–8.0)
Protein, ur: NEGATIVE mg/dL
SPECIFIC GRAVITY, URINE: 1.011 (ref 1.005–1.030)

## 2017-06-05 LAB — COMPREHENSIVE METABOLIC PANEL
ALT: 9 U/L — AB (ref 14–54)
ANION GAP: 7 (ref 5–15)
AST: 16 U/L (ref 15–41)
Albumin: 3 g/dL — ABNORMAL LOW (ref 3.5–5.0)
Alkaline Phosphatase: 38 U/L (ref 38–126)
BUN: 15 mg/dL (ref 6–20)
CHLORIDE: 92 mmol/L — AB (ref 101–111)
CO2: 39 mmol/L — ABNORMAL HIGH (ref 22–32)
Calcium: 8.6 mg/dL — ABNORMAL LOW (ref 8.9–10.3)
Glucose, Bld: 166 mg/dL — ABNORMAL HIGH (ref 65–99)
POTASSIUM: 3.9 mmol/L (ref 3.5–5.1)
Sodium: 138 mmol/L (ref 135–145)
Total Bilirubin: 0.3 mg/dL (ref 0.3–1.2)
Total Protein: 6.4 g/dL — ABNORMAL LOW (ref 6.5–8.1)

## 2017-06-05 LAB — TROPONIN I

## 2017-06-05 LAB — HEMOGLOBIN: Hemoglobin: 7.6 g/dL — ABNORMAL LOW (ref 12.0–16.0)

## 2017-06-05 LAB — PREPARE RBC (CROSSMATCH)

## 2017-06-05 LAB — IRON AND TIBC
Iron: 70 ug/dL (ref 28–170)
Saturation Ratios: 16 % (ref 10.4–31.8)
TIBC: 439 ug/dL (ref 250–450)
UIBC: 369 ug/dL

## 2017-06-05 LAB — VITAMIN B12: Vitamin B-12: 3612 pg/mL — ABNORMAL HIGH (ref 180–914)

## 2017-06-05 LAB — PATHOLOGIST SMEAR REVIEW

## 2017-06-05 LAB — ABO/RH: ABO/RH(D): A POS

## 2017-06-05 LAB — RETICULOCYTES
RBC.: 3.13 MIL/uL — AB (ref 3.80–5.20)
RETIC CT PCT: 2.4 % (ref 0.4–3.1)
Retic Count, Absolute: 75.1 10*3/uL (ref 19.0–183.0)

## 2017-06-05 LAB — FERRITIN: FERRITIN: 4 ng/mL — AB (ref 11–307)

## 2017-06-05 LAB — FOLATE: FOLATE: 6.5 ng/mL (ref >5.9)

## 2017-06-05 LAB — LACTIC ACID, PLASMA: LACTIC ACID, VENOUS: 1.7 mmol/L (ref 0.5–1.9)

## 2017-06-05 MED ORDER — HYDROCODONE-ACETAMINOPHEN 5-325 MG PO TABS
1.0000 | ORAL_TABLET | ORAL | Status: DC | PRN
  Administered 2017-06-07: 1 via ORAL
  Filled 2017-06-05: qty 2

## 2017-06-05 MED ORDER — VITAMIN C 500 MG PO TABS
500.0000 mg | ORAL_TABLET | Freq: Every day | ORAL | Status: DC
  Administered 2017-06-06 – 2017-06-08 (×3): 500 mg via ORAL
  Filled 2017-06-05 (×4): qty 1

## 2017-06-05 MED ORDER — VITAMIN B-12 1000 MCG PO TABS
1000.0000 ug | ORAL_TABLET | Freq: Every day | ORAL | Status: DC
  Administered 2017-06-06: 1000 ug via ORAL
  Filled 2017-06-05 (×2): qty 1

## 2017-06-05 MED ORDER — HYDRALAZINE HCL 20 MG/ML IJ SOLN: 10.0000 mg | Freq: Four times a day (QID) | INTRAMUSCULAR | Status: DC | PRN

## 2017-06-05 MED ORDER — IOPAMIDOL (ISOVUE-300) INJECTION 61%
100.0000 mL | Freq: Once | INTRAVENOUS | Status: AC | PRN
Start: 2017-06-05 — End: 2017-06-05
  Administered 2017-06-05: 100 mL via INTRAVENOUS

## 2017-06-05 MED ORDER — ONDANSETRON HCL 4 MG/2ML IJ SOLN: 4.0000 mg | Freq: Four times a day (QID) | INTRAMUSCULAR | Status: DC | PRN

## 2017-06-05 MED ORDER — PANTOPRAZOLE SODIUM 40 MG IV SOLR
40.0000 mg | Freq: Two times a day (BID) | INTRAVENOUS | Status: DC
  Administered 2017-06-05 – 2017-06-06 (×2): 40 mg via INTRAVENOUS
  Filled 2017-06-05 (×2): qty 40

## 2017-06-05 MED ORDER — SODIUM CHLORIDE 0.9 % IV SOLN: 10.0000 mL/h | Freq: Once | INTRAVENOUS | Status: DC

## 2017-06-05 MED ORDER — ONDANSETRON HCL 4 MG PO TABS: 4.0000 mg | ORAL_TABLET | Freq: Four times a day (QID) | ORAL | Status: DC | PRN

## 2017-06-05 MED ORDER — POLYETHYLENE GLYCOL 3350 17 G PO PACK: 17.0000 g | PACK | Freq: Every day | ORAL | Status: DC | PRN

## 2017-06-05 MED ORDER — SODIUM CHLORIDE 0.9 % IV SOLN
Freq: Once | INTRAVENOUS | Status: AC
  Administered 2017-06-05: 16:00:00 via INTRAVENOUS

## 2017-06-05 MED ORDER — PANTOPRAZOLE SODIUM 40 MG IV SOLR
40.0000 mg | Freq: Once | INTRAVENOUS | Status: AC
  Administered 2017-06-05: 40 mg via INTRAVENOUS
  Filled 2017-06-05: qty 40

## 2017-06-05 MED ORDER — ACETAMINOPHEN 650 MG RE SUPP: 650.0000 mg | Freq: Four times a day (QID) | RECTAL | Status: DC | PRN

## 2017-06-05 MED ORDER — POLYETHYLENE GLYCOL 3350 17 G PO PACK
17.0000 g | PACK | Freq: Two times a day (BID) | ORAL | Status: DC
  Administered 2017-06-05 – 2017-06-08 (×5): 17 g via ORAL
  Filled 2017-06-05 (×5): qty 1

## 2017-06-05 MED ORDER — ACETAMINOPHEN 325 MG PO TABS: 650.0000 mg | ORAL_TABLET | Freq: Four times a day (QID) | ORAL | Status: DC | PRN

## 2017-06-05 MED ORDER — FERROUS SULFATE 325 (65 FE) MG PO TABS
325.0000 mg | ORAL_TABLET | Freq: Every day | ORAL | Status: DC
Start: 2017-06-06 — End: 2017-06-05

## 2017-06-05 NOTE — Progress Notes (Signed)
On-call Hosptialist paged for IVF orders; patient NPO after midnight for EGD with no IVF orders; awaiting callback. Barbaraann Faster, RN 8:26 PM 06/05/2017

## 2017-06-05 NOTE — ED Notes (Signed)
Pt given 2nd unit of emergency release blood.

## 2017-06-05 NOTE — Consult Note (Signed)
Kayla Darby, MD [address]  [place]  [place], [place] [address]  Main: [phone]  Fax: [phone] Pager: [phone]   Consultation  Referring Provider:     No ref. provider found Primary Care Physician:  Patient, [name] Primary Gastroenterologist:  None         Reason for Consultation:     Acute symptomatic anemia, dark stools  Date of Admission:  06/05/2017 Date of Consultation:  06/05/2017         HPI:   Kayla Boyer is a 62 y.o. African-American female with history of hypertension, presents with severe symptomatic anemia. Apparently, patient's sister noticed that she has been peel, very lethargic today who brought her to the emergency room. Patient has been short of breath and feeling weak for last several days. In the ER, she was found to have hemoglobin of 3.4. Previously, her hemoglobin was 14.4 when she got admitted to [facility] in 10/2016 for tachycardia. She had a 2-D echo at that time which revealed grade 1 diastolic dysfunction. She reports that she has been taking aspirin 325 mg daily to prevent blood clots. She denies having black stools or hematochezia. She denies having hematemesis, nausea. She reports having upper abdominal dull pain. She had CT A/P with contrast in the ER which did not reveal evidence of space-occupying lesion or obvious ulcer in the stomach or duodenum. Increased stool burden in the rectum. GI is consulted for further evaluation. When I saw her, patient received one unit of blood and received IV Protonix. She Was History of Alcohol Use, Consuming 1-3 Beers per Day.   NSAIDs: Aspirin 325 mg  Antiplts/Anticoagulants/Anti thrombotics: Aspirin 325 mg  GI Procedures: None Family history unknown Her sister and husband are bedside  Past Medical History:  Diagnosis Date  . HLD (hyperlipidemia)     Past Surgical History:  Procedure Laterality Date  . NO PAST SURGERIES      Prior to Admission medications    Medication Sig Start Date End Date Taking? Authorizing Provider  aspirin EC 81 MG tablet Take 81 mg by mouth daily.   Yes [provider]  ferrous sulfate 325 (65 FE) MG tablet Take 325 mg by mouth daily with breakfast.   Yes [provider]  vitamin B-12 (CYANOCOBALAMIN) 1000 MCG tablet Take 1,000 mcg by mouth daily.   Yes [provider]  vitamin C (ASCORBIC ACID) 500 MG tablet Take 500 mg by mouth daily.   Yes [provider]  isosorbide mononitrate (IMDUR) 30 MG 24 hr tablet Take 0.5 tablets (15 mg total) by mouth daily. Patient not taking: Reported on 06/05/2017 11/09/16   Epifanio Lesches, MD  predniSONE (STERAPRED UNI-PAK 21 TAB) 10 MG (21) TBPK tablet Taper by 10 mg daily Patient not taking: Reported on 06/05/2017 11/09/16   Epifanio Lesches, MD    Family History  Family history unknown: Yes     Social History   Tobacco Use  . Smoking status: Never Smoker  . Smokeless tobacco: Never Used  Substance Use Topics  . Alcohol use: Yes    Comment: Occ  . Drug use: No    Allergies as of 06/05/2017  . (No Known Allergies)    Review of Systems:    All systems reviewed and negative except where noted in HPI.   Physical Exam:  Vital signs in last 24 hours: Temp:  [98.1 F (36.7 C)-98.3 F (36.8 C)] 98.1 F (36.7 C) (02/04 1658)  Pulse Rate:  [106-123] 106 (02/04 1658) Resp:  [22-32] 22 (02/04 1418) BP: (93-164)/(55-103) 122/76 (02/04 1658) SpO2:  [97 %-100 %] 100 % (02/04 1658) Weight:  [150 lb (68 kg)] 150 lb (68 kg) (02/04 1552)   General:   Pleasant, cooperative in NAD, ill-appearing, pale Head:  Normocephalic and atraumatic. Eyes:   No icterus.   Conjunctiva pale. PERRLA. Ears:  Normal auditory acuity. Neck:  Supple; no masses or thyroidomegaly Lungs: Respirations even and unlabored. Lungs clear to auscultation bilaterally.   No wheezes, crackles, or rhonchi.  Heart:  Regular rate and rhythm;  Without murmur, clicks, rubs or  gallops Abdomen:  Soft, nondistended, mild epigastric tenderness. Normal bowel sounds. No appreciable masses or hepatomegaly.  No rebound or guarding.  Rectal:  Not performed. Msk:  Symmetrical without gross deformities.  Extremities:  Without edema, cyanosis or clubbing. Neurologic:  Alert and oriented x3;  grossly normal neurologically. Skin:  Intact without significant lesions or rashes. Psych:  Alert and cooperative. Normal affect.  LAB RESULTS: CBC Latest Ref Rng & Units 06/05/2017 11/04/2016 11/01/2016  WBC 3.6 - 11.0 K/uL 10.7 6.5 8.6  Hemoglobin 12.0 - 16.0 g/dL 3.4(LL) 11.3(L) 12.2  Hematocrit 35.0 - 47.0 % 12.4(LL) 36.5 38.9  Platelets 150 - 440 K/uL 867(H) 253 209    BMET BMP Latest Ref Rng & Units 06/05/2017 11/07/2016 11/04/2016  Glucose 65 - 99 mg/dL 166(H) - -  BUN 6 - 20 mg/dL 15 - -  Creatinine 0.44 - 1.00 mg/dL <0.30(L) 0.41(L) <0.30(L)  Sodium 135 - 145 mmol/L 138 - -  Potassium 3.5 - 5.1 mmol/L 3.9 - -  Chloride 101 - 111 mmol/L 92(L) - -  CO2 22 - 32 mmol/L 39(H) - -  Calcium 8.9 - 10.3 mg/dL 8.6(L) - -    LFT Hepatic Function Latest Ref Rng & Units 06/05/2017  Total Protein 6.5 - 8.1 g/dL 6.4(L)  Albumin 3.5 - 5.0 g/dL 3.0(L)  AST 15 - 41 U/L 16  ALT 14 - 54 U/L 9(L)  Alk Phosphatase 38 - 126 U/L 38  Total Bilirubin 0.3 - 1.2 mg/dL 0.3     STUDIES: Dg Chest 1 View  Result Date: 06/05/2017 CLINICAL DATA:  Several day history of shortness of breath. Chronic upper abdominal pain. EXAM: CHEST 1 VIEW COMPARISON:  CT scan chest of November 05, 2016 FINDINGS: The lungs are adequately inflated. There is blunting of the costophrenic angles bilaterally which is not a new finding. The retrocardiac region is dense. The cardiac silhouette is enlarged. The pulmonary vascularity is normal. The mediastinum is normal in width. There is multilevel degenerative disc disease of the thoracic spine. IMPRESSION: Small bilateral pleural effusions. Left lower lobe atelectasis or pneumonia. No  pulmonary edema. Stable cardiomegaly. Followup PA and lateral chest X-ray is recommended in 3-4 weeks following trial of antibiotic therapy to ensure resolution and exclude underlying malignancy. Electronically Signed   By: David  Martinique M.D.   On: 06/05/2017 14:02   Ct Abdomen Pelvis W Contrast  Result Date: 06/05/2017 CLINICAL DATA:  Chronic upper abdominal pain. Low hemoglobin and hematocrit. EXAM: CT ABDOMEN AND PELVIS WITH CONTRAST TECHNIQUE: Multidetector CT imaging of the abdomen and pelvis was performed using the standard protocol following bolus administration of intravenous contrast. CONTRAST:  172m ISOVUE-300 IOPAMIDOL (ISOVUE-300) INJECTION 61% COMPARISON:  None. FINDINGS: Lower chest: Again noted is left lower lobe atelectasis and volume loss. Hepatobiliary: No focal liver abnormality. Multiple stones within the gallbladder are identified which measure up to 1.6 cm.  No biliary dilatation. Pancreas: Unremarkable. No pancreatic ductal dilatation or surrounding inflammatory changes. Spleen: Normal in size without focal abnormality. Adrenals/Urinary Tract: The adrenal glands appear normal. Unremarkable appearance of both kidneys. The urinary bladder is normal. Stomach/Bowel: Normal appearance of the stomach. No abnormal small bowel wall thickening or inflammation. No abnormal small bowel dilatation. The appendix is visualized and appears normal. Large stool burden is identified within the rectum. Vascular/Lymphatic: Aortic atherosclerosis. No aneurysm. No upper abdominal or pelvic adenopathy. No inguinal adenopathy. Reproductive: Retroverted uterus.  No adnexal mass. Other: No free fluid or fluid collections within the abdomen or pelvis. 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 No aggressive lytic or sclerotic bone lesions. IMPRESSION: 1. No acute findings within the abdomen or pelvis. 2. Large stool burden identified within the rectum. Correlate for any clinical  signs or symptoms of constipation. 3. Multiple stones identified within the gallbladder. 4.  Aortic Atherosclerosis (ICD10-I70.0). 5. Chronic volume loss and atelectasis noted within the left base. Electronically Signed   By: Kerby Moors M.D.   On: 06/05/2017 15:24      Impression / Plan:   Kayla Boyer is a 62 y.o. female with history of hypertension, sinus tachycardia presents with severe symptomatic anemia in the absence of active GI bleed, history of aspirin 325 mg daily long-term. Hemoglobin 3.1, MCV 71. There is no evidence of cirrhosis. Differentials include GI malignancy or peptic ulcer disease or AVMs or NSAID-induced erosions  - Resuscitation to achieve hemoglobin about 7 - Okay with clear liquid diet - Nothing by mouth past midnight - Continue Protonix 40 mg IV twice a day - Check ferritin, B12, iron, TIBC, folate - Plan to perform EGD tomorrow, if negative we will discuss with patient about colonoscopy +/- VCE  Thank you for involving me in the care of this patient.      LOS: 0 days   Sherri Sear, MD  06/05/2017, 5:07 PM   Note: This dictation was prepared with Dragon dictation along with smaller phrase technology. Any transcriptional errors that result from this process are unintentional.

## 2017-06-05 NOTE — ED Notes (Signed)
First unit emergency release blood given. Velta Addison, RN witnessed.   Unit number: [id number] P

## 2017-06-05 NOTE — ED Provider Notes (Addendum)
[facility] Emergency Department Provider Note       Time seen: ----------------------------------------- 2:00 PM on 06/05/2017 -----------------------------------------   I have reviewed the triage vital signs and the nursing notes.  HISTORY   Chief Complaint Shortness of Breath    HPI Kayla Boyer is a 62 y.o. female with a history of hyperlipidemia, tachycardia who presents to the ED for shortness of breath for several days that is worsened today.  She also has chronic upper abdominal pain according to the family.  She arrives short of breath and tachycardic.  She denies fevers, chills, chest pain, vomiting or diarrhea.  Patient denies any blood in her stools or black tarry stools.  Past Medical History:  Diagnosis Date  . HLD (hyperlipidemia)     Patient Active Problem List   Diagnosis Date Noted  . Fall 10/31/2016  . Tachycardia 10/31/2016  . HLD (hyperlipidemia) 10/31/2016    Past Surgical History:  Procedure Laterality Date  . NO PAST SURGERIES      Allergies Patient has no known allergies.  Social History Social History   Tobacco Use  . Smoking status: Never Smoker  . Smokeless tobacco: Never Used  Substance Use Topics  . Alcohol use: Yes    Comment: Occ  . Drug use: No    Review of Systems Constitutional: Negative for fever. Cardiovascular: Negative for chest pain. Respiratory: Positive for shortness of breath Gastrointestinal: Negative for abdominal pain, vomiting and diarrhea. Genitourinary: Negative for dysuria. Musculoskeletal: Negative for back pain. Skin: Negative for rash. Neurological: Negative for headaches, focal weakness or numbness.  All systems negative/normal/unremarkable except as stated in the HPI  ____________________________________________   PHYSICAL EXAM:  VITAL SIGNS: ED Triage Vitals  Enc Vitals Group     BP --      Pulse Rate 06/05/17 1317 (!) 123     Resp 06/05/17 1317 (!) 32     Temp  06/05/17 1317 98.2 F (36.8 C)     Temp src --      SpO2 06/05/17 1317 97 %     Weight 06/05/17 1318 150 lb (68 kg)     Height --      Head Circumference --      Peak Flow --      Pain Score 06/05/17 1317 5     Pain Loc --      Pain Edu? --      Excl. in [place]? --     Constitutional: Alert, critically ill-appearing, moderate distress  Eyes: Conjunctivae are pale. Normal extraocular movements. ENT   Head: Normocephalic and atraumatic.   Nose: No congestion/rhinnorhea.   Mouth/Throat: Mucous membranes are moist.   Neck: No stridor. Cardiovascular: Rapid rate, regular rhythm. No murmurs, rubs, or gallops. Respiratory: Normal respiratory effort without tachypnea nor retractions. Breath sounds are clear and equal bilaterally. No wheezes/rales/rhonchi. Gastrointestinal: Mild epigastric tenderness, no rebound or guarding.  Normal bowel sounds. Musculoskeletal: Nontender with normal range of motion in extremities. No lower extremity tenderness nor edema. Neurologic:  Normal speech and language. No gross focal neurologic deficits are appreciated.  Skin:  Skin is warm, dry and intact.  Pallor is noted Psychiatric: Mood and affect are normal. Speech and behavior are normal.  ____________________________________________  EKG: Interpreted by me.  Sinus tachycardia with a rate of 115 bpm, LVH, leftward axis, normal QT.  ____________________________________________  ED COURSE:  As part of my medical decision making, I reviewed the following data within the [place] History obtained  from family if available, nursing notes, old chart and ekg, as well as notes from prior ED visits. Patient presented for dyspnea who appears profoundly anemic, we will assess with labs and imaging as indicated at this time. Clinical Course as of Jun 05 1542  Mon Jun 05, 2017  1426 Patient rectally does have heme positive stool  [JW]  1543 Patient was discussed with gastroenterology who  will see the patient in the ED  [JW]    Clinical Course User Index [JW] Earleen Newport, MD   Procedures ____________________________________________   LABS (pertinent positives/negatives)  Labs Reviewed  CBC WITH DIFFERENTIAL/PLATELET - Abnormal; Notable for the following components:      Result Value   RBC 1.75 (*)    Hemoglobin 3.4 (*)    HCT 12.4 (*)    MCV 71.0 (*)    MCH 19.6 (*)    MCHC 27.6 (*)    RDW 23.2 (*)    Platelets 867 (*)    All other components within normal limits  COMPREHENSIVE METABOLIC PANEL - Abnormal; Notable for the following components:   Chloride 92 (*)    CO2 39 (*)    Glucose, Bld 166 (*)    Creatinine, Ser <0.30 (*)    Calcium 8.6 (*)    Total Protein 6.4 (*)    Albumin 3.0 (*)    ALT 9 (*)    All other components within normal limits  URINALYSIS, COMPLETE (UACMP) WITH MICROSCOPIC - Abnormal; Notable for the following components:   Color, Urine YELLOW (*)    APPearance CLEAR (*)    Leukocytes, UA TRACE (*)    Bacteria, UA RARE (*)    Squamous Epithelial / LPF 0-5 (*)    All other components within normal limits  CULTURE, BLOOD (SINGLE) W REFLEX TO ID PANEL  CULTURE, BLOOD (SINGLE) W REFLEX TO ID PANEL  TROPONIN I  LACTIC ACID, PLASMA  CBG MONITORING, ED  TYPE AND SCREEN  PREPARE RBC (CROSSMATCH)   CRITICAL CARE Performed by: Laurence Aly   Total critical care time: 30 minutes  Critical care time was exclusive of separately billable procedures and treating other patients.  Critical care was necessary to treat or prevent imminent or life-threatening deterioration.  Critical care was time spent personally by me on the following activities: development of treatment plan with patient and/or surrogate as well as nursing, discussions with consultants, evaluation of patient's response to treatment, examination of patient, obtaining history from patient or surrogate, ordering and performing treatments and interventions,  ordering and review of laboratory studies, ordering and review of radiographic studies, pulse oximetry and re-evaluation of patient's condition.  RADIOLOGY Images were viewed by me  Chest x-ray reveals bibasilar effusions CT of the abdomen and pelvis with IV contrast Did not reveal any acute process ____________________________________________  DIFFERENTIAL DIAGNOSIS   Anemia, PE, pneumonia, pneumothorax, dehydration, electrolyte abnormally, arrhythmia  FINAL ASSESSMENT AND PLAN  Dyspnea, severe anemia, GI bleeding   Plan: Patient had presented for dyspnea and looked profoundly anemic. Patient's labs did confirm she was anemic with a hemoglobin of 3.4. Patient's imaging feels small bilateral pleural effusions.  Do not think she has active pneumonia, this is likely atelectasis.  We did start her on emergency release blood and she was guaiac positive rectally.  Have ordered IV Protonix as well as an additional unit of packed red blood cells.  CT was negative, she is much improved after blood and I will give her an additional unit as  well.  I will discussed with the hospitalist for admission.   Laurence Aly, MD   Note: This note was generated in part or whole with voice recognition software. Voice recognition is usually quite accurate but there are transcription errors that can and very often do occur. I apologize for any typographical errors that were not detected and corrected.     Earleen Newport, MD 06/05/17 1527    Earleen Newport, MD 06/05/17 [phone]

## 2017-06-05 NOTE — Progress Notes (Signed)
2100: Dr. Duane Boston called and notified of no IVF orders on patient that will be NPO after midnight; acknowledged; no new orders; "..Don't want to overload her..." Barbaraann Faster, RN; 10:39 PM 06/05/2017

## 2017-06-05 NOTE — ED Triage Notes (Signed)
Pt c/o shortness of breath for several days and chronic upper abd pain

## 2017-06-05 NOTE — H&P (Signed)
[facility] [place] NAME: Kayla Boyer    MR#:  [id number]  DATE OF BIRTH:  1956-02-02  DATE OF ADMISSION:  06/05/2017  PRIMARY CARE PHYSICIAN: Patient, [name]   REQUESTING/REFERRING PHYSICIAN:dr williams  CHIEF COMPLAINT:   SOB and weakness HISTORY OF PRESENT ILLNESS:  Kayla Boyer  is a 62 y.o. female with a known history of essential hypertension who presents today due to shortness breath and weakness. Patient's sister was concerned about the patient this morning as she looked very weak and lethargic. When she arrived to the emergency room she was very pale. Her hemoglobin was found to be 3.4.  She had an emergent blood transfusion and she reports that she is feeling 100% better. She has noticed dark-colored stools over the past few days. She denies any abdominal pain. She was guaiac positive in the emergency room. ED physician has already spoken with GI physician. Patient has a history of EtOH abuse in the past. Her last drink was in December. She would drink anywhere between 1-3 beers a day. And aspirin on a daily basis. PAST MEDICAL HISTORY:   Past Medical History:  Diagnosis Date  . HLD (hyperlipidemia)     PAST SURGICAL HISTORY:   Past Surgical History:  Procedure Laterality Date  . NO PAST SURGERIES      SOCIAL HISTORY:   Social History   Tobacco Use  . Smoking status: Never Smoker  . Smokeless tobacco: Never Used  Substance Use Topics  . Alcohol use: Yes    Comment: Occ    FAMILY HISTORY:   Family History  Family history unknown: Yes    DRUG ALLERGIES:  No Known Allergies  REVIEW OF SYSTEMS:   Review of Systems  Constitutional: Positive for malaise/fatigue. Negative for chills, fever and weight loss.  HENT: Negative.  Negative for ear discharge, ear pain, hearing loss, nosebleeds and sore throat.   Eyes: Negative.  Negative for blurred vision and pain.  Respiratory: Positive for shortness of breath.  Negative for cough, hemoptysis and wheezing.   Cardiovascular: Negative.  Negative for chest pain, palpitations and leg swelling.  Gastrointestinal: Negative.  Negative for abdominal pain, blood in stool, diarrhea, nausea and vomiting.  Genitourinary: Negative.  Negative for dysuria.  Musculoskeletal: Negative.  Negative for back pain.  Skin: Negative.   Neurological: Positive for weakness. Negative for dizziness, tremors, speech change, focal weakness, seizures and headaches.  Endo/Heme/Allergies: Negative.  Does not bruise/bleed easily.  Psychiatric/Behavioral: Negative.  Negative for depression, hallucinations and suicidal ideas.    MEDICATIONS AT HOME:   Prior to Admission medications   Medication Sig Start Date End Date Taking? Authorizing Provider  aspirin EC 81 MG tablet Take 81 mg by mouth daily.    [provider]  ferrous sulfate 325 (65 FE) MG tablet Take 325 mg by mouth daily with breakfast.    [provider]  isosorbide mononitrate (IMDUR) 30 MG 24 hr tablet Take 0.5 tablets (15 mg total) by mouth daily. Patient not taking: Reported on 06/05/2017 11/09/16   Epifanio Lesches, MD  omega-3 acid ethyl esters (LOVAZA) 1 g capsule Take 1 g by mouth daily.    [provider]  predniSONE (STERAPRED UNI-PAK 21 TAB) 10 MG (21) TBPK tablet Taper by 10 mg daily Patient not taking: Reported on 06/05/2017 11/09/16   Epifanio Lesches, MD  vitamin B-12 (CYANOCOBALAMIN) 1000 MCG tablet Take 1,000 mcg by mouth daily.    [provider]  vitamin C (ASCORBIC ACID) 500 MG tablet Take 500 mg by mouth daily.    [provider]      VITAL SIGNS:  Blood pressure (!) 164/92, pulse (!) 113, temperature 98.3 F (36.8 C), temperature source Oral, resp. rate (!) 22, weight 68 kg (150 lb), SpO2 100 %.  PHYSICAL EXAMINATION:   Physical Exam  Constitutional: She is oriented to person, place, and time and well-developed, well-nourished, and in no distress.  No distress.  HENT:  Head: Normocephalic.  Eyes: No scleral icterus.  Neck: Normal range of motion. Neck supple. No JVD present. No tracheal deviation present.  Cardiovascular: Normal rate and regular rhythm. Exam reveals no gallop and no friction rub.  Murmur heard. Pulmonary/Chest: Effort normal and breath sounds normal. No respiratory distress. She has no wheezes. She has no rales. She exhibits no tenderness.  Abdominal: Soft. Bowel sounds are normal. She exhibits no distension and no mass. There is no tenderness. There is no rebound and no guarding.  Musculoskeletal: Normal range of motion. She exhibits no edema.  Neurological: She is alert and oriented to person, place, and time.  Skin: Skin is warm. No rash noted. She is not diaphoretic. No erythema.  Psychiatric: Affect and judgment normal.      LABORATORY PANEL:   CBC Recent Labs  Lab 06/05/17 1313  WBC 10.7  HGB 3.4*  HCT 12.4*  PLT 867*   ------------------------------------------------------------------------------------------------------------------  Chemistries  Recent Labs  Lab 06/05/17 1313  NA 138  K 3.9  CL 92*  CO2 39*  GLUCOSE 166*  BUN 15  CREATININE <0.30*  CALCIUM 8.6*  AST 16  ALT 9*  ALKPHOS 38  BILITOT 0.3   ------------------------------------------------------------------------------------------------------------------  Cardiac Enzymes Recent Labs  Lab 06/05/17 1313  TROPONINI <0.03   ------------------------------------------------------------------------------------------------------------------  RADIOLOGY:  Dg Chest 1 View  Result Date: 06/05/2017 CLINICAL DATA:  Several day history of shortness of breath. Chronic upper abdominal pain. EXAM: CHEST 1 VIEW COMPARISON:  CT scan chest of November 05, 2016 FINDINGS: The lungs are adequately inflated. There is blunting of the costophrenic angles bilaterally which is not a new finding. The retrocardiac region is dense. The cardiac  silhouette is enlarged. The pulmonary vascularity is normal. The mediastinum is normal in width. There is multilevel degenerative disc disease of the thoracic spine. IMPRESSION: Small bilateral pleural effusions. Left lower lobe atelectasis or pneumonia. No pulmonary edema. Stable cardiomegaly. Followup PA and lateral chest X-ray is recommended in 3-4 weeks following trial of antibiotic therapy to ensure resolution and exclude underlying malignancy. Electronically Signed   By: David  Martinique M.D.   On: 06/05/2017 14:02   Ct Abdomen Pelvis W Contrast  Result Date: 06/05/2017 CLINICAL DATA:  Chronic upper abdominal pain. Low hemoglobin and hematocrit. EXAM: CT ABDOMEN AND PELVIS WITH CONTRAST TECHNIQUE: Multidetector CT imaging of the abdomen and pelvis was performed using the standard protocol following bolus administration of intravenous contrast. CONTRAST:  167mL ISOVUE-300 IOPAMIDOL (ISOVUE-300) INJECTION 61% COMPARISON:  None. FINDINGS: Lower chest: Again noted is left lower lobe atelectasis and volume loss. Hepatobiliary: No focal liver abnormality. Multiple stones within the gallbladder are identified which measure up to 1.6 cm. No biliary dilatation. Pancreas: Unremarkable. No pancreatic ductal dilatation or surrounding inflammatory changes. Spleen: Normal in size without focal abnormality. Adrenals/Urinary Tract: The adrenal glands appear normal. Unremarkable appearance of both kidneys. The urinary bladder is normal. Stomach/Bowel: Normal appearance of the stomach. No abnormal small bowel wall thickening or inflammation. No abnormal small bowel dilatation. The appendix  is visualized and appears normal. Large stool burden is identified within the rectum. Vascular/Lymphatic: Aortic atherosclerosis. No aneurysm. No upper abdominal or pelvic adenopathy. No inguinal adenopathy. Reproductive: Retroverted uterus.  No adnexal mass. Other: No free fluid or fluid collections within the abdomen or pelvis.  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 No aggressive lytic or sclerotic bone lesions. IMPRESSION: 1. No acute findings within the abdomen or pelvis. 2. Large stool burden identified within the rectum. Correlate for any clinical signs or symptoms of constipation. 3. Multiple stones identified within the gallbladder. 4.  Aortic Atherosclerosis (ICD10-I70.0). 5. Chronic volume loss and atelectasis noted within the left base. Electronically Signed   By: Kerby Moors M.D.   On: 06/05/2017 15:24    EKG:   Sinus tachycardia no ST depression or elevation  IMPRESSION AND PLAN:   62 year old female with history of chronic hypoxic respiratory failure on 2 L of oxygen essential hypertension who presents with shortness of breath and weakness.  1. Acute microcytic anemia with guaiac positive stool Continue blood transfusion. She has received 1 unit will transfuse another unit Hemoglobin every 6 hours Continue PPI IV Check consultation requested and case discussed with Dr. Marius Ditch Anemia panel ordered Patient will need EGD likely in a.m. and therefore patient will be nothing by mouth after midnight  stop aspirin  2.Essential hypertension: Continue isosorbide  3. Chronic hypoxic respiratory failure: Continue O2  All the records are reviewed and case discussed with ED provider. Management plans discussed with the patient and she is  in agreement  CODE STATUS: FULL  Critical care TOTAL TIME TAKING CARE OF THIS PATIENT: 45 minutes.  Due to low hemoglobin patient high risk for cardiac decompensation  Alzora Ha M.D on 06/05/2017 at 3:40 PM  Between 7am to 6pm - Pager - [phone]  After 6pm go to [email or web address] - password [email or web address] [facility]  Office  [phone]  CC: Primary care physician; Patient, [name]

## 2017-06-06 ENCOUNTER — Encounter
Admission: EM | Disposition: A | Discharge status: Home / Self Care | Payer: Self-pay | Source: Home / Self Care | Attending: Internal Medicine

## 2017-06-06 ENCOUNTER — Inpatient Hospital Stay: Payer: Medicaid Other | Admitting: Anesthesiology

## 2017-06-06 DIAGNOSIS — D500 Iron deficiency anemia secondary to blood loss (chronic): Secondary | ICD-10-CM

## 2017-06-06 DIAGNOSIS — K259 Gastric ulcer, unspecified as acute or chronic, without hemorrhage or perforation: Secondary | ICD-10-CM

## 2017-06-06 LAB — HEMOGLOBIN
HEMOGLOBIN: 7.6 g/dL — AB (ref 12.0–16.0)
Hemoglobin: 7.9 g/dL — ABNORMAL LOW (ref 12.0–16.0)
Hemoglobin: 7.6 g/dL — ABNORMAL LOW (ref 12.0–16.0)
Hemoglobin: 7.5 g/dL — ABNORMAL LOW (ref 12.0–16.0)

## 2017-06-06 SURGERY — EGD (ESOPHAGOGASTRODUODENOSCOPY)
Anesthesia: General

## 2017-06-06 MED ORDER — FENTANYL CITRATE (PF) 100 MCG/2ML IJ SOLN
INTRAMUSCULAR | Status: AC
  Filled 2017-06-06: qty 2

## 2017-06-06 MED ORDER — LIDOCAINE HCL (CARDIAC) 20 MG/ML IV SOLN
INTRAVENOUS | Status: DC | PRN
  Administered 2017-06-06: 80 mg via INTRAVENOUS

## 2017-06-06 MED ORDER — ENSURE ENLIVE PO LIQD
237.0000 mL | Freq: Two times a day (BID) | ORAL | Status: DC
  Administered 2017-06-06 – 2017-06-08 (×4): 237 mL via ORAL

## 2017-06-06 MED ORDER — ORAL CARE MOUTH RINSE
15.0000 mL | Freq: Two times a day (BID) | OROMUCOSAL | Status: DC
  Administered 2017-06-06 – 2017-06-08 (×4): 15 mL via OROMUCOSAL

## 2017-06-06 MED ORDER — PANTOPRAZOLE SODIUM 40 MG PO TBEC
40.0000 mg | DELAYED_RELEASE_TABLET | Freq: Two times a day (BID) | ORAL | Status: DC
  Administered 2017-06-06 – 2017-06-08 (×4): 40 mg via ORAL
  Filled 2017-06-06 (×4): qty 1

## 2017-06-06 MED ORDER — SODIUM CHLORIDE 0.9 % IV SOLN
300.0000 mg | Freq: Once | INTRAVENOUS | Status: AC
  Administered 2017-06-07: 300 mg via INTRAVENOUS
  Filled 2017-06-06: qty 15

## 2017-06-06 MED ORDER — SODIUM CHLORIDE 0.9 % IV SOLN
INTRAVENOUS | Status: DC
  Administered 2017-06-06: 1000 mL via INTRAVENOUS

## 2017-06-06 MED ORDER — PROPOFOL 10 MG/ML IV BOLUS
INTRAVENOUS | Status: DC | PRN
  Administered 2017-06-06: 50 mg via INTRAVENOUS
  Administered 2017-06-06: 20 mg via INTRAVENOUS

## 2017-06-06 MED ORDER — PROPOFOL 500 MG/50ML IV EMUL
INTRAVENOUS | Status: DC | PRN
  Administered 2017-06-06: 50 ug/kg/min via INTRAVENOUS

## 2017-06-06 MED ORDER — SODIUM CHLORIDE 0.9 % IV SOLN
510.0000 mg | Freq: Once | INTRAVENOUS | Status: AC
  Administered 2017-06-06: 510 mg via INTRAVENOUS
  Filled 2017-06-06: qty 17

## 2017-06-06 MED ORDER — PHENYLEPHRINE HCL 10 MG/ML IJ SOLN
INTRAMUSCULAR | Status: DC | PRN
  Administered 2017-06-06: 100 ug via INTRAVENOUS

## 2017-06-06 MED ORDER — FENTANYL CITRATE (PF) 100 MCG/2ML IJ SOLN
INTRAMUSCULAR | Status: DC | PRN
  Administered 2017-06-06: 50 ug via INTRAVENOUS

## 2017-06-06 MED ORDER — PROPOFOL 10 MG/ML IV BOLUS
INTRAVENOUS | Status: AC
  Filled 2017-06-06: qty 20

## 2017-06-06 MED ORDER — ALUM & MAG HYDROXIDE-SIMETH 200-200-20 MG/5ML PO SUSP
30.0000 mL | Freq: Four times a day (QID) | ORAL | Status: DC | PRN
  Administered 2017-06-06: 30 mL via ORAL
  Filled 2017-06-06: qty 30

## 2017-06-06 NOTE — Plan of Care (Signed)
Pt went for EGD today. Tolerated well and advanced to regular diet

## 2017-06-06 NOTE — Progress Notes (Signed)
EGD post procedure note:  Impression: - Normal duodenal bulb and second portion of the duodenum. - Non-bleeding gastric ulcer with a flat pigmented spot (Forrest Class IIc). Treated with bipolar cautery. Biopsied. - Non-bleeding gastric ulcers with a clean ulcer base (Forrest Class III). - Most likely due to NSAID and ETOH use - Normal cardia, gastric fundus and gastric body. Biopsied. - Normal gastroesophageal junction and esophagus.  Recs: - Await pathology results. - Return patient to hospital ward for ongoing care. - Resume regular diet today, ensure BID. - Continue present medications. - Avoid NSAIDs  - ASA 81mg  is okay - Use Protonix (pantoprazole) 40 mg PO BID for 6 months atleast - IV iron as inpt and out pt - Check thiamine levels with h/o ETOH use - Consider 2D echo to evaluate for cardiomyopathy with h/o ETOH use - GI clinic f/u in 1-2weeks - Colonoscopy as out pt with in next 23months after she recovers from acute illness - She might benefit from nursing home/rehab  Cephas Darby, MD [place]  [place], [place] [address]  Main: [phone]  Fax: [phone] Pager: [phone]

## 2017-06-06 NOTE — Progress Notes (Signed)
PT Cancellation Note  Patient Details Name: Kayla Boyer MRN: [id number] DOB: 27-Feb-1956   Cancelled Treatment:    Reason Eval/Treat Not Completed: Medical issues which prohibited therapy;Patient at procedure or test/unavailable.  At procedure and will try again later as time and pt allow.   Ramond Dial 06/06/2017, 4:10 PM   [name], PT MS Acute Rehab Dept. Number: [place] and [place]

## 2017-06-06 NOTE — Anesthesia Preprocedure Evaluation (Addendum)
Anesthesia Evaluation  Patient identified by MRN, date of birth, ID band Patient awake    Reviewed: Allergy & Precautions, NPO status , Patient's Chart, lab work & pertinent test results  History of Anesthesia Complications Negative for: history of anesthetic complications  Airway Mallampati: II  TM Distance: >3 FB Neck ROM: Full    Dental  (+) Edentulous Upper, Poor Dentition, Missing   Pulmonary neg pulmonary ROS, neg sleep apnea, COPD,  oxygen dependent,    breath sounds clear to auscultation- rhonchi (-) wheezing      Cardiovascular hypertension, (-) CAD, (-) Past MI, (-) Cardiac Stents and (-) CABG  Rhythm:Regular Rate:Normal - Systolic murmurs and - Diastolic murmurs Echo 3/0/86: - Left ventricle: Wall thickness was increased in a pattern of   moderate LVH. Systolic function was normal. The estimated   ejection fraction was in the range of 60% to 65%. Doppler   parameters are consistent with abnormal left ventricular   relaxation (grade 1 diastolic dysfunction). - Aortic valve: Valve area (Vmax): 2.75 cm^2.    Neuro/Psych negative neurological ROS  negative psych ROS   GI/Hepatic negative GI ROS, Neg liver ROS,   Endo/Other  negative endocrine ROSneg diabetes  Renal/GU negative Renal ROS     Musculoskeletal negative musculoskeletal ROS (+)   Abdominal (+) - obese,   Peds  Hematology  (+) anemia ,   Anesthesia Other Findings Past Medical History: No date: HLD (hyperlipidemia)   Reproductive/Obstetrics                            Anesthesia Physical Anesthesia Plan  ASA: III  Anesthesia Plan: General   Post-op Pain Management:    Induction: Intravenous  PONV Risk Score and Plan: 2 and Propofol infusion  Airway Management Planned: Natural Airway  Additional Equipment:   Intra-op Plan:   Post-operative Plan:   Informed Consent: I have reviewed the patients History and  Physical, chart, labs and discussed the procedure including the risks, benefits and alternatives for the proposed anesthesia with the patient or authorized representative who has indicated his/her understanding and acceptance.   Dental advisory given  Plan Discussed with: CRNA and Anesthesiologist  Anesthesia Plan Comments:        Anesthesia Quick Evaluation

## 2017-06-06 NOTE — Transfer of Care (Signed)
Immediate Anesthesia Transfer of Care Note  Patient: Kayla Boyer  Procedure(s) Performed: ESOPHAGOGASTRODUODENOSCOPY (EGD) (N/A )  Patient Location: PACU  Anesthesia Type:General  Level of Consciousness: sedated  Airway & Oxygen Therapy: Patient Spontanous Breathing  Post-op Assessment: Report given to RN  Post vital signs: Reviewed and stable  Last Vitals:  Vitals:   06/06/17 0444 06/06/17 1234  BP: 126/68 122/74  Pulse: (!) 103 (!) 103  Resp: 18 18  Temp: 36.9 C (!) 36.2 C  SpO2: 100% 100%    Last Pain:  Vitals:   06/06/17 1234  TempSrc: Tympanic  PainSc:          Complications: No apparent anesthesia complications

## 2017-06-06 NOTE — Progress Notes (Signed)
PT Cancellation Note  Patient Details Name: Kayla Boyer MRN: [id number] DOB: [birth year]   Cancelled Treatment:    Reason Eval/Treat Not Completed: Other (comment). Pt is tired and eating a meal, declined therapy today and will try in the AM.   Ramond Dial 06/06/2017, 4:30 PM   [name], PT MS Acute Rehab Dept. Number: [place] and [place]

## 2017-06-06 NOTE — Care Management (Signed)
Patient admitted for anemia.  Patient lives at home with husband. Pharmacy [facility].  Reported that patient has been bedbound for the last year.   Previous admissions orders for [facility], Lift, BSC, O2, and Walker were sent to [facility]. RNCM has contacted [facility] to determine if equipment was delivered. Confirmed that patient has Bed, O2, and BSC delivered.  Patient already had RW in the home.  RNCM following for discharge planning needs.

## 2017-06-06 NOTE — Anesthesia Post-op Follow-up Note (Signed)
Anesthesia QCDR form completed.        

## 2017-06-06 NOTE — Progress Notes (Signed)
[name] at [place] NAME: Kayla Boyer    MR#:  [id number]  DATE OF BIRTH:  1955/06/26  SUBJECTIVE:  CHIEF COMPLAINT:   Chief Complaint  Patient presents with  . Shortness of Breath     Came with weakness, noted to have very low Hb. Have received transfusion and noted to have gastric ulcers on EGD.  REVIEW OF SYSTEMS:  CONSTITUTIONAL: No fever, positive for fatigue or weakness.  EYES: No blurred or double vision.  EARS, NOSE, AND THROAT: No tinnitus or ear pain.  RESPIRATORY: No cough, shortness of breath, wheezing or hemoptysis.  CARDIOVASCULAR: No chest pain, orthopnea, edema.  GASTROINTESTINAL: No nausea, vomiting, diarrhea or abdominal pain.  GENITOURINARY: No dysuria, hematuria.  ENDOCRINE: No polyuria, nocturia,  HEMATOLOGY: No anemia, easy bruising or bleeding SKIN: No rash or lesion. MUSCULOSKELETAL: No joint pain or arthritis.   NEUROLOGIC: No tingling, numbness, weakness.  PSYCHIATRY: No anxiety or depression.   ROS  DRUG ALLERGIES:  No Known Allergies  VITALS:  Blood pressure 122/67, pulse 94, temperature 98 F (36.7 C), temperature source Oral, resp. rate (!) 22, height 5\' 2"  (1.575 m), weight 68 kg (150 lb), SpO2 98 %.  PHYSICAL EXAMINATION:  GENERAL:  62 y.o.-year-old patient lying in the bed with no acute distress.  EYES: Pupils equal, round, reactive to light and accommodation. No scleral icterus. Extraocular muscles intact. Conjunctiva pale. HEENT: Head atraumatic, normocephalic. Oropharynx and nasopharynx clear.  NECK:  Supple, no jugular venous distention. No thyroid enlargement, no tenderness.  LUNGS: Normal breath sounds bilaterally, no wheezing, rales,rhonchi or crepitation. No use of accessory muscles of respiration.  CARDIOVASCULAR: S1, S2 normal. No murmurs, rubs, or gallops.  ABDOMEN: Soft, nontender, nondistended. Bowel sounds present. No organomegaly or mass.  EXTREMITIES: No pedal edema, cyanosis, or  clubbing.  NEUROLOGIC: Cranial nerves II through XII are intact. Muscle strength 4-5/5 in all extremities. Sensation intact. Gait not checked.  PSYCHIATRIC: The patient is alert and oriented x 3.  SKIN: No obvious rash, lesion, or ulcer.   Physical Exam LABORATORY PANEL:   CBC Recent Labs  Lab 06/05/17 1313 06/05/17 1720  06/06/17 1541  WBC 10.7  --   --   --   HGB 3.4* 8.3*   < > 7.6*  HCT 12.4* 25.3*  --   --   PLT 867*  --   --   --    < > = values in this interval not displayed.   ------------------------------------------------------------------------------------------------------------------  Chemistries  Recent Labs  Lab 06/05/17 1313  NA 138  K 3.9  CL 92*  CO2 39*  GLUCOSE 166*  BUN 15  CREATININE <0.30*  CALCIUM 8.6*  AST 16  ALT 9*  ALKPHOS 38  BILITOT 0.3   ------------------------------------------------------------------------------------------------------------------  Cardiac Enzymes Recent Labs  Lab 06/05/17 1313  TROPONINI <0.03   ------------------------------------------------------------------------------------------------------------------  RADIOLOGY:  Dg Chest 1 View  Result Date: 06/05/2017 CLINICAL DATA:  Several day history of shortness of breath. Chronic upper abdominal pain. EXAM: CHEST 1 VIEW COMPARISON:  CT scan chest of November 05, 2016 FINDINGS: The lungs are adequately inflated. There is blunting of the costophrenic angles bilaterally which is not a new finding. The retrocardiac region is dense. The cardiac silhouette is enlarged. The pulmonary vascularity is normal. The mediastinum is normal in width. There is multilevel degenerative disc disease of the thoracic spine. IMPRESSION: Small bilateral pleural effusions. Left lower lobe atelectasis or pneumonia. No pulmonary edema. Stable cardiomegaly. Followup PA and  lateral chest X-ray is recommended in 3-4 weeks following trial of antibiotic therapy to ensure resolution and exclude  underlying malignancy. Electronically Signed   By: David  Martinique M.D.   On: 06/05/2017 14:02   Ct Abdomen Pelvis W Contrast  Result Date: 06/05/2017 CLINICAL DATA:  Chronic upper abdominal pain. Low hemoglobin and hematocrit. EXAM: CT ABDOMEN AND PELVIS WITH CONTRAST TECHNIQUE: Multidetector CT imaging of the abdomen and pelvis was performed using the standard protocol following bolus administration of intravenous contrast. CONTRAST:  179mL ISOVUE-300 IOPAMIDOL (ISOVUE-300) INJECTION 61% COMPARISON:  None. FINDINGS: Lower chest: Again noted is left lower lobe atelectasis and volume loss. Hepatobiliary: No focal liver abnormality. Multiple stones within the gallbladder are identified which measure up to 1.6 cm. No biliary dilatation. Pancreas: Unremarkable. No pancreatic ductal dilatation or surrounding inflammatory changes. Spleen: Normal in size without focal abnormality. Adrenals/Urinary Tract: The adrenal glands appear normal. Unremarkable appearance of both kidneys. The urinary bladder is normal. Stomach/Bowel: Normal appearance of the stomach. No abnormal small bowel wall thickening or inflammation. No abnormal small bowel dilatation. The appendix is visualized and appears normal. Large stool burden is identified within the rectum. Vascular/Lymphatic: Aortic atherosclerosis. No aneurysm. No upper abdominal or pelvic adenopathy. No inguinal adenopathy. Reproductive: Retroverted uterus.  No adnexal mass. Other: No free fluid or fluid collections within the abdomen or pelvis. 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 No aggressive lytic or sclerotic bone lesions. IMPRESSION: 1. No acute findings within the abdomen or pelvis. 2. Large stool burden identified within the rectum. Correlate for any clinical signs or symptoms of constipation. 3. Multiple stones identified within the gallbladder. 4.  Aortic Atherosclerosis (ICD10-I70.0). 5. Chronic volume loss and atelectasis noted  within the left base. Electronically Signed   By: Kerby Moors M.D.   On: 06/05/2017 15:24    ASSESSMENT AND PLAN:   Active Problems:   Anemia  62 year old female with history of chronic hypoxic respiratory failure on 2 L of oxygen essential hypertension who presents with shortness of breath and weakness.  1. Acute microcytic anemia with guaiac positive stool s/p blood transfusion. Hb stable. Hemoglobin every 6 hours Continue PPI IV- now changed to oral BID. stop aspirin, counseled to stop alcohol. EGD done- have gastric ulcers.  2.Essential hypertension: BP is running normal  Low, hold meds.  3. Chronic hypoxic respiratory failure: Continue O2   All the records are reviewed and case discussed with Care Management/Social Workerr. Management plans discussed with the patient, family and they are in agreement.  CODE STATUS: full.  TOTAL TIME TAKING CARE OF THIS PATIENT: 35 minutes.     POSSIBLE D/C IN 1-2 DAYS, DEPENDING ON CLINICAL CONDITION.   Vaughan Basta M.D on 06/06/2017   Between 7am to 6pm - Pager - [phone]  After 6pm go to [email or web address] - password [email or web address] [facility]  Office  [phone]  CC: Primary care physician; Patient, [name]  Note: This dictation was prepared with Dragon dictation along with smaller phrase technology. Any transcriptional errors that result from this process are unintentional.

## 2017-06-06 NOTE — Op Note (Addendum)
[facility] Patient Name: Kayla Boyer Procedure Date: 06/06/2017 1:20 PM MRN: [id number] Account #: [id number] Date of Birth: October 15, 1955 Admit Type: Inpatient Age: 62 Room: [facility] ENDO ROOM 4 Gender: Female Note Status: Finalized Procedure:            Upper GI endoscopy Indications:          Iron deficiency anemia due to suspected upper                        gastrointestinal bleeding Providers:            Lin Landsman MD, MD Referring MD:         No Local Md, MD (Referring MD) Medicines:            Monitored Anesthesia Care Complications:        No immediate complications. Estimated blood loss:                        Minimal. Procedure:            Pre-Anesthesia Assessment:                       - Prior to the procedure, a History and Physical was                        performed, and patient medications and allergies were                        reviewed. The patient is competent. The risks and                        benefits of the procedure and the sedation options and                        risks were discussed with the patient. All questions                        were answered and informed consent was obtained.                        Patient identification and proposed procedure were                        verified by the physician, the nurse, the                        anesthesiologist, the anesthetist and the technician in                        the pre-procedure area in the procedure room. Mental                        Status Examination: alert and oriented. Airway                        Examination: normal oropharyngeal airway and neck                        mobility. Respiratory Examination: clear to  auscultation. CV Examination: normal. Prophylactic                        Antibiotics: The patient does not require prophylactic                        antibiotics. Prior Anticoagulants: The patient has                taken aspirin, last dose was 1 day prior to procedure.                        ASA Grade Assessment: III - A patient with severe                        systemic disease. After reviewing the risks and                        benefits, the patient was deemed in satisfactory                        condition to undergo the procedure. The anesthesia plan                        was to use monitored anesthesia care (MAC). Immediately                        prior to administration of medications, the patient was                        re-assessed for adequacy to receive sedatives. The                        heart rate, respiratory rate, oxygen saturations, blood                        pressure, adequacy of pulmonary ventilation, and                        response to care were monitored throughout the                        procedure. The physical status of the patient was                        re-assessed after the procedure.                       After obtaining informed consent, the endoscope was                        passed under direct vision. Throughout the procedure,                        the patient's blood pressure, pulse, and oxygen                        saturations were monitored continuously. The Endoscope                        was introduced through the mouth, and advanced to  the                        second part of duodenum. The upper GI endoscopy was                        accomplished without difficulty. The patient tolerated                        the procedure well. Findings:      The duodenal bulb and second portion of the duodenum were normal.      One non-bleeding cratered gastric ulcer with a flat pigmented spot       (Forrest Class IIc) was found in the prepyloric region of the stomach.       The lesion was 10 mm in largest dimension. Fulguration to ablate the       lesion to prevent bleeding by bipolar probe was successful. Biopsies       from edge of ulcer  were taken with a cold forceps for histology to r/o       malignancy as mucosa appeared abnormal and raised.      Two non-bleeding cratered gastric ulcers with a clean ulcer base       (Forrest Class III) were found on the greater curvature of the gastric       antrum. The largest lesion was 5 mm in largest dimension.      The cardia, gastric fundus and gastric body were normal. Biopsies were       taken with a cold forceps for Helicobacter pylori testing.      The gastroesophageal junction and examined esophagus were normal. Impression:           - Normal duodenal bulb and second portion of the                        duodenum.                       - Non-bleeding gastric ulcer with a flat pigmented spot                        (Forrest Class IIc). Treated with bipolar cautery.                        Biopsied.                       - Non-bleeding gastric ulcers with a clean ulcer base                        (Forrest Class III).                       - Most likely due to NSAID and ETOH use                       - Normal cardia, gastric fundus and gastric body.                        Biopsied.                       - Normal gastroesophageal junction and esophagus. Recommendation:       -  Await pathology results.                       - Return patient to hospital ward for ongoing care.                       - Resume regular diet today.                       - Continue present medications.                       - Use Protonix (pantoprazole) 40 mg PO BID for 6 months                        atleast                       - IV iron as inpt and out pt Procedure Code(s):    --- Professional ---                       35701, 74, Esophagogastroduodenoscopy, flexible,                        transoral; with control of bleeding, any method                       43239, 51, Esophagogastroduodenoscopy, flexible,                        transoral; with biopsy, single or multiple Diagnosis Code(s):    ---  Professional ---                       K25.9, Gastric ulcer, unspecified as acute or chronic,                        without hemorrhage or perforation                       D50.9, Iron deficiency anemia, unspecified CPT copyright 2016 American Medical Association. All rights reserved. The codes documented in this report are preliminary and upon coder review may  be revised to meet current compliance requirements. Dr. Ulyess Mort Lin Landsman MD, MD 06/06/2017 2:12:02 PM This report has been signed electronically. Number of Addenda: 0 Note Initiated On: 06/06/2017 1:20 PM      [facility]

## 2017-06-07 DIAGNOSIS — K922 Gastrointestinal hemorrhage, unspecified: Secondary | ICD-10-CM

## 2017-06-07 LAB — HEMOGLOBIN
HEMOGLOBIN: 7.5 g/dL — AB (ref 12.0–16.0)
HEMOGLOBIN: 7.9 g/dL — AB (ref 12.0–16.0)

## 2017-06-07 MED ORDER — FERROUS SULFATE 325 (65 FE) MG PO TABS
325.0000 mg | ORAL_TABLET | Freq: Two times a day (BID) | ORAL | Status: DC
  Administered 2017-06-07 – 2017-06-08 (×2): 325 mg via ORAL
  Filled 2017-06-07 (×2): qty 1

## 2017-06-07 MED ORDER — ALPRAZOLAM 0.25 MG PO TABS
0.2500 mg | ORAL_TABLET | Freq: Three times a day (TID) | ORAL | Status: DC | PRN
  Administered 2017-06-07 – 2017-06-08 (×2): 0.25 mg via ORAL
  Filled 2017-06-07 (×2): qty 1

## 2017-06-07 NOTE — Progress Notes (Signed)
[facility] NAME: Kayla Boyer    MR#:  [id number]  DATE OF BIRTH:  03-Jan-1956  SUBJECTIVE:  CHIEF COMPLAINT:   Chief Complaint  Patient presents with  . Shortness of Breath     Came with weakness, noted to have very low Hb. Have received transfusion and noted to have gastric ulcers on EGD.   Hemoglobin appears stable, receiving IV iron, have generalized weakness.  REVIEW OF SYSTEMS:  CONSTITUTIONAL: No fever, positive for fatigue or weakness.  EYES: No blurred or double vision.  EARS, NOSE, AND THROAT: No tinnitus or ear pain.  RESPIRATORY: No cough, shortness of breath, wheezing or hemoptysis.  CARDIOVASCULAR: No chest pain, orthopnea, edema.  GASTROINTESTINAL: No nausea, vomiting, diarrhea or abdominal pain.  GENITOURINARY: No dysuria, hematuria.  ENDOCRINE: No polyuria, nocturia,  HEMATOLOGY: No anemia, easy bruising or bleeding SKIN: No rash or lesion. MUSCULOSKELETAL: No joint pain or arthritis.   NEUROLOGIC: No tingling, numbness, weakness.  PSYCHIATRY: No anxiety or depression.   ROS  DRUG ALLERGIES:  No Known Allergies  VITALS:  Blood pressure 122/62, pulse 98, temperature (!) 97.4 F (36.3 C), temperature source Oral, resp. rate 18, height 5\' 2"  (1.575 m), weight 61.4 kg (135 lb 5.8 oz), SpO2 96 %.  PHYSICAL EXAMINATION:  GENERAL:  62 y.o.-year-old patient lying in the bed with no acute distress.  EYES: Pupils equal, round, reactive to light and accommodation. No scleral icterus. Extraocular muscles intact. Conjunctiva pale. HEENT: Head atraumatic, normocephalic. Oropharynx and nasopharynx clear.  NECK:  Supple, no jugular venous distention. No thyroid enlargement, no tenderness.  LUNGS: Normal breath sounds bilaterally, no wheezing, rales,rhonchi or crepitation. No use of accessory muscles of respiration.  CARDIOVASCULAR: S1, S2 normal. No murmurs, rubs, or gallops.  ABDOMEN: Soft, nontender, nondistended.  Bowel sounds present. No organomegaly or mass.  EXTREMITIES: No pedal edema, cyanosis, or clubbing.  NEUROLOGIC: Cranial nerves II through XII are intact. Muscle strength 3-4/5 in all extremities. Sensation intact. Gait not checked.  PSYCHIATRIC: The patient is alert and oriented x 3.  SKIN: No obvious rash, lesion, or ulcer.   Physical Exam LABORATORY PANEL:   CBC Recent Labs  Lab 06/05/17 1313 06/05/17 1720  06/07/17 1024  WBC 10.7  --   --   --   HGB 3.4* 8.3*   < > 7.9*  HCT 12.4* 25.3*  --   --   PLT 867*  --   --   --    < > = values in this interval not displayed.   ------------------------------------------------------------------------------------------------------------------  Chemistries  Recent Labs  Lab 06/05/17 1313  NA 138  K 3.9  CL 92*  CO2 39*  GLUCOSE 166*  BUN 15  CREATININE <0.30*  CALCIUM 8.6*  AST 16  ALT 9*  ALKPHOS 38  BILITOT 0.3   ------------------------------------------------------------------------------------------------------------------  Cardiac Enzymes Recent Labs  Lab 06/05/17 1313  TROPONINI <0.03   ------------------------------------------------------------------------------------------------------------------  RADIOLOGY:  Ct Abdomen Pelvis W Contrast  Result Date: 06/05/2017 CLINICAL DATA:  Chronic upper abdominal pain. Low hemoglobin and hematocrit. EXAM: CT ABDOMEN AND PELVIS WITH CONTRAST TECHNIQUE: Multidetector CT imaging of the abdomen and pelvis was performed using the standard protocol following bolus administration of intravenous contrast. CONTRAST:  178mL ISOVUE-300 IOPAMIDOL (ISOVUE-300) INJECTION 61% COMPARISON:  None. FINDINGS: Lower chest: Again noted is left lower lobe atelectasis and volume loss. Hepatobiliary: No focal liver abnormality. Multiple stones within the gallbladder are identified which measure up to 1.6 cm. No biliary  dilatation. Pancreas: Unremarkable. No pancreatic ductal dilatation or  surrounding inflammatory changes. Spleen: Normal in size without focal abnormality. Adrenals/Urinary Tract: The adrenal glands appear normal. Unremarkable appearance of both kidneys. The urinary bladder is normal. Stomach/Bowel: Normal appearance of the stomach. No abnormal small bowel wall thickening or inflammation. No abnormal small bowel dilatation. The appendix is visualized and appears normal. Large stool burden is identified within the rectum. Vascular/Lymphatic: Aortic atherosclerosis. No aneurysm. No upper abdominal or pelvic adenopathy. No inguinal adenopathy. Reproductive: Retroverted uterus.  No adnexal mass. Other: No free fluid or fluid collections within the abdomen or pelvis. 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 No aggressive lytic or sclerotic bone lesions. IMPRESSION: 1. No acute findings within the abdomen or pelvis. 2. Large stool burden identified within the rectum. Correlate for any clinical signs or symptoms of constipation. 3. Multiple stones identified within the gallbladder. 4.  Aortic Atherosclerosis (ICD10-I70.0). 5. Chronic volume loss and atelectasis noted within the left base. Electronically Signed   By: Kerby Moors M.D.   On: 06/05/2017 15:24    ASSESSMENT AND PLAN:   Active Problems:   Anemia  61 year old female with history of chronic hypoxic respiratory failure on 2 L of oxygen essential hypertension who presents with shortness of breath and weakness.  1. Acute microcytic anemia with guaiac positive stool s/p blood transfusion. Hb stable. Continue PPI IV- now changed to oral BID. stop aspirin, counseled to stop alcohol. EGD done- have gastric ulcers.   IV iron, and started on oral iron therapy.  2.Essential hypertension: BP is running normal  Low, hold meds.  3. Chronic hypoxic respiratory failure: Continue O2    Due to COPD as per patient, currently no exacerbation symptoms.  As per her, she is bedbound at  home and have home oxygen in hospital bed. Husband and home health agency takes care of her.  All the records are reviewed and case discussed with Care Management/Social Workerr. Management plans discussed with the patient, family and they are in agreement.  CODE STATUS: full.  TOTAL TIME TAKING CARE OF THIS PATIENT: 35 minutes.     POSSIBLE D/C IN 1-2 DAYS, DEPENDING ON CLINICAL CONDITION.   Vaughan Basta M.D on 06/07/2017   Between 7am to 6pm - Pager - [phone]  After 6pm go to [email or web address] - password [email or web address] [facility]  Office  [phone]  CC: Primary care physician; Patient, [name]  Note: This dictation was prepared with Dragon dictation along with smaller phrase technology. Any transcriptional errors that result from this process are unintentional.

## 2017-06-07 NOTE — Anesthesia Postprocedure Evaluation (Signed)
Anesthesia Post Note  Patient: Kayla Boyer  Procedure(s) Performed: ESOPHAGOGASTRODUODENOSCOPY (EGD) (N/A )  Patient location during evaluation: Endoscopy Anesthesia Type: General Level of consciousness: awake and alert and oriented Pain management: pain level controlled Vital Signs Assessment: post-procedure vital signs reviewed and stable Respiratory status: spontaneous breathing, nonlabored ventilation and respiratory function stable Cardiovascular status: blood pressure returned to baseline and stable Postop Assessment: no signs of nausea or vomiting Anesthetic complications: no     Last Vitals:  Vitals:   06/06/17 2104 06/07/17 0455  BP: 123/85 122/62  Pulse: 99 93  Resp: 20 18  Temp: 36.7 C (!) 36.3 C  SpO2: (!) 87% 100%    Last Pain:  Vitals:   06/07/17 0840  TempSrc:   PainSc: 0-No pain                 Navi Erber

## 2017-06-07 NOTE — Progress Notes (Signed)
Kayla Darby, MD [address]  [place], [place] [address]  Main: [phone]  Fax: [phone] Pager: [phone]   Subjective: Not eating well. Feels weak. She says she doesn't have an appetite. Tried to drink Ensure. Hemoglobin stable. No rectal bleeding. Her son is bedside. Receiving IV iron. Started on oral iron. Her son mentioned that his stepfather who is patient's husband has low IQ and does not have insight into her medical condition.  Objective: Vital signs in last 24 hours: Vitals:   06/06/17 2104 06/07/17 0455 06/07/17 1139 06/07/17 1300  BP: 123/85 122/62  125/71  Pulse: 99 93 98 90  Resp: 20 18  17   Temp: 98.1 F (36.7 C) (!) 97.4 F (36.3 C)  98 F (36.7 C)  TempSrc:  Oral  Oral  SpO2: (!) 87% 100% 96% 97%  Weight:  135 lb 5.8 oz (61.4 kg)    Height:       Weight change: -10.2 oz (-6.64 kg)  Intake/Output Summary (Last 24 hours) at 06/07/2017 1857 Last data filed at 06/07/2017 1439 Gross per 24 hour  Intake 350 ml  Output 0 ml  Net 350 ml     Exam: Heart:: Regular rate and rhythm or S1S2 present Lungs: clear to auscultation Abdomen: soft, nontender, normal bowel sounds   Lab Results: CBC Latest Ref Rng & Units 06/07/2017 06/07/2017 06/06/2017  WBC 3.6 - 11.0 K/uL - - -  Hemoglobin 12.0 - 16.0 g/dL 7.9(L) 7.5(L) 7.5(L)  Hematocrit 35.0 - 47.0 % - - -  Platelets 150 - 440 K/uL - - -   BMP Latest Ref Rng & Units 06/05/2017 11/07/2016 11/04/2016  Glucose 65 - 99 mg/dL 166(H) - -  BUN 6 - 20 mg/dL 15 - -  Creatinine 0.44 - 1.00 mg/dL <0.30(L) 0.41(L) <0.30(L)  Sodium 135 - 145 mmol/L 138 - -  Potassium 3.5 - 5.1 mmol/L 3.9 - -  Chloride 101 - 111 mmol/L 92(L) - -  CO2 22 - 32 mmol/L 39(H) - -  Calcium 8.9 - 10.3 mg/dL 8.6(L) - -   Hepatic Function Latest Ref Rng & Units 06/05/2017  Total Protein 6.5 - 8.1 g/dL 6.4(L)  Albumin 3.5 - 5.0 g/dL 3.0(L)  AST 15 - 41 U/L 16  ALT 14 - 54 U/L 9(L)  Alk Phosphatase 38 - 126 U/L 38  Total  Bilirubin 0.3 - 1.2 mg/dL 0.3   Micro Results: Recent Results (from the past 240 hour(s))  Culture, blood (single) w Reflex to ID Panel     Status: None (Preliminary result)   Collection Time: 06/05/17  1:13 PM  Result Value Ref Range Status   Specimen Description BLOOD BLOOD LEFT HAND  Final   Special Requests   Final    BOTTLES DRAWN AEROBIC ONLY Blood Culture results may not be optimal due to an excessive volume of blood received in culture bottles   Culture   Final    NO GROWTH 2 DAYS Performed at [facility], [address], [place], [place] [address]    Report Status PENDING  Incomplete  Culture, blood (single) w Reflex to ID Panel     Status: None (Preliminary result)   Collection Time: 06/05/17  1:38 PM  Result Value Ref Range Status   Specimen Description BLOOD BLOOD LEFT FOREARM  Final   Special Requests   Final    BOTTLES DRAWN AEROBIC ONLY Blood Culture results may not be optimal due to an excessive volume of  blood received in culture bottles   Culture   Final    NO GROWTH 2 DAYS Performed at [facility], [address] [place], [place] [address]    Report Status PENDING  Incomplete   Studies/Results: No results found. Medications: I have reviewed the patient's current medications. Scheduled Meds: . feeding supplement (ENSURE ENLIVE)  237 mL Oral BID BM  . ferrous sulfate  325 mg Oral BID WC  . mouth rinse  15 mL Mouth Rinse BID  . pantoprazole  40 mg Oral BID AC  . polyethylene glycol  17 g Oral BID  . vitamin C  500 mg Oral Daily   Continuous Infusions: PRN Meds:.acetaminophen **OR** acetaminophen, ALPRAZolam, alum & mag hydroxide-simeth, hydrALAZINE, HYDROcodone-acetaminophen, ondansetron **OR** ondansetron (ZOFRAN) IV, polyethylene glycol   Assessment: Active Problems:   Anemia  Kayla Boyer is a 62 y.o. female with history of hypertension, sinus tachycardia presents with severe symptomatic anemia in the absence of active GI bleed,  history of aspirin 344m daily long-term. Hemoglobin 3.1, MCV 71. There is no evidence of cirrhosis. EGD revealed multiple gastric ulcers, status post cautery. She does have severe iron deficiency  Plan: - Optimize nutrition - Protonix or omeprazole 40 mg twice daily at least for 6 months - Oral iron 325 mg twice daily - Hematology referral for IV iron as outpatient - Avoid NSAIDs - Follow-up in GI clinic in 1-2 weeks after discharge - Discussed plan with her son  GI will sign off for now. Please call [email or web address] with questions   LOS: 2 days   [name] 06/07/2017, 6:57 PM

## 2017-06-07 NOTE — Evaluation (Signed)
Physical Therapy Evaluation Patient Details Name: Kayla Boyer MRN: [id number] DOB: 1955/11/14 Today's Date: 06/07/2017   History of Present Illness  Pt is a42 y.o.femalewith a known history of essential hypertension who presented due to shortness of breath and weakness. Patient's sister was concerned about the patient as she looked very weak and lethargic. When she arrived to the emergency room she was very pale. Her hemoglobin was found to be 3.4.She had an emergent blood transfusion. She has noticed dark-colored stools over the past few days. She denies any abdominal pain. She was guaiac positive in the emergency room. ED physician has already spoken with GI physician.  Patient has a history of EtOH abuse in the past. Her last drink was in December. She would drink anywhere between 1-3 beers a day.  Assessment includes: Acute microcytic anemia with guaiac positive stool s/p transfusion, HTN, and chronic hypoxic respiratory failure.     Clinical Impression  Pt presents with significant deficits in strength, transfers, mobility, gait, B ankle ROM most notably on the left, balance, and activity tolerance.  Pt required min-mod A for bed mobility tasks and was unable to stand secondary to profound BLE weakness.  Pt has been non-ambulatory for > one year and has been bed bound since July of 2018 with spouse assisting with a bed pan for voiding and with all ADLs.  Pt reports having HHPT services that ended in July 2018 and with PT she had progressed to the point where she could stand.  PT ended that July secondary to insurance issues per patient and she has been bed bound ever since.  With the combination of pt's profound BLE weakness and deficits in L ankle DF ROM pt would likely require intense PT intervention to restore meaningful functional mobility.  Discussed case with SW who stated pt would not qualify for short term rehab at this time secondary to insurance limitations.     Follow Up  Recommendations Home health PT    Equipment Recommendations  None recommended by PT    Recommendations for Other Services       Precautions / Restrictions Precautions Precautions: Fall Restrictions Weight Bearing Restrictions: No      Mobility  Bed Mobility Overal bed mobility: Needs Assistance Bed Mobility: Sit to Supine;Supine to Sit     Supine to sit: Min assist Sit to supine: Mod assist   General bed mobility comments: Pt uses BUEs to assist BLEs in and out of the bed but is unable to manage without min-mod A  Transfers                 General transfer comment: Unable/unsafe to attempt  Ambulation/Gait             General Gait Details: Unable/unsafe to attempt  Stairs            Wheelchair Mobility    Modified Rankin (Stroke Patients Only)       Balance Overall balance assessment: Needs assistance Sitting-balance support: Feet unsupported;No upper extremity supported Sitting balance-Leahy Scale: Fair                                       Pertinent Vitals/Pain Pain Assessment: No/denies pain    Home Living Family/patient expects to be discharged to:: Private residence Living Arrangements: Spouse/significant other Available Help at Discharge: Family;Available 24 hours/day Type of Home: House Home Access: Ramped entrance  Home Layout: One level Home Equipment: Walker - 2 wheels;Bedside commode;Grab bars - tub/shower;Shower seat;Wheelchair - manual      Prior Function Level of Independence: Needs assistance   Gait / Transfers Assistance Needed: Pt has been non-ambulatory for > one year per patient and has been bed bound for the last 6 months with spouse assisting with bed pan.  ADL's / Homemaking Assistance Needed: Husband assists with ADLs.        Hand Dominance   Dominant Hand: Right    Extremity/Trunk Assessment   Upper Extremity Assessment Upper Extremity Assessment: Generalized weakness     Lower Extremity Assessment Lower Extremity Assessment: Generalized weakness;RLE deficits/detail;LLE deficits/detail RLE Deficits / Details: Hip flex <3/5, knee ext 1/5, knee flex 2+/5, unable to achieve neutral R ankle DF A/PROM LLE Deficits / Details: Hip flex <3/5, knee ext 2/5, knee flex 2+/5, PF contracture to the L ankle    Cervical / Trunk Assessment Cervical / Trunk Assessment: Kyphotic  Communication   Communication: HOH  Cognition Arousal/Alertness: Awake/alert Behavior During Therapy: WFL for tasks assessed/performed Overall Cognitive Status: Within Functional Limits for tasks assessed                                        General Comments      Exercises Total Joint Exercises Ankle Circles/Pumps: AROM;Both;5 reps;10 reps(Limited bilaterally most notably on the L) Quad Sets: Strengthening;Both;5 reps;10 reps Hip ABduction/ADduction: AAROM;Both;5 reps Straight Leg Raises: AAROM;Both;5 reps Long Arc Quad: AROM;AAROM;Both;5 reps;10 reps Knee Flexion: AROM;AAROM;Both;5 reps;10 reps Other Exercises Other Exercises: Pt encouraged to perform B APs within her ROM x 10 reps 5-6x/day   Assessment/Plan    PT Assessment Patient needs continued PT services  PT Problem List Decreased strength;Decreased range of motion;Decreased activity tolerance;Decreased balance;Decreased mobility       PT Treatment Interventions DME instruction;Gait training;Functional mobility training;Balance training;Therapeutic exercise;Therapeutic activities;Patient/family education    PT Goals (Current goals can be found in the Care Plan section)  Acute Rehab PT Goals Patient Stated Goal: To get my legs stronger PT Goal Formulation: With patient Time For Goal Achievement: 06/20/17 Potential to Achieve Goals: Fair    Frequency Min 2X/week   Barriers to discharge        Co-evaluation               AM-PAC PT "6 Clicks" Daily Activity  Outcome Measure Difficulty  turning over in bed (including adjusting bedclothes, sheets and blankets)?: Unable Difficulty moving from lying on back to sitting on the side of the bed? : Unable Difficulty sitting down on and standing up from a chair with arms (e.g., wheelchair, bedside commode, etc,.)?: Unable Help needed moving to and from a bed to chair (including a wheelchair)?: Total Help needed walking in hospital room?: Total Help needed climbing 3-5 steps with a railing? : Total 6 Click Score: 6    End of Session Equipment Utilized During Treatment: Oxygen Activity Tolerance: Patient tolerated treatment well Patient left: in bed;with call bell/phone within reach;with bed alarm set Nurse Communication: Mobility status PT Visit Diagnosis: Muscle weakness (generalized) (M62.81);Difficulty in walking, not elsewhere classified (R26.2)    Time: 1062-6948 PT Time Calculation (min) (ACUTE ONLY): 32 min   Charges:   PT Evaluation $PT Eval Low Complexity: 1 Low PT Treatments $Therapeutic Exercise: 8-22 mins   PT G Codes:        D. Scott Kamariah Fruchter PT,  DPT 06/07/17, 12:11 PM

## 2017-06-08 LAB — CBC
HEMATOCRIT: 24 % — AB (ref 35.0–47.0)
Hemoglobin: 7.5 g/dL — ABNORMAL LOW (ref 12.0–16.0)
MCH: 25.8 pg — ABNORMAL LOW (ref 26.0–34.0)
MCHC: 31.1 g/dL — ABNORMAL LOW (ref 32.0–36.0)
MCV: 82.9 fL (ref 80.0–100.0)
Platelets: 592 10*3/uL — ABNORMAL HIGH (ref 150–440)
RBC: 2.9 MIL/uL — AB (ref 3.80–5.20)
RDW: 23.9 % — AB (ref 11.5–14.5)
WBC: 11.8 10*3/uL — AB (ref 3.6–11.0)

## 2017-06-08 LAB — BASIC METABOLIC PANEL
ANION GAP: 8 (ref 5–15)
BUN: 7 mg/dL (ref 6–20)
CALCIUM: 9.4 mg/dL (ref 8.9–10.3)
CO2: 43 mmol/L — AB (ref 22–32)
Chloride: 92 mmol/L — ABNORMAL LOW (ref 101–111)
Creatinine, Ser: <0.3 mg/dL — ABNORMAL LOW (ref 0.44–1.00)
GLUCOSE: 102 mg/dL — AB (ref 65–99)
POTASSIUM: 4.2 mmol/L (ref 3.5–5.1)
Sodium: 143 mmol/L (ref 135–145)

## 2017-06-08 LAB — PREPARE RBC (CROSSMATCH)

## 2017-06-08 LAB — SURGICAL PATHOLOGY

## 2017-06-08 MED ORDER — ENSURE ENLIVE PO LIQD: 237.0000 mL | Freq: Two times a day (BID) | ORAL | 12 refills | Status: DC

## 2017-06-08 MED ORDER — PANTOPRAZOLE SODIUM 40 MG PO TBEC: 40.0000 mg | DELAYED_RELEASE_TABLET | Freq: Two times a day (BID) | ORAL | 0 refills | Status: DC

## 2017-06-08 MED ORDER — AMOXICILLIN-POT CLAVULANATE 875-125 MG PO TABS: 1.0000 | ORAL_TABLET | Freq: Two times a day (BID) | ORAL | 0 refills | Status: AC

## 2017-06-08 MED ORDER — CIPROFLOXACIN-HYDROCORTISONE 0.2-1 % OT SUSP: 4.0000 [drp] | Freq: Two times a day (BID) | OTIC | 0 refills | Status: DC

## 2017-06-08 MED ORDER — FERROUS SULFATE 325 (65 FE) MG PO TABS: 325.0000 mg | ORAL_TABLET | Freq: Two times a day (BID) | ORAL | 3 refills | Status: DC

## 2017-06-08 NOTE — Discharge Summary (Addendum)
[facility] NAME: Kayla Boyer    MR#:  [id number]  DATE OF BIRTH:  11/18/1955  DATE OF ADMISSION:  06/05/2017 ADMITTING PHYSICIAN: Bettey Costa, MD  DATE OF DISCHARGE: 06/08/2017   PRIMARY CARE PHYSICIAN: Patient, [name]    ADMISSION DIAGNOSIS:  Anemia due to acute blood loss [D62] Gastrointestinal hemorrhage, unspecified gastrointestinal hemorrhage type [K92.2] Dyspnea, unspecified type [R06.00]  DISCHARGE DIAGNOSIS:  Active Problems:   Anemia   SECONDARY DIAGNOSIS:   Past Medical History:  Diagnosis Date  . HLD (hyperlipidemia)     HOSPITAL COURSE:   63 year old female with history of chronic hypoxic respiratory failure on 2 L of oxygen essential hypertension who presents with shortness of breath and weakness.  1. Acute microcytic anemia with guaiac positive stool s/p blood transfusion. Hb stable. Continue PPI IV- now changed to oral BID. stop aspirin, counseled to stop alcohol. EGD done- have gastric ulcers.   IV iron, and started on oral iron therapy.   Need to follow in GI clinic in 2 weeks.  2.Essential hypertension: BP is running normal  Low, hold meds.  3. Chronic hypoxic respiratory failure: Continue O2    Due to COPD as per patient, currently no exacerbation symptoms.  4. Ear pain    Has hx of inner ear infection,     No local or mastoid tenderness.    Will give oral augmentin .  As per her, she is bedbound at home and have home oxygen in hospital bed. Husband and home health agency takes care of her.    DISCHARGE CONDITIONS:   Stable.  CONSULTS OBTAINED:    DRUG ALLERGIES:  No Known Allergies  DISCHARGE MEDICATIONS:   Allergies as of 06/08/2017   No Known Allergies     Medication List    STOP taking these medications   aspirin EC 81 MG tablet   isosorbide mononitrate 30 MG 24 hr tablet Commonly known as:  IMDUR   predniSONE 10 MG (21) Tbpk tablet Commonly known  as:  STERAPRED UNI-PAK 21 TAB     TAKE these medications   amoxicillin-clavulanate 875-125 MG tablet Commonly known as:  AUGMENTIN Take 1 tablet by mouth 2 (two) times daily for 5 days.   feeding supplement (ENSURE ENLIVE) Liqd Take 237 mLs by mouth 2 (two) times daily between meals.   ferrous sulfate 325 (65 FE) MG tablet Take 1 tablet (325 mg total) by mouth 2 (two) times daily with a meal. What changed:  when to take this   pantoprazole 40 MG tablet Commonly known as:  PROTONIX Take 1 tablet (40 mg total) by mouth 2 (two) times daily before a meal.   vitamin B-12 1000 MCG tablet Commonly known as:  CYANOCOBALAMIN Take 1,000 mcg by mouth daily.   vitamin C 500 MG tablet Commonly known as:  ASCORBIC ACID Take 500 mg by mouth daily.        DISCHARGE INSTRUCTIONS:    Follow with GI in 2 weeks.  If you experience worsening of your admission symptoms, develop shortness of breath, life threatening emergency, suicidal or homicidal thoughts you must seek medical attention immediately by calling 911 or calling your MD immediately  if symptoms less severe.  You Must read complete instructions/literature along with all the possible adverse reactions/side effects for all the Medicines you take and that have been prescribed to you. Take any new Medicines after you have completely understood and accept all the possible adverse  reactions/side effects.   Please note  You were cared for by a hospitalist during your hospital stay. If you have any questions about your discharge medications or the care you received while you were in the hospital after you are discharged, you can call the unit and asked to speak with the hospitalist on call if the hospitalist that took care of you is not available. Once you are discharged, your primary care physician will handle any further medical issues. Please note that NO REFILLS for any discharge medications will be authorized once you are discharged, as  it is imperative that you return to your primary care physician (or establish a relationship with a primary care physician if you do not have one) for your aftercare needs so that they can reassess your need for medications and monitor your lab values.    Today   CHIEF COMPLAINT:   Chief Complaint  Patient presents with  . Shortness of Breath    HISTORY OF PRESENT ILLNESS:  Kayla Boyer  is a 62 y.o. female with a known history of essential hypertension who presents today due to shortness breath and weakness. Patient's sister was concerned about the patient this morning as she looked very weak and lethargic. When she arrived to the emergency room she was very pale. Her hemoglobin was found to be 3.4.  She had an emergent blood transfusion and she reports that she is feeling 100% better. She has noticed dark-colored stools over the past few days. She denies any abdominal pain. She was guaiac positive in the emergency room. ED physician has already spoken with GI physician. Patient has a history of EtOH abuse in the past. Her last drink was in December. She would drink anywhere between 1-3 beers a day. And aspirin on a daily basis.   VITAL SIGNS:  Blood pressure 133/87, pulse 100, temperature 98 F (36.7 C), temperature source Oral, resp. rate 16, height 5\' 2"  (1.575 m), weight 61 kg (134 lb 6.4 oz), SpO2 100 %.  I/O:    Intake/Output Summary (Last 24 hours) at 06/08/2017 1119 Last data filed at 06/08/2017 1028 Gross per 24 hour  Intake 337 ml  Output 0 ml  Net 337 ml    PHYSICAL EXAMINATION:   GENERAL:  62 y.o.-year-old patient lying in the bed with no acute distress.  EYES: Pupils equal, round, reactive to light and accommodation. No scleral icterus. Extraocular muscles intact. Conjunctiva pale. HEENT: Head atraumatic, normocephalic. Oropharynx and nasopharynx clear.  NECK:  Supple, no jugular venous distention. No thyroid enlargement, no tenderness.  LUNGS: Normal breath sounds  bilaterally, no wheezing, rales,rhonchi or crepitation. No use of accessory muscles of respiration.  CARDIOVASCULAR: S1, S2 normal. No murmurs, rubs, or gallops.  ABDOMEN: Soft, nontender, nondistended. Bowel sounds present. No organomegaly or mass.  EXTREMITIES: No pedal edema, cyanosis, or clubbing.  NEUROLOGIC: Cranial nerves II through XII are intact. Muscle strength 3-4/5 in all extremities. Sensation intact. Gait not checked.  PSYCHIATRIC: The patient is alert and oriented x 3.  SKIN: No obvious rash, lesion, or ulcer.     DATA REVIEW:   CBC Recent Labs  Lab 06/08/17 0405  WBC 11.8*  HGB 7.5*  HCT 24.0*  PLT 592*    Chemistries  Recent Labs  Lab 06/05/17 1313 06/08/17 0405  NA 138 143  K 3.9 4.2  CL 92* 92*  CO2 39* 43*  GLUCOSE 166* 102*  BUN 15 7  CREATININE <0.30* <0.30*  CALCIUM 8.6* 9.4  AST 16  --  ALT 9*  --   ALKPHOS 38  --   BILITOT 0.3  --     Cardiac Enzymes Recent Labs  Lab 06/05/17 1313  TROPONINI <0.03    Microbiology Results  Results for orders placed or performed during the hospital encounter of 06/05/17  Culture, blood (single) w Reflex to ID Panel     Status: None (Preliminary result)   Collection Time: 06/05/17  1:13 PM  Result Value Ref Range Status   Specimen Description BLOOD BLOOD LEFT HAND  Final   Special Requests   Final    BOTTLES DRAWN AEROBIC ONLY Blood Culture results may not be optimal due to an excessive volume of blood received in culture bottles   Culture   Final    NO GROWTH 3 DAYS Performed at [facility], [address], [place], [place] [address]    Report Status PENDING  Incomplete  Culture, blood (single) w Reflex to ID Panel     Status: None (Preliminary result)   Collection Time: 06/05/17  1:38 PM  Result Value Ref Range Status   Specimen Description BLOOD BLOOD LEFT FOREARM  Final   Special Requests   Final    BOTTLES DRAWN AEROBIC ONLY Blood Culture results may not be optimal due to an  excessive volume of blood received in culture bottles   Culture   Final    NO GROWTH 3 DAYS Performed at [facility], [address] [place], [place] [address]    Report Status PENDING  Incomplete    RADIOLOGY:  No results found.  EKG:   Orders placed or performed during the hospital encounter of 06/05/17  . ED EKG  . ED EKG      Management plans discussed with the patient, family and they are in agreement.  CODE STATUS:     Code Status Orders  (From admission, onward)        Start     Ordered   06/05/17 1655  Full code  Continuous     06/05/17 1654    Code Status History    Date Active Date Inactive Code Status Order ID Comments User Context   11/01/2016 01:21 11/09/2016 20:44 Full Code [id number]  Lance Coon, MD Inpatient      TOTAL TIME TAKING CARE OF THIS PATIENT: 35 minutes.    Vaughan Basta M.D on 06/08/2017 at 11:19 AM  Between 7am to 6pm - Pager - [phone]  After 6pm go to [email or web address] - password [email or web address] [facility]  Office  [phone]  CC: Primary care physician; Patient, [name]   Note: This dictation was prepared with Dragon dictation along with smaller phrase technology. Any transcriptional errors that result from this process are unintentional.

## 2017-06-08 NOTE — Care Management (Signed)
Patient to discharge home today.  EMS to transport.  Home health orders have been received.  Patient at this time does not have a PCP, and MD declined to sign home health orders in the community.  Patient has been set up with a new patient appointment at her request for [place] 07/10/17.  Patient declines charity home health services, and states "I rather go see a doctor first".  Patient to discharge on oral antibiotics, iron, and protonix.  Scripts have been faxed to Medication Management , and will be ready fror pick up after discharge.  Husband will pick up medication.  Patient states that she plans to "use on of the vans like [facility]", to take her to her follow up appointment.  RNCM signing off.

## 2017-06-08 NOTE — Progress Notes (Signed)
Discharge teaching given to patient, patient verbalized understanding and had no questions. Patient IV removed. Patient will be transported home by EMS. All patient belongings gathered prior to leaving. EMS called and notified of need for transportation home for patient.

## 2017-06-09 LAB — VITAMIN B1: VITAMIN B1 (THIAMINE): 120.1 nmol/L (ref 66.5–200.0)

## 2017-06-10 LAB — CULTURE, BLOOD (SINGLE)
CULTURE: NO GROWTH
CULTURE: NO GROWTH

## 2017-06-20 LAB — BPAM RBC
BLOOD PRODUCT EXPIRATION DATE: 201902152359
Blood Product Expiration Date: 201902152359
Blood Product Expiration Date: 201902162359
Blood Product Expiration Date: 201902262359
Blood Product Expiration Date: 201902272359
Blood Product Expiration Date: 201903122359
Blood Product Expiration Date: 201903122359
ISSUE DATE / TIME: 201902041410
ISSUE DATE / TIME: 201902180945
ISSUE DATE / TIME: 201902180945
ISSUE DATE / TIME: 201902041410
ISSUE DATE / TIME: 201902080953
ISSUE DATE / TIME: 201902082207
ISSUE DATE / TIME: 201902091134
UNIT TYPE AND RH: 5100
UNIT TYPE AND RH: 5100
UNIT TYPE AND RH: 5100
UNIT TYPE AND RH: 5100
UNIT TYPE AND RH: 6200
Unit Type and Rh: 6200
Unit Type and Rh: 6200

## 2017-06-20 LAB — TYPE AND SCREEN
ABO/RH(D): A POS
Antibody Screen: NEGATIVE
UNIT DIVISION: 0
UNIT DIVISION: 0
UNIT DIVISION: 0
Unit division: 0
Unit division: 0
Unit division: 0
Unit division: 0

## 2017-07-04 ENCOUNTER — Emergency Department
Admission: EM | Admit: 2017-07-04 | Discharge: 2017-07-04 | Disposition: A | Discharge status: Home / Self Care | Payer: Medicaid Other | Attending: Emergency Medicine | Admitting: Emergency Medicine

## 2017-07-04 ENCOUNTER — Emergency Department: Payer: Medicaid Other

## 2017-07-04 ENCOUNTER — Other Ambulatory Visit: Payer: Self-pay

## 2017-07-04 DIAGNOSIS — J439 Emphysema, unspecified: Secondary | ICD-10-CM | POA: Insufficient documentation

## 2017-07-04 DIAGNOSIS — R310 Gross hematuria: Secondary | ICD-10-CM | POA: Diagnosis not present

## 2017-07-04 DIAGNOSIS — N390 Urinary tract infection, site not specified: Secondary | ICD-10-CM | POA: Diagnosis not present

## 2017-07-04 DIAGNOSIS — R319 Hematuria, unspecified: Secondary | ICD-10-CM | POA: Diagnosis present

## 2017-07-04 HISTORY — DX: Emphysema, unspecified: J43.9

## 2017-07-04 HISTORY — DX: Gastrointestinal hemorrhage, unspecified: K92.2

## 2017-07-04 LAB — URINALYSIS, COMPLETE (UACMP) WITH MICROSCOPIC
Bacteria, UA: NONE SEEN
Specific Gravity, Urine: 1.018 (ref 1.005–1.030)
Squamous Epithelial / HPF: NONE SEEN

## 2017-07-04 LAB — COMPREHENSIVE METABOLIC PANEL
ALBUMIN: 3.6 g/dL (ref 3.5–5.0)
ALK PHOS: 58 U/L (ref 38–126)
ALT: 18 U/L (ref 14–54)
ANION GAP: 8 (ref 5–15)
AST: 21 U/L (ref 15–41)
BILIRUBIN TOTAL: 0.5 mg/dL (ref 0.3–1.2)
BUN: 9 mg/dL (ref 6–20)
CALCIUM: 10.3 mg/dL (ref 8.9–10.3)
CO2: 48 mmol/L — ABNORMAL HIGH (ref 22–32)
Chloride: 86 mmol/L — ABNORMAL LOW (ref 101–111)
Creatinine, Ser: <0.3 mg/dL — ABNORMAL LOW (ref 0.44–1.00)
GLUCOSE: 123 mg/dL — AB (ref 65–99)
Potassium: 4.6 mmol/L (ref 3.5–5.1)
Sodium: 142 mmol/L (ref 135–145)
Total Protein: 7.7 g/dL (ref 6.5–8.1)

## 2017-07-04 LAB — CBC
HCT: 36.3 % (ref 35.0–47.0)
Hemoglobin: 11.3 g/dL — ABNORMAL LOW (ref 12.0–16.0)
MCH: 28.5 pg (ref 26.0–34.0)
MCHC: 31.2 g/dL — ABNORMAL LOW (ref 32.0–36.0)
MCV: 91.2 fL (ref 80.0–100.0)
Platelets: 357 10*3/uL (ref 150–440)
RBC: 3.98 MIL/uL (ref 3.80–5.20)
RDW: 19.9 % — ABNORMAL HIGH (ref 11.5–14.5)
WBC: 10.8 10*3/uL (ref 3.6–11.0)

## 2017-07-04 MED ORDER — CEFTRIAXONE SODIUM 1 G IJ SOLR
1.0000 g | Freq: Once | INTRAMUSCULAR | Status: AC
  Administered 2017-07-04: 1 g via INTRAVENOUS
  Filled 2017-07-04: qty 10

## 2017-07-04 MED ORDER — CEPHALEXIN 500 MG PO CAPS: 500.0000 mg | ORAL_CAPSULE | Freq: Three times a day (TID) | ORAL | 0 refills | Status: DC

## 2017-07-04 MED ORDER — SODIUM CHLORIDE 0.9 % IV BOLUS (SEPSIS)
500.0000 mL | Freq: Once | INTRAVENOUS | Status: AC
  Administered 2017-07-04: 500 mL via INTRAVENOUS

## 2017-07-04 NOTE — ED Triage Notes (Addendum)
Per EMS, pt got up to use BR, reports pad had blood on it, per EMS unknown if it was rectal bleeding, vaginal bleed or blood in urine. Pt reports hx of ulcer. Pt on chronic 2L. Pt unsure if she is on blood thinners, EMS reports pt takes protonix. EMS VS 159/83, 92 HR, 99% 2L.

## 2017-07-04 NOTE — ED Provider Notes (Signed)
-----------------------------------------   8:38 AM on 07/04/2017 -----------------------------------------  Patient CT scan shows no clear kidney stone.  Given too numerous to count white cells and red cells we will treat with antibiotics and have the patient follow-up with urology.  I discussed this plan of care with the patient and she is agreeable.  She will call today to arrange next available urology appointment.  I have added on a urine culture to the patient's urinalysis.  We will discharge with Keflex.   Harvest Dark, MD 07/04/17 [phone]

## 2017-07-04 NOTE — ED Notes (Addendum)
This RN attempted blood collection, unsuccessful at this time, charge RN notified for lab to follow up. Shanon Brow RN also assessed pt for butterfly blood draw, unable to find adequate site for blood draw.

## 2017-07-04 NOTE — Discharge Instructions (Signed)
As we discussed please take your antibiotic for their entire course.  Please call urology today to make the next available appointment for recheck/reevaluation and further management.  Return to the emergency department for any pain, fever, or any other symptom personally concerning to yourself.

## 2017-07-04 NOTE — ED Notes (Signed)
Pt changed at this time from urine, pt clean and dry.

## 2017-07-04 NOTE — ED Notes (Signed)
Lab in rm at this time

## 2017-07-04 NOTE — ED Provider Notes (Signed)
[facility] Emergency Department Provider Note   ____________________________________________   First MD Initiated Contact with Patient 07/04/17 0522     (approximate)  I have reviewed the triage vital signs and the nursing notes.   HISTORY  Chief Complaint Hematuria    HPI Kayla Boyer is a 62 y.o. female who comes into the hospital today with some blood from below.  The patient states that she woke up this morning to use the restroom.  She was going to have a bowel movement and urinate.  She reports that she has had a GI bleed in the past.  She felt a lot of fluid at once and then saw that it was bloody.  The patient is unsure if the blood came from her vagina or her rectum.  She states that she does have some lower abdominal pressure.  She has had some nausea and felt hot but no vomiting.  The patient called the ambulance because she felt like she was going to pass out.  She came into the hospital today for evaluation.   Past Medical History:  Diagnosis Date  . Emphysema of lung ([place])   . GI bleed   . HLD (hyperlipidemia)     Patient Active Problem List   Diagnosis Date Noted  . Anemia 06/05/2017  . Fall 10/31/2016  . Tachycardia 10/31/2016  . HLD (hyperlipidemia) 10/31/2016    Past Surgical History:  Procedure Laterality Date  . NO PAST SURGERIES    . OOPHORECTOMY Left     Prior to Admission medications   Medication Sig Start Date End Date Taking? Authorizing Provider  feeding supplement, ENSURE ENLIVE, (ENSURE ENLIVE) LIQD Take 237 mLs by mouth 2 (two) times daily between meals. 06/08/17   Vaughan Basta, MD  ferrous sulfate 325 (65 FE) MG tablet Take 1 tablet (325 mg total) by mouth 2 (two) times daily with a meal. 06/08/17   Vaughan Basta, MD  pantoprazole (PROTONIX) 40 MG tablet Take 1 tablet (40 mg total) by mouth 2 (two) times daily before a meal. 06/08/17   Vaughan Basta, MD  vitamin B-12 (CYANOCOBALAMIN) 1000  MCG tablet Take 1,000 mcg by mouth daily.    [provider]  vitamin C (ASCORBIC ACID) 500 MG tablet Take 500 mg by mouth daily.    [provider]    Allergies Patient has no known allergies.  Family History  Family history unknown: Yes    Social History Social History   Tobacco Use  . Smoking status: Never Smoker  . Smokeless tobacco: Never Used  Substance Use Topics  . Alcohol use: No    Frequency: Never  . Drug use: No    Review of Systems  Constitutional: No fever/chills Eyes: No visual changes. ENT: No sore throat. Cardiovascular: Denies chest pain. Respiratory: Denies shortness of breath. Gastrointestinal:  abdominal pressure.  No nausea, no vomiting.  No diarrhea.  No constipation. Genitourinary: Hematuria Musculoskeletal: Negative for back pain. Skin: Negative for rash. Neurological: Negative for headaches, focal weakness or numbness.   ____________________________________________   PHYSICAL EXAM:  VITAL SIGNS: ED Triage Vitals [07/04/17 0526]  Enc Vitals Group     BP (!) 146/85     Pulse Rate 92     Resp (!) 21     Temp 97.7 F (36.5 C)     Temp src      SpO2 97 %     Weight      Height      Head  Circumference      Peak Flow      Pain Score      Pain Loc      Pain Edu?      Excl. in [place]?     Constitutional: Alert and oriented. Well appearing and in mild distress. Eyes: Conjunctivae are normal. PERRL. EOMI. Head: Atraumatic. Nose: No congestion/rhinnorhea. Mouth/Throat: Mucous membranes are moist.  Oropharynx non-erythematous. Cardiovascular: Normal rate, regular rhythm. Grossly normal heart sounds.  Good peripheral circulation. Respiratory: Normal respiratory effort.  No retractions. Lungs CTAB. Gastrointestinal: Soft and nontender. No distention.  Positive bowel sounds Genitourinary: No blood on digital exam of vagina Rectal: No gross blood on rectal exam with heme-negative stool. Musculoskeletal: No lower extremity  tenderness nor edema.   Neurologic:  Normal speech and language.  Skin:  Skin is warm, dry and intact.  Psychiatric: Mood and affect are normal.   ____________________________________________   LABS (all labs ordered are listed, but only abnormal results are displayed)  Labs Reviewed  CBC - Abnormal; Notable for the following components:      Result Value   Hemoglobin 11.3 (*)    MCHC 31.2 (*)    RDW 19.9 (*)    All other components within normal limits  COMPREHENSIVE METABOLIC PANEL - Abnormal; Notable for the following components:   Chloride 86 (*)    CO2 48 (*)    Glucose, Bld 123 (*)    Creatinine, Ser <0.30 (*)    All other components within normal limits  URINALYSIS, COMPLETE (UACMP) WITH MICROSCOPIC - Abnormal; Notable for the following components:   Color, Urine RED (*)    APPearance CLOUDY (*)    Glucose, UA   (*)    Value: TEST NOT REPORTED DUE TO COLOR INTERFERENCE OF URINE PIGMENT   Hgb urine dipstick   (*)    Value: TEST NOT REPORTED DUE TO COLOR INTERFERENCE OF URINE PIGMENT   Bilirubin Urine   (*)    Value: TEST NOT REPORTED DUE TO COLOR INTERFERENCE OF URINE PIGMENT   Ketones, ur   (*)    Value: TEST NOT REPORTED DUE TO COLOR INTERFERENCE OF URINE PIGMENT   Protein, ur   (*)    Value: TEST NOT REPORTED DUE TO COLOR INTERFERENCE OF URINE PIGMENT   Nitrite   (*)    Value: TEST NOT REPORTED DUE TO COLOR INTERFERENCE OF URINE PIGMENT   Leukocytes, UA   (*)    Value: TEST NOT REPORTED DUE TO COLOR INTERFERENCE OF URINE PIGMENT   All other components within normal limits  URINALYSIS, MICROSCOPIC (REFLEX)   ____________________________________________  EKG  none ____________________________________________  RADIOLOGY  ED MD interpretation:  CT renal stone study  Official radiology report(s): No results found.  ____________________________________________   PROCEDURES  Procedure(s) performed: None  Procedures  Critical Care performed:  No  ____________________________________________   INITIAL IMPRESSION / ASSESSMENT AND PLAN / ED COURSE  As part of my medical decision making, I reviewed the following data within the electronic MEDICAL RECORD NUMBER Notes from prior ED visits and [place] Controlled Substance Database   This is a 62 year old female who comes into the hospital today blood from below.  My differential diagnosis includes GI bleed, vaginal bleed, hematuria with UTI or kidney stone.  The patient had no blood on digital rectal exam nor on vaginal exam.  We did notice that the patient's urine was bloody.  The patient had a creatinine of less than 0.03 and a hemoglobin of 11.3.  The patient's urine showed too numerous to count red blood cells and white blood cells with no bacteria.  I did send the patient for a CT scan looking for stone or other cause of her hematuria.  I will give the patient a dose of ceftriaxone for likely infection and she will be reassessed.  The patient's care will be signed out to Dr. Kerman Passey who will follow up the results of the CT scan      ____________________________________________   FINAL CLINICAL IMPRESSION(S) / ED DIAGNOSES  Final diagnoses:  Gross hematuria     ED Discharge Orders    None       Note:  This document was prepared using Dragon voice recognition software and may include unintentional dictation errors.    Loney Hering, MD 07/04/17 [phone]

## 2017-07-05 ENCOUNTER — Ambulatory Visit: Payer: Self-pay | Admitting: Gastroenterology

## 2017-07-05 ENCOUNTER — Encounter: Payer: Self-pay | Admitting: Emergency Medicine

## 2017-07-05 ENCOUNTER — Emergency Department
Admission: EM | Admit: 2017-07-05 | Discharge: 2017-07-06 | Disposition: A | Discharge status: Home / Self Care | Payer: Medicaid Other | Attending: Emergency Medicine | Admitting: Emergency Medicine

## 2017-07-05 DIAGNOSIS — N3001 Acute cystitis with hematuria: Secondary | ICD-10-CM | POA: Diagnosis not present

## 2017-07-05 DIAGNOSIS — Z79899 Other long term (current) drug therapy: Secondary | ICD-10-CM | POA: Insufficient documentation

## 2017-07-05 DIAGNOSIS — R319 Hematuria, unspecified: Secondary | ICD-10-CM

## 2017-07-05 LAB — BASIC METABOLIC PANEL
ANION GAP: 10 (ref 5–15)
BUN: 9 mg/dL (ref 6–20)
CALCIUM: 9.6 mg/dL (ref 8.9–10.3)
CO2: 44 mmol/L — AB (ref 22–32)
Chloride: 84 mmol/L — ABNORMAL LOW (ref 101–111)
Creatinine, Ser: <0.3 mg/dL — ABNORMAL LOW (ref 0.44–1.00)
GLUCOSE: 234 mg/dL — AB (ref 65–99)
Potassium: 4.3 mmol/L (ref 3.5–5.1)
Sodium: 138 mmol/L (ref 135–145)

## 2017-07-05 LAB — URINE CULTURE: Culture: <10000 — AB

## 2017-07-05 LAB — URINALYSIS, COMPLETE (UACMP) WITH MICROSCOPIC
BILIRUBIN URINE: NEGATIVE
Glucose, UA: NEGATIVE mg/dL
KETONES UR: NEGATIVE mg/dL
LEUKOCYTES UA: NEGATIVE
Nitrite: NEGATIVE
Protein, ur: NEGATIVE mg/dL
SPECIFIC GRAVITY, URINE: 1.002 — AB (ref 1.005–1.030)
SQUAMOUS EPITHELIAL / LPF: NONE SEEN
pH: 6 (ref 5.0–8.0)

## 2017-07-05 LAB — CBC
HEMATOCRIT: 36.5 % (ref 35.0–47.0)
Hemoglobin: 11.2 g/dL — ABNORMAL LOW (ref 12.0–16.0)
MCH: 28 pg (ref 26.0–34.0)
MCHC: 30.6 g/dL — AB (ref 32.0–36.0)
MCV: 91.5 fL (ref 80.0–100.0)
PLATELETS: 285 10*3/uL (ref 150–440)
RBC: 3.99 MIL/uL (ref 3.80–5.20)
RDW: 20.2 % — AB (ref 11.5–14.5)
WBC: 14.4 10*3/uL — AB (ref 3.6–11.0)

## 2017-07-05 MED ORDER — SODIUM CHLORIDE 0.9 % IV BOLUS (SEPSIS)
1000.0000 mL | Freq: Once | INTRAVENOUS | Status: AC
  Administered 2017-07-05: 1000 mL via INTRAVENOUS

## 2017-07-05 MED ORDER — SODIUM CHLORIDE 0.9 % IV SOLN
1.0000 g | Freq: Once | INTRAVENOUS | Status: AC
  Administered 2017-07-05: 1 g via INTRAVENOUS
  Filled 2017-07-05: qty 10

## 2017-07-05 MED ORDER — CEPHALEXIN 500 MG PO CAPS: 500.0000 mg | ORAL_CAPSULE | Freq: Three times a day (TID) | ORAL | 0 refills | Status: AC

## 2017-07-05 NOTE — ED Provider Notes (Addendum)
[facility] Emergency Department Provider Note  ____________________________________________  Time seen: Approximately 9:43 PM  I have reviewed the triage vital signs and the nursing notes.   HISTORY  Chief Complaint Hematuria   HPI Kayla Boyer is a 62 y.o. female with a history of COPD and hyperlipidemia who presents for evaluation of hematuria. Patient was seen here yesterday after sudden onset of gross hematuria. Had a urine culture done which was negative for UTI. Had a CT renal done showing several kidney stones but no current ureteral stones. Patient reports having right flank pain that she describes as crampy intermittent since yesterday. No pain at this time. She reports passing a large clot earlier today and feeling extremely dizzy which prompted her visit to the emergency room. She is not on blood thinners. She denies dysuria, current flank pain, abdominal pain, chest pain or shortness of breath.  Chief Complaint: hematuria Quality: gross Severity:severe Duration: 2 days Context: intermittent R flank pain and CT showing renal stones Associated signs/symptoms: diziness  Past Medical History:  Diagnosis Date  . Emphysema of lung ([place])   . GI bleed   . HLD (hyperlipidemia)     Patient Active Problem List   Diagnosis Date Noted  . Anemia 06/05/2017  . Fall 10/31/2016  . Tachycardia 10/31/2016  . HLD (hyperlipidemia) 10/31/2016    Past Surgical History:  Procedure Laterality Date  . NO PAST SURGERIES    . OOPHORECTOMY Left     Prior to Admission medications   Medication Sig Start Date End Date Taking? Authorizing Provider  ferrous sulfate 325 (65 FE) MG tablet Take 1 tablet (325 mg total) by mouth 2 (two) times daily with a meal. 06/08/17  Yes Vaughan Basta, MD  pantoprazole (PROTONIX) 40 MG tablet Take 1 tablet (40 mg total) by mouth 2 (two) times daily before a meal. 06/08/17  Yes Vaughan Basta, MD  vitamin B-12  (CYANOCOBALAMIN) 1000 MCG tablet Take 1,000 mcg by mouth daily.   Yes [provider]  vitamin C (ASCORBIC ACID) 500 MG tablet Take 500 mg by mouth daily.   Yes [provider]  cephALEXin (KEFLEX) 500 MG capsule Take 1 capsule (500 mg total) by mouth 3 (three) times daily. Patient not taking: Reported on 07/05/2017 07/04/17   Harvest Dark, MD  feeding supplement, ENSURE ENLIVE, (ENSURE ENLIVE) LIQD Take 237 mLs by mouth 2 (two) times daily between meals. 06/08/17   Vaughan Basta, MD    Allergies Patient has no known allergies.  Family History  Family history unknown: Yes    Social History Social History   Tobacco Use  . Smoking status: Never Smoker  . Smokeless tobacco: Never Used  Substance Use Topics  . Alcohol use: No    Frequency: Never  . Drug use: No    Review of Systems  Constitutional: Negative for fever. Eyes: Negative for visual changes. ENT: Negative for sore throat. Neck: No neck pain  Cardiovascular: Negative for chest pain. Respiratory: Negative for shortness of breath. Gastrointestinal: Negative for abdominal pain, vomiting or diarrhea. Genitourinary: Negative for dysuria. + hematuria Musculoskeletal: Negative for back pain. Skin: Negative for rash. Neurological: Negative for headaches, weakness or numbness. Psych: No SI or HI  ____________________________________________   PHYSICAL EXAM:  VITAL SIGNS: ED Triage Vitals [07/05/17 2112]  Enc Vitals Group     BP 130/83     Pulse Rate 78     Resp 17     Temp 98.2 F (36.8 C)  Temp Source Oral     SpO2 100 %     Weight 134 lb (60.8 kg)     Height      Head Circumference      Peak Flow      Pain Score      Pain Loc      Pain Edu?      Excl. in [place]?     Constitutional: Alert and oriented. Well appearing and in no apparent distress. HEENT:      Head: Normocephalic and atraumatic.         Eyes: Conjunctivae are normal. Sclera is non-icteric.       Mouth/Throat:  Mucous membranes are moist.       Neck: Supple with no signs of meningismus. Cardiovascular: Regular rate and rhythm. No murmurs, gallops, or rubs. 2+ symmetrical distal pulses are present in all extremities. No JVD. Respiratory: Normal respiratory effort. Lungs are clear to auscultation bilaterally. No wheezes, crackles, or rhonchi.  Gastrointestinal: Soft, non tender, and non distended with positive bowel sounds. No rebound or guarding. Musculoskeletal: Nontender with normal range of motion in all extremities. No edema, cyanosis, or erythema of extremities. Neurologic: Normal speech and language. Face is symmetric. Moving all extremities. No gross focal neurologic deficits are appreciated. Skin: Skin is warm, dry and intact. No rash noted. Psychiatric: Mood and affect are normal. Speech and behavior are normal.  ____________________________________________   LABS (all labs ordered are listed, but only abnormal results are displayed)  Labs Reviewed  CBC - Abnormal; Notable for the following components:      Result Value   WBC 14.4 (*)    Hemoglobin 11.2 (*)    MCHC 30.6 (*)    RDW 20.2 (*)    All other components within normal limits  BASIC METABOLIC PANEL - Abnormal; Notable for the following components:   Chloride 84 (*)    CO2 44 (*)    Glucose, Bld 234 (*)    Creatinine, Ser <0.30 (*)    All other components within normal limits  URINALYSIS, COMPLETE (UACMP) WITH MICROSCOPIC - Abnormal; Notable for the following components:   Color, Urine YELLOW (*)    APPearance HAZY (*)    Specific Gravity, Urine 1.002 (*)    Hgb urine dipstick LARGE (*)    Bacteria, UA RARE (*)    All other components within normal limits  URINE CULTURE   ____________________________________________  EKG  none ____________________________________________  RADIOLOGY  none  ____________________________________________   PROCEDURES  Procedure(s) performed: None Procedures Critical Care  performed:  None ____________________________________________   INITIAL IMPRESSION / ASSESSMENT AND PLAN / ED COURSE   62 y.o. female with a history of COPD and hyperlipidemia who presents for evaluation of hematuria since yesterday and one short-lived episode of dizziness while passing a large clot. Patient is hemodynamically stable, not on any blood thinners. Urine culture sent yesterday was negative for UTI. Patient does have a CT done yesterday showing several renal stones but no evidence of ureteral stones. She has no fever and no dysuria. Will check CBC to rule out acute blood loss anemia, BMP to rule out acute kidney injury. We'll do a postvoid residual to rule out retention.  Clinical Course as of Jul 06 2327  Wed Jul 05, 2017  2313 Hemoglobin unchanged from yesterday. Patient does have a new leukocytosis with white count of 14. UA was collected in the commode and not a catheterized sample, showing rare bacteria, no nitrites or leuks but due  to elevated WBC will give 1g of rocephin and send home on keflex until culture is back. No longer having gross hematuria here. Urine was yellow and clear. Patient tachycardic however review of Epic shows patient has been tachycardic during the last few visits dating back to 3 months ago. No signs of sepsis. Patient has no back/flank pain at this time, therefore will not repeat CT. discussed signs and symptoms of pyelonephritis, worsening UTI, or kidney stones, or acute blood loss anemia recommended he return to the emergency room if these develop. Patient is not retaining at this time with the PVR of 80. Will dc home after abx.  [CV]    Clinical Course User Index [CV] Alfred Levins [place], MD     As part of my medical decision making, I reviewed the following data within the [place] notes reviewed and incorporated, Labs reviewed , Old chart reviewed, Notes from prior ED visits and [place] Controlled Substance  Database    Pertinent labs & imaging results that were available during my care of the patient were reviewed by me and considered in my medical decision making (see chart for details).    ____________________________________________   FINAL CLINICAL IMPRESSION(S) / ED DIAGNOSES  Final diagnoses:  Hematuria, unspecified type      NEW MEDICATIONS STARTED DURING THIS VISIT:  ED Discharge Orders    None       Note:  This document was prepared using Dragon voice recognition software and may include unintentional dictation errors.    Alfred Levins, [place], MD 07/05/17 [place], [place], MD 07/05/17 2329

## 2017-07-05 NOTE — ED Notes (Signed)
Bladder scan (post void)- 20ml

## 2017-07-05 NOTE — ED Triage Notes (Signed)
Pt arrived via EMS from home where pt is bed bound and husband is caretaker for pt. Pt was seen on Tuesday night and discharged home for hematuria, prescribed keflex. Per EMS pt never had prescriptions filled. Pt back to ED due to same hematuria. MD at bedside for further evaluation.

## 2017-07-05 NOTE — Discharge Instructions (Signed)
Return to the ER if you have back pain, abdominal pain, dizziness, chest pain, shortness of breath or if the bleeding continues for more than 3 days. Follow up with Urology in 2-3 days.

## 2017-07-07 LAB — URINE CULTURE: Culture: NO GROWTH

## 2017-07-14 ENCOUNTER — Other Ambulatory Visit: Payer: Self-pay

## 2017-07-14 ENCOUNTER — Encounter: Payer: Self-pay | Admitting: Emergency Medicine

## 2017-07-14 ENCOUNTER — Emergency Department: Payer: Medicaid Other

## 2017-07-14 ENCOUNTER — Inpatient Hospital Stay
Admission: EM | Admit: 2017-07-14 | Discharge: 2017-08-17 | DRG: 004 | Disposition: A | Discharge status: Skilled Nursing Facility | Payer: Medicaid Other | Attending: Internal Medicine | Admitting: Internal Medicine

## 2017-07-14 DIAGNOSIS — Z6820 Body mass index (BMI) 20.0-20.9, adult: Secondary | ICD-10-CM

## 2017-07-14 DIAGNOSIS — Z7189 Other specified counseling: Secondary | ICD-10-CM

## 2017-07-14 DIAGNOSIS — J9811 Atelectasis: Secondary | ICD-10-CM

## 2017-07-14 DIAGNOSIS — R0602 Shortness of breath: Secondary | ICD-10-CM

## 2017-07-14 DIAGNOSIS — F322 Major depressive disorder, single episode, severe without psychotic features: Secondary | ICD-10-CM | POA: Diagnosis present

## 2017-07-14 DIAGNOSIS — D62 Acute posthemorrhagic anemia: Secondary | ICD-10-CM | POA: Diagnosis present

## 2017-07-14 DIAGNOSIS — Z0189 Encounter for other specified special examinations: Secondary | ICD-10-CM

## 2017-07-14 DIAGNOSIS — E538 Deficiency of other specified B group vitamins: Secondary | ICD-10-CM | POA: Diagnosis present

## 2017-07-14 DIAGNOSIS — D175 Benign lipomatous neoplasm of intra-abdominal organs: Secondary | ICD-10-CM | POA: Diagnosis present

## 2017-07-14 DIAGNOSIS — E46 Unspecified protein-calorie malnutrition: Secondary | ICD-10-CM

## 2017-07-14 DIAGNOSIS — G9341 Metabolic encephalopathy: Secondary | ICD-10-CM | POA: Diagnosis present

## 2017-07-14 DIAGNOSIS — R1310 Dysphagia, unspecified: Secondary | ICD-10-CM | POA: Diagnosis not present

## 2017-07-14 DIAGNOSIS — E039 Hypothyroidism, unspecified: Secondary | ICD-10-CM | POA: Diagnosis present

## 2017-07-14 DIAGNOSIS — I10 Essential (primary) hypertension: Secondary | ICD-10-CM | POA: Diagnosis present

## 2017-07-14 DIAGNOSIS — J189 Pneumonia, unspecified organism: Secondary | ICD-10-CM | POA: Diagnosis present

## 2017-07-14 DIAGNOSIS — E785 Hyperlipidemia, unspecified: Secondary | ICD-10-CM | POA: Diagnosis present

## 2017-07-14 DIAGNOSIS — T17908A Unspecified foreign body in respiratory tract, part unspecified causing other injury, initial encounter: Secondary | ICD-10-CM

## 2017-07-14 DIAGNOSIS — Z931 Gastrostomy status: Secondary | ICD-10-CM

## 2017-07-14 DIAGNOSIS — J9621 Acute and chronic respiratory failure with hypoxia: Secondary | ICD-10-CM | POA: Diagnosis present

## 2017-07-14 DIAGNOSIS — K921 Melena: Secondary | ICD-10-CM | POA: Diagnosis present

## 2017-07-14 DIAGNOSIS — E8770 Fluid overload, unspecified: Secondary | ICD-10-CM

## 2017-07-14 DIAGNOSIS — Z9981 Dependence on supplemental oxygen: Secondary | ICD-10-CM | POA: Diagnosis not present

## 2017-07-14 DIAGNOSIS — R609 Edema, unspecified: Secondary | ICD-10-CM

## 2017-07-14 DIAGNOSIS — J9690 Respiratory failure, unspecified, unspecified whether with hypoxia or hypercapnia: Secondary | ICD-10-CM

## 2017-07-14 DIAGNOSIS — J9622 Acute and chronic respiratory failure with hypercapnia: Secondary | ICD-10-CM | POA: Diagnosis present

## 2017-07-14 DIAGNOSIS — Z9189 Other specified personal risk factors, not elsewhere classified: Secondary | ICD-10-CM

## 2017-07-14 DIAGNOSIS — R0603 Acute respiratory distress: Secondary | ICD-10-CM

## 2017-07-14 DIAGNOSIS — F419 Anxiety disorder, unspecified: Secondary | ICD-10-CM | POA: Diagnosis present

## 2017-07-14 DIAGNOSIS — Z993 Dependence on wheelchair: Secondary | ICD-10-CM | POA: Diagnosis not present

## 2017-07-14 DIAGNOSIS — Z7982 Long term (current) use of aspirin: Secondary | ICD-10-CM | POA: Diagnosis not present

## 2017-07-14 DIAGNOSIS — R000 Tachycardia, unspecified: Secondary | ICD-10-CM

## 2017-07-14 DIAGNOSIS — E43 Unspecified severe protein-calorie malnutrition: Secondary | ICD-10-CM | POA: Diagnosis present

## 2017-07-14 DIAGNOSIS — J441 Chronic obstructive pulmonary disease with (acute) exacerbation: Secondary | ICD-10-CM | POA: Diagnosis present

## 2017-07-14 DIAGNOSIS — E876 Hypokalemia: Secondary | ICD-10-CM | POA: Diagnosis present

## 2017-07-14 DIAGNOSIS — J440 Chronic obstructive pulmonary disease with acute lower respiratory infection: Secondary | ICD-10-CM | POA: Diagnosis present

## 2017-07-14 DIAGNOSIS — K922 Gastrointestinal hemorrhage, unspecified: Secondary | ICD-10-CM | POA: Diagnosis present

## 2017-07-14 DIAGNOSIS — E0781 Sick-euthyroid syndrome: Secondary | ICD-10-CM | POA: Diagnosis present

## 2017-07-14 DIAGNOSIS — J9600 Acute respiratory failure, unspecified whether with hypoxia or hypercapnia: Secondary | ICD-10-CM

## 2017-07-14 DIAGNOSIS — E870 Hyperosmolality and hypernatremia: Secondary | ICD-10-CM | POA: Diagnosis present

## 2017-07-14 DIAGNOSIS — M7989 Other specified soft tissue disorders: Secondary | ICD-10-CM

## 2017-07-14 DIAGNOSIS — Z4659 Encounter for fitting and adjustment of other gastrointestinal appliance and device: Secondary | ICD-10-CM

## 2017-07-14 DIAGNOSIS — Z515 Encounter for palliative care: Secondary | ICD-10-CM

## 2017-07-14 DIAGNOSIS — K257 Chronic gastric ulcer without hemorrhage or perforation: Secondary | ICD-10-CM

## 2017-07-14 DIAGNOSIS — E874 Mixed disorder of acid-base balance: Secondary | ICD-10-CM | POA: Diagnosis present

## 2017-07-14 DIAGNOSIS — T801XXA Vascular complications following infusion, transfusion and therapeutic injection, initial encounter: Secondary | ICD-10-CM

## 2017-07-14 LAB — HEPATIC FUNCTION PANEL
ALBUMIN: 3.6 g/dL (ref 3.5–5.0)
ALT: 13 U/L — ABNORMAL LOW (ref 14–54)
AST: 17 U/L (ref 15–41)
Alkaline Phosphatase: 48 U/L (ref 38–126)
Bilirubin, Direct: 0.1 mg/dL (ref 0.1–0.5)
Indirect Bilirubin: 0.4 mg/dL (ref 0.3–0.9)
TOTAL PROTEIN: 7.5 g/dL (ref 6.5–8.1)
Total Bilirubin: 0.5 mg/dL (ref 0.3–1.2)

## 2017-07-14 LAB — CBC WITH DIFFERENTIAL/PLATELET
Basophils Absolute: 0 10*3/uL (ref 0–0.1)
Basophils Relative: 0 %
Eosinophils Absolute: 0.1 10*3/uL (ref 0–0.7)
Eosinophils Relative: 1 %
HEMATOCRIT: 31.3 % — AB (ref 35.0–47.0)
Hemoglobin: 9.7 g/dL — ABNORMAL LOW (ref 12.0–16.0)
LYMPHS ABS: 0.6 10*3/uL — AB (ref 1.0–3.6)
LYMPHS PCT: 4 %
MCH: 28.4 pg (ref 26.0–34.0)
MCHC: 30.8 g/dL — ABNORMAL LOW (ref 32.0–36.0)
MCV: 92.2 fL (ref 80.0–100.0)
Monocytes Absolute: 0.6 10*3/uL (ref 0.2–0.9)
Monocytes Relative: 4 %
NEUTROS ABS: 13.6 10*3/uL — AB (ref 1.4–6.5)
NEUTROS PCT: 91 %
Platelets: 235 10*3/uL (ref 150–440)
RBC: 3.4 MIL/uL — AB (ref 3.80–5.20)
RDW: 18.5 % — ABNORMAL HIGH (ref 11.5–14.5)
WBC: 14.8 10*3/uL — AB (ref 3.6–11.0)

## 2017-07-14 LAB — PROTIME-INR
INR: 0.81
Prothrombin Time: 11.1 seconds — ABNORMAL LOW (ref 11.4–15.2)

## 2017-07-14 LAB — BASIC METABOLIC PANEL
ANION GAP: 8 (ref 5–15)
BUN: 19 mg/dL (ref 6–20)
CO2: 50 mmol/L — ABNORMAL HIGH (ref 22–32)
Calcium: 9.8 mg/dL (ref 8.9–10.3)
Chloride: 86 mmol/L — ABNORMAL LOW (ref 101–111)
Glucose, Bld: 160 mg/dL — ABNORMAL HIGH (ref 65–99)
Potassium: 4 mmol/L (ref 3.5–5.1)
SODIUM: 144 mmol/L (ref 135–145)

## 2017-07-14 LAB — HEMOGLOBIN AND HEMATOCRIT, BLOOD
HCT: 30.6 % — ABNORMAL LOW (ref 35.0–47.0)
Hemoglobin: 9.2 g/dL — ABNORMAL LOW (ref 12.0–16.0)

## 2017-07-14 LAB — PREPARE RBC (CROSSMATCH)

## 2017-07-14 LAB — APTT: APTT: 32 s (ref 24–36)

## 2017-07-14 MED ORDER — SODIUM CHLORIDE 0.9 % IV SOLN
8.0000 mg/h | INTRAVENOUS | Status: AC
  Administered 2017-07-14 – 2017-07-17 (×6): 8 mg/h via INTRAVENOUS
  Filled 2017-07-14 (×7): qty 80

## 2017-07-14 MED ORDER — METOPROLOL TARTRATE 5 MG/5ML IV SOLN
2.5000 mg | Freq: Four times a day (QID) | INTRAVENOUS | Status: DC | PRN
  Administered 2017-07-15: 5 mg via INTRAVENOUS
  Administered 2017-07-15: 2.5 mg via INTRAVENOUS
  Administered 2017-07-15 – 2017-07-18 (×3): 5 mg via INTRAVENOUS
  Administered 2017-07-28 (×2): 2.5 mg via INTRAVENOUS
  Filled 2017-07-14 (×8): qty 5

## 2017-07-14 MED ORDER — IPRATROPIUM-ALBUTEROL 0.5-2.5 (3) MG/3ML IN SOLN
3.0000 mL | Freq: Four times a day (QID) | RESPIRATORY_TRACT | Status: DC
  Administered 2017-07-15 – 2017-07-16 (×6): 3 mL via RESPIRATORY_TRACT
  Filled 2017-07-14 (×8): qty 3

## 2017-07-14 MED ORDER — ACETAMINOPHEN 650 MG RE SUPP: 650.0000 mg | Freq: Four times a day (QID) | RECTAL | Status: DC | PRN

## 2017-07-14 MED ORDER — PANTOPRAZOLE SODIUM 40 MG IV SOLR
40.0000 mg | Freq: Once | INTRAVENOUS | Status: AC
  Administered 2017-07-14: 40 mg via INTRAVENOUS
  Filled 2017-07-14: qty 40

## 2017-07-14 MED ORDER — SODIUM CHLORIDE 0.9 % IV SOLN
10.0000 mL/h | Freq: Once | INTRAVENOUS | Status: AC
  Administered 2017-07-17: 10 mL/h via INTRAVENOUS

## 2017-07-14 MED ORDER — SODIUM CHLORIDE 0.9 % IV BOLUS (SEPSIS)
1000.0000 mL | Freq: Once | INTRAVENOUS | Status: AC
  Administered 2017-07-14: 1000 mL via INTRAVENOUS

## 2017-07-14 MED ORDER — ACETAMINOPHEN 325 MG PO TABS
650.0000 mg | ORAL_TABLET | Freq: Four times a day (QID) | ORAL | Status: DC | PRN
  Administered 2017-08-04 – 2017-08-06 (×2): 650 mg via ORAL
  Filled 2017-07-14 (×4): qty 2

## 2017-07-14 MED ORDER — METHYLPREDNISOLONE SODIUM SUCC 125 MG IJ SOLR
60.0000 mg | INTRAMUSCULAR | Status: DC
  Administered 2017-07-14: 60 mg via INTRAVENOUS
  Filled 2017-07-14: qty 2

## 2017-07-14 MED ORDER — ONDANSETRON HCL 4 MG/2ML IJ SOLN
4.0000 mg | Freq: Four times a day (QID) | INTRAMUSCULAR | Status: DC | PRN
  Administered 2017-07-27 – 2017-08-10 (×4): 4 mg via INTRAVENOUS
  Filled 2017-07-14 (×4): qty 2

## 2017-07-14 MED ORDER — SODIUM CHLORIDE 0.9 % IV SOLN
INTRAVENOUS | Status: DC
  Administered 2017-07-14: 22:00:00 via INTRAVENOUS

## 2017-07-14 MED ORDER — AZITHROMYCIN 500 MG IV SOLR
500.0000 mg | INTRAVENOUS | Status: DC
  Administered 2017-07-15 – 2017-07-17 (×4): 500 mg via INTRAVENOUS
  Filled 2017-07-14 (×5): qty 500

## 2017-07-14 MED ORDER — ONDANSETRON HCL 4 MG PO TABS: 4.0000 mg | ORAL_TABLET | Freq: Four times a day (QID) | ORAL | Status: DC | PRN

## 2017-07-14 NOTE — Consult Note (Addendum)
Name: Kayla Boyer MRN: [id number] DOB: December 07, 1955    ADMISSION DATE:  07/14/2017 CONSULTATION DATE: 07/14/2017  REFERRING MD : Dr. Darvin Neighbours  CHIEF COMPLAINT: Weakness   BRIEF PATIENT DESCRIPTION:  73 F hx COPD with recent hosp for GIB due to ulcers presenting with continued drop in Hgb 11 to 9.0.  Pt admitted to stepdown unit with acute on chronic hypercapnic hypoxic respiratory failure and acute encephalopathy secondary to AECOPD requiring Bipap   SIGNIFICANT EVENTS  03/15-Pt admitted to stepdown unit   STUDIES:  None   HISTORY OF PRESENT ILLNESS:   This is a 62 yo female with a PMH of Hyperlipidemia, GI Bleed (recent d/c from [facility] on 06/08/17 following workup and treatment EGD revealed gastric ulcers with a flat pigmented spot treated with bipolar cautery and biopsied results negative, planned for outpatient colonoscopy in 3 months), COPD, Chronic O2 @2L , and HTN.  She presented to [facility] ER via EMS on 03/15 from home with c/o worsening weakness, productive cough, and black tarry stools. Upon EMS arrival the pt was found slumped in bed, and drooling however able to answer questions..  The pt was wearing her home O2 at 3L via nasal canula, however O2 sats were in the low 90's and pt c/o shortness of breath with intermittent wheezing.  She also endorsed orthopnea, nausea, and vomiting.  She recently presented to the ER on 07/05/17 with right flank pain, sudden onset of gross hematuria, and dizziness.  Therefore, CT Renal Study ordered revealing several kidney stones but no ureteral stones.  At that time her hemoglobin remained unchanged at 11.2 and UA revealed possible UTI urine culture obtained.  She was discharged from the ER and instructed to complete a course of keflex until urine culture results came back.  During current presentation hgb dropped from 11.2 to 9.7 and pt guaiac positive. Pt also noted to be increasingly lethargic, therefore ABG obtained revealing hypercapnia pt placed on Bipap.   She was subsequently admitted to the stepdown unit by hospitalist team for futher workup and treatment.    PAST MEDICAL HISTORY :   has a past medical history of Emphysema of lung ([place]), GI bleed, and HLD (hyperlipidemia).  has a past surgical history that includes No past surgeries and Oophorectomy (Left). Prior to Admission medications   Medication Sig Start Date End Date Taking? Authorizing Provider  feeding supplement, ENSURE ENLIVE, (ENSURE ENLIVE) LIQD Take 237 mLs by mouth 2 (two) times daily between meals. 06/08/17   Vaughan Basta, MD  ferrous sulfate 325 (65 FE) MG tablet Take 1 tablet (325 mg total) by mouth 2 (two) times daily with a meal. 06/08/17   Vaughan Basta, MD  pantoprazole (PROTONIX) 40 MG tablet Take 1 tablet (40 mg total) by mouth 2 (two) times daily before a meal. 06/08/17   Vaughan Basta, MD  vitamin B-12 (CYANOCOBALAMIN) 1000 MCG tablet Take 1,000 mcg by mouth daily.    [provider]  vitamin C (ASCORBIC ACID) 500 MG tablet Take 500 mg by mouth daily.    [provider]   No Known Allergies  FAMILY HISTORY:  Family history is unknown by patient. SOCIAL HISTORY:  reports that  has never smoked. she has never used smokeless tobacco. She reports that she does not drink alcohol or use drugs.  REVIEW OF SYSTEMS: Positives in BOLD  Constitutional: Negative for fever, chills, weight loss, malaise/fatigue and diaphoresis.  HENT: Negative for hearing loss, ear pain, nosebleeds, congestion, sore throat, neck pain, tinnitus and ear  discharge.   Eyes: Negative for blurred vision, double vision, photophobia, pain, discharge and redness.  Respiratory: cough, hemoptysis, sputum production, shortness of breath, wheezing and stridor.   Cardiovascular: Negative for chest pain, palpitations, orthopnea, claudication, leg swelling and PND.  Gastrointestinal: heartburn, nausea, vomiting, abdominal pain, diarrhea, constipation, blood in stool and  melena.  Genitourinary: Negative for dysuria, urgency, frequency, hematuria and flank pain.  Musculoskeletal: Negative for myalgias, back pain, joint pain and falls.  Skin: Negative for itching and rash.  Neurological: Negative for dizziness, tingling, tremors, sensory change, speech change, focal weakness, seizures, loss of consciousness, weakness and headaches.  Endo/Heme/Allergies: Negative for environmental allergies and polydipsia. Does not bruise/bleed easily.  SUBJECTIVE:  c/o shortness of breath when Bipap removed   VITAL SIGNS: Pulse Rate:  [110-125] 115 (03/15 1930) Resp:  [26-42] 26 (03/15 1930) BP: (143-161)/(82-126) 155/90 (03/15 1930) SpO2:  [94 %-99 %] 98 % (03/15 1930) Weight:  [60.8 kg (134 lb)] 60.8 kg (134 lb) (03/15 1630)  PHYSICAL EXAMINATION: General: well developed, well nourished female, NAD on Bipap  Neuro: alert and oriented, follows commands, PERRL  HEENT: supple, no JVD  Cardiovascular: sinus tach, no R/G Lungs: diminished throughout, even, non labored  Abdomen: +BS x4, soft, obese, non tender, non distended  Musculoskeletal: bilateral foot drop, no edema  Skin: intact no rashes or lesions   Recent Labs  Lab 07/14/17 1652  NA 144  K 4.0  CL 86*  CO2 50*  BUN 19  CREATININE <0.30*  GLUCOSE 160*   Recent Labs  Lab 07/14/17 1652  HGB 9.7*  HCT 31.3*  WBC 14.8*  PLT 235   Dg Chest Portable 1 View  Result Date: 07/14/2017 CLINICAL DATA:  Shortness of breath. EXAM: PORTABLE CHEST 1 VIEW COMPARISON:  06/05/2017. FINDINGS: Mediastinum and hilar structures normal. Stable mild cardiomegaly with normal pulmonary vascularity. Low lung volumes with mild basilar atelectasis. Stable bilateral pleural thickening consistent scarring. Degenerative changes thoracic spine. IMPRESSION: Low lung volumes with mild basilar atelectasis and/or pleuroparenchymal scarring again noted. Stable mild cardiomegaly. Chest is stable from prior exam. Electronically Signed    By: [place]   On: 07/14/2017 17:13    ASSESSMENT / PLAN: Gastrointestinal Bleeding  Acute on chronic hypercapnic hypoxic respiratory failure secondary to AECOPD  Acute encephalopathy secondary to hypercapnia  Nausea and Vomiting  Hx: Hyperlipidemia   P: Supplemental O2 for dyspnea, hypoxia, and/or encephalopathy  Continue bronchodilator therapy IV and nebulized steroids  Continuous telemetry monitoring Maintain map >65 with fluid resuscitation  NS @100  ml/hr  Serial H&H Transfuse for hgb <7  Monitor for s/sx of bleeding  Continue protonix gtt  SCD's for VTE prophylaxis avoid chemical prophylaxis  GI consulted appreciate input Trend BMP  Replace electrolytes as indicated  Monitor UOP  UA pending  Trend WBC and monitor fever curve IV azithromycin   Prn zofran for nausea and vomiting   Marda Stalker, [place] Pager [phone] (please enter 7 digits) PCCM Consult Pager [phone] (please enter 7 digits)

## 2017-07-14 NOTE — ED Notes (Signed)
TRIAGE NOTE:   Pt to ED from home after family reports increased weakness. EMS reports pt was seen for hematuria in the ED and sent home. Pts family now reports noting blood in stool. Pt is slumped in bed at this time and is weak. Pt is able to answer questions but is drooling.  EMS reports pt is on 2L O2 at home and was in low 90s reporting SOB. Pt on 3L upon arrival and continuing to verbalize SOB but is now in the mid 90s. Pt unable to tolerate lying flat in bed and prefers be slumped over sitting up.

## 2017-07-14 NOTE — H&P (Addendum)
[facility] [place] NAME: Kayla Boyer    MR#:  [id number]  DATE OF BIRTH:  05-15-1955  DATE OF ADMISSION:  07/14/2017  PRIMARY CARE PHYSICIAN: Patient, [name]   REQUESTING/REFERRING PHYSICIAN:   CHIEF COMPLAINT:  weakness and and dark stools  HISTORY OF PRESENT ILLNESS: Kayla Boyer  is a 62 y.o. female with a known history of gastrointestinal bleeding, gastric ulcer, hyperlipidemia, emphysema of lung was referred by ER physician Dr.malinda for weakness and dark stools.  Patient presented to the emergency room with generalized weakness.  She has noticed a dark black stools for the last few days she was recently in our hospital last month and had an endoscopy which showed gastric ulcers she was discharged on oral proton pump inhibitors.  Patient is lethargic and weak.  Has nausea and vomitings.  No complaints of any chest pain, abdominal pain.  No fever and chills.Hemoglobin dropped from 11.7 to 9.7. Stool for occult blood is positive.  PAST MEDICAL HISTORY:   Past Medical History:  Diagnosis Date  . Emphysema of lung ([place])   . GI bleed   . HLD (hyperlipidemia)     PAST SURGICAL HISTORY:  Past Surgical History:  Procedure Laterality Date  . NO PAST SURGERIES    . OOPHORECTOMY Left     SOCIAL HISTORY:  Social History   Tobacco Use  . Smoking status: Never Smoker  . Smokeless tobacco: Never Used  Substance Use Topics  . Alcohol use: No    Frequency: Never    FAMILY HISTORY:  Family History  Family history unknown: Yes    DRUG ALLERGIES: No Known Allergies  REVIEW OF SYSTEMS:   CONSTITUTIONAL: No fever,has  fatigue and weakness.  EYES: No blurred or double vision.  EARS, NOSE, AND THROAT: No tinnitus or ear pain.  RESPIRATORY: No cough, shortness of breath, wheezing or hemoptysis.  CARDIOVASCULAR: No chest pain, orthopnea, edema.  GASTROINTESTINAL: Has nausea, vomiting,  No diarrhea or abdominal pain.  Has  dark stools GENITOURINARY: No dysuria, hematuria.  ENDOCRINE: No polyuria, nocturia,  HEMATOLOGY: No anemia, easy bruising or bleeding SKIN: No rash or lesion. MUSCULOSKELETAL: No joint pain or arthritis.   NEUROLOGIC: No tingling, numbness, weakness.  PSYCHIATRY: No anxiety or depression.   MEDICATIONS AT HOME:  Prior to Admission medications   Medication Sig Start Date End Date Taking? Authorizing Provider  feeding supplement, ENSURE ENLIVE, (ENSURE ENLIVE) LIQD Take 237 mLs by mouth 2 (two) times daily between meals. 06/08/17   Vaughan Basta, MD  ferrous sulfate 325 (65 FE) MG tablet Take 1 tablet (325 mg total) by mouth 2 (two) times daily with a meal. 06/08/17   Vaughan Basta, MD  pantoprazole (PROTONIX) 40 MG tablet Take 1 tablet (40 mg total) by mouth 2 (two) times daily before a meal. 06/08/17   Vaughan Basta, MD  vitamin B-12 (CYANOCOBALAMIN) 1000 MCG tablet Take 1,000 mcg by mouth daily.    [provider]  vitamin C (ASCORBIC ACID) 500 MG tablet Take 500 mg by mouth daily.    [provider]      PHYSICAL EXAMINATION:   VITAL SIGNS: BP : 149/91 mm of Hg, PR : 111/min , Sat 94%  GENERAL:  62 y.o.-year-old patient lying in the bed on oxygen via nasal canula.  EYES: Pupils equal, round, reactive to light and accommodation. No scleral icterus. Extraocular muscles intact.  HEENT: Head atraumatic, normocephalic. Oropharynx dry and nasopharynx clear.  NECK:  Supple, no jugular venous distention. No thyroid enlargement, no tenderness.  LUNGS: Normal breath sounds bilaterally, no wheezing, rales,rhonchi or crepitation. No use of accessory muscles of respiration.  CARDIOVASCULAR: S1, S2 tachycardia. No murmurs, rubs, or gallops.  ABDOMEN: Soft, nontender, nondistended. Bowel sounds present. No organomegaly or mass.  EXTREMITIES: No pedal edema, cyanosis, or clubbing.  NEUROLOGIC: Cranial nerves II through XII are intact. Muscle strength 5/5 in  all extremities. Sensation intact. Gait not checked.  PSYCHIATRIC: The patient is alert and oriented x 3.  SKIN: No obvious rash, lesion, or ulcer.   LABORATORY PANEL:   CBC Recent Labs  Lab 07/14/17 1652  WBC 14.8*  HGB 9.7*  HCT 31.3*  PLT 235  MCV 92.2  MCH 28.4  MCHC 30.8*  RDW 18.5*  LYMPHSABS 0.6*  MONOABS 0.6  EOSABS 0.1  BASOSABS 0.0   ------------------------------------------------------------------------------------------------------------------  Chemistries  Recent Labs  Lab 07/14/17 1652  NA 144  K 4.0  CL 86*  CO2 50*  GLUCOSE 160*  BUN 19  CREATININE <0.30*  CALCIUM 9.8  AST 17  ALT 13*  ALKPHOS 48  BILITOT 0.5   ------------------------------------------------------------------------------------------------------------------ CrCl cannot be calculated (This lab value cannot be used to calculate CrCl because it is not a number: <0.30). ------------------------------------------------------------------------------------------------------------------ No results for input(s): TSH, T4TOTAL, T3FREE, THYROIDAB in the last 72 hours.  Invalid input(s): FREET3   Coagulation profile No results for input(s): INR, PROTIME in the last 168 hours. ------------------------------------------------------------------------------------------------------------------- No results for input(s): DDIMER in the last 72 hours. -------------------------------------------------------------------------------------------------------------------  Cardiac Enzymes No results for input(s): CKMB, TROPONINI, MYOGLOBIN in the last 168 hours.  Invalid input(s): CK ------------------------------------------------------------------------------------------------------------------ Invalid input(s): POCBNP  ---------------------------------------------------------------------------------------------------------------  Urinalysis    Component Value Date/Time   COLORURINE YELLOW  (A) 07/05/2017 2303   APPEARANCEUR HAZY (A) 07/05/2017 2303   LABSPEC 1.002 (L) 07/05/2017 2303   PHURINE 6.0 07/05/2017 2303   GLUCOSEU NEGATIVE 07/05/2017 2303   HGBUR LARGE (A) 07/05/2017 2303   BILIRUBINUR NEGATIVE 07/05/2017 2303   KETONESUR NEGATIVE 07/05/2017 2303   PROTEINUR NEGATIVE 07/05/2017 2303   NITRITE NEGATIVE 07/05/2017 2303   LEUKOCYTESUR NEGATIVE 07/05/2017 2303     RADIOLOGY: Dg Chest Portable 1 View  Result Date: 07/14/2017 CLINICAL DATA:  Shortness of breath. EXAM: PORTABLE CHEST 1 VIEW COMPARISON:  06/05/2017. FINDINGS: Mediastinum and hilar structures normal. Stable mild cardiomegaly with normal pulmonary vascularity. Low lung volumes with mild basilar atelectasis. Stable bilateral pleural thickening consistent scarring. Degenerative changes thoracic spine. IMPRESSION: Low lung volumes with mild basilar atelectasis and/or pleuroparenchymal scarring again noted. Stable mild cardiomegaly. Chest is stable from prior exam. Electronically Signed   By: Marcello Moores  Register   On: 07/14/2017 17:13    EKG: Orders placed or performed during the hospital encounter of 07/14/17  . ED EKG  . ED EKG  . EKG 12-Lead  . EKG 12-Lead    IMPRESSION AND PLAN:  69 yr-year-old female patient female patient with history of emphysema, GI bleed, hyperlipidemia presented to the emergency room with generalized weakness and dark stools.  1 acute gastrointestinal bleeding. Admit patient to  medical floor IV fluid hydration PRBC transfusion  gastroenterology consultation Serial hemoglobin hematocrit monitoring IV Protonix drip  N.p.o.  2.  Symptomatic anemia Responsible for weakness PRBC transfusion as needed  3.  Gastric ulcers IV Protonix drip  4.  DVT prophylaxis Sequential compression device to lower extremities  All the records are reviewed and case discussed with ED provider. Management plans discussed with the patient, family and they  are in agreement.  CODE  STATUS:FULL CODE Code Status History    Date Active Date Inactive Code Status Order ID Comments User Context   06/05/2017 16:54 06/08/2017 18:20 Full Code [id number]  Bettey Costa, MD Inpatient   11/01/2016 01:21 11/09/2016 20:44 Full Code [id number]  Lance Coon, MD Inpatient       TOTAL TIME TAKING CARE OF THIS PATIENT: 52 minutes.    Saundra Shelling M.D on 07/14/2017 at 5:56 PM  Between 7am to 6pm - Pager - [phone]  After 6pm go to [email or web address] - password [email or web address] [facility]  Office  [phone]  CC: Primary care physician; Patient, [name]

## 2017-07-14 NOTE — ED Provider Notes (Addendum)
[facility] Emergency Department Provider Note   ____________________________________________   First MD Initiated Contact with Patient 07/14/17 1623     (approximate)  I have reviewed the triage vital signs and the nursing notes.   HISTORY  Chief Complaint Weakness  Chief complaint shortness of breath  HPI Kayla Boyer is a 62 y.o. female . Comes in complaining she can't breathe. She looks pale and she is tachycardic to 124 sitting in the bed not moving. She says she is short of breath. She is not coughing doesn't have a fever. Her lungs are clear. Lips looked very pale. She has no pain.she has a history of GI bleed and hematuria.   Past Medical History:  Diagnosis Date  . Emphysema of lung ([place])   . GI bleed   . HLD (hyperlipidemia)     Patient Active Problem List   Diagnosis Date Noted  . GI bleed 07/14/2017  . Anemia 06/05/2017  . Fall 10/31/2016  . Tachycardia 10/31/2016  . HLD (hyperlipidemia) 10/31/2016    Past Surgical History:  Procedure Laterality Date  . NO PAST SURGERIES    . OOPHORECTOMY Left     Prior to Admission medications   Medication Sig Start Date End Date Taking? Authorizing Provider  feeding supplement, ENSURE ENLIVE, (ENSURE ENLIVE) LIQD Take 237 mLs by mouth 2 (two) times daily between meals. 06/08/17   Vaughan Basta, MD  ferrous sulfate 325 (65 FE) MG tablet Take 1 tablet (325 mg total) by mouth 2 (two) times daily with a meal. 06/08/17   Vaughan Basta, MD  pantoprazole (PROTONIX) 40 MG tablet Take 1 tablet (40 mg total) by mouth 2 (two) times daily before a meal. 06/08/17   Vaughan Basta, MD  vitamin B-12 (CYANOCOBALAMIN) 1000 MCG tablet Take 1,000 mcg by mouth daily.    [provider]  vitamin C (ASCORBIC ACID) 500 MG tablet Take 500 mg by mouth daily.    [provider]    Allergies Patient has no known allergies.  Family History  Family history unknown: Yes     Social History Social History   Tobacco Use  . Smoking status: Never Smoker  . Smokeless tobacco: Never Used  Substance Use Topics  . Alcohol use: No    Frequency: Never  . Drug use: No    Review of Systems  Constitutional: No fever/chills Eyes: No visual changes. ENT: No sore throat. Cardiovascular: Denies chest pain. Respiratory:  shortness of breath. Gastrointestinal: No abdominal pain.  No nausea, no vomiting.  No diarrhea.  No constipation. Genitourinary: Negative for dysuria. Musculoskeletal: Negative for back pain. Skin: Negative for rash. Neurological: Negative for headaches, focal weakness  ____________________________________________   PHYSICAL EXAM:  VITAL SIGNS: ED Triage Vitals  Enc Vitals Group     BP      Pulse      Resp      Temp      Temp src      SpO2      Weight      Height      Head Circumference      Peak Flow      Pain Score      Pain Loc      Pain Edu?      Excl. in [place]?     Constitutional: Alert and oriented. Well appearing and in no acute distress. Eyes: Conjunctivae are pale Head: Atraumatic. Nose: No congestion/rhinnorhea. Mouth/Throat: Mucous membranes are moist.  Oropharynx non-erythematous.or pale Neck: No  stridor.  Cardiovascular: rapid rate, regular rhythm. Grossly normal heart sounds.  Good peripheral circulation. Respiratory: Normal respiratory effort.  No retractions. Lungs CTAB. Gastrointestinal: Soft and nontender. No distention. No abdominal bruits.  rectal: Markedly Hemoccult positive Musculoskeletal: No lower extremity tenderness nor edema.  No joint effusions. Neurologic:  Normal speech and language. No gross focal neurologic deficits are appreciated.  Skin:  Skin is warm, dry and intact. No rash noted. Psychiatric: Mood and affect are normal. Speech and behavior are normal.  ____________________________________________   LABS (all labs ordered are listed, but only abnormal results are displayed)  Labs  Reviewed  BASIC METABOLIC PANEL - Abnormal; Notable for the following components:      Result Value   Chloride 86 (*)    CO2 50 (*)    Glucose, Bld 160 (*)    Creatinine, Ser <0.30 (*)    All other components within normal limits  HEPATIC FUNCTION PANEL - Abnormal; Notable for the following components:   ALT 13 (*)    All other components within normal limits  CBC WITH DIFFERENTIAL/PLATELET - Abnormal; Notable for the following components:   WBC 14.8 (*)    RBC 3.40 (*)    Hemoglobin 9.7 (*)    HCT 31.3 (*)    MCHC 30.8 (*)    RDW 18.5 (*)    Neutro Abs 13.6 (*)    Lymphs Abs 0.6 (*)    All other components within normal limits  PROTIME-INR - Abnormal; Notable for the following components:   Prothrombin Time 11.1 (*)    All other components within normal limits  HEMOGLOBIN AND HEMATOCRIT, BLOOD - Abnormal; Notable for the following components:   Hemoglobin 9.2 (*)    HCT 30.6 (*)    All other components within normal limits  BLOOD GAS, ARTERIAL - Abnormal; Notable for the following components:   pH, Arterial 7.25 (*)    pCO2 arterial 120 (*)    pO2, Arterial 70 (*)    All other components within normal limits  APTT  HEMOGLOBIN AND HEMATOCRIT, BLOOD  URINALYSIS, ROUTINE W REFLEX MICROSCOPIC  HEMOGLOBIN AND HEMATOCRIT, BLOOD  BASIC METABOLIC PANEL  INFLUENZA PANEL BY PCR (TYPE A & B)  HEMOGLOBIN AND HEMATOCRIT, BLOOD  PREPARE RBC (CROSSMATCH)  TYPE AND SCREEN   ____________________________________________  EKG  EKG head and interpreted by me shows sinus tachycardia rate of 116 normal axis nonspecific ST-T wave changes especially laterally. ____________________________________________  RADIOLOGY  ED MD interpretation:  chest x-ray left lung is slightly hypoinflated left diaphragm is obscured but I do not see any obvious infiltrate  Official radiology report(s): Dg Chest Portable 1 View  Result Date: 07/14/2017 CLINICAL DATA:  Shortness of breath. EXAM: PORTABLE  CHEST 1 VIEW COMPARISON:  06/05/2017. FINDINGS: Mediastinum and hilar structures normal. Stable mild cardiomegaly with normal pulmonary vascularity. Low lung volumes with mild basilar atelectasis. Stable bilateral pleural thickening consistent scarring. Degenerative changes thoracic spine. IMPRESSION: Low lung volumes with mild basilar atelectasis and/or pleuroparenchymal scarring again noted. Stable mild cardiomegaly. Chest is stable from prior exam. Electronically Signed   By: Marcello Moores  Register   On: 07/14/2017 17:13    ____________________________________________   PROCEDURES  Procedure(s) performed:   Procedures  Critical Care performed:   ____________________________________________   INITIAL IMPRESSION / ASSESSMENT AND PLAN / ED COURSE patient is weak short of breath tachycardic and very pale. Her H&H is dropping. Her stool is markedly Hemoccult positive. We'll set her up for transfusion and plan on admitting her. She  will see GI.          ____________________________________________   FINAL CLINICAL IMPRESSION(S) / ED DIAGNOSES  Final diagnoses:  Gastrointestinal hemorrhage with melena  Tachycardia     ED Discharge Orders    None       Note:  This document was prepared using Dragon voice recognition software and may include unintentional dictation errors.    Nena Polio, MD 07/14/17 2352    Nena Polio, MD 07/14/17 [phone]

## 2017-07-14 NOTE — Progress Notes (Signed)
Transported pt to ICU 7 on Bipap without incident. Report given to ICU RT.

## 2017-07-14 NOTE — Progress Notes (Signed)
Seen and examined.  Sitting propped up in bed, drowsy but wakes up on calling her name.  Respirations 30/min and shallow. Decreased air entry bilaterally with no wheezing. S1-S2, tachycardia. Abdomen nontender. No lower extremity edema.  *Acute on chronic hypercapnic respiratory failure ABG stat ordered. Patient seems to have chronically elevated PCO2 but at this time it is significantly elevated at 120 with pH of 7.25.  Will be admitted to stepdown and started on BiPAP support.  Discussed with ICU attending. We will also put her on IV steroids and scheduled nebulizers for COPD.  Chest x-ray showed no acute changes. Patient is full code.  *Upper GI bleed.  Patient is a Protonix drip.  Blood transfusion ordered.  Serial hemoglobin. GI consult in place.  Patient has worsening respiratory status.  Critically ill. Critical care time spent is 35 minutes.

## 2017-07-15 ENCOUNTER — Inpatient Hospital Stay: Payer: Medicaid Other

## 2017-07-15 DIAGNOSIS — J9622 Acute and chronic respiratory failure with hypercapnia: Secondary | ICD-10-CM

## 2017-07-15 DIAGNOSIS — J9621 Acute and chronic respiratory failure with hypoxia: Secondary | ICD-10-CM

## 2017-07-15 LAB — BASIC METABOLIC PANEL
ANION GAP: 9 (ref 5–15)
BUN: 13 mg/dL (ref 6–20)
CALCIUM: 9.6 mg/dL (ref 8.9–10.3)
CHLORIDE: 92 mmol/L — AB (ref 101–111)
CO2: 46 mmol/L — AB (ref 22–32)
Creatinine, Ser: <0.3 mg/dL — ABNORMAL LOW (ref 0.44–1.00)
GLUCOSE: 103 mg/dL — AB (ref 65–99)
POTASSIUM: 4.1 mmol/L (ref 3.5–5.1)
Sodium: 147 mmol/L — ABNORMAL HIGH (ref 135–145)

## 2017-07-15 LAB — CBC WITH DIFFERENTIAL/PLATELET
BASOS PCT: 0 %
Basophils Absolute: 0 10*3/uL (ref 0–0.1)
Eosinophils Absolute: 0 10*3/uL (ref 0–0.7)
Eosinophils Relative: 0 %
HEMATOCRIT: 28.6 % — AB (ref 35.0–47.0)
HEMOGLOBIN: 8.7 g/dL — AB (ref 12.0–16.0)
LYMPHS ABS: 1.1 10*3/uL (ref 1.0–3.6)
Lymphocytes Relative: 11 %
MCH: 28.4 pg (ref 26.0–34.0)
MCHC: 30.4 g/dL — AB (ref 32.0–36.0)
MCV: 93.4 fL (ref 80.0–100.0)
MONOS PCT: 11 %
Monocytes Absolute: 1.1 10*3/uL — ABNORMAL HIGH (ref 0.2–0.9)
NEUTROS ABS: 7.8 10*3/uL — AB (ref 1.4–6.5)
NEUTROS PCT: 78 %
Platelets: 179 10*3/uL (ref 150–440)
RBC: 3.06 MIL/uL — ABNORMAL LOW (ref 3.80–5.20)
RDW: 18.1 % — ABNORMAL HIGH (ref 11.5–14.5)
WBC: 10 10*3/uL (ref 3.6–11.0)

## 2017-07-15 LAB — HEMOGLOBIN AND HEMATOCRIT, BLOOD
HCT: 31 % — ABNORMAL LOW (ref 35.0–47.0)
HCT: 28.3 % — ABNORMAL LOW (ref 35.0–47.0)
HEMOGLOBIN: 8.6 g/dL — AB (ref 12.0–16.0)
Hemoglobin: 9.7 g/dL — ABNORMAL LOW (ref 12.0–16.0)

## 2017-07-15 LAB — MRSA PCR SCREENING: MRSA BY PCR: NEGATIVE

## 2017-07-15 LAB — FERRITIN: Ferritin: 57 ng/mL (ref 11–307)

## 2017-07-15 LAB — INFLUENZA PANEL BY PCR (TYPE A & B)
INFLBPCR: NEGATIVE
Influenza A By PCR: NEGATIVE

## 2017-07-15 MED ORDER — BUDESONIDE 0.25 MG/2ML IN SUSP
0.2500 mg | Freq: Two times a day (BID) | RESPIRATORY_TRACT | Status: DC
  Administered 2017-07-15 – 2017-07-20 (×11): 0.25 mg via RESPIRATORY_TRACT
  Filled 2017-07-15 (×11): qty 2

## 2017-07-15 MED ORDER — PREDNISONE 20 MG PO TABS
40.0000 mg | ORAL_TABLET | Freq: Every day | ORAL | Status: DC
  Administered 2017-07-16 – 2017-07-18 (×2): 40 mg via ORAL
  Filled 2017-07-15 (×3): qty 2

## 2017-07-15 MED ORDER — LORAZEPAM 2 MG/ML IJ SOLN
0.5000 mg | INTRAMUSCULAR | Status: DC | PRN
  Administered 2017-07-15: 0.5 mg via INTRAVENOUS
  Filled 2017-07-15: qty 1

## 2017-07-15 MED ORDER — SODIUM CHLORIDE 0.9 % IV SOLN
510.0000 mg | Freq: Once | INTRAVENOUS | Status: AC
  Administered 2017-07-15: 510 mg via INTRAVENOUS
  Filled 2017-07-15: qty 17

## 2017-07-15 MED ORDER — LACTATED RINGERS IV SOLN
INTRAVENOUS | Status: DC
  Administered 2017-07-15 (×2): via INTRAVENOUS

## 2017-07-15 MED ORDER — IPRATROPIUM-ALBUTEROL 0.5-2.5 (3) MG/3ML IN SOLN: 3.0000 mL | Freq: Four times a day (QID) | RESPIRATORY_TRACT | Status: DC | PRN

## 2017-07-15 NOTE — Plan of Care (Signed)
Patient on BiPap. No evidence of bleeding during shift.  Good urination.  No c/o pain.  Patient sitting up in bed in high fowler's position, resting.  Patient able to make needs known.  Unable to tolerate removal of BiPap mask.  Will continue to monitor.

## 2017-07-15 NOTE — Progress Notes (Signed)
[facility] at [place] NAME: Kayla Boyer    MR#:  [id number]  DATE OF BIRTH:  [birth year]  SUBJECTIVE:  Patient came in with weakness found to have positive guaiac positive.  Admitted with slow GI bleed.  Became short of breath and hypercapnic transferred to ICU.  Currently on nasal cannula oxygen breathing comfortably.  Husband and niece in the room.  REVIEW OF SYSTEMS:   Review of Systems  Constitutional: Negative for chills, fever and weight loss.  HENT: Negative for ear discharge, ear pain and nosebleeds.   Eyes: Negative for blurred vision, pain and discharge.  Respiratory: Negative for sputum production, shortness of breath, wheezing and stridor.   Cardiovascular: Negative for chest pain, palpitations, orthopnea and PND.  Gastrointestinal: Positive for melena. Negative for abdominal pain, diarrhea, nausea and vomiting.  Genitourinary: Negative for frequency and urgency.  Musculoskeletal: Negative for back pain and joint pain.  Neurological: Positive for weakness. Negative for sensory change, speech change and focal weakness.  Psychiatric/Behavioral: Negative for depression and hallucinations. The patient is not nervous/anxious.    Tolerating Diet:npo Tolerating PT: pending  DRUG ALLERGIES:  No Known Allergies  VITALS:  Blood pressure (!) 101/53, pulse (!) 117, temperature 98.1 F (36.7 C), temperature source Axillary, resp. rate (!) 24, height 5' 2.5" (1.588 m), weight 60.8 kg (134 lb), SpO2 100 %.  PHYSICAL EXAMINATION:   Physical Exam  GENERAL:  62 y.o.-year-old patient lying in the bed with no acute distress.  EYES: Pupils equal, round, reactive to light and accommodation. No scleral icterus. Extraocular muscles intact.  HEENT: Head atraumatic, normocephalic. Oropharynx and nasopharynx clear.  NECK:  Supple, no jugular venous distention. No thyroid enlargement, no tenderness.  LUNGS: Normal breath sounds bilaterally, no  wheezing, rales, rhonchi. No use of accessory muscles of respiration.  CARDIOVASCULAR: S1, S2 normal. No murmurs, rubs, or gallops.  ABDOMEN: Soft, nontender, nondistended. Bowel sounds present. No organomegaly or mass.  EXTREMITIES: No cyanosis, clubbing or edema b/l.    NEUROLOGIC: Cranial nerves II through XII are intact. No focal Motor or sensory deficits b/l.   PSYCHIATRIC:  patient is alert and oriented x 3.  SKIN: No obvious rash, lesion, or ulcer.   LABORATORY PANEL:  CBC Recent Labs  Lab 07/14/17 1652  07/15/17 0912  WBC 14.8*  --   --   HGB 9.7*   < > 8.6*  HCT 31.3*   < > 28.3*  PLT 235  --   --    < > = values in this interval not displayed.    Chemistries  Recent Labs  Lab 07/14/17 1652 07/15/17 0315  NA 144 147*  K 4.0 4.1  CL 86* 92*  CO2 50* 46*  GLUCOSE 160* 103*  BUN 19 13  CREATININE <0.30* <0.30*  CALCIUM 9.8 9.6  AST 17  --   ALT 13*  --   ALKPHOS 48  --   BILITOT 0.5  --    Cardiac Enzymes No results for input(s): TROPONINI in the last 168 hours. RADIOLOGY:  Dg Chest Port 1 View  Result Date: 07/15/2017 CLINICAL DATA:  Acute respiratory failure. EXAM: PORTABLE CHEST 1 VIEW COMPARISON:  Chest x-ray from yesterday FINDINGS: The heart size and mediastinal contours are within normal limits. Normal pulmonary vascularity. Unchanged left greater than right bibasilar atelectasis and scarring at the costophrenic angles. No focal consolidation, pleural effusion, or pneumothorax. No acute osseous abnormality. IMPRESSION: Stable bibasilar atelectasis/scarring. Electronically Signed   By:  Titus Dubin M.D.   On: 07/15/2017 10:41   Dg Chest Portable 1 View  Result Date: 07/14/2017 CLINICAL DATA:  Shortness of breath. EXAM: PORTABLE CHEST 1 VIEW COMPARISON:  06/05/2017. FINDINGS: Mediastinum and hilar structures normal. Stable mild cardiomegaly with normal pulmonary vascularity. Low lung volumes with mild basilar atelectasis. Stable bilateral pleural  thickening consistent scarring. Degenerative changes thoracic spine. IMPRESSION: Low lung volumes with mild basilar atelectasis and/or pleuroparenchymal scarring again noted. Stable mild cardiomegaly. Chest is stable from prior exam. Electronically Signed   By: Marcello Moores  Register   On: 07/14/2017 17:13   ASSESSMENT AND PLAN:  Kayla Boyer  is a 62 y.o. female with a known history of gastrointestinal bleeding, gastric ulcer, hyperlipidemia, emphysema of lung was referred by ER physician Dr.malinda for weakness and dark stools.  Patient presented to the emergency room with generalized weakness.  She has noticed a dark black stools for the last few days   1 acute on chornic gastrointestinal bleeding. -Patient recently had a EGD that showed gastric ulcers.  She was lost to follow-up with primary care and gastroenterology. -PRBC transfusion as needed\ -gastroenterology consultation pending -Serial hemoglobin hematocrit monitoring -IV Protonix drip  N.p.o.  2.Symptomatic anemia with weakness PRBC transfusion as needed  3.    Acute on chronic hypoxic respiratory failure secondary to COPD exacerbation. -Patient currently is at baseline on nasal cannula oxygen.  No wheezing.  Received a dose of steroid.  Given ulcers I will hold off on more steroids.  Do scheduled nebulizer and inhalers.  4.  DVT prophylaxis Sequential compression device to lower extremities  Case discussed with Care Management/Social Worker. Management plans discussed with the patient, family and they are in agreement.  CODE STATUS: Full  DVT Prophylaxis: scd  TOTAL TIME TAKING CARE OF THIS PATIENT: 30 minutes.  >50% time spent on counselling and coordination of care  POSSIBLE D/C IN *few* DAYS, DEPENDING ON CLINICAL CONDITION.  Note: This dictation was prepared with Dragon dictation along with smaller phrase technology. Any transcriptional errors that result from this process are unintentional.  Fritzi Mandes M.D on  07/15/2017 at 3:34 PM  Between 7am to 6pm - Pager - [phone]  After 6pm go to [email or web address] - password [email or web address]  [facility]  Office  [phone]  CC: Primary care physician; Patient, No Pcp PerPatient ID: Kayla Boyer, female   DOB: 08-21-1955, 62 y.o.   MRN: [id number]

## 2017-07-15 NOTE — Consult Note (Signed)
Cephas Darby, MD [address]  [place]  [place], [place] [address]  Main: [phone]  Fax: [phone] Pager: [phone]   Consultation  Referring Provider:     No ref. provider found Primary Care Physician:  Patient, [name] Primary Gastroenterologist:  Dr. Sherri Sear      Reason for Consultation:     Iron def anemia, ?dark stools  Date of Admission:  07/14/2017 Date of Consultation:  07/15/2017         HPI:   Kayla Boyer is a 62 y.o. AA female with COPD on home O2, recent admission in 06/2017 with severe IDA secondary to PUD from long term usage of ASA 361m daily. Readmitted Yesterday with generalized weakness, and dark stools. During last admission, patient's ferritin was less than 10, received IV iron. She did not follow up with hematology as outpatient for parenteral iron therapy or with GI since discharge. She was discharged on oral iron and Protonix twice a day. Unclear if pt is taking these at home. Her recent hemoglobin was 11.3 on 07/05/2017. Decreased to 9.7 yesterday and 8.6 today. Her BUN is mildly elevated compared to baseline. She is admitted to ICU secondary to respiratory failure requiring BIPAP, started on Protonix drip and kept nothing by mouth. No witnessed episodes of melena, hematemesis or rectal bleeding.  NSAIDs: none  Antiplts/Anticoagulants/Anti thrombotics: none  GI Procedures:  EGD 06/06/2017 Impression: - Normal duodenal bulb and second portion of the duodenum. - Non-bleeding gastric ulcer with a flat pigmented spot (Forrest Class IIc). Treated with bipolar cautery. Biopsied. - Non-bleeding gastric ulcers with a clean ulcer base (Forrest Class III). - Most likely due to NSAID and ETOH use - Normal cardia, gastric fundus and gastric body. Biopsied. - Normal gastroesophageal junction and esophagus.  DIAGNOSIS:  A. 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  - NEGATIVE FOR H. PYLORI, INTESTINAL METAPLASIA, DYSPLASIA, AND  MALIGNANCY.   B. STOMACH, ANTRUM AND BODY; RANDOM COLD BIOPSY:  - VERY MILD CHRONIC INACTIVE GASTRITIS.  - NEGATIVE FOR H. PYLORI, INTESTINAL METAPLASIA, DYSPLASIA, AND  MALIGNANCY.    Past Medical History:  Diagnosis Date  . Emphysema of lung ([place]   . GI bleed   . HLD (hyperlipidemia)     Past Surgical History:  Procedure Laterality Date  . NO PAST SURGERIES    . OOPHORECTOMY Left     Prior to Admission medications   Medication Sig Start Date End Date Taking? Authorizing Provider  feeding supplement, ENSURE ENLIVE, (ENSURE ENLIVE) LIQD Take 237 mLs by mouth 2 (two) times daily between meals. 06/08/17   VVaughan Basta MD  ferrous sulfate 325 (65 FE) MG tablet Take 1 tablet (325 mg total) by mouth 2 (two) times daily with a meal. 06/08/17   VVaughan Basta MD  pantoprazole (PROTONIX) 40 MG tablet Take 1 tablet (40 mg total) by mouth 2 (two) times daily before a meal. 06/08/17   VVaughan Basta MD  vitamin B-12 (CYANOCOBALAMIN) 1000 MCG tablet Take 1,000 mcg by mouth daily.    [provider]  vitamin C (ASCORBIC ACID) 500 MG tablet Take 500 mg by mouth daily.    [provider]    Current Facility-Administered Medications:  .  0.9 %  sodium chloride infusion, 10 mL/hr, Intravenous, Once, MNena Polio MD .  acetaminophen (TYLENOL) tablet 650 mg, 650 mg, Oral, Q6H PRN **OR** acetaminophen (TYLENOL) suppository  650 mg, 650 mg, Rectal, Q6H PRN, Pyreddy, Pavan, MD .  azithromycin (ZITHROMAX) 500 mg in sodium chloride 0.9 % 250 mL IVPB, 500 mg, Intravenous, Q24H, Blakeney, Dreama Saa, NP, Stopped at 07/15/17 0100 .  budesonide (PULMICORT) nebulizer solution 0.25 mg, 0.25 mg, Nebulization, BID, Awilda Bill, NP, 0.25 mg at 07/15/17 0715 .  ferumoxytol (FERAHEME) 510 mg in sodium chloride 0.9 % 100 mL IVPB, 510 mg, Intravenous, Once, Vanga, Tally Due,  MD .  ipratropium-albuterol (DUONEB) 0.5-2.5 (3) MG/3ML nebulizer solution 3 mL, 3 mL, Nebulization, Q6H, Sudini, Srikar, MD, 3 mL at 07/15/17 0715 .  ipratropium-albuterol (DUONEB) 0.5-2.5 (3) MG/3ML nebulizer solution 3 mL, 3 mL, Nebulization, Q6H PRN, Awilda Bill, NP .  lactated ringers infusion, , Intravenous, Continuous, Awilda Bill, NP, Last Rate: 100 mL/hr at 07/15/17 1121 .  LORazepam (ATIVAN) injection 0.5 mg, 0.5 mg, Intravenous, Q4H PRN, Conforti, John, DO, 0.5 mg at 07/15/17 1019 .  methylPREDNISolone sodium succinate (SOLU-MEDROL) 125 mg/2 mL injection 60 mg, 60 mg, Intravenous, Q24H, Sudini, Srikar, MD, 60 mg at 07/14/17 2019 .  metoprolol tartrate (LOPRESSOR) injection 2.5-5 mg, 2.5-5 mg, Intravenous, Q6H PRN, Awilda Bill, NP, 2.5 mg at 07/15/17 1321 .  ondansetron (ZOFRAN) tablet 4 mg, 4 mg, Oral, Q6H PRN **OR** ondansetron (ZOFRAN) injection 4 mg, 4 mg, Intravenous, Q6H PRN, Pyreddy, Pavan, MD .  pantoprazole (PROTONIX) 80 mg in sodium chloride 0.9 % 250 mL (0.32 mg/mL) infusion, 8 mg/hr, Intravenous, Continuous, Pyreddy, Pavan, MD, Last Rate: 25 mL/hr at 07/15/17 0733, 8 mg/hr at 07/15/17 6244  Family History  Family history unknown: Yes     Social History   Tobacco Use  . Smoking status: Never Smoker  . Smokeless tobacco: Never Used  Substance Use Topics  . Alcohol use: No    Frequency: Never  . Drug use: No    Allergies as of 07/14/2017  . (No Known Allergies)    Review of Systems:    All systems reviewed and negative except where noted in HPI.   Physical Exam:  Vital signs in last 24 hours: Temp:  [98.3 F (36.8 C)-98.6 F (37 C)] 98.3 F (36.8 C) (03/16 0700) Pulse Rate:  [103-129] 121 (03/16 1156) Resp:  [17-42] 32 (03/16 1156) BP: (98-165)/(54-126) 98/54 (03/16 1100) SpO2:  [94 %-100 %] 100 % (03/16 1156) Weight:  [134 lb (60.8 kg)] 134 lb (60.8 kg) (03/15 1630)   General:  Stooped posture, lethargic, NAD Head:  Normocephalic and  atraumatic. Eyes:   No icterus.   Conjunctiva pale. PERRLA. Ears:  Normal auditory acuity. Neck:  Supple; no masses or thyroidomegaly Lungs: Respirations even and unlabored. Lungs clear to auscultation bilaterally.   No wheezes, crackles, or rhonchi.  Heart:  tachycardia;  Without murmur, clicks, rubs or gallops Abdomen:  Soft, nondistended, nontender. Normal bowel sounds. No appreciable masses or hepatomegaly.  No rebound or guarding.  Rectal:  Brown mucus smear, empty rectal vault Msk:  Symmetrical without gross deformities.  Strength generalized weakness  Extremities:  Without edema, cyanosis or clubbing. Neurologic:  Alert and oriented x3;  grossly normal neurologically. Skin:  Intact without significant lesions or rashes. Psych:  Alert and cooperative.flat affect  LAB RESULTS: CBC Latest Ref Rng & Units 07/15/2017 07/15/2017 07/14/2017  WBC 3.6 - 11.0 K/uL - - -  Hemoglobin 12.0 - 16.0 g/dL 8.6(L) 9.7(L) 9.2(L)  Hematocrit 35.0 - 47.0 % 28.3(L) 31.0(L) 30.6(L)  Platelets 150 - 440 K/uL - - -    BMET BMP  Latest Ref Rng & Units 07/15/2017 07/14/2017 07/05/2017  Glucose 65 - 99 mg/dL 103(H) 160(H) 234(H)  BUN 6 - 20 mg/dL 13 19 9   Creatinine 0.44 - 1.00 mg/dL <0.30(L) <0.30(L) <0.30(L)  Sodium 135 - 145 mmol/L 147(H) 144 138  Potassium 3.5 - 5.1 mmol/L 4.1 4.0 4.3  Chloride 101 - 111 mmol/L 92(L) 86(L) 84(L)  CO2 22 - 32 mmol/L 46(H) 50(H) 44(H)  Calcium 8.9 - 10.3 mg/dL 9.6 9.8 9.6    LFT Hepatic Function Latest Ref Rng & Units 07/14/2017 07/04/2017 06/05/2017  Total Protein 6.5 - 8.1 g/dL 7.5 7.7 6.4(L)  Albumin 3.5 - 5.0 g/dL 3.6 3.6 3.0(L)  AST 15 - 41 U/L 17 21 16   ALT 14 - 54 U/L 13(L) 18 9(L)  Alk Phosphatase 38 - 126 U/L 48 58 38  Total Bilirubin 0.3 - 1.2 mg/dL 0.5 0.5 0.3  Bilirubin, Direct 0.1 - 0.5 mg/dL 0.1 - -     STUDIES: Dg Chest Port 1 View  Result Date: 07/15/2017 CLINICAL DATA:  Acute respiratory failure. EXAM: PORTABLE CHEST 1 VIEW COMPARISON:  Chest x-ray  from yesterday FINDINGS: The heart size and mediastinal contours are within normal limits. Normal pulmonary vascularity. Unchanged left greater than right bibasilar atelectasis and scarring at the costophrenic angles. No focal consolidation, pleural effusion, or pneumothorax. No acute osseous abnormality. IMPRESSION: Stable bibasilar atelectasis/scarring. Electronically Signed   By: Titus Dubin M.D.   On: 07/15/2017 10:41   Dg Chest Portable 1 View  Result Date: 07/14/2017 CLINICAL DATA:  Shortness of breath. EXAM: PORTABLE CHEST 1 VIEW COMPARISON:  06/05/2017. FINDINGS: Mediastinum and hilar structures normal. Stable mild cardiomegaly with normal pulmonary vascularity. Low lung volumes with mild basilar atelectasis. Stable bilateral pleural thickening consistent scarring. Degenerative changes thoracic spine. IMPRESSION: Low lung volumes with mild basilar atelectasis and/or pleuroparenchymal scarring again noted. Stable mild cardiomegaly. Chest is stable from prior exam. Electronically Signed   By: Marcello Moores  Register   On: 07/14/2017 17:13      Impression / Plan:   Devonne Kitchen is a 62 y.o. AA female with severe COPD/emphysema, IDA secondary to PUD admitted with worsening anemia, weakness, ?dark stools. No evidence of active GI bleed  - start CLD - Continue PPI drip - CBC daily - check ferritin - IV iron ordered - EGD tomorrow, NPO past midnight - Pt will probably benefit from inpt rehab due to her social situation  Thank you for involving me in the care of this patient.      LOS: 1 day   Sherri Sear, MD  07/15/2017, 2:19 PM   Note: This dictation was prepared with Dragon dictation along with smaller phrase technology. Any transcriptional errors that result from this process are unintentional.

## 2017-07-16 ENCOUNTER — Inpatient Hospital Stay: Payer: Medicaid Other

## 2017-07-16 ENCOUNTER — Encounter
Admission: EM | Disposition: A | Discharge status: Skilled Nursing Facility | Payer: Self-pay | Source: Home / Self Care | Attending: Internal Medicine

## 2017-07-16 LAB — CBC
HCT: 25.9 % — ABNORMAL LOW (ref 35.0–47.0)
Hemoglobin: 8 g/dL — ABNORMAL LOW (ref 12.0–16.0)
MCH: 28.7 pg (ref 26.0–34.0)
MCHC: 30.7 g/dL — ABNORMAL LOW (ref 32.0–36.0)
MCV: 93.6 fL (ref 80.0–100.0)
Platelets: 178 10*3/uL (ref 150–440)
RBC: 2.77 MIL/uL — AB (ref 3.80–5.20)
RDW: 18.1 % — ABNORMAL HIGH (ref 11.5–14.5)
WBC: 9.8 10*3/uL (ref 3.6–11.0)

## 2017-07-16 LAB — GLUCOSE, CAPILLARY: GLUCOSE-CAPILLARY: 159 mg/dL — AB (ref 65–99)

## 2017-07-16 SURGERY — EGD (ESOPHAGOGASTRODUODENOSCOPY)
Anesthesia: General

## 2017-07-16 MED ORDER — IPRATROPIUM-ALBUTEROL 0.5-2.5 (3) MG/3ML IN SOLN
3.0000 mL | Freq: Two times a day (BID) | RESPIRATORY_TRACT | Status: DC
Start: 2017-07-17 — End: 2017-07-18
  Administered 2017-07-17 – 2017-07-18 (×3): 3 mL via RESPIRATORY_TRACT
  Filled 2017-07-16 (×3): qty 3

## 2017-07-16 NOTE — Progress Notes (Signed)
[place] [facility] [place] NAME: Kayla Boyer    MR#:  [id number]  DATE OF BIRTH:  1955-05-22  SUBJECTIVE:   complains of some mild headache.  No shortness of breath.  REVIEW OF SYSTEMS:   Review of Systems  Constitutional: Negative for chills, fever and weight loss.  HENT: Negative for ear discharge, ear pain and nosebleeds.   Eyes: Negative for blurred vision, pain and discharge.  Respiratory: Negative for sputum production, shortness of breath, wheezing and stridor.   Cardiovascular: Negative for chest pain, palpitations, orthopnea and PND.  Gastrointestinal: Positive for melena. Negative for abdominal pain, diarrhea, nausea and vomiting.  Genitourinary: Negative for frequency and urgency.  Musculoskeletal: Negative for back pain and joint pain.  Neurological: Positive for weakness. Negative for sensory change, speech change and focal weakness.  Psychiatric/Behavioral: Negative for depression and hallucinations. The patient is not nervous/anxious.    Tolerating Diet: Clear liquid diet Tolerating PT: pending  DRUG ALLERGIES:  No Known Allergies  VITALS:  Blood pressure (!) 151/69, pulse (!) 117, temperature 98.3 F (36.8 C), resp. rate 18, height 5' 2.5" (1.588 m), weight 59.9 kg (132 lb 0.9 oz), SpO2 100 %.  PHYSICAL EXAMINATION:   Physical Exam  GENERAL:  62 y.o.-year-old patient lying in the bed with no acute distress.  EYES: Pupils equal, round, reactive to light and accommodation. No scleral icterus. Extraocular muscles intact.  HEENT: Head atraumatic, normocephalic. Oropharynx and nasopharynx clear.  NECK:  Supple, no jugular venous distention. No thyroid enlargement, no tenderness.  LUNGS: Normal breath sounds bilaterally, no wheezing, rales, rhonchi. No use of accessory muscles of respiration.  CARDIOVASCULAR: S1, S2 normal. No murmurs, rubs, or gallops.  ABDOMEN: Soft, nontender, nondistended. Bowel sounds present. No  organomegaly or mass.  EXTREMITIES: No cyanosis, clubbing or edema b/l.    NEUROLOGIC: Cranial nerves II through XII are intact. No focal Motor or sensory deficits b/l.   PSYCHIATRIC:  patient is alert and oriented x 3.  SKIN: No obvious rash, lesion, or ulcer.   LABORATORY PANEL:  CBC Recent Labs  Lab 07/16/17 0359  WBC 9.8  HGB 8.0*  HCT 25.9*  PLT 178    Chemistries  Recent Labs  Lab 07/14/17 1652 07/15/17 0315  NA 144 147*  K 4.0 4.1  CL 86* 92*  CO2 50* 46*  GLUCOSE 160* 103*  BUN 19 13  CREATININE <0.30* <0.30*  CALCIUM 9.8 9.6  AST 17  --   ALT 13*  --   ALKPHOS 48  --   BILITOT 0.5  --    Cardiac Enzymes No results for input(s): TROPONINI in the last 168 hours. RADIOLOGY:  Dg Chest Port 1 View  Result Date: 07/15/2017 CLINICAL DATA:  Acute respiratory failure. EXAM: PORTABLE CHEST 1 VIEW COMPARISON:  Chest x-ray from yesterday FINDINGS: The heart size and mediastinal contours are within normal limits. Normal pulmonary vascularity. Unchanged left greater than right bibasilar atelectasis and scarring at the costophrenic angles. No focal consolidation, pleural effusion, or pneumothorax. No acute osseous abnormality. IMPRESSION: Stable bibasilar atelectasis/scarring. Electronically Signed   By: Titus Dubin M.D.   On: 07/15/2017 10:41   Dg Chest Portable 1 View  Result Date: 07/14/2017 CLINICAL DATA:  Shortness of breath. EXAM: PORTABLE CHEST 1 VIEW COMPARISON:  06/05/2017. FINDINGS: Mediastinum and hilar structures normal. Stable mild cardiomegaly with normal pulmonary vascularity. Low lung volumes with mild basilar atelectasis. Stable bilateral pleural thickening consistent scarring. Degenerative changes thoracic spine. IMPRESSION: Low  lung volumes with mild basilar atelectasis and/or pleuroparenchymal scarring again noted. Stable mild cardiomegaly. Chest is stable from prior exam. Electronically Signed   By: Marcello Moores  Register   On: 07/14/2017 17:13   ASSESSMENT  AND PLAN:  Kayla Boyer  is a 62 y.o. female with a known history of gastrointestinal bleeding, gastric ulcer, hyperlipidemia, emphysema of lung was referred by ER physician Dr.malinda for weakness and dark stools.  Patient presented to the emergency room with generalized weakness.  She has noticed a dark black stools for the last few days   1 acute on chornic gastrointestinal bleeding. -Patient recently had a EGD that showed gastric ulcers.  She was lost to follow-up with primary care and gastroenterology. -PRBC transfusion as needed\ -gastroenterology consultation appreciated -Serial hemoglobin hematocrit monitoring -IV Protonix drip  -EGD planned for tomorrow  2.Symptomatic anemia with weakness PRBC transfusion as needed  3.    Acute on chronic hypoxic respiratory failure secondary to COPD exacerbation. -Patient currently is at baseline on nasal cannula oxygen.  No wheezing.  Received a dose of steroid.  Given ulcers I will hold off on more steroids.  Do scheduled nebulizer and inhalers.  4.  DVT prophylaxis Sequential compression device to lower extremities  Case discussed with Care Management/Social Worker. Management plans discussed with the patient, family and they are in agreement.  CODE STATUS: Full  DVT Prophylaxis: scd  TOTAL TIME TAKING CARE OF THIS PATIENT: 30 minutes.  >50% time spent on counselling and coordination of care  POSSIBLE D/C IN *few* DAYS, DEPENDING ON CLINICAL CONDITION.  Note: This dictation was prepared with Dragon dictation along with smaller phrase technology. Any transcriptional errors that result from this process are unintentional.  Fritzi Mandes M.D on 07/16/2017 at 11:22 AM  Between 7am to 6pm - Pager - [phone]  After 6pm go to [email or web address] - password [email or web address]  [facility]  Office  [phone]  CC: Primary care physician; Patient, No Pcp PerPatient ID: Kayla Boyer, female   DOB: Jun 16, 1955, 62 y.o.   MRN:  [id number]

## 2017-07-16 NOTE — Clinical Social Work Note (Signed)
The CSW received a consult that the patient has transportation needs. The CSW also viewed a consult for CM that indicated these needs are for running errands and procuring medications. The CSW will provide a list of local transportation options when able.  Santiago Bumpers, MSW, Latanya Presser [phone]

## 2017-07-16 NOTE — Progress Notes (Signed)
Per MD okay for RN to DC telemetry order. 

## 2017-07-16 NOTE — Progress Notes (Signed)
[facility] Progess Note    SYNOPSIS   71 F hx COPD with recent hosp for GIB due to ulcers presenting with continued drop in Hgb 11 to 9.0.  Pt admitted to stepdown unit with acute on chronic hypercapnic hypoxic respiratory failure and acute encephalopathy secondary to AECOPD requiring Bipap   ASSESSMENT/PLAN   Respiratory failure. Off of BiPAP, on [place] doing well at this time  GI bleeding.  Slight decrease in H/H no evidence of active bleeding.   Upper extremity swelling will obtain Venous Dopplers.  Stable for floor transfer  VENTILATOR SETTINGS: FiO2 (%):  [30 %] 30 %  INTAKE / OUTPUT:  Intake/Output Summary (Last 24 hours) at 07/16/2017 0847 Last data filed at 07/16/2017 0800 Gross per 24 hour  Intake 3857 ml  Output 450 ml  Net 3407 ml    Name: Kayla Boyer MRN: [id number] DOB: 1955/12/28    ADMISSION DATE:  07/14/2017  SUBJECTIVE: Patient breathing better, no clear evidence of active bleeding  VITAL SIGNS: Temp:  [97.7 F (36.5 C)-98.4 F (36.9 C)] 98.3 F (36.8 C) (03/17 0800) Pulse Rate:  [92-134] 109 (03/17 0800) Resp:  [15-33] 30 (03/17 0800) BP: (98-162)/(53-99) 126/63 (03/17 0800) SpO2:  [97 %-100 %] 100 % (03/17 0800) FiO2 (%):  [30 %] 30 % (03/17 0700)  PHYSICAL EXAMINATION: Physical Examination:   VS: BP 126/63 (BP Location: Left Leg)   Pulse (!) 109   Temp 98.3 F (36.8 C) (Oral)   Resp (!) 30   Ht 5' 2.5" (1.588 m)   Wt 60.8 kg (134 lb)   SpO2 100%   BMI 24.12 kg/m   General Appearance: No distress  HEENT: PERRLA, EOM intact. Pulmonary: Dimished breath sounds Cardiovascular Normal S1,S2.  No m/r/g.   Abdomen: Benign, Soft, non-tender.  Extremities: upper extremity swelling B/L  LABORATORY PANEL:   CBC Recent Labs  Lab 07/16/17 0359  WBC 9.8  HGB 8.0*  HCT 25.9*  PLT 178    Chemistries  Recent Labs  Lab 07/14/17 1652 07/15/17 0315  NA 144 147*  K 4.0 4.1  CL 86* 92*  CO2 50* 46*  GLUCOSE 160*  103*  BUN 19 13  CREATININE <0.30* <0.30*  CALCIUM 9.8 9.6  AST 17  --   ALT 13*  --   ALKPHOS 48  --   BILITOT 0.5  --     No results for input(s): GLUCAP in the last 168 hours. Recent Labs  Lab 07/14/17 1942  PHART 7.25*  PCO2ART 120*  PO2ART 70*   Recent Labs  Lab 07/14/17 1652  AST 17  ALT 13*  ALKPHOS 48  BILITOT 0.5  ALBUMIN 3.6    Cardiac Enzymes No results for input(s): TROPONINI in the last 168 hours.  RADIOLOGY:  Dg Chest Port 1 View  Result Date: 07/15/2017 CLINICAL DATA:  Acute respiratory failure. EXAM: PORTABLE CHEST 1 VIEW COMPARISON:  Chest x-ray from yesterday FINDINGS: The heart size and mediastinal contours are within normal limits. Normal pulmonary vascularity. Unchanged left greater than right bibasilar atelectasis and scarring at the costophrenic angles. No focal consolidation, pleural effusion, or pneumothorax. No acute osseous abnormality. IMPRESSION: Stable bibasilar atelectasis/scarring. Electronically Signed   By: Titus Dubin M.D.   On: 07/15/2017 10:41   Dg Chest Portable 1 View  Result Date: 07/14/2017 CLINICAL DATA:  Shortness of breath. EXAM: PORTABLE CHEST 1 VIEW COMPARISON:  06/05/2017. FINDINGS: Mediastinum and hilar structures normal. Stable mild cardiomegaly with normal pulmonary vascularity. Low lung  volumes with mild basilar atelectasis. Stable bilateral pleural thickening consistent scarring. Degenerative changes thoracic spine. IMPRESSION: Low lung volumes with mild basilar atelectasis and/or pleuroparenchymal scarring again noted. Stable mild cardiomegaly. Chest is stable from prior exam. Electronically Signed   By: [place]   On: 07/14/2017 17:13    Hermelinda Dellen, DO  07/16/2017

## 2017-07-16 NOTE — Progress Notes (Signed)
[facility] responded to rapid response code. No family present at this time. Staff filled the room quickly. [place] available upon requests to return to patient's room. CH provided silent prayer.

## 2017-07-16 NOTE — Progress Notes (Signed)
Spoke to Dr. Marius Ditch this morning regarding plans for EGD.  Since pt is not actively bleeding at this time, Dr. Marius Ditch stated she will proceed with EGD tomorrow (07/17/17). Verbal order received to resume clear liquid diet and place patient NPO after midnight tonight.  Dr. Marius Ditch also recommended decreasing IV fluids from 100 ml/hr to 50ml/hr and if patient is stable, may transfer to floor.  Will discuss with Dr. Jefferson Fuel.

## 2017-07-17 ENCOUNTER — Ambulatory Visit (HOSPITAL_COMMUNITY): Admit: 2017-07-17 | Payer: Self-pay | Admitting: Gastroenterology

## 2017-07-17 ENCOUNTER — Encounter: Payer: Self-pay | Admitting: *Deleted

## 2017-07-17 ENCOUNTER — Encounter
Admission: EM | Disposition: A | Discharge status: Skilled Nursing Facility | Payer: Self-pay | Source: Home / Self Care | Attending: Internal Medicine

## 2017-07-17 ENCOUNTER — Inpatient Hospital Stay: Payer: Medicaid Other | Admitting: Anesthesiology

## 2017-07-17 DIAGNOSIS — K257 Chronic gastric ulcer without hemorrhage or perforation: Secondary | ICD-10-CM

## 2017-07-17 HISTORY — PX: ESOPHAGOGASTRODUODENOSCOPY (EGD) WITH PROPOFOL: SHX5813

## 2017-07-17 LAB — CBC
HEMATOCRIT: 26.1 % — AB (ref 35.0–47.0)
HEMOGLOBIN: 7.9 g/dL — AB (ref 12.0–16.0)
MCH: 27.7 pg (ref 26.0–34.0)
MCHC: 30.1 g/dL — ABNORMAL LOW (ref 32.0–36.0)
MCV: 92 fL (ref 80.0–100.0)
Platelets: 181 10*3/uL (ref 150–440)
RBC: 2.84 MIL/uL — AB (ref 3.80–5.20)
RDW: 17.3 % — ABNORMAL HIGH (ref 11.5–14.5)
WBC: 31 10*3/uL — ABNORMAL HIGH (ref 3.6–11.0)

## 2017-07-17 LAB — GLUCOSE, CAPILLARY
GLUCOSE-CAPILLARY: 130 mg/dL — AB (ref 65–99)
Glucose-Capillary: 108 mg/dL — ABNORMAL HIGH (ref 65–99)

## 2017-07-17 LAB — BASIC METABOLIC PANEL
ANION GAP: 11 (ref 5–15)
BUN: 6 mg/dL (ref 6–20)
CO2: 38 mmol/L — AB (ref 22–32)
Calcium: 8.9 mg/dL (ref 8.9–10.3)
Chloride: 92 mmol/L — ABNORMAL LOW (ref 101–111)
Creatinine, Ser: <0.3 mg/dL — ABNORMAL LOW (ref 0.44–1.00)
Glucose, Bld: 74 mg/dL (ref 65–99)
POTASSIUM: 3 mmol/L — AB (ref 3.5–5.1)
Sodium: 141 mmol/L (ref 135–145)

## 2017-07-17 SURGERY — ESOPHAGOGASTRODUODENOSCOPY (EGD) WITH PROPOFOL
Anesthesia: General

## 2017-07-17 MED ORDER — PANTOPRAZOLE SODIUM 40 MG IV SOLR
40.0000 mg | Freq: Two times a day (BID) | INTRAVENOUS | Status: DC
  Administered 2017-07-17 – 2017-07-20 (×6): 40 mg via INTRAVENOUS
  Filled 2017-07-17 (×6): qty 40

## 2017-07-17 MED ORDER — PROPOFOL 10 MG/ML IV BOLUS
INTRAVENOUS | Status: DC | PRN
  Administered 2017-07-17: 50 mg via INTRAVENOUS

## 2017-07-17 MED ORDER — PROPOFOL 500 MG/50ML IV EMUL
INTRAVENOUS | Status: AC
  Filled 2017-07-17: qty 50

## 2017-07-17 MED ORDER — LIDOCAINE HCL (CARDIAC) 20 MG/ML IV SOLN
INTRAVENOUS | Status: DC | PRN
  Administered 2017-07-17: 100 mg via INTRATRACHEAL

## 2017-07-17 MED ORDER — LIDOCAINE HCL (PF) 1 % IJ SOLN
INTRAMUSCULAR | Status: AC
  Filled 2017-07-17: qty 2

## 2017-07-17 MED ORDER — PHENYLEPHRINE HCL 10 MG/ML IJ SOLN
INTRAMUSCULAR | Status: DC | PRN
  Administered 2017-07-17: 200 ug via INTRAVENOUS

## 2017-07-17 MED ORDER — PROPOFOL 500 MG/50ML IV EMUL
INTRAVENOUS | Status: DC | PRN
  Administered 2017-07-17: 100 ug/kg/min via INTRAVENOUS

## 2017-07-17 MED ORDER — POTASSIUM CHLORIDE 20 MEQ PO PACK: 40.0000 meq | PACK | Freq: Once | ORAL | Status: DC

## 2017-07-17 NOTE — Significant Event (Signed)
Rapid Response Event Note  Overview: Time Called: 2224 Arrival Time: 2228 Event Type: Unknown  Initial Focused Assessment: Upon entry, pt. On 2 L [place], appeared lethargic- able to make eye contact but not verbally responding.  HR elevated in the 130's, RR elevated in the high 30's. RT called bedside to place pt. On Bipap. O2 dropped to 74% while being transferred between O2 devices. Once on Bipap HR, RR, SpO2 and BP all stabilized WNL within 5 minutes of being on Bipap. Nursing Supervisor, Earley Brooke. Present bedside.  Interventions: Placed on Bipap for O/N as per orders.    Plan of Care (if not transferred): Discussed with care RN to call back if patient's vitals deteriorate/ pt. Is working harder to breath/ LOC deteriorates.  Will page Hospitalist and inform them of event. Event Summary: Name of Physician Notified: paged Hospitalist x 2 at (2250 and 2330)    Outcome: Stayed in room and stabalized  Event End Time: [place]

## 2017-07-17 NOTE — Anesthesia Post-op Follow-up Note (Signed)
Anesthesia QCDR form completed.        

## 2017-07-17 NOTE — Care Management (Signed)
Patient admitted from home with GI bleed.    Patient lives at home with husband. Pharmacy [facility].  Bedbound for the last year.   Previously patient had been open with [facility] through [facility]. Last Admission this RNCM Confirmed that patient has Bed, O2, and BSC delivered.  Patient already had RW in the home.  Last Admission and February patient was set up with a PCP appointment at [facility] for 07/10/17.  RNCM called [facility] and was informed that patient cancelled appointment and did not reschedule.  That admission scripts were sent to [facility]  And husband was to pick up.  RNCM called [facility]  And scripts sat filled for  1 month and 4 days without being picked up.  They were put back in supply at that time.  Patient also declined for charity home health to be pursued the previous admission. Patient is off the floor right now. RNCM to follow up when she returns.

## 2017-07-17 NOTE — OR Nursing (Signed)
REPORT TO PHYLLIS ,RN RE: PREVIOUS IV SITE SWOLLEN AND PURULENT EXUDATE. I/S RN TO CALL [facility] FOR ULTRSOUND. UNDERSTANDING VOICED. IV SITE NOW LEFT FOOT. UNREMARKABLE. TO BE TRANSPORT BACK TO FLOOR. PT WITH NON BLEEDING GASTRIC ULCER. NO BX

## 2017-07-17 NOTE — Progress Notes (Signed)
2 RN's verified patient identity

## 2017-07-17 NOTE — Progress Notes (Signed)
Talked to pharmacy about the protonix drip the pt is already on now that the every 12 hours a 40 mg injection is ordered so she said that we could D/C the drip and start at 2200 for the injection.

## 2017-07-17 NOTE — Progress Notes (Signed)
eLink Physician-Brief Progress Note Patient Name: Kayla Boyer DOB: Mar 11, 1956 MRN: [id number]   Date of Service  07/17/2017  HPI/Events of Note  62 y.o. female with a known history of gastrointestinal bleeding, gastric ulcer, hyperlipidemia, emphysema of lung was referred by ER physician Dr.malinda for weakness and dark stools.  Patient presented to the emergency room with generalized weakness.  She has noticed a dark black stools for the last few days she was recently in our hospital last month and had an endoscopy which showed gastric ulcers she was discharged on oral proton pump inhibitors. Now s/p EGD with findings of:       1. Normal duodenal bulb and second portion of the duodenum.       2.  Duodenal lipoma.       3. Non-bleeding gastric ulcer with a clean ulcer base  (Forrest Class III).       4. Normal gastroesophageal junction and esophagus. BP = 114/57 and Hgb = 7.9. Sat = 100% and rR = 25. PCCM has been consulted to assume care in the ICU.  eICU Interventions  No new orders.      Intervention Category Evaluation Type: New Patient Evaluation  Lysle Dingwall 07/17/2017, 7:56 PM

## 2017-07-17 NOTE — Op Note (Signed)
[facility] Gastroenterology Patient Name: Kayla Boyer Procedure Date: 07/17/2017 12:47 PM MRN: [id number] Account #: [id number] Date of Birth: 12-07-1955 Admit Type: Inpatient Age: 62 Room: [facility] ENDO ROOM 3 Gender: Female Note Status: Finalized Procedure:            Upper GI endoscopy Indications:          Iron deficiency anemia secondary to chronic blood loss Providers:            Lin Landsman MD, MD Referring MD:         No Local Md, MD (Referring MD) Medicines:            Monitored Anesthesia Care Complications:        No immediate complications. Estimated blood loss: None. Procedure:            Pre-Anesthesia Assessment:                       - Prior to the procedure, a History and Physical was                        performed, and patient medications and allergies were                        reviewed. The patient is competent. The risks and                        benefits of the procedure and the sedation options and                        risks were discussed with the patient. All questions                        were answered and informed consent was obtained.                        Patient identification and proposed procedure were                        verified by the physician, the nurse, the                        anesthesiologist, the anesthetist and the technician in                        the pre-procedure area in the procedure room in the                        endoscopy suite. Mental Status Examination: alert and                        oriented. Airway Examination: normal oropharyngeal                        airway and neck mobility. Respiratory Examination:                        clear to auscultation. CV Examination: normal.                        Prophylactic Antibiotics: The patient does not  require                        prophylactic antibiotics. Prior Anticoagulants: The                        patient has taken no previous  anticoagulant or                        antiplatelet agents. ASA Grade Assessment: III - A                        patient with severe systemic disease. After reviewing                        the risks and benefits, the patient was deemed in                        satisfactory condition to undergo the procedure. The                        anesthesia plan was to use monitored anesthesia care                        (MAC). Immediately prior to administration of                        medications, the patient was re-assessed for adequacy                        to receive sedatives. The heart rate, respiratory rate,                        oxygen saturations, blood pressure, adequacy of                        pulmonary ventilation, and response to care were                        monitored throughout the procedure. The physical status                        of the patient was re-assessed after the procedure.                       After obtaining informed consent, the endoscope was                        passed under direct vision. Throughout the procedure,                        the patient's blood pressure, pulse, and oxygen                        saturations were monitored continuously. The Endoscope                        was introduced through the mouth, and advanced to the                        second part of  duodenum. The upper GI endoscopy was                        accomplished without difficulty. The patient tolerated                        the procedure well. Findings:      The duodenal bulb and second portion of the duodenum were normal.      There was a small lipoma in the area of the papilla.      One non-bleeding cratered gastric ulcer with a clean ulcer base (Forrest       Class III) was found on the lesser curvature of the gastric antrum. The       lesion was 5 mm in largest dimension.      The cardia and gastric fundus were normal on retroflexion.      The gastroesophageal  junction and examined esophagus were normal. Impression:           - Normal duodenal bulb and second portion of the                        duodenum.                       - Duodenal lipoma.                       - Non-bleeding gastric ulcer with a clean ulcer base                        (Forrest Class III).                       - Normal gastroesophageal junction and esophagus.                       - No specimens collected. Recommendation:       - Return patient to hospital ward for ongoing care.                       - Advance diet as tolerated.                       - Continue present medications. Procedure Code(s):    --- Professional ---                       [phone], Esophagogastroduodenoscopy, flexible, transoral;                        diagnostic, including collection of specimen(s) by                        brushing or washing, when performed (separate procedure) Diagnosis Code(s):    --- Professional ---                       D17.5, Benign lipomatous neoplasm of intra-abdominal                        organs                       K25.9, Gastric ulcer, unspecified as acute  or chronic,                        without hemorrhage or perforation                       D50.0, Iron deficiency anemia secondary to blood loss                        (chronic) CPT copyright 2016 American Medical Association. All rights reserved. The codes documented in this report are preliminary and upon coder review may  be revised to meet current compliance requirements. Dr. Ulyess Mort Lin Landsman MD, MD 07/17/2017 1:08:06 PM This report has been signed electronically. Number of Addenda: 0 Note Initiated On: 07/17/2017 12:47 PM      [facility]

## 2017-07-17 NOTE — Clinical Social Work Note (Signed)
CSW consulted for transportation needs. RN CM has addressed this with the patient's family.  Shela Leff MSW,LCSW [phone]

## 2017-07-17 NOTE — Progress Notes (Signed)
[facility] [place] NAME: Kayla Boyer    MR#:  [id number]  DATE OF BIRTH:  [birth year]  SUBJECTIVE:  Seen her earlier and was getting ready to leave for EGD, no complaints REVIEW OF SYSTEMS:   Review of Systems  Constitutional: Negative for chills, fever and weight loss.  HENT: Negative for ear discharge, ear pain and nosebleeds.   Eyes: Negative for blurred vision, pain and discharge.  Respiratory: Negative for sputum production, shortness of breath, wheezing and stridor.   Cardiovascular: Negative for chest pain, palpitations, orthopnea and PND.  Gastrointestinal: Positive for melena. Negative for abdominal pain, diarrhea, nausea and vomiting.  Genitourinary: Negative for frequency and urgency.  Musculoskeletal: Negative for back pain and joint pain.  Neurological: Positive for weakness. Negative for sensory change, speech change and focal weakness.  Psychiatric/Behavioral: Negative for depression and hallucinations. The patient is not nervous/anxious.    Tolerating Diet: Clear liquid diet Tolerating PT: pending  DRUG ALLERGIES:  No Known Allergies  VITALS:  Blood pressure (!) 114/57, pulse (!) 124, temperature (!) 96.9 F (36.1 C), resp. rate 18, height 5' 2.5" (1.588 m), weight 59.9 kg (132 lb 0.9 oz), SpO2 100 %.  PHYSICAL EXAMINATION:   Physical Exam  GENERAL:  62 y.o.-year-old patient lying in the bed with no acute distress.  EYES: Pupils equal, round, reactive to light and accommodation. No scleral icterus. Extraocular muscles intact.  HEENT: Head atraumatic, normocephalic. Oropharynx and nasopharynx clear.  NECK:  Supple, no jugular venous distention. No thyroid enlargement, no tenderness.  LUNGS: Normal breath sounds bilaterally, no wheezing, rales, rhonchi. No use of accessory muscles of respiration.  CARDIOVASCULAR: S1, S2 normal. No murmurs, rubs, or gallops.  ABDOMEN: Soft, nontender, nondistended. Bowel sounds  present. No organomegaly or mass.  EXTREMITIES: No cyanosis, clubbing or edema b/l.    NEUROLOGIC: Cranial nerves II through XII are intact. No focal Motor or sensory deficits b/l.   PSYCHIATRIC:  patient is alert and oriented x 3.  SKIN: No obvious rash, lesion, or ulcer.   LABORATORY PANEL:  CBC Recent Labs  Lab 07/17/17 0956  WBC 31.0*  HGB 7.9*  HCT 26.1*  PLT 181    Chemistries  Recent Labs  Lab 07/14/17 1652  07/17/17 0738  NA 144   < > 141  K 4.0   < > 3.0*  CL 86*   < > 92*  CO2 50*   < > 38*  GLUCOSE 160*   < > 74  BUN 19   < > 6  CREATININE <0.30*   < > <0.30*  CALCIUM 9.8   < > 8.9  AST 17  --   --   ALT 13*  --   --   ALKPHOS 48  --   --   BILITOT 0.5  --   --    < > = values in this interval not displayed.   Cardiac Enzymes No results for input(s): TROPONINI in the last 168 hours. RADIOLOGY:  US Venous Img Upper Bilat  Result Date: 07/16/2017 CLINICAL DATA:  Bilateral upper extremity edema acutely with pain and discomfort for 1 day EXAM: BILATERAL UPPER EXTREMITY VENOUS DOPPLER ULTRASOUND TECHNIQUE: Gray-scale sonography with graded compression, as well as color Doppler and duplex ultrasound were performed to evaluate the bilateral upper extremity deep venous systems from the level of the subclavian vein and including the jugular, axillary, basilic, radial, ulnar and upper cephalic vein. Spectral Doppler was utilized to evaluate  flow at rest and with distal augmentation maneuvers. COMPARISON:  None. FINDINGS: Exam is limited because of upper extremity subcutaneous edema. RIGHT UPPER EXTREMITY Internal Jugular Vein: No evidence of thrombus. Normal compressibility, respiratory phasicity and response to augmentation. Subclavian Vein: No evidence of thrombus. Normal compressibility, respiratory phasicity and response to augmentation. Axillary Vein: No evidence of thrombus. Normal compressibility, respiratory phasicity and response to augmentation. Cephalic Vein: No  evidence of thrombus. Normal compressibility, respiratory phasicity and response to augmentation. Basilic Vein: Not well visualized to accurately assess. Brachial Veins: No evidence of thrombus. Normal compressibility, respiratory phasicity and response to augmentation. Radial Veins: No evidence of thrombus. Normal compressibility, respiratory phasicity and response to augmentation. Ulnar Veins: No evidence of thrombus. Normal compressibility, respiratory phasicity and response to augmentation. Venous Reflux:  None. Other Findings:  Peripheral edema noted LEFT UPPER EXTREMITY Internal Jugular Vein: No evidence of thrombus. Normal compressibility, respiratory phasicity and response to augmentation. Subclavian Vein: No evidence of thrombus. Normal compressibility, respiratory phasicity and response to augmentation. Axillary Vein: No evidence of thrombus. Normal compressibility, respiratory phasicity and response to augmentation. Cephalic Vein: No evidence of thrombus. Normal compressibility, respiratory phasicity and response to augmentation. Basilic Vein: No evidence of thrombus. Normal compressibility, respiratory phasicity and response to augmentation. Brachial Veins: No evidence of thrombus. Normal compressibility, respiratory phasicity and response to augmentation. Radial Veins: No evidence of thrombus. Normal compressibility, respiratory phasicity and response to augmentation. Ulnar Veins: No evidence of thrombus. Normal compressibility, respiratory phasicity and response to augmentation. Venous Reflux:  None. Other Findings:  But peripheral subcutaneous edema noted. IMPRESSION: Limited exam but no gross occlusive upper extremity DVT in either arm. Electronically Signed   By: Jerilynn Mages.  Shick M.D.   On: 07/16/2017 12:34   ASSESSMENT AND PLAN:  Kayla Boyer  is a 62 y.o. female with a known history of gastrointestinal bleeding, gastric ulcer, hyperlipidemia, emphysema of lung was referred by ER physician Dr.malinda  for weakness and dark stools.  Patient presented to the emergency room with generalized weakness.  She has noticed a dark black stools for the last few days   1 acute on chornic gastrointestinal bleeding. -Patient recently had a EGD that showed gastric ulcers.  She was lost to follow-up with primary care and gastroenterology. -PRBC transfusion as needed -gastroenterology consultation appreciated -Serial hemoglobin hematocrit monitoring -IV Protonix drip  -EGD planned for today - EGD showing Normal duodenal bulb and second portion of the duodenum. Duodenal lipoma. Non-bleeding gastric ulcer with a clean ulcer base (Forrest Class III).Normal gastroesophageal junction and esophagus.  2.Symptomatic anemia with weakness PRBC transfusion as needed  3.    Acute on chronic hypoxic respiratory failure secondary to COPD exacerbation. -Patient currently is at baseline on nasal cannula oxygen.  No wheezing.  Received a dose of steroid.  Given ulcers I will hold off on more steroids.  Do scheduled nebulizer and inhalers.  4.  DVT prophylaxis Sequential compression device to lower extremities   Noticed Rapid response called on her as she developed acute encephalopathy secondary to acute on chronic hypercapnic respiratory failure likely secondary to sedating medications received earlier during EGD requiring Bipap.     Case discussed with Care Management/Social Worker. Management plans discussed with the patient, nursing and they are in agreement.  CODE STATUS: Full  DVT Prophylaxis: scd  TOTAL TIME TAKING CARE OF THIS PATIENT: 15 minutes.  >50% time spent on counselling and coordination of care  POSSIBLE D/C IN *few* DAYS, DEPENDING ON CLINICAL CONDITION.  Note: This dictation  was prepared with Dragon dictation along with smaller phrase technology. Any transcriptional errors that result from this process are unintentional.  Max Sane M.D on 07/17/2017 at 10:32 PM  Between 7am to 6pm -  Pager - [phone]  After 6pm go to [email or web address] - password [email or web address]  [facility]  Office  [phone]  CC: Primary care physician; Patient, No Pcp PerPatient ID: [name], female   DOB: [birth year], 62 y.o.   MRN: [id number]

## 2017-07-17 NOTE — Transfer of Care (Signed)
Immediate Anesthesia Transfer of Care Note  Patient: Kayla Boyer  Procedure(s) Performed: ESOPHAGOGASTRODUODENOSCOPY (EGD) WITH PROPOFOL (N/A )  Patient Location: PACU  Anesthesia Type:General  Level of Consciousness: sedated  Airway & Oxygen Therapy: Patient Spontanous Breathing and Patient connected to nasal cannula oxygen  Post-op Assessment: Report given to RN and Post -op Vital signs reviewed and stable  Post vital signs: Reviewed and stable  Last Vitals:  Vitals:   07/17/17 1224 07/17/17 1310  BP: 135/67   Pulse: 77   Resp: 18   Temp: (!) 36.2 C (!) 36.2 C  SpO2: 100%     Last Pain:  Vitals:   07/17/17 1310  TempSrc: Tympanic         Complications: No apparent anesthesia complications

## 2017-07-17 NOTE — Progress Notes (Signed)
Pt is back from EGD sleepy but is doing well. Family in the room.

## 2017-07-17 NOTE — Progress Notes (Signed)
Called by the nurse that patient is unresponsive.  Patient has normal blood pressure and, normal pulse ABG is ordered and it showed respiratory acidosis with pH 7.16, PCO2 120.  Metabolic encephalopathy secondary to hypercapnic respiratory failure, patient needs BiPAP so patient is transferred to intensive care unit.  Spoke to ICU nurse Hinton Dyer.

## 2017-07-17 NOTE — Progress Notes (Signed)
   07/17/17 1920  Clinical Encounter Type  Visited With Patient not available  Visit Type Initial  Referral From Nurse  Consult/Referral To Chaplain  Spiritual Encounters  Spiritual Needs Prayer;Other (Comment)   CH received an RR for PT. [place] reported to PT's RM and was told that no family was present. [place] maintained a ministry of presences and followed the PT and RR team to the CCU where PT was transferred. Will follow up as needed.

## 2017-07-17 NOTE — Anesthesia Preprocedure Evaluation (Addendum)
Anesthesia Evaluation  Patient identified by MRN, date of birth, ID band Patient awake    Reviewed: Allergy & Precautions, NPO status , Patient's Chart, lab work & pertinent test results  History of Anesthesia Complications Negative for: history of anesthetic complications  Airway Mallampati: III  TM Distance: >3 FB Neck ROM: Full    Dental  (+) Poor Dentition, Missing   Pulmonary neg sleep apnea, COPD,  oxygen dependent,    breath sounds clear to auscultation- rhonchi (-) wheezing      Cardiovascular (-) hypertension(-) CAD, (-) Past MI, (-) Cardiac Stents and (-) CABG  Rhythm:Regular Rate:Normal - Systolic murmurs and - Diastolic murmurs    Neuro/Psych negative neurological ROS  negative psych ROS   GI/Hepatic Neg liver ROS, GIB   Endo/Other  negative endocrine ROSneg diabetes  Renal/GU negative Renal ROS     Musculoskeletal negative musculoskeletal ROS (+)   Abdominal (+) - obese,   Peds  Hematology  (+) anemia ,   Anesthesia Other Findings Past Medical History: No date: Emphysema of lung (HCC) No date: GI bleed No date: HLD (hyperlipidemia)   Reproductive/Obstetrics                             Anesthesia Physical Anesthesia Plan  ASA: III  Anesthesia Plan: General   Post-op Pain Management:    Induction: Intravenous  PONV Risk Score and Plan: 2 and Propofol infusion  Airway Management Planned: Natural Airway  Additional Equipment:   Intra-op Plan:   Post-operative Plan:   Informed Consent: I have reviewed the patients History and Physical, chart, labs and discussed the procedure including the risks, benefits and alternatives for the proposed anesthesia with the patient or authorized representative who has indicated his/her understanding and acceptance.   Dental advisory given and Consent reviewed with POA (consent from son over phone)  Plan Discussed with: CRNA and  Anesthesiologist  Anesthesia Plan Comments:        Anesthesia Quick Evaluation

## 2017-07-17 NOTE — Progress Notes (Signed)
Called a rapid response due to lethargy and unresponsiveness.  Patient is nonverbal at baseline.  Vitals were 171/82, HR 112, blood sugar 159, RR 24 with labored breathing- use of accessory muscles.  Patient normally uses Bipap at night.  Once patient was placed on Bipap, vitals improved-  113/68, and patient became more responsive.  [name]  07/17/2017  12:18 AM

## 2017-07-17 NOTE — Progress Notes (Signed)
Name: Kayla Boyer MRN: [id number] DOB: [birth year]    ADMISSION DATE:  07/14/2017  BRIEF PATIENT DESCRIPTION:  34 F hx COPD with recent hosp for GIB due to ulcers presenting with continued drop in Hgb 11 to 9.0.  Pt admitted to stepdown unit 03/15 with acute on chronic hypercapnic hypoxic respiratory failure and acute encephalopathy secondary to AECOPD requiring Bipap.     SIGNIFICANT EVENTS/STUDIES  03/15-Pt admitted to stepdown unit on with acute blood loss anemia, acute encephalopathy, and acute on chronic hypoxic hypercapnic respiratory failure on Bipap  03/17-Pt transferred to [facility] unit 03/18-Pt underwent EGD results revealed normal duodenal bulb and second portion of the  duodenum, duodenal lipoma, non-bleeding gastric ulcer with a clean ulcer base, and normal gastroesophageal junction and esophagus. Rapid response called pt developed acute encephalopathy secondary to acute on chronic hypercapnic respiratory failure likely secondary to sedating medications received during EGD requiring Bipap.   REVIEW OF SYSTEMS:  Unable to assess pt lethargic on Bipap   SUBJECTIVE:  Unable to assess pt lethargic on Bipap     VITAL SIGNS: Temp:  [96.9 F (36.1 C)-98.3 F (36.8 C)] 96.9 F (36.1 C) (03/18 1930) Pulse Rate:  [77-133] 124 (03/18 1923) Resp:  [18-35] 18 (03/18 1224) BP: (112-171)/(57-82) 114/57 (03/18 1923) SpO2:  [74 %-100 %] 100 % (03/18 1923)  PHYSICAL EXAMINATION: General: acutely ill appearing female, NAD on Bipap  Neuro: lethargic, opens eyes to verbal stimulation and attempting to follow commands, PERRL  HEENT: supple, no JVD  Cardiovascular: sinus tach, no R/G Lungs: diminished throughout, even, non labored  Abdomen: +BS x4, soft, obese, non tender, non distended  Musculoskeletal: bilateral foot drop, no edema  Skin: intact no rashes or lesions   Recent Labs  Lab 07/14/17 1652 07/15/17 0315 07/17/17 0738  NA 144 147* 141  K 4.0 4.1 3.0*  CL 86* 92* 92*    CO2 50* 46* 38*  BUN 19 13 6   CREATININE <0.30* <0.30* <0.30*  GLUCOSE 160* 103* 74   Recent Labs  Lab 07/15/17 2229 07/16/17 0359 07/17/17 0956  HGB 8.7* 8.0* 7.9*  HCT 28.6* 25.9* 26.1*  WBC 10.0 9.8 31.0*  PLT 179 178 181   US Venous Img Upper Bilat  Result Date: 07/16/2017 CLINICAL DATA:  Bilateral upper extremity edema acutely with pain and discomfort for 1 day EXAM: BILATERAL UPPER EXTREMITY VENOUS DOPPLER ULTRASOUND TECHNIQUE: Gray-scale sonography with graded compression, as well as color Doppler and duplex ultrasound were performed to evaluate the bilateral upper extremity deep venous systems from the level of the subclavian vein and including the jugular, axillary, basilic, radial, ulnar and upper cephalic vein. Spectral Doppler was utilized to evaluate flow at rest and with distal augmentation maneuvers. COMPARISON:  None. FINDINGS: Exam is limited because of upper extremity subcutaneous edema. RIGHT UPPER EXTREMITY Internal Jugular Vein: No evidence of thrombus. Normal compressibility, respiratory phasicity and response to augmentation. Subclavian Vein: No evidence of thrombus. Normal compressibility, respiratory phasicity and response to augmentation. Axillary Vein: No evidence of thrombus. Normal compressibility, respiratory phasicity and response to augmentation. Cephalic Vein: No evidence of thrombus. Normal compressibility, respiratory phasicity and response to augmentation. Basilic Vein: Not well visualized to accurately assess. Brachial Veins: No evidence of thrombus. Normal compressibility, respiratory phasicity and response to augmentation. Radial Veins: No evidence of thrombus. Normal compressibility, respiratory phasicity and response to augmentation. Ulnar Veins: No evidence of thrombus. Normal compressibility, respiratory phasicity and response to augmentation. Venous Reflux:  None. Other Findings:  Peripheral edema noted LEFT  UPPER EXTREMITY Internal Jugular Vein: No  evidence of thrombus. Normal compressibility, respiratory phasicity and response to augmentation. Subclavian Vein: No evidence of thrombus. Normal compressibility, respiratory phasicity and response to augmentation. Axillary Vein: No evidence of thrombus. Normal compressibility, respiratory phasicity and response to augmentation. Cephalic Vein: No evidence of thrombus. Normal compressibility, respiratory phasicity and response to augmentation. Basilic Vein: No evidence of thrombus. Normal compressibility, respiratory phasicity and response to augmentation. Brachial Veins: No evidence of thrombus. Normal compressibility, respiratory phasicity and response to augmentation. Radial Veins: No evidence of thrombus. Normal compressibility, respiratory phasicity and response to augmentation. Ulnar Veins: No evidence of thrombus. Normal compressibility, respiratory phasicity and response to augmentation. Venous Reflux:  None. Other Findings:  But peripheral subcutaneous edema noted. IMPRESSION: Limited exam but no gross occlusive upper extremity DVT in either arm. Electronically Signed   By: Jerilynn Mages.  Shick M.D.   On: 07/16/2017 12:34    ASSESSMENT / PLAN: Gastrointestinal Bleeding  Acute on chronic hypercapnic hypoxic respiratory failure secondary to AECOPD  Acute encephalopathy secondary to hypercapnia  Hx: Hyperlipidemia   P: Supplemental O2 for dyspnea, hypoxia, and/or encephalopathy  Continue bronchodilator therapy and prednisone  Continuous telemetry monitoring Maintain map >65 LR @75  ml/hr Trend CBC  Transfuse for hgb <7  Monitor for s/sx of bleeding  Continue iv protonix  SCD's for VTE prophylaxis avoid chemical prophylaxis  GI consulted appreciate input Trend BMP  Replace electrolytes as indicated  Monitor UOP  Trend WBC and monitor fever curve IV azithromycin   Prn zofran for nausea and vomiting   Marda Stalker, [place] Pager [phone] (please enter 7 digits) PCCM  Consult Pager [phone] (please enter 7 digits)

## 2017-07-18 ENCOUNTER — Encounter: Payer: Self-pay | Admitting: Gastroenterology

## 2017-07-18 DIAGNOSIS — E662 Morbid (severe) obesity with alveolar hypoventilation: Secondary | ICD-10-CM

## 2017-07-18 DIAGNOSIS — K922 Gastrointestinal hemorrhage, unspecified: Secondary | ICD-10-CM

## 2017-07-18 LAB — CBC
HCT: 24.2 % — ABNORMAL LOW (ref 35.0–47.0)
HEMATOCRIT: 24.5 % — AB (ref 35.0–47.0)
Hemoglobin: 7.2 g/dL — ABNORMAL LOW (ref 12.0–16.0)
Hemoglobin: 7.1 g/dL — ABNORMAL LOW (ref 12.0–16.0)
MCH: 28.2 pg (ref 26.0–34.0)
MCH: 28 pg (ref 26.0–34.0)
MCHC: 29.4 g/dL — AB (ref 32.0–36.0)
MCHC: 29.3 g/dL — ABNORMAL LOW (ref 32.0–36.0)
MCV: 95.9 fL (ref 80.0–100.0)
MCV: 95.5 fL (ref 80.0–100.0)
PLATELETS: 176 10*3/uL (ref 150–440)
Platelets: 181 10*3/uL (ref 150–440)
RBC: 2.55 MIL/uL — ABNORMAL LOW (ref 3.80–5.20)
RBC: 2.53 MIL/uL — AB (ref 3.80–5.20)
RDW: 18.4 % — AB (ref 11.5–14.5)
RDW: 18.5 % — ABNORMAL HIGH (ref 11.5–14.5)
WBC: 23.1 10*3/uL — ABNORMAL HIGH (ref 3.6–11.0)
WBC: 21.2 10*3/uL — ABNORMAL HIGH (ref 3.6–11.0)

## 2017-07-18 LAB — TYPE AND SCREEN
ABO/RH(D): A POS
Antibody Screen: NEGATIVE
UNIT DIVISION: 0
Unit division: 0

## 2017-07-18 LAB — BASIC METABOLIC PANEL
Anion gap: 16 — ABNORMAL HIGH (ref 5–15)
BUN: 10 mg/dL (ref 6–20)
CHLORIDE: 98 mmol/L — AB (ref 101–111)
CO2: 37 mmol/L — AB (ref 22–32)
CREATININE: 0.5 mg/dL (ref 0.44–1.00)
Calcium: 9.2 mg/dL (ref 8.9–10.3)
GFR calc non Af Amer: >60 mL/min (ref >60)
Glucose, Bld: 80 mg/dL (ref 65–99)
Potassium: 2.6 mmol/L — CL (ref 3.5–5.1)
Sodium: 151 mmol/L — ABNORMAL HIGH (ref 135–145)

## 2017-07-18 LAB — BPAM RBC
BLOOD PRODUCT EXPIRATION DATE: 201904092359
Blood Product Expiration Date: 201904092359
UNIT TYPE AND RH: 5100
Unit Type and Rh: 5100

## 2017-07-18 MED ORDER — DEXTROSE 5 % IV SOLN
INTRAVENOUS | Status: DC
  Administered 2017-07-18 – 2017-07-19 (×2): via INTRAVENOUS

## 2017-07-18 MED ORDER — ORAL CARE MOUTH RINSE
15.0000 mL | Freq: Two times a day (BID) | OROMUCOSAL | Status: DC
  Administered 2017-07-18 – 2017-07-21 (×7): 15 mL via OROMUCOSAL

## 2017-07-18 MED ORDER — POTASSIUM CHLORIDE CRYS ER 20 MEQ PO TBCR
40.0000 meq | EXTENDED_RELEASE_TABLET | Freq: Two times a day (BID) | ORAL | Status: AC
  Administered 2017-07-18 – 2017-07-19 (×3): 40 meq via ORAL
  Filled 2017-07-18 (×3): qty 2

## 2017-07-18 MED ORDER — IPRATROPIUM-ALBUTEROL 0.5-2.5 (3) MG/3ML IN SOLN
3.0000 mL | Freq: Four times a day (QID) | RESPIRATORY_TRACT | Status: DC
  Administered 2017-07-18 – 2017-07-27 (×38): 3 mL via RESPIRATORY_TRACT
  Filled 2017-07-18 (×40): qty 3

## 2017-07-18 NOTE — Progress Notes (Signed)
Diet ordered for patient per Dr Alva Garnet, writer spoke with patient's son who stated patient drinks thin liquids at home.   When Probation officer entered patient room at 1200 pt was somewhat lethargic, although O2 sats were 100% on her baseline of 2 liters O2 [place].  She was arouseable but would immediately close eyes and chin would return to chest as soon as stimulation ended.  RT called, Dr Alva Garnet also came to room, at this time we have placed patient back on bipap.  When she is more awake we will try 1 liter [place].

## 2017-07-18 NOTE — Anesthesia Postprocedure Evaluation (Signed)
Anesthesia Post Note  Patient: Norberta Fetch  Procedure(s) Performed: ESOPHAGOGASTRODUODENOSCOPY (EGD) WITH PROPOFOL (N/A )  Patient location during evaluation: Endoscopy Anesthesia Type: General Level of consciousness: awake and alert Pain management: pain level controlled Vital Signs Assessment: post-procedure vital signs reviewed and stable Respiratory status: spontaneous breathing, nonlabored ventilation, respiratory function stable and patient connected to nasal cannula oxygen Cardiovascular status: blood pressure returned to baseline and stable Postop Assessment: no apparent nausea or vomiting Anesthetic complications: no     Last Vitals:  Vitals:   07/18/17 0500 07/18/17 0600  BP: (!) 108/91 118/81  Pulse:  (!) 119  Resp: (!) 25 (!) 22  Temp:    SpO2:  100%    Last Pain:  Vitals:   07/17/17 1310  TempSrc: Tympanic                 Precious Haws Sophie Tamez

## 2017-07-18 NOTE — Care Management (Addendum)
This RNCM made a referral to medication management and [facility] last summer as patient did not have health insurance at that time either.  I have sent message to [facility] and [facility] to check status of this patient. RNCM team will follow. At 1034A: RNCM received notification back from [facility]- patient never followed up with them. Waiting on response from [facility]ication [facility]gt.

## 2017-07-18 NOTE — Progress Notes (Signed)
   Name: Kayla Boyer MRN: [id number] DOB: [birth year]    ADMISSION DATE:  07/14/2017  BRIEF PATIENT DESCRIPTION:  58 F hx COPD with recent hosp for GIB due to ulcers presenting with continued drop in Hgb 11 to 9.0.  Pt admitted to stepdown unit 03/15 with acute on chronic hypercapnic hypoxic respiratory failure and acute encephalopathy secondary to AECOPD requiring Bipap.     SIGNIFICANT EVENTS/STUDIES  03/15-Pt admitted to stepdown unit on with acute blood loss anemia, acute encephalopathy, and acute on chronic hypoxic hypercapnic respiratory failure on Bipap  03/17-Pt transferred to [facility] unit 03/18-Pt underwent EGD results revealed normal duodenal bulb and second portion of the  duodenum, duodenal lipoma, non-bleeding gastric ulcer with a clean ulcer base, and normal gastroesophageal junction and esophagus. Rapid response called pt developed acute encephalopathy secondary to acute on chronic hypercapnic respiratory failure likely secondary to sedating medications received during EGD requiring Bipap.    SUBJECTIVE:  Initially lethargic this a.m.  Placed back on BiPAP with improvement in LOC.  Denies pain and dyspnea   VITAL SIGNS: Temp:  [96.9 F (36.1 C)-97.5 F (36.4 C)] 97.5 F (36.4 C) (03/19 1200) Pulse Rate:  [97-125] 118 (03/19 1211) Resp:  [16-35] 30 (03/19 1400) BP: (102-145)/(57-112) 109/65 (03/19 1400) SpO2:  [98 %-100 %] 98 % (03/19 1211) FiO2 (%):  [30 %-50 %] 50 % (03/19 1200)  PHYSICAL EXAMINATION: Chronically ill-appearing, NAD HEENT WNL JVP cannot be visualized No wheezes noted, diffusely diminished breath sounds Regular, no M Obese, soft, nontender Extremities warm, no edema No focal neurologic findings  Recent Labs  Lab 07/15/17 0315 07/17/17 0738 07/18/17 0755  NA 147* 141 151*  K 4.1 3.0* 2.6*  CL 92* 92* 98*  CO2 46* 38* 37*  BUN 13 6 10   CREATININE <0.30* <0.30* 0.50  GLUCOSE 103* 74 80   Recent Labs  Lab 07/16/17 0359 07/17/17 0956  07/18/17 0755  HGB 8.0* 7.9* 7.2*  HCT 25.9* 26.1* 24.5*  WBC 9.8 31.0* 23.1*  PLT 178 181 181   No results found.  ASSESSMENT / PLAN: Recent UGIB  Acute on chronic hypercapnic respiratory failure  Seems to have impaired ventilatory drive   Suspect mostly OHS  Doubt significant COPD (never smoked) Acute encephalopathy secondary to hypercapnia  Hypernatremia Hypokalemia Hx: Hyperlipidemia   P: Trend CBC  Transfuse for hgb <7  Monitor for s/sx of bleeding  Continue IV PPI  Continue BiPAP as needed for lethargy (presumed hypercapnia) Continue mandatory nocturnal BiPAP Continue supplemental oxygen carefully titrated to avoid suppressing ventilatory drive Continue nebulized steroids and bronchodilators Discontinue systemic steroids in absence of wheezing Recheck thyroid profile in a.m. 07/19/17  IVF's adjusted to correct above electrolyte abnormalities    Merton Border, MD PCCM service Mobile [phone] Pager [phone] 07/18/2017 3:35 PM

## 2017-07-18 NOTE — Progress Notes (Signed)
[place] at [place] NAME: Kayla Boyer    MR#:  [id number]  DATE OF BIRTH:  1955/10/24  SUBJECTIVE:  Remains lethargic, had to placed back on BiPAP after initial wean REVIEW OF SYSTEMS:   Review of Systems  Constitutional: Negative for chills, fever and weight loss.  HENT: Negative for ear discharge, ear pain and nosebleeds.   Eyes: Negative for blurred vision, pain and discharge.  Respiratory: Negative for sputum production, shortness of breath, wheezing and stridor.   Cardiovascular: Negative for chest pain, palpitations, orthopnea and PND.  Gastrointestinal: Positive for melena. Negative for abdominal pain, diarrhea, nausea and vomiting.  Genitourinary: Negative for frequency and urgency.  Musculoskeletal: Negative for back pain and joint pain.  Neurological: Positive for weakness. Negative for sensory change, speech change and focal weakness.  Psychiatric/Behavioral: Negative for depression and hallucinations. The patient is not nervous/anxious.    Tolerating Diet: Clear liquid diet Tolerating PT: pending  DRUG ALLERGIES:  No Known Allergies  VITALS:  Blood pressure 121/73, pulse 86, temperature 97.7 F (36.5 C), temperature source Oral, resp. rate (!) 23, height 5' 2.5" (1.588 m), weight 59.9 kg (132 lb 0.9 oz), SpO2 98 %.  PHYSICAL EXAMINATION:   Physical Exam  GENERAL:  62 y.o.-year-old patient lying in the bed with no acute distress.  EYES: Pupils equal, round, reactive to light and accommodation. No scleral icterus. Extraocular muscles intact.  HEENT: Head atraumatic, normocephalic. Oropharynx and nasopharynx clear.  NECK:  Supple, no jugular venous distention. No thyroid enlargement, no tenderness.  LUNGS: decreased breath sounds bilaterally, no wheezing, rales, rhonchi. using accessory muscles of respiration.  CARDIOVASCULAR: S1, S2 normal. No murmurs, rubs, or gallops.  ABDOMEN: Soft, nontender, nondistended.  Bowel sounds present. No organomegaly or mass.  EXTREMITIES: No cyanosis, clubbing or edema b/l.    NEUROLOGIC: Cranial nerves II through XII are intact. No focal Motor or sensory deficits b/l.   PSYCHIATRIC:  patient is lethargic SKIN: No obvious rash, lesion, or ulcer.   LABORATORY PANEL:  CBC Recent Labs  Lab 07/18/17 1722  WBC 21.2*  HGB 7.1*  HCT 24.2*  PLT 176    Chemistries  Recent Labs  Lab 07/14/17 1652  07/18/17 0755  NA 144   < > 151*  K 4.0   < > 2.6*  CL 86*   < > 98*  CO2 50*   < > 37*  GLUCOSE 160*   < > 80  BUN 19   < > 10  CREATININE <0.30*   < > 0.50  CALCIUM 9.8   < > 9.2  AST 17  --   --   ALT 13*  --   --   ALKPHOS 48  --   --   BILITOT 0.5  --   --    < > = values in this interval not displayed.   Cardiac Enzymes No results for input(s): TROPONINI in the last 168 hours. RADIOLOGY:  No results found. ASSESSMENT AND PLAN:  Kayla Boyer  is a 62 y.o. female with a known history of gastrointestinal bleeding, gastric ulcer, hyperlipidemia, emphysema of lung was referred by ER physician Dr.malinda for weakness and dark stools.  Patient presented to the emergency room with generalized weakness.  She has noticed a dark black stools for the last few days   * Acute on chronic hypoxic respiratory failure secondary to COPD exacerbation. -requiring Bipap - she's very lethargic at times  * Hypernatreamia/hypokalemia - started on  D5 and getting K repleted greesively  * acute on chornic gastrointestinal bleeding. - s/p EGD 3/18 showing Normal duodenal bulb and second portion of the duodenum. Duodenal lipoma. Non-bleeding gastric ulcer with a clean ulcer base (Forrest Class III).Normal gastroesophageal junction and esophagus. - protonix 40 mg IV bid  *Symptomatic anemia with weakness PRBC transfusion as needed     Case discussed with Care Management/Social Worker. Management plans discussed with the patient, Dr Alva Garnet, family at bedside and they  are in agreement.  CODE STATUS: Full  DVT Prophylaxis: scd  TOTAL TIME TAKING CARE OF THIS PATIENT: 15 minutes.  >50% time spent on counselling and coordination of care  POSSIBLE D/C IN 2-3 DAYS, DEPENDING ON CLINICAL CONDITION.  Note: This dictation was prepared with Dragon dictation along with smaller phrase technology. Any transcriptional errors that result from this process are unintentional.  Max Sane M.D on 07/18/2017 at 7:31 PM  Between 7am to 6pm - Pager - [phone]  After 6pm go to [email or web address] - password [email or web address]  [facility]  Office  [phone]  CC: Primary care physician; Patient, No Pcp PerPatient ID: Kayla Boyer, female   DOB: 05-02-1956, 62 y.o.   MRN: [id number]

## 2017-07-18 NOTE — Progress Notes (Signed)
   07/18/17 1520  Clinical Encounter Type  Visited With Patient and family together  Visit Type Follow-up   Introductory chaplain visit.  Patient indicated that she did not want a visit today.

## 2017-07-19 ENCOUNTER — Inpatient Hospital Stay: Payer: Medicaid Other

## 2017-07-19 LAB — CBC
HCT: 22.6 % — ABNORMAL LOW (ref 35.0–47.0)
Hemoglobin: 6.8 g/dL — ABNORMAL LOW (ref 12.0–16.0)
MCH: 28 pg (ref 26.0–34.0)
MCHC: 30.1 g/dL — ABNORMAL LOW (ref 32.0–36.0)
MCV: 93 fL (ref 80.0–100.0)
Platelets: 165 K/uL (ref 150–440)
RBC: 2.43 MIL/uL — ABNORMAL LOW (ref 3.80–5.20)
RDW: 18.2 % — ABNORMAL HIGH (ref 11.5–14.5)
WBC: 18.7 K/uL — ABNORMAL HIGH (ref 3.6–11.0)

## 2017-07-19 LAB — BLOOD GAS, ARTERIAL
Acid-Base Excess: 24.6 mmol/L — ABNORMAL HIGH (ref 0.0–2.0)
Bicarbonate: 53.2 mmol/L — ABNORMAL HIGH (ref 20.0–28.0)
DELIVERY SYSTEMS: POSITIVE
EXPIRATORY PAP: 5
FIO2: 0.25
INSPIRATORY PAP: 14
O2 Saturation: 94.5 %
Patient temperature: 37
pCO2 arterial: 92 mmHg (ref 32.0–48.0)
pH, Arterial: 7.37 (ref 7.350–7.450)
pO2, Arterial: 75 mmHg — ABNORMAL LOW (ref 83.0–108.0)

## 2017-07-19 LAB — COMPREHENSIVE METABOLIC PANEL
ALBUMIN: 2.5 g/dL — AB (ref 3.5–5.0)
ALT: 14 U/L (ref 14–54)
ANION GAP: 10 (ref 5–15)
AST: 20 U/L (ref 15–41)
Alkaline Phosphatase: 40 U/L (ref 38–126)
BILIRUBIN TOTAL: 1 mg/dL (ref 0.3–1.2)
BUN: 9 mg/dL (ref 6–20)
CHLORIDE: 94 mmol/L — AB (ref 101–111)
CO2: 41 mmol/L — ABNORMAL HIGH (ref 22–32)
Calcium: 9.1 mg/dL (ref 8.9–10.3)
Creatinine, Ser: <0.3 mg/dL — ABNORMAL LOW (ref 0.44–1.00)
GLUCOSE: 92 mg/dL (ref 65–99)
POTASSIUM: 3 mmol/L — AB (ref 3.5–5.1)
SODIUM: 145 mmol/L (ref 135–145)
TOTAL PROTEIN: 5.7 g/dL — AB (ref 6.5–8.1)

## 2017-07-19 LAB — T4, FREE: Free T4: 1.2 ng/dL — ABNORMAL HIGH (ref 0.61–1.12)

## 2017-07-19 LAB — PREPARE RBC (CROSSMATCH)

## 2017-07-19 LAB — TSH: TSH: 0.345 u[IU]/mL — ABNORMAL LOW (ref 0.350–4.500)

## 2017-07-19 LAB — MAGNESIUM: Magnesium: 1.2 mg/dL — ABNORMAL LOW (ref 1.7–2.4)

## 2017-07-19 LAB — PHOSPHORUS: PHOSPHORUS: 1.9 mg/dL — AB (ref 2.5–4.6)

## 2017-07-19 MED ORDER — POTASSIUM CHLORIDE 2 MEQ/ML IV SOLN
INTRAVENOUS | Status: DC
  Administered 2017-07-19 – 2017-07-20 (×2): via INTRAVENOUS
  Filled 2017-07-19 (×3): qty 1000

## 2017-07-19 MED ORDER — MAGNESIUM SULFATE 2 GM/50ML IV SOLN
2.0000 g | Freq: Once | INTRAVENOUS | Status: AC
  Administered 2017-07-19: 2 g via INTRAVENOUS
  Filled 2017-07-19: qty 50

## 2017-07-19 MED ORDER — ENSURE ENLIVE PO LIQD
237.0000 mL | Freq: Three times a day (TID) | ORAL | Status: DC
  Administered 2017-07-19 – 2017-07-22 (×7): 237 mL via ORAL

## 2017-07-19 MED ORDER — SODIUM CHLORIDE 0.9 % IV SOLN
Freq: Once | INTRAVENOUS | Status: AC
  Administered 2017-07-19: 09:00:00 via INTRAVENOUS

## 2017-07-19 MED ORDER — POTASSIUM CHLORIDE 10 MEQ/100ML IV SOLN: 10.0000 meq | INTRAVENOUS | Status: DC

## 2017-07-19 MED ORDER — POTASSIUM PHOSPHATES 15 MMOLE/5ML IV SOLN
30.0000 mmol | Freq: Once | INTRAVENOUS | Status: AC
  Administered 2017-07-19: 30 mmol via INTRAVENOUS
  Filled 2017-07-19: qty 10

## 2017-07-19 NOTE — Care Management (Signed)
Late Entry:  Per son Kayla Boyer he was not aware that medications were sent to Medication Management previous admission and purchased out of pocket.  Son was not relayed this information at previous discharge, as husband was at bedside.   This admission son Kayla Boyer was provided information about [employer], applications for [facility]ication [facility] ,[place] and "[employer]:  Your Guide to [employer] and [employer] in [place]"  Booklet

## 2017-07-19 NOTE — Progress Notes (Signed)
[place] Progress Note Patient Name: Fiora Weill DOB: [birth year] MRN: [id number]   Date of Service  07/19/2017  HPI/Events of Note  Multiple issues: 1. Hgb = 6.8 and 2. Mg ++ 1.2, K+ = 3.0 , PO4--- = 1.9 and Creatinine < 0.30  eICU Interventions  Will order: 1. Transfuse 1 unit PRBC. 2. Replace Mg++, K+ and PO4---.     Intervention Category Major Interventions: Electrolyte abnormality - evaluation and management;Other:  Cathlin Buchan Cornelia Copa 07/19/2017, 6:05 AM

## 2017-07-19 NOTE — Plan of Care (Signed)
Attempted to speak with patient and introduced myself, she shook her head and said "not now". I asked if there is someone that would help her make decisions, perhaps her husband, and she shook her head and identified her son. I asked if I could speak with him  and she said "no, not now."   Will re attempt visit tomorrow.

## 2017-07-19 NOTE — Care Management (Signed)
RNCM received notification from Medication management that patient did fill some medications with them however she never returned financial documents needed to continue services.  Patient must provide documents to receive assistance from both [facility] and [facility].

## 2017-07-19 NOTE — Progress Notes (Addendum)
   Name: Kayla Boyer MRN: [id number] DOB: 1956-04-13    ADMISSION DATE:  07/14/2017  BRIEF PATIENT DESCRIPTION:  40 F hx COPD with recent hosp for GIB due to ulcers presenting with continued drop in Hgb 11 to 9.0.  Pt admitted to stepdown unit 03/15 with acute on chronic hypercapnic hypoxic respiratory failure and acute encephalopathy secondary to AECOPD requiring Bipap.     SIGNIFICANT EVENTS/STUDIES  03/15-Pt admitted to stepdown unit on with acute blood loss anemia, acute encephalopathy, and acute on chronic hypoxic hypercapnic respiratory failure on Bipap  03/17-Pt transferred to [facility] unit 03/18-Pt underwent EGD results revealed normal duodenal bulb and second portion of the  duodenum, duodenal lipoma, non-bleeding gastric ulcer with a clean ulcer base, and normal gastroesophageal junction and esophagus. Rapid response called pt developed acute encephalopathy secondary to acute on chronic hypercapnic respiratory failure likely secondary to sedating medications received during EGD requiring Bipap.  03/19 still requiring intermittent BiPAP 03/20 more awake and alert.  Cognition intact.  Still requiring intermittent BiPAP   SUBJECTIVE:  Presently comfortable on low-flow nasal cannula oxygen.  Denies dyspnea and pain.  No new complaints.   VITAL SIGNS: Temp:  [97.5 F (36.4 C)-98.1 F (36.7 C)] 97.6 F (36.4 C) (03/20 1200) Pulse Rate:  [86-130] 102 (03/20 1300) Resp:  [14-35] 22 (03/20 1300) BP: (97-148)/(42-106) 110/42 (03/20 1300) SpO2:  [85 %-100 %] 95 % (03/20 1300) FiO2 (%):  [25 %-35 %] 28 % (03/20 0905)  PHYSICAL EXAMINATION: Chronically ill-appearing, no respiratory distress HEENT: NCAT, sclerae white JVP cannot be visualized Diffusely diminished breath sounds, no wheezes or other adventitious sounds Heart sounds, regular, no M Obese, soft, nontender Extremities warm without edema Cranial nerves intact, moves all extremities  Recent Labs  Lab 07/17/17 0738  07/18/17 0755 07/19/17 0316  NA 141 151* 145  K 3.0* 2.6* 3.0*  CL 92* 98* 94*  CO2 38* 37* 41*  BUN 6 10 9   CREATININE <0.30* 0.50 <0.30*  GLUCOSE 74 80 92   Recent Labs  Lab 07/18/17 0755 07/18/17 1722 07/19/17 0316  HGB 7.2* 7.1* 6.8*  HCT 24.5* 24.2* 22.6*  WBC 23.1* 21.2* 18.7*  PLT 181 176 165   CXR: Mild bibasilar atelectasis with very small bilateral effusions  ASSESSMENT / PLAN: Recent UGIB  Acute blood loss anemia Acute on chronic hypercapnic respiratory failure due primarily to impaired ventilatory drive  Suspect mostly OHS  Doubt significant COPD (never smoked) Acute encephalopathy secondary to hypercapnia, resolving Hypernatremia, improved Hypokalemia, persistent Hx: Hyperlipidemia   P: DVT px: SCDs Monitor CBC intermittently Transfuse 1 unit RBCs 03/20 Monitor for s/sx of bleeding  Continue IV PPI  Continue BiPAP as needed for lethargy Continue mandatory nocturnal BiPAP Continue supplemental oxygen carefully titrated to SpO2 goal of 88-94% Continue nebulized steroids and bronchodilators   Continue D5W plus potassium infusion  Needs to remain in SDU until she can last off of BiPAP for a whole day    Merton Border, MD PCCM service Mobile [phone] Pager [phone] 07/19/2017 1:37 PM

## 2017-07-19 NOTE — Progress Notes (Signed)
[place] at [place] NAME: Renella Steig    MR#:  [id number]  DATE OF BIRTH:  01/30/1956  SUBJECTIVE:  Appears to be a poor historian REVIEW OF SYSTEMS:   Review of Systems  Constitutional: Negative for chills, fever and weight loss.  HENT: Negative for ear discharge, ear pain and nosebleeds.   Eyes: Negative for blurred vision, pain and discharge.  Respiratory: Negative for sputum production, shortness of breath, wheezing and stridor.   Cardiovascular: Negative for chest pain, palpitations, orthopnea and PND.  Gastrointestinal: Positive for melena. Negative for abdominal pain, diarrhea, nausea and vomiting.  Genitourinary: Negative for frequency and urgency.  Musculoskeletal: Negative for back pain and joint pain.  Neurological: Positive for weakness. Negative for sensory change, speech change and focal weakness.  Psychiatric/Behavioral: Negative for depression and hallucinations. The patient is not nervous/anxious.    Tolerating Diet: Clear liquid diet Tolerating PT: pending  DRUG ALLERGIES:  No Known Allergies  VITALS:  Blood pressure 124/79, pulse (!) 105, temperature 97.6 F (36.4 C), temperature source Oral, resp. rate (!) 27, height 5' 2.5" (1.588 m), weight 59.9 kg (132 lb 0.9 oz), SpO2 94 %.  PHYSICAL EXAMINATION:   Physical Exam  GENERAL:  62 y.o.-year-old patient lying in the bed with no acute distress.  EYES: Pupils equal, round, reactive to light and accommodation. No scleral icterus. Extraocular muscles intact.  HEENT: Head atraumatic, normocephalic. Oropharynx and nasopharynx clear.  NECK:  Supple, no jugular venous distention. No thyroid enlargement, no tenderness.  LUNGS: decreased breath sounds bilaterally, no wheezing, rales, rhonchi. CARDIOVASCULAR: S1, S2 normal. No murmurs, rubs, or gallops.  ABDOMEN: Soft, nontender, nondistended. Bowel sounds present. No organomegaly or mass.  EXTREMITIES: No cyanosis,  clubbing or edema b/l.    NEUROLOGIC: Cranial nerves II through XII are intact. No focal Motor or sensory deficits b/l.   PSYCHIATRIC:  patient is lethargic SKIN: No obvious rash, lesion, or ulcer.   LABORATORY PANEL:  CBC Recent Labs  Lab 07/19/17 0316  WBC 18.7*  HGB 6.8*  HCT 22.6*  PLT 165    Chemistries  Recent Labs  Lab 07/19/17 0316  NA 145  K 3.0*  CL 94*  CO2 41*  GLUCOSE 92  BUN 9  CREATININE <0.30*  CALCIUM 9.1  MG 1.2*  AST 20  ALT 14  ALKPHOS 40  BILITOT 1.0   Cardiac Enzymes No results for input(s): TROPONINI in the last 168 hours. RADIOLOGY:  Dg Chest Port 1 View  Result Date: 07/19/2017 CLINICAL DATA:  Respiratory failure EXAM: PORTABLE CHEST 1 VIEW COMPARISON:  July 15, 2017 FINDINGS: There are small pleural effusions bilaterally. There is consolidation in the left lower lobe. Lungs elsewhere are clear. Heart size is upper normal with pulmonary vascularity within normal limits. No adenopathy. There is degenerative change in the thoracic spine. IMPRESSION: Left lower lobe consolidation with small pleural effusions bilaterally. There is mild bibasilar atelectasis. Stable cardiac silhouette. Electronically Signed   By: Lowella Grip III M.D.   On: 07/19/2017 07:57   ASSESSMENT AND PLAN:  Aaryana Betke  is a 62 y.o. female with a known history of gastrointestinal bleeding, gastric ulcer, hyperlipidemia, emphysema of lung was referred by ER physician Dr.malinda for weakness and dark stools.  Patient presented to the emergency room with generalized weakness.  She has noticed a dark black stools for the last few days   * Acute on chronic hypoxic respiratory failure secondary to COPD exacerbation. -requiring prn Bipap -  she's very lethargic at times  * Hypernatreamia/hypokalemia - started on D5 and getting K repleted   * acute on chornic gastrointestinal bleeding. - s/p EGD 3/18 showing Normal duodenal bulb and second portion of the duodenum. Duodenal  lipoma. Non-bleeding gastric ulcer with a clean ulcer base (Forrest Class III).Normal gastroesophageal junction and esophagus. - protonix 40 mg IV bid -1 unit BT today  *Symptomatic anemia with weakness PRBC transfusion as needed 1 unit BT today    Case discussed with Care Management/Social Worker. Management plans discussed with the patient, Dr Alva Garnet, family at bedside and they are in agreement.  CODE STATUS: Full  DVT Prophylaxis: scd  TOTAL TIME TAKING CARE OF THIS PATIENT: 25 minutes.  >50% time spent on counselling and coordination of care  POSSIBLE D/C IN 2-3 DAYS, DEPENDING ON CLINICAL CONDITION.  Note: This dictation was prepared with Dragon dictation along with smaller phrase technology. Any transcriptional errors that result from this process are unintentional.  Fritzi Mandes M.D on 07/19/2017 at 3:40 PM  Between 7am to 6pm - Pager - [phone]  After 6pm go to [email or web address] - password [email or web address]  [facility] [place]  Office  [phone]  CC: Primary care physician; Patient, No Pcp PerPatient ID: [name], female   DOB: [birth year], 62 y.o.   MRN: [id number]

## 2017-07-19 NOTE — Progress Notes (Signed)
Pt alert and responsive this shift on approx .5 liter O2 [place], used BiPap briefly this AM at her request.  She continues to decline to eat, states she has "no appetite"  Did request Ensure which she has been tolerating well.  PRBCs transfused this AM, electrolytes replaced.  Pt is mobilized in bed as much as possible as she prefers the bed to be in chair position, She continues to slump forward at the waist with her chin on her chest and sleep most of the time; she has been instructed repeatedly about this posture and how it makes it much harder for her to breathe and she has demonstrated that she is able to sit upright and hold her head up but she soon returns to her favored position.  Her son states that his mother sits in her wheelchair in this position most of the time at home because she says it is comfortable. He states she has been in a wheelchair for three years because she broke her ankle at that time and she has not walked since?    Continue to monitor patient; she may transfer to floor tomorrow if she stays off BiPap.

## 2017-07-20 ENCOUNTER — Inpatient Hospital Stay: Payer: Medicaid Other

## 2017-07-20 ENCOUNTER — Encounter: Payer: Self-pay | Admitting: Radiology

## 2017-07-20 LAB — TYPE AND SCREEN
ABO/RH(D): A POS
Antibody Screen: NEGATIVE
Unit division: 0

## 2017-07-20 LAB — BPAM RBC
Blood Product Expiration Date: 201904072359
ISSUE DATE / TIME: 201903200842
UNIT TYPE AND RH: 6200

## 2017-07-20 LAB — CBC
HCT: 31.4 % — ABNORMAL LOW (ref 35.0–47.0)
Hemoglobin: 9.7 g/dL — ABNORMAL LOW (ref 12.0–16.0)
MCH: 28.6 pg (ref 26.0–34.0)
MCHC: 30.8 g/dL — AB (ref 32.0–36.0)
MCV: 92.6 fL (ref 80.0–100.0)
PLATELETS: 200 10*3/uL (ref 150–440)
RBC: 3.39 MIL/uL — ABNORMAL LOW (ref 3.80–5.20)
RDW: 18.6 % — AB (ref 11.5–14.5)
WBC: 15.5 10*3/uL — ABNORMAL HIGH (ref 3.6–11.0)

## 2017-07-20 LAB — BASIC METABOLIC PANEL
Anion gap: 6 (ref 5–15)
BUN: 7 mg/dL (ref 6–20)
CO2: 48 mmol/L — ABNORMAL HIGH (ref 22–32)
Calcium: 9.3 mg/dL (ref 8.9–10.3)
Chloride: 94 mmol/L — ABNORMAL LOW (ref 101–111)
Glucose, Bld: 132 mg/dL — ABNORMAL HIGH (ref 65–99)
Potassium: 4.4 mmol/L (ref 3.5–5.1)
SODIUM: 148 mmol/L — AB (ref 135–145)

## 2017-07-20 LAB — VITAMIN B12: VITAMIN B 12: 3282 pg/mL — AB (ref 180–914)

## 2017-07-20 LAB — FOLATE: FOLATE: 5 ng/mL — AB (ref >5.9)

## 2017-07-20 LAB — T3, FREE: T3, Free: 1.3 pg/mL — ABNORMAL LOW (ref 2.0–4.4)

## 2017-07-20 MED ORDER — PANTOPRAZOLE SODIUM 40 MG PO TBEC
40.0000 mg | DELAYED_RELEASE_TABLET | Freq: Two times a day (BID) | ORAL | Status: DC
  Administered 2017-07-21 – 2017-07-25 (×8): 40 mg via ORAL
  Filled 2017-07-20 (×9): qty 1

## 2017-07-20 MED ORDER — THYROID 30 MG PO TABS
30.0000 mg | ORAL_TABLET | Freq: Every day | ORAL | Status: DC
  Administered 2017-07-21 – 2017-07-25 (×4): 30 mg via ORAL
  Filled 2017-07-20 (×5): qty 1

## 2017-07-20 MED ORDER — TECHNETIUM TC 99M-LABELED RED BLOOD CELLS IV KIT
20.0000 | PACK | Freq: Once | INTRAVENOUS | Status: AC | PRN
  Administered 2017-07-20: 20 via INTRAVENOUS

## 2017-07-20 MED ORDER — DEXTROSE 5 % IV SOLN
INTRAVENOUS | Status: DC
  Administered 2017-07-20 – 2017-07-27 (×9): via INTRAVENOUS
  Administered 2017-07-30: 25 mL/h via INTRAVENOUS
  Administered 2017-07-31 – 2017-08-06 (×6): via INTRAVENOUS

## 2017-07-20 NOTE — Progress Notes (Signed)
Kayla Boyer , MD [address], [place], [place], [place], [address] [address], [place], [place], [place], [address] Phone: [phone]  Fax: [phone]   Kayla Boyer is being followed for Gi bleed   Subjective: Patient appears very lethargic , on bipap , very drowsy   Objective: Vital signs in last 24 hours: Vitals:   07/20/17 0500 07/20/17 0600 07/20/17 0700 07/20/17 0800  BP: (!) 146/81 (!) 146/82 113/72 127/61  Pulse:  (!) 104  96  Resp: (!) 38 (!) 40 (!) 26 (!) 36  Temp:    98 F (36.7 C)  TempSrc:    Oral  SpO2:  99%  97%  Weight:      Height:       Weight change:   Intake/Output Summary (Last 24 hours) at 07/20/2017 1014 Last data filed at 07/20/2017 0813 Gross per 24 hour  Intake 1685.83 ml  Output 680 ml  Net 1005.83 ml     Exam: Heart:: Regular rate and rhythm, S1S2 present or without murmur or extra heart sounds Lungs: poor air entery b/l  Abdomen: soft, nontender, normal bowel sounds   Lab Results: @LABTEST2 @ Micro Results: Recent Results (from the past 240 hour(s))  MRSA PCR Screening     Status: None   Collection Time: 07/15/17  2:07 AM  Result Value Ref Range Status   MRSA by PCR NEGATIVE NEGATIVE Final    Comment:        The GeneXpert MRSA Assay (FDA approved for NASAL specimens only), is one component of a comprehensive MRSA colonization surveillance program. It is not intended to diagnose MRSA infection nor to guide or monitor treatment for MRSA infections. Performed at [facility], [address] [place], [place] [address]    Studies/Results: Dg Chest Port 1 View  Result Date: 07/19/2017 CLINICAL DATA:  Respiratory failure EXAM: PORTABLE CHEST 1 VIEW COMPARISON:  July 15, 2017 FINDINGS: There are small pleural effusions bilaterally. There is consolidation in the left lower lobe. Lungs elsewhere are clear. Heart size is upper normal with pulmonary vascularity within normal limits. No adenopathy. There is  degenerative change in the thoracic spine. IMPRESSION: Left lower lobe consolidation with small pleural effusions bilaterally. There is mild bibasilar atelectasis. Stable cardiac silhouette. Electronically Signed   By: Lowella Grip III M.D.   On: 07/19/2017 07:57   Medications: I have reviewed the patient's current medications. Scheduled Meds: . feeding supplement (ENSURE ENLIVE)  237 mL Oral TID BM  . ipratropium-albuterol  3 mL Nebulization Q6H  . mouth rinse  15 mL Mouth Rinse BID  . pantoprazole  40 mg Oral BID AC  . [START ON 07/21/2017] thyroid  30 mg Oral QAC breakfast   Continuous Infusions: . dextrose     PRN Meds:.acetaminophen **OR** acetaminophen, metoprolol tartrate, [DISCONTINUED] ondansetron **OR** ondansetron (ZOFRAN) IV   Assessment: Active Problems:   GI bleed   Chronic gastric ulcer without hemorrhage and without perforation  Kayla Boyer 62 y.o. female with history , iron deficiency anemia . Admitted to ICU for respiratory failure on 07/15/17 , required BIPAP. EGD in 06/2017 non bleeding gastric ulcer treated with cautery . 07/17/17 - EGD repeated -non bleeding gastric ulcer seen.  Hb has been gradually trending down .    Plan: 1. Check b12 ,folate 2. An acute gastric ulcer should not cause iron deficiency anemia . She may have two issues going on- acutely the bleeding ulcer but also a chronic bleed which may be either small bowel  or colon. At present her breathing is too poor to undergo any procedures Discussed with Dr Posey Pronto to proceed with tagged RBC scan . I do not think she can drink a bowel prep to undergo a colonoscopy and if EGD needed very likely will need to be intubated.     LOS: 6 days   Kayla Bellows, MD 07/20/2017, 10:14 AM

## 2017-07-20 NOTE — Progress Notes (Addendum)
Writer to NM with patient.  X2 monitor on, O2 checked periodically with portable.  RT accompanied Probation officer with Bipap to NM and set up BiPAP for patient NM room.  Patient positioned for test and made comfortable. Pt was on [place] before leaving room ICU08, but stated she felt like "I'm dying," when writer asked why she felt that way patient said "I can't get my breath."  O2 sats were in low nineties but writer placed patient on BiPap anyway d/y pt's discomfort.

## 2017-07-20 NOTE — Progress Notes (Signed)
Pt returned to room ICU 08 approx 1500.  She had visitors; Probation officer attempted to remove BiPap and place her on [place] .5 liter, After a minute or so writer inquired if pt would like Ensure, pt stated "I can't breathe."  O2 sats 85%, pt requested BiPap back on.  Writer placed BiPap for her at 28% FIO2.  Pt found with significant urine incontinence episode (purewick disconnected while in NM.)  Pt was turned and cleaned up, linens changed, Purewick reconnected.  O2 sats 100% afterward, FIO2 reduced to 24%.  Pt resting comfortably.  NM scan negative for active bleeding

## 2017-07-20 NOTE — Plan of Care (Signed)
Patient for blood scan today. Patient is amenable to a meeting with her son tomorrow. Family meeting tomorrow at 12:00.

## 2017-07-20 NOTE — Progress Notes (Signed)
PT Cancellation Note  Patient Details Name: Kayla Boyer MRN: [id number] DOB: 12/13/1955   Cancelled Treatment:      PT consult received,chart reviewed.  Pt out of room for testing and per OT note Pt is currently on BIPAP.  Per PT protocol, Pt not appropriate, due to BIPAP, for PT at this time.  Will reattempt eval/treatment at a later date/time when medically appropriate.   [name] Mondrian-Pardue, SPT 07/20/2017, 2:38 PM

## 2017-07-20 NOTE — Progress Notes (Signed)
When writer entered room for initial assessment this AM  significant red blood was observed on patient's bedpad underneath her.  Pt was turned, bathed, and bed changed.  Afterward she was not arouseable to her name so bipap was placed for approx 30 minutes.  At that time she was able to tolerate .5 liter [place] and was rouseable.  Drs Posey Pronto and Alva Garnet were told of the findings of blood.  Dr Posey Pronto was shown the soiled bedpad.  She has ordered a bleeding scan.  Writer will contact IV tem for 20 gg IV as patient has been a very difficult stick, even for lab draws

## 2017-07-20 NOTE — Progress Notes (Signed)
[facility] at [place] NAME: Kayla Boyer    MR#:  [id number]  DATE OF BIRTH:  09-03-1955  SUBJECTIVE:  Appears to be a poor historian Per nurse patient had bright red blood per rectum today. REVIEW OF SYSTEMS:   Review of Systems  Constitutional: Negative for chills, fever and weight loss.  HENT: Negative for ear discharge, ear pain and nosebleeds.   Eyes: Negative for blurred vision, pain and discharge.  Respiratory: Negative for sputum production, shortness of breath, wheezing and stridor.   Cardiovascular: Negative for chest pain, palpitations, orthopnea and PND.  Gastrointestinal: Positive for melena. Negative for abdominal pain, diarrhea, nausea and vomiting.  Genitourinary: Negative for frequency and urgency.  Musculoskeletal: Negative for back pain and joint pain.  Neurological: Positive for weakness. Negative for sensory change, speech change and focal weakness.  Psychiatric/Behavioral: Negative for depression and hallucinations. The patient is not nervous/anxious.    Tolerating Diet: Clear liquid diet Tolerating PT: pending  DRUG ALLERGIES:  No Known Allergies  VITALS:  Blood pressure 111/73, pulse (!) 103, temperature 98 F (36.7 C), temperature source Oral, resp. rate (!) 29, height 5' 2.5" (1.588 m), weight 59.9 kg (132 lb 0.9 oz), SpO2 94 %.  PHYSICAL EXAMINATION:   Physical Exam  GENERAL:  62 y.o.-year-old patient lying in the bed with no acute distress.  EYES: Pupils equal, round, reactive to light and accommodation. No scleral icterus. Extraocular muscles intact.  HEENT: Head atraumatic, normocephalic. Oropharynx and nasopharynx clear.  NECK:  Supple, no jugular venous distention. No thyroid enlargement, no tenderness.  LUNGS: decreased breath sounds bilaterally, no wheezing, rales, rhonchi. CARDIOVASCULAR: S1, S2 normal. No murmurs, rubs, or gallops.  ABDOMEN: Soft, nontender, nondistended. Bowel sounds  present. No organomegaly or mass.  EXTREMITIES: No cyanosis, clubbing or edema b/l.    NEUROLOGIC: Cranial nerves II through XII are intact. No focal Motor or sensory deficits b/l.   PSYCHIATRIC:  patient is lethargic SKIN: No obvious rash, lesion, or ulcer.   LABORATORY PANEL:  CBC Recent Labs  Lab 07/20/17 0425  WBC 15.5*  HGB 9.7*  HCT 31.4*  PLT 200    Chemistries  Recent Labs  Lab 07/19/17 0316 07/20/17 0425  NA 145 148*  K 3.0* 4.4  CL 94* 94*  CO2 41* 48*  GLUCOSE 92 132*  BUN 9 7  CREATININE <0.30* <0.30*  CALCIUM 9.1 9.3  MG 1.2*  --   AST 20  --   ALT 14  --   ALKPHOS 40  --   BILITOT 1.0  --    Cardiac Enzymes No results for input(s): TROPONINI in the last 168 hours. RADIOLOGY:  Dg Chest Port 1 View  Result Date: 07/19/2017 CLINICAL DATA:  Respiratory failure EXAM: PORTABLE CHEST 1 VIEW COMPARISON:  July 15, 2017 FINDINGS: There are small pleural effusions bilaterally. There is consolidation in the left lower lobe. Lungs elsewhere are clear. Heart size is upper normal with pulmonary vascularity within normal limits. No adenopathy. There is degenerative change in the thoracic spine. IMPRESSION: Left lower lobe consolidation with small pleural effusions bilaterally. There is mild bibasilar atelectasis. Stable cardiac silhouette. Electronically Signed   By: Lowella Grip III M.D.   On: 07/19/2017 07:57   ASSESSMENT AND PLAN:  Kayla Boyer  is a 62 y.o. female with a known history of gastrointestinal bleeding, gastric ulcer, hyperlipidemia, emphysema of lung was referred by ER physician Dr.malinda for weakness and dark stools.  Patient presented to  the emergency room with generalized weakness.  She has noticed a dark black stools for the last few days   * Acute on chronic hypoxic respiratory failure secondary to COPD exacerbation. -requiring prn Bipap - she's very lethargic at times  * Hypernatreamia/hypokalemia - started on D5 and getting K repleted    * acute on chornic gastrointestinal bleeding. - s/p EGD 3/18 showing Normal duodenal bulb and second portion of the duodenum. Duodenal lipoma. Non-bleeding gastric ulcer with a clean ulcer base (Forrest Class III).Normal gastroesophageal junction and esophagus. - protonix 40 mg IV bid - hemoglobin 9.7--8.0-- 7.9 --- 7.2 --- 7.1 --6.8 1 unit BT today--9.7 -Given rectal bleed discussed with Dr. Vicente Males will get GI bleeding scan  *Symptomatic anemia with weakness PRBC transfusion as needed     Case discussed with Care Management/Social Worker. Management plans discussed with the patient, Dr Alva Garnet, family at bedside and they are in agreement.  CODE STATUS: Full  DVT Prophylaxis: scd  TOTAL TIME TAKING CARE OF THIS PATIENT: 25 minutes.  >50% time spent on counselling and coordination of care  POSSIBLE D/C IN 2-3 DAYS, DEPENDING ON CLINICAL CONDITION.  Note: This dictation was prepared with Dragon dictation along with smaller phrase technology. Any transcriptional errors that result from this process are unintentional.  Fritzi Mandes M.D on 07/20/2017 at 11:42 AM  Between 7am to 6pm - Pager - [phone]  After 6pm go to [email or web address] - password [email or web address]  [facility]  Office  [phone]  CC: Primary care physician; Patient, No Pcp PerPatient ID: Kayla Boyer, female   DOB: 1955-06-30, 62 y.o.   MRN: [id number]

## 2017-07-20 NOTE — Progress Notes (Signed)
OT Cancellation Note  Patient Details Name: Kayla Boyer MRN: [id number] DOB: 06-25-1955   Cancelled Treatment:    Reason Eval/Treat Not Completed: Medical issues which prohibited therapy. Order received, chart reviewed. Spoke with RN who reported pt now placed back on BiPAP. Will re-attempt at later date/time as pt is medically appropriate for OT evaluation.   Jeni Salles, MPH, MS, OTR/L ascom [phone] 07/20/17, 11:07 AM

## 2017-07-20 NOTE — Progress Notes (Signed)
[facility] at [place] NAME: Kayla Boyer    MR#:  [id number]  DATE OF BIRTH:  Jan 05, 1956  SUBJECTIVE:  Appears to be a poor historian Per nurse patient had bright red blood per rectum today. REVIEW OF SYSTEMS:   Review of Systems  Constitutional: Negative for chills, fever and weight loss.  HENT: Negative for ear discharge, ear pain and nosebleeds.   Eyes: Negative for blurred vision, pain and discharge.  Respiratory: Negative for sputum production, shortness of breath, wheezing and stridor.   Cardiovascular: Negative for chest pain, palpitations, orthopnea and PND.  Gastrointestinal: Positive for melena. Negative for abdominal pain, diarrhea, nausea and vomiting.  Genitourinary: Negative for frequency and urgency.  Musculoskeletal: Negative for back pain and joint pain.  Neurological: Positive for weakness. Negative for sensory change, speech change and focal weakness.  Psychiatric/Behavioral: Negative for depression and hallucinations. The patient is not nervous/anxious.    Tolerating Diet: Clear liquid diet Tolerating PT: pending  DRUG ALLERGIES:  No Known Allergies  VITALS:  Blood pressure 136/75, pulse 95, temperature 98 F (36.7 C), temperature source Oral, resp. rate (!) 34, height 5' 2.5" (1.588 m), weight 59.9 kg (132 lb 0.9 oz), SpO2 92 %.  PHYSICAL EXAMINATION:   Physical Exam  GENERAL:  62 y.o.-year-old patient lying in the bed with no acute distress.  EYES: Pupils equal, round, reactive to light and accommodation. No scleral icterus. Extraocular muscles intact.  HEENT: Head atraumatic, normocephalic. Oropharynx and nasopharynx clear.  NECK:  Supple, no jugular venous distention. No thyroid enlargement, no tenderness.  LUNGS: decreased breath sounds bilaterally, no wheezing, rales, rhonchi. CARDIOVASCULAR: S1, S2 normal. No murmurs, rubs, or gallops.  ABDOMEN: Soft, nontender, nondistended. Bowel sounds present. No  organomegaly or mass.  EXTREMITIES: No cyanosis, clubbing or edema b/l.    NEUROLOGIC: Cranial nerves II through XII are intact. No focal Motor or sensory deficits b/l.   PSYCHIATRIC:  patient is lethargic SKIN: No obvious rash, lesion, or ulcer.   LABORATORY PANEL:  CBC Recent Labs  Lab 07/20/17 0425  WBC 15.5*  HGB 9.7*  HCT 31.4*  PLT 200    Chemistries  Recent Labs  Lab 07/19/17 0316 07/20/17 0425  NA 145 148*  K 3.0* 4.4  CL 94* 94*  CO2 41* 48*  GLUCOSE 92 132*  BUN 9 7  CREATININE <0.30* <0.30*  CALCIUM 9.1 9.3  MG 1.2*  --   AST 20  --   ALT 14  --   ALKPHOS 40  --   BILITOT 1.0  --    Cardiac Enzymes No results for input(s): TROPONINI in the last 168 hours. RADIOLOGY:  Dg Chest Port 1 View  Result Date: 07/19/2017 CLINICAL DATA:  Respiratory failure EXAM: PORTABLE CHEST 1 VIEW COMPARISON:  July 15, 2017 FINDINGS: There are small pleural effusions bilaterally. There is consolidation in the left lower lobe. Lungs elsewhere are clear. Heart size is upper normal with pulmonary vascularity within normal limits. No adenopathy. There is degenerative change in the thoracic spine. IMPRESSION: Left lower lobe consolidation with small pleural effusions bilaterally. There is mild bibasilar atelectasis. Stable cardiac silhouette. Electronically Signed   By: Lowella Grip III M.D.   On: 07/19/2017 07:57   ASSESSMENT AND PLAN:  Kayla Boyer  is a 62 y.o. female with a known history of gastrointestinal bleeding, gastric ulcer, hyperlipidemia, emphysema of lung was referred by ER physician Dr.malinda for weakness and dark stools.  Patient presented to the  emergency room with generalized weakness.  She has noticed a dark black stools for the last few days   * Acute on chronic hypoxic respiratory failure secondary to COPD exacerbation. -requiring prn Bipap - she's very lethargic at times  * Hypernatreamia/hypokalemia - started on D5 and getting K repleted   * acute on  chornic gastrointestinal bleeding. - s/p EGD 3/18 showing Normal duodenal bulb and second portion of the duodenum. Duodenal lipoma. Non-bleeding gastric ulcer with a clean ulcer base (Forrest Class III).Normal gastroesophageal junction and esophagus. - protonix 40 mg IV bid - hemoglobin 9.7--8.0-- 7.9 --- 7.2 --- 7.1 --6.8 1 unit BT today--9.7 -Given rectal bleed discussed with Dr. Vicente Males will get GI bleeding scan  *Symptomatic anemia with weakness PRBC transfusion as needed     Case discussed with Care Management/Social Worker. Management plans discussed with the patient, Dr Alva Garnet, family at bedside and they are in agreement.  CODE STATUS: Full  DVT Prophylaxis: scd  TOTAL TIME TAKING CARE OF THIS PATIENT: 25 minutes.  >50% time spent on counselling and coordination of care  POSSIBLE D/C IN 2-3 DAYS, DEPENDING ON CLINICAL CONDITION.  Note: This dictation was prepared with Dragon dictation along with smaller phrase technology. Any transcriptional errors that result from this process are unintentional.  Fritzi Mandes M.D on 07/20/2017 at 2:02 PM  Between 7am to 6pm - Pager - [phone]  After 6pm go to [email or web address] - password [email or web address]  [facility]  Office  [phone]  CC: Primary care physician; Patient, No Pcp PerPatient ID: Kayla Boyer, female   DOB: March 20, 1956, 62 y.o.   MRN: [id number]

## 2017-07-20 NOTE — Progress Notes (Signed)
Name: Kayla Boyer MRN: [id number] DOB: Oct 22, 1955    ADMISSION DATE:  07/14/2017  BRIEF PATIENT DESCRIPTION:  17 F hx COPD with recent hosp for GIB due to ulcers presenting with continued drop in Hgb 11 to 9.0.  Pt admitted to stepdown unit 03/15 with acute on chronic hypercapnic hypoxic respiratory failure and acute encephalopathy secondary to AECOPD requiring Bipap.     SIGNIFICANT EVENTS/STUDIES  03/15-Pt admitted to stepdown unit on with acute blood loss anemia, acute encephalopathy, and acute on chronic hypoxic hypercapnic respiratory failure on Bipap  03/17-Pt transferred to [facility] unit 03/18-Pt underwent EGD results revealed normal duodenal bulb and second portion of the  duodenum, duodenal lipoma, non-bleeding gastric ulcer with a clean ulcer base, and normal gastroesophageal junction and esophagus. Rapid response called pt developed acute encephalopathy secondary to acute on chronic hypercapnic respiratory failure likely secondary to sedating medications received during EGD requiring Bipap.  03/19 still requiring intermittent BiPAP 03/20 more awake and alert.  Cognition intact.  Still requiring intermittent BiPAP 03/20 TSH:  0.345 (0.350 - 4.50), free T4: 1.20 (0.61 - 1.12), free T3: 1.3 (2.0 - 4.4) 03/21 still requiring intermittent BiPAP for apparent hypoventilation with lethargy. Continues to have hematochezia without hemodynamic instability    SUBJECTIVE:  Continues to get intermittent BiPAP for lethargy.  She perks up after 30-60 minutes on BiPAP.   VITAL SIGNS: Temp:  [97.6 F (36.4 C)-98.1 F (36.7 C)] 98 F (36.7 C) (03/21 0800) Pulse Rate:  [96-111] 103 (03/21 1000) Resp:  [20-42] 29 (03/21 1000) BP: (108-146)/(42-90) 111/73 (03/21 1000) SpO2:  [90 %-100 %] 94 % (03/21 1000)  PHYSICAL EXAMINATION: Chronically ill-appearing, no respiratory distress HEENT: NCAT, sclerae white JVP cannot be visualized Diffusely diminished breath sounds, no wheezes or other  adventitious sounds Heart sounds, regular, no M Obese, soft, nontender Extremities warm without edema Cranial nerves intact, moves all extremities  Recent Labs  Lab 07/18/17 0755 07/19/17 0316 07/20/17 0425  NA 151* 145 148*  K 2.6* 3.0* 4.4  CL 98* 94* 94*  CO2 37* 41* 48*  BUN 10 9 7   CREATININE 0.50 <0.30* <0.30*  GLUCOSE 80 92 132*   Recent Labs  Lab 07/18/17 1722 07/19/17 0316 07/20/17 0425  HGB 7.1* 6.8* 9.7*  HCT 24.2* 22.6* 31.4*  WBC 21.2* 18.7* 15.5*  PLT 176 165 200   CXR: Mild bibasilar atelectasis with very small bilateral effusions  ASSESSMENT / PLAN: UGIB with acute blood loss anemia  Acute on chronic hypercapnic respiratory failure due primarily to impaired ventilatory drive - Although TSH and free T4 are normal, free T3 is low - "Low T3 syndrome" Acute encephalopathy secondary to hypercapnia, recurrent  Hypernatremia Hypokalemia, resolved  Discussion: I remain puzzled why she has chronic alveolar hypoventilation. This appears to be a problem of impaired ventilatory drive. Doubt COPD. Might be a component of OHS but I don't think that fully accounts for her ventilatory problem. 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 This could be due to euthyroid-sick syndrome. Nonetheless, the potential benefit of (cautious) T4/T3 repletion seems to outweigh the risks.   P: DVT px: SCDs Monitor CBC intermittently Monitor for s/sx of bleeding  GI Service following NM bleeding scan ordered 03/21 Continue PPI  Continue BiPAP as needed for lethargy Continue mandatory nocturnal BiPAP Continue supplemental oxygen carefully titrated to SpO2 goal of 88-94% Continue nebulized steroids and bronchodilators Initiate T4/T3 (armour) therapy   Increase D5W plus potassium infusion  Needs to remain in SDU until she  can remain off of BiPAP for a whole day    Merton Border, MD PCCM service Mobile [phone] Pager [phone] 07/20/2017 11:43 AM

## 2017-07-20 NOTE — Progress Notes (Signed)
   07/20/17 1040  Clinical Encounter Type  Visited With Patient not available  Visit Type Follow-up  Spiritual Encounters  Spiritual Needs Prayer   Patient appeared to be sleeping; chaplain offered silent prayers for patient, family, and care team.

## 2017-07-21 DIAGNOSIS — E870 Hyperosmolality and hypernatremia: Secondary | ICD-10-CM

## 2017-07-21 DIAGNOSIS — Z515 Encounter for palliative care: Secondary | ICD-10-CM

## 2017-07-21 DIAGNOSIS — K921 Melena: Principal | ICD-10-CM

## 2017-07-21 DIAGNOSIS — J9602 Acute respiratory failure with hypercapnia: Secondary | ICD-10-CM

## 2017-07-21 DIAGNOSIS — J9600 Acute respiratory failure, unspecified whether with hypoxia or hypercapnia: Secondary | ICD-10-CM

## 2017-07-21 DIAGNOSIS — D500 Iron deficiency anemia secondary to blood loss (chronic): Secondary | ICD-10-CM

## 2017-07-21 DIAGNOSIS — Z7189 Other specified counseling: Secondary | ICD-10-CM

## 2017-07-21 LAB — CBC
HEMATOCRIT: 27.9 % — AB (ref 35.0–47.0)
Hemoglobin: 8.7 g/dL — ABNORMAL LOW (ref 12.0–16.0)
MCH: 28.3 pg (ref 26.0–34.0)
MCHC: 31.1 g/dL — AB (ref 32.0–36.0)
MCV: 91.2 fL (ref 80.0–100.0)
PLATELETS: 218 10*3/uL (ref 150–440)
RBC: 3.06 MIL/uL — ABNORMAL LOW (ref 3.80–5.20)
RDW: 17.7 % — AB (ref 11.5–14.5)
WBC: 12 10*3/uL — AB (ref 3.6–11.0)

## 2017-07-21 MED ORDER — SENNOSIDES-DOCUSATE SODIUM 8.6-50 MG PO TABS
1.0000 | ORAL_TABLET | Freq: Two times a day (BID) | ORAL | Status: DC
  Administered 2017-07-21 – 2017-07-24 (×5): 1 via ORAL
  Filled 2017-07-21 (×5): qty 1

## 2017-07-21 NOTE — Progress Notes (Signed)
OT Cancellation Note  Patient Details Name: Darienne Belleau MRN: [id number] DOB: 11-17-1955   Cancelled Treatment:    Reason Eval/Treat Not Completed: Medical issues which prohibited therapy. On attempt to evaluate this am, other OT noting pt back on BiPAP and not appropriate for therapy at this time. Will re-attempt next date.  Jeni Salles, MPH, MS, OTR/L ascom [phone] 07/21/17, 1:53 PM

## 2017-07-21 NOTE — Progress Notes (Signed)
[facility] NAME: Kayla Boyer    MR#:  [id number]  DATE OF BIRTH:  May 11, 1955  SUBJECTIVE:  Appears to be a poor historian No further rectal bleed.  Son in the room.  Patient is talking to palliative care.  She is quite teary.  She does not want to eat her food and wants her to be placed back on BiPAP. REVIEW OF SYSTEMS:   Review of Systems  Constitutional: Negative for chills, fever and weight loss.  HENT: Negative for ear discharge, ear pain and nosebleeds.   Eyes: Negative for blurred vision, pain and discharge.  Respiratory: Negative for sputum production, shortness of breath, wheezing and stridor.   Cardiovascular: Negative for chest pain, palpitations, orthopnea and PND.  Gastrointestinal: Positive for melena. Negative for abdominal pain, diarrhea, nausea and vomiting.  Genitourinary: Negative for frequency and urgency.  Musculoskeletal: Negative for back pain and joint pain.  Neurological: Positive for weakness. Negative for sensory change, speech change and focal weakness.  Psychiatric/Behavioral: Negative for depression and hallucinations. The patient is not nervous/anxious.    Tolerating Diet: soft Tolerating PT: pending  DRUG ALLERGIES:  No Known Allergies  VITALS:  Blood pressure (!) 146/84, pulse 73, temperature 97.8 F (36.6 C), temperature source Axillary, resp. rate 19, height 5' 2.5" (1.588 m), weight 59.9 kg (132 lb 0.9 oz), SpO2 100 %.  PHYSICAL EXAMINATION:   Physical Exam  GENERAL:  62 y.o.-year-old patient lying in the bed with no acute distress.  Appears chronically ill EYES: Pupils equal, round, reactive to light and accommodation. No scleral icterus. Extraocular muscles intact.  HEENT: Head atraumatic, normocephalic. Oropharynx and nasopharynx clear.  NECK:  Supple, no jugular venous distention. No thyroid enlargement, no tenderness.  LUNGS: decreased breath sounds bilaterally, no wheezing,  rales, rhonchi. CARDIOVASCULAR: S1, S2 normal. No murmurs, rubs, or gallops.  ABDOMEN: Soft, nontender, nondistended. Bowel sounds present. No organomegaly or mass.  EXTREMITIES: No cyanosis, clubbing or edema b/l.    NEUROLOGIC: Cranial nerves II through XII are intact. No focal Motor or sensory deficits b/l.   PSYCHIATRIC:  patient is lethargic SKIN: No obvious rash, lesion, or ulcer.   LABORATORY PANEL:  CBC Recent Labs  Lab 07/21/17 0717  WBC 12.0*  HGB 8.7*  HCT 27.9*  PLT 218    Chemistries  Recent Labs  Lab 07/19/17 0316 07/20/17 0425  NA 145 148*  K 3.0* 4.4  CL 94* 94*  CO2 41* 48*  GLUCOSE 92 132*  BUN 9 7  CREATININE <0.30* <0.30*  CALCIUM 9.1 9.3  MG 1.2*  --   AST 20  --   ALT 14  --   ALKPHOS 40  --   BILITOT 1.0  --    Cardiac Enzymes No results for input(s): TROPONINI in the last 168 hours. RADIOLOGY:  Nm Gi Blood Loss  Result Date: 07/20/2017 CLINICAL DATA:  GI bleeding EXAM: NUCLEAR MEDICINE GASTROINTESTINAL BLEEDING SCAN TECHNIQUE: Sequential abdominal images were obtained following intravenous administration of Tc-97mlabeled red blood cells. RADIOPHARMACEUTICALS:  Twenty mCi Tc-964mertechnetate in-vitro labeled red cells. COMPARISON:  None. FINDINGS: Normal distribution of radiotracer is noted. No focal area of tracer accumulation is identified to suggest active GI bleeding is noted. IMPRESSION: No findings to suggest active GI hemorrhage. Electronically Signed   By: MaInez Catalina.D.   On: 07/20/2017 15:16   ASSESSMENT AND PLAN:  RoMikael Debellis a [age].o. female with a known history of  gastrointestinal bleeding, gastric ulcer, hyperlipidemia, emphysema of lung was referred by ER physician Dr.malinda for weakness and dark stools.  Patient presented to the emergency room with generalized weakness.  She has noticed a dark black stools for the last few days   * Acute on chronic hypoxic respiratory failure secondary to COPD exacerbation. -Patient has  chronic alveolar hypoventilation appears to be due to impaired ventilatory drive -requiring prn Bipap - she's very lethargic at times  * Hypernatreamia/hypokalemia - started on D5 and getting K repleted   * acute on chornic gastrointestinal bleeding. - s/p EGD 3/18 showing Normal duodenal bulb and second portion of the duodenum. Duodenal lipoma. Non-bleeding gastric ulcer with a clean ulcer base (For[place] Class III).Normal gastroesophageal junction and esophagus. - protonix 40 mg IV bid - hemoglobin 9.7--8.0-- 7.9 --- 7.2 --- 7.1 --6.8 1 unit BT today--9.7--8.7 -Given rectal bleed discussed with Dr. Barbaraann Share scan negative  *Symptomatic anemia with weakness PRBC transfusion as needed  *Palliative care met with patient and son.  For now patient is a full code.  Case discussed with Care Management/Social Worker. Management plans discussed with the patient, Dr Alva Garnet, family at bedside and they are in agreement.  CODE STATUS: Full  DVT Prophylaxis: scd  TOTAL TIME TAKING CARE OF THIS PATIENT: 25 minutes.  >50% time spent on counselling and coordination of care  POSSIBLE D/C IN few  DAYS, DEPENDING ON CLINICAL CONDITION.  Note: This dictation was prepared with Dragon dictation along with smaller phrase technology. Any transcriptional errors that result from this process are unintentional.  Fritzi Mandes M.D on 07/21/2017 at 2:31 PM  Between 7am to 6pm - Pager - [phone]  After 6pm go to [email or web address] - password [email or web address]  [facility]  Office  [phone]  CC: Primary care physician; Patient, No Pcp PerPatient ID: Kayla Boyer, female   DOB: [birth year], 62 y.o.   MRN: [id number]

## 2017-07-21 NOTE — Progress Notes (Signed)
Jonathon Bellows , MD [address], [place], [place], [place], [address] [address], [place], [place], [place], [address] Phone: [phone]  Fax: [phone]   Jenae Tomasello is being followed for GI bleed  Subjective: Still requiring bipap, feels short of breath at rest    Objective: Vital signs in last 24 hours: Vitals:   07/21/17 0700 07/21/17 0740 07/21/17 0800 07/21/17 0825  BP: (!) 141/71  (!) 146/82   Pulse: 88 95 (!) 101 (!) 111  Resp: (!) 24 (!) 26 (!) 29 (!) 35  Temp:  (!) 97.5 F (36.4 C)    TempSrc:  Oral    SpO2: 99% 100% 100% 98%  Weight:      Height:       Weight change:   Intake/Output Summary (Last 24 hours) at 07/21/2017 1129 Last data filed at 07/21/2017 0700 Gross per 24 hour  Intake 887.5 ml  Output 750 ml  Net 137.5 ml     Exam: Heart:: Regular rate and rhythm, S1S2 present or without murmur or extra heart sounds Lungs: decreased air entery b/l no added breath sounds  Abdomen: soft, nontender, normal bowel sounds   Lab Results: @LABTEST2 @ Micro Results: Recent Results (from the past 240 hour(s))  MRSA PCR Screening     Status: None   Collection Time: 07/15/17  2:07 AM  Result Value Ref Range Status   MRSA by PCR NEGATIVE NEGATIVE Final    Comment:        The GeneXpert MRSA Assay (FDA approved for NASAL specimens only), is one component of a comprehensive MRSA colonization surveillance program. It is not intended to diagnose MRSA infection nor to guide or monitor treatment for MRSA infections. Performed at [facility], [address] [place], [place] [address]    Studies/Results: Nm Gi Blood Loss  Result Date: 07/20/2017 CLINICAL DATA:  GI bleeding EXAM: NUCLEAR MEDICINE GASTROINTESTINAL BLEEDING SCAN TECHNIQUE: Sequential abdominal images were obtained following intravenous administration of Tc-35m labeled red blood cells. RADIOPHARMACEUTICALS:  Twenty mCi Tc-39m pertechnetate in-vitro labeled red cells.  COMPARISON:  None. FINDINGS: Normal distribution of radiotracer is noted. No focal area of tracer accumulation is identified to suggest active GI bleeding is noted. IMPRESSION: No findings to suggest active GI hemorrhage. Electronically Signed   By: Inez Catalina M.D.   On: 07/20/2017 15:16   Medications: I have reviewed the patient's current medications. Scheduled Meds: . feeding supplement (ENSURE ENLIVE)  237 mL Oral TID BM  . ipratropium-albuterol  3 mL Nebulization Q6H  . mouth rinse  15 mL Mouth Rinse BID  . pantoprazole  40 mg Oral BID AC  . senna-docusate  1 tablet Oral BID  . thyroid  30 mg Oral QAC breakfast   Continuous Infusions: . dextrose 50 mL/hr at 07/21/17 0752   PRN Meds:.acetaminophen **OR** acetaminophen, metoprolol tartrate, [DISCONTINUED] ondansetron **OR** ondansetron (ZOFRAN) IV   Assessment: Active Problems:   GI bleed   Chronic gastric ulcer without hemorrhage and without perforation  Jeryl Fesperman 62 y.o. female with history , iron deficiency anemia . Admitted to ICU for respiratory failure on 07/15/17 , required BIPAP. EGD  06/2017 non bleeding gastric ulcer treated with cautery . 07/17/17 - EGD repeated -non bleeding gastric ulcer seen. Hb has been gradually trending down . An acute gastric ulcer should not commonly  cause iron deficiency anemia . She may have two issues going on- acutely the bleeding ulcer but also a chronic bleed which may be either small bowel or  colon. At present her breathing is too poor to undergo any procedures . Tagged RBC scan on 07/20/17 negative   Plan: 1.Folate low- replace 2. F/u H pylori stool antigen   Dr Bonna Gains will be covering over the weekend    LOS: 7 days   Jonathon Bellows, MD 07/21/2017, 11:29 AM

## 2017-07-21 NOTE — Consult Note (Signed)
Consultation Note Date: 07/21/2017   Patient Name: Kayla Boyer  DOB: [birth year]  MRN: [id number]  Age / Sex: 62 y.o., female  PCP: Patient, [name] Referring Physician: Fritzi Mandes, MD  Reason for Consultation: Establishing goals of care  HPI/Patient Profile: 62 y.o. female  with past medical history of recent admission for GIB gastric ulcers, HLD, COPD admitted on 07/14/2017 with weakness and dark stools. Hospitalization complicated by continued reliance on BiPAP and impaired ventilation as well as GIB. PCCM questioning "low T3 syndrome." Per notes she has been wheelchair bound for ~3 years and with poor appetite and low functional status.  Clinical Assessment and Goals of Care: I met today with Kayla Boyer and her son, Kayla Boyer, at bedside. I was told that Kayla Boyer has a husband but that he has a 6 grade education and that her son helps her with decisions.   I spent much time explaining to them that we are very concerned about her breathing dysfunction. Reviewed CXR with Kayla Boyer at his request and explained that nothing on CXR explains her problems breathing. He asks about infection or obstruction and I explained that we do not see any evidence of these being the culprit. He says "so she is fine, there's nothing wrong." I explained that there is something wrong because she cannot breathe but we are just not entirely sure as to why and therefore cannot treat since we don't know for sure what to treat. PCCM giving trial of T4/T3 therapy for potential "low T3 syndrome." With fairly good CXR and good sats (100%) this gives them false reassurance.   I attempted to discuss with them the severity of her condition and concern with her reliance on BiPAP (she has only tolerated ~30 min off BiPAP during my visit). I asked them if about intubation and life support if her breathing were to worsen. Kayla Boyer became very worked  up and kept saying she would be fine. She would not discuss this with me. I spoke more with Kayla Boyer who at this point would like full code. The only information I could get was that she did comment that she would not like prolonged suffering when her sister died of cancer. They would want intubation but not prolonged. I did explain that the BiPAP itself can become a form of life support that is not sustainable and that this too will lead to suffering.   Of other note Kayla Boyer has had some interesting functional decline over the past few years. Son reports wheelchair bound (recently mostly bed bound) after an ankle fracture ~2-3 yrs ago. He also says that she has lost well over 100 lbs in the past ~1 yr (although he did say that she wanted to lose weight). These are drastic symptoms for someone without any known serious/chronic officially diagnosed disease process.   Palliative to f/u Monday.   Primary Decision Maker PATIENT with assistance of son, Kayla Boyer    SUMMARY OF RECOMMENDATIONS   - Full code for now - Reiterated to son that  BiPAP is not sustainable long term and may be a life support and form of suffering at some point if she is unable to wean adequately - F/U Monday  Code Status/Advance Care Planning:  Full code   Symptom Management:   Per PCCM   Palliative Prophylaxis:   Aspiration, Delirium Protocol, Frequent Pain Assessment, Oral Care and Turn Reposition  Additional Recommendations (Limitations, Scope, Preferences):  Full Scope Treatment  Psycho-social/Spiritual:   Desire for further Chaplaincy support:yes  Additional Recommendations: Caregiving  Support/Resources  Prognosis:   Overall prognosis poor with pattern of decline. Short term prognosis dependent on her progress to titrate off BiPAP.   Discharge Planning: To Be Determined      Primary Diagnoses: Present on Admission: . GI bleed   I have reviewed the medical record, interviewed the patient and  family, and examined the patient. The following aspects are pertinent.  Past Medical History:  Diagnosis Date  . Emphysema of lung ([place])   . GI bleed   . HLD (hyperlipidemia)    Social History   Socioeconomic History  . Marital status: Married    Spouse name: Not on file  . Number of children: Not on file  . Years of education: Not on file  . Highest education level: Not on file  Occupational History  . Not on file  Social Needs  . Financial resource strain: Not on file  . Food insecurity:    Worry: Not on file    Inability: Not on file  . Transportation needs:    Medical: Not on file    Non-medical: Not on file  Tobacco Use  . Smoking status: Never Smoker  . Smokeless tobacco: Never Used  Substance and Sexual Activity  . Alcohol use: No    Frequency: Never  . Drug use: No  . Sexual activity: Not on file  Lifestyle  . Physical activity:    Days per week: Not on file    Minutes per session: Not on file  . Stress: Not on file  Relationships  . Social connections:    Talks on phone: Not on file    Gets together: Not on file    Attends religious service: Not on file    Active member of club or organization: Not on file    Attends meetings of clubs or organizations: Not on file    Relationship status: Not on file  Other Topics Concern  . Not on file  Social History Narrative  . Not on file   Family History  Family history unknown: Yes   Scheduled Meds: . feeding supplement (ENSURE ENLIVE)  237 mL Oral TID BM  . ipratropium-albuterol  3 mL Nebulization Q6H  . mouth rinse  15 mL Mouth Rinse BID  . pantoprazole  40 mg Oral BID AC  . thyroid  30 mg Oral QAC breakfast   Continuous Infusions: . dextrose 50 mL/hr at 07/21/17 0752   PRN Meds:.acetaminophen **OR** acetaminophen, metoprolol tartrate, [DISCONTINUED] ondansetron **OR** ondansetron (ZOFRAN) IV No Known Allergies Review of Systems  Unable to perform ROS: Acuity of condition    Physical Exam    Constitutional: She appears well-developed.  HENT:  Head: Normocephalic and atraumatic.  Cardiovascular: Tachycardia present.  Pulmonary/Chest: Accessory muscle usage present. Tachypnea noted. She is in respiratory distress. She has decreased breath sounds.  Abdominal: Soft. Normal appearance.  Neurological: She is alert.  Orientation difficult to discern with soft speech and BiPAP. I do not believe she understands the severity of her  illness.   Nursing note and vitals reviewed.   Vital Signs: BP (!) 146/82   Pulse (!) 111   Temp (!) 97.5 F (36.4 C) (Oral)   Resp (!) 35   Ht 5' 2.5" (1.588 m)   Wt 59.9 kg (132 lb 0.9 oz)   SpO2 98%   BMI 23.77 kg/m  Pain Scale: 0-10 POSS *See Group Information*: 1-Acceptable,Awake and alert Pain Score: 0-No pain   SpO2: SpO2: 98 % O2 Device:SpO2: 98 % O2 Flow Rate: .O2 Flow Rate (L/min): 2 L/min  IO: Intake/output summary:   Intake/Output Summary (Last 24 hours) at 07/21/2017 1042 Last data filed at 07/21/2017 0700 Gross per 24 hour  Intake 887.5 ml  Output 750 ml  Net 137.5 ml    LBM: Last BM Date: (pt unable to state) Baseline Weight: Weight: 60.8 kg (134 lb) Most recent weight: Weight: 59.9 kg (132 lb 0.9 oz)     Palliative Assessment/Data: 20%     Time Total: 70 min  Greater than 50%  of this time was spent counseling and coordinating care related to the above assessment and plan.  Signed by: Vinie Sill, NP Palliative Medicine Team Pager # [phone] (M-F 8a-5p) Team Phone # [phone] (Nights/Weekends)

## 2017-07-21 NOTE — Progress Notes (Signed)
PT Cancellation Note  Patient Details Name: Tymeshia Awan MRN: [id number] DOB: February 01, 1956   Cancelled Treatment:    Reason Eval/Treat Not Completed: Medical issues which prohibited therapy.  Pt back on BiPAP this morning.  Discussed pt with nursing.  Will continue to hold PT until pt's respiratory status improves and pt is medically appropriate to participate in PT.  Leitha Bleak, PT 07/21/17, 8:42 AM [phone]

## 2017-07-21 NOTE — Progress Notes (Signed)
   Name: Kayla Boyer MRN: [id number] DOB: 12-Apr-1956    ADMISSION DATE:  07/14/2017  BRIEF PATIENT DESCRIPTION:  51 F hx COPD with recent hosp for GIB due to ulcers presenting with continued drop in Hgb 11 to 9.0.  Pt admitted to stepdown unit 03/15 with acute on chronic hypercapnic hypoxic respiratory failure and acute encephalopathy secondary to AECOPD requiring Bipap.     SIGNIFICANT EVENTS/STUDIES  03/15-Pt admitted to stepdown unit on with acute blood loss anemia, acute encephalopathy, and acute on chronic hypoxic hypercapnic respiratory failure on Bipap  03/17-Pt transferred to [facility] unit 03/18-Pt underwent EGD results revealed normal duodenal bulb and second portion of the  duodenum, duodenal lipoma, non-bleeding gastric ulcer with a clean ulcer base, and normal gastroesophageal junction and esophagus. Rapid response called pt developed acute encephalopathy secondary to acute on chronic hypercapnic respiratory failure likely secondary to sedating medications received during EGD requiring Bipap.  03/19 still requiring intermittent BiPAP 03/20 more awake and alert.  Cognition intact.  Still requiring intermittent BiPAP 03/20 TSH:  0.345 (0.350 - 4.50), free T4: 1.20 (0.61 - 1.12), free T3: 1.3 (2.0 - 4.4) 03/21 still requiring intermittent BiPAP for apparent hypoventilation with lethargy. Continues to have hematochezia without hemodynamic instability    SUBJECTIVE:  Continues to get intermittent BiPAP for lethargy.  She perks up after 30-60 minutes on BiPAP.   VITAL SIGNS: Temp:  [97.5 F (36.4 C)-98.4 F (36.9 C)] 97.5 F (36.4 C) (03/22 0740) Pulse Rate:  [86-124] 111 (03/22 0825) Resp:  [18-37] 35 (03/22 0825) BP: (108-146)/(63-96) 146/82 (03/22 0800) SpO2:  [87 %-100 %] 98 % (03/22 0825) FiO2 (%):  [24 %-28 %] 24 % (03/22 0133)  PHYSICAL EXAMINATION: Chronically ill-appearing, no respiratory distress HEENT: NCAT, sclerae white JVP cannot be visualized Diffusely  diminished breath sounds, no wheezes or other adventitious sounds Heart sounds, regular, no Murmurs Obese, soft, nontender Extremities warm with 1+edema Cranial nerves intact, moves all extremities  Recent Labs  Lab 07/18/17 0755 07/19/17 0316 07/20/17 0425  NA 151* 145 148*  K 2.6* 3.0* 4.4  CL 98* 94* 94*  CO2 37* 41* 48*  BUN 10 9 7   CREATININE 0.50 <0.30* <0.30*  GLUCOSE 80 92 132*   Recent Labs  Lab 07/19/17 0316 07/20/17 0425 07/21/17 0717  HGB 6.8* 9.7* 8.7*  HCT 22.6* 31.4* 27.9*  WBC 18.7* 15.5* 12.0*  PLT 165 200 218   CXR: Mild bibasilar atelectasis with very small bilateral effusions  ASSESSMENT / PLAN: 1. UGIB with acute blood loss anemia with no obvious source identified by bleeding scan. 2. Acute on chronic hypercapnic respiratory failure due primarily to impaired ventilatory drive - Although TSH and free T4 are normal, free T3 is low - "Low T3 syndrome"/ Sick Eurthyroid 3. Acute encephalopathy secondary to hypercapnia, recurrent 4. Hypernatremia azotemia- improving 5. Hypokalemia, resolved   Plan;  1. Transfuse for Hg < 7, continue on PPI 2. Continue on NIPPV and Oxygen to keep SpO2 88-94% 3. Start low volume IVF as patient taking inadequate intake 4. Family meeting with palliative care team- continue as full code 5. Continue nebulized steroids and bronchodilators    Needs to remain in SDU until she can remain off of BiPAP for a whole day    Cammie Sickle, MD PCCM service Pager [phone] 07/21/2017 11:34 AM

## 2017-07-22 ENCOUNTER — Inpatient Hospital Stay: Payer: Medicaid Other

## 2017-07-22 DIAGNOSIS — E46 Unspecified protein-calorie malnutrition: Secondary | ICD-10-CM

## 2017-07-22 DIAGNOSIS — K257 Chronic gastric ulcer without hemorrhage or perforation: Secondary | ICD-10-CM

## 2017-07-22 LAB — PHOSPHORUS: Phosphorus: <1 mg/dL — CL (ref 2.5–4.6)

## 2017-07-22 LAB — BLOOD GAS, ARTERIAL
Acid-Base Excess: 30 mmol/L — ABNORMAL HIGH (ref 0.0–2.0)
BICARBONATE: 56.4 mmol/L — AB (ref 20.0–28.0)
Delivery systems: POSITIVE
Expiratory PAP: 8
FIO2: 24
INSPIRATORY PAP: 18
O2 SAT: 97.1 %
PO2 ART: 81 mmHg — AB (ref 83.0–108.0)
Patient temperature: 37
pCO2 arterial: 66 mmHg (ref 32.0–48.0)
pH, Arterial: 7.54 — ABNORMAL HIGH (ref 7.350–7.450)

## 2017-07-22 LAB — BASIC METABOLIC PANEL
Anion gap: 9 (ref 5–15)
BUN: 9 mg/dL (ref 6–20)
CO2: 42 mmol/L — AB (ref 22–32)
Calcium: 8.9 mg/dL (ref 8.9–10.3)
Chloride: 93 mmol/L — ABNORMAL LOW (ref 101–111)
Creatinine, Ser: <0.3 mg/dL — ABNORMAL LOW (ref 0.44–1.00)
GLUCOSE: 92 mg/dL (ref 65–99)
Potassium: 4.5 mmol/L (ref 3.5–5.1)
Sodium: 144 mmol/L (ref 135–145)

## 2017-07-22 LAB — GLUCOSE, CAPILLARY: GLUCOSE-CAPILLARY: 177 mg/dL — AB (ref 65–99)

## 2017-07-22 LAB — MAGNESIUM: Magnesium: 1.7 mg/dL (ref 1.7–2.4)

## 2017-07-22 MED ORDER — BISACODYL 10 MG RE SUPP
10.0000 mg | Freq: Every day | RECTAL | Status: DC | PRN
  Administered 2017-07-22 – 2017-08-03 (×3): 10 mg via RECTAL
  Filled 2017-07-22 (×3): qty 1

## 2017-07-22 MED ORDER — K PHOS MONO-SOD PHOS DI & MONO 155-852-130 MG PO TABS
1000.0000 mg | ORAL_TABLET | Freq: Two times a day (BID) | ORAL | Status: AC
  Administered 2017-07-22 (×2): 1000 mg via ORAL
  Filled 2017-07-22 (×2): qty 4

## 2017-07-22 MED ORDER — ALPRAZOLAM 0.25 MG PO TABS
0.2500 mg | ORAL_TABLET | Freq: Three times a day (TID) | ORAL | Status: DC | PRN
  Administered 2017-07-22 – 2017-07-24 (×6): 0.25 mg via ORAL
  Filled 2017-07-22 (×6): qty 1

## 2017-07-22 MED ORDER — ORAL CARE MOUTH RINSE
15.0000 mL | Freq: Two times a day (BID) | OROMUCOSAL | Status: DC
  Administered 2017-07-22 – 2017-08-01 (×19): 15 mL via OROMUCOSAL

## 2017-07-22 MED ORDER — VITAL HIGH PROTEIN PO LIQD: 1000.0000 mL | ORAL | Status: DC

## 2017-07-22 MED ORDER — CHLORHEXIDINE GLUCONATE 0.12 % MT SOLN
15.0000 mL | Freq: Two times a day (BID) | OROMUCOSAL | Status: DC
  Administered 2017-07-22 – 2017-08-17 (×48): 15 mL via OROMUCOSAL
  Filled 2017-07-22 (×32): qty 15

## 2017-07-22 MED ORDER — VITAL HIGH PROTEIN PO LIQD
1000.0000 mL | ORAL | Status: DC
  Administered 2017-07-22 – 2017-07-23 (×2): 1000 mL

## 2017-07-22 NOTE — Progress Notes (Signed)
Name: Kayla Boyer MRN: [id number] DOB: [birth year]    ADMISSION DATE:  07/14/2017  BRIEF PATIENT DESCRIPTION:  16 F hx COPD with recent hosp for GIB due to ulcers presenting with continued drop in Hgb 11 to 9.0.  Pt admitted to stepdown unit 03/15 with acute on chronic hypercapnic hypoxic respiratory failure and acute encephalopathy secondary to AECOPD requiring Bipap.     SIGNIFICANT EVENTS/STUDIES  03/15-Pt admitted to stepdown unit on with acute blood loss anemia, acute encephalopathy, and acute on chronic hypoxic hypercapnic respiratory failure on Bipap  03/17-Pt transferred to [facility] unit 03/18-Pt underwent EGD results revealed normal duodenal bulb and second portion of the  duodenum, duodenal lipoma, non-bleeding gastric ulcer with a clean ulcer base, and normal gastroesophageal junction and esophagus. Rapid response called pt developed acute encephalopathy secondary to acute on chronic hypercapnic respiratory failure likely secondary to sedating medications received during EGD requiring Bipap.  03/19 still requiring intermittent BiPAP 03/20 more awake and alert.  Cognition intact.  Still requiring intermittent BiPAP 03/20 TSH:  0.345 (0.350 - 4.50), free T4: 1.20 (0.61 - 1.12), free T3: 1.3 (2.0 - 4.4) 03/21 still requiring intermittent BiPAP for apparent hypoventilation with lethargy. Continues to have hematochezia without hemodynamic instability    SUBJECTIVE:  Continues to get intermittent BiPAP for lethargy.  She is unable to stay off BIPAP for more than 30 minutes to tolerate adequate nutrition. This morning she reported anxiety being off BIPAP   VITAL SIGNS: Temp:  [97.7 F (36.5 C)-98.3 F (36.8 C)] 98.2 F (36.8 C) (03/23 0748) Pulse Rate:  [73-129] 96 (03/23 1319) Resp:  [19-36] 26 (03/23 1319) BP: (100-164)/(58-101) 100/58 (03/23 1300) SpO2:  [89 %-100 %] 100 % (03/23 1319) FiO2 (%):  [24 %] 24 % (03/23 1344)  PHYSICAL EXAMINATION: Chronically ill-appearing, no  respiratory distress HEENT: NCAT, sclerae white JVP cannot be visualized Diffusely diminished breath sounds, no wheezes or other adventitious sounds Heart sounds, regular, no Murmurs Obese, soft, nontender Extremities warm with 1+edema Cranial nerves intact but very deaf, generalized weakness  Recent Labs  Lab 07/19/17 0316 07/20/17 0425 07/22/17 0527  NA 145 148* 144  K 3.0* 4.4 4.5  CL 94* 94* 93*  CO2 41* 48* 42*  BUN 9 7 9   CREATININE <0.30* <0.30* <0.30*  GLUCOSE 92 132* 92   Recent Labs  Lab 07/19/17 0316 07/20/17 0425 07/21/17 0717  HGB 6.8* 9.7* 8.7*  HCT 22.6* 31.4* 27.9*  WBC 18.7* 15.5* 12.0*  PLT 165 200 218   CXR: Mild bibasilar atelectasis with very small bilateral effusions  ASSESSMENT / PLAN: 1. UGIB with acute blood loss anemia with no obvious source identified by bleeding scan.  2. Acute on chronic hypercapnic respiratory failure due primarily to impaired ventilatory drive and now anxiety - Although TSH and free T4 are normal, free T3 is low - "Low T3 syndrome"/ Sick Eurthyroid 3. Acute encephalopathy secondary to hypercapnia improving 4. Hypernatremia azotemia- has resolved 5. Hypokalemia, resolved 6. Hypophosphatemia 7. Malnutrition   Plan;  1. Transfuse for Hg < 7, continue on PPI 2. Continue on NIPPV and Oxygen to keep SpO2 88-94% 3. Will insert feeding tube for enteral nutrition since patient has not been able to stay off BIPAP long enough to tolerate nutrition. Replace Phos, cautions with re-feeding syndrome. 4. Family meeting with palliative care team- continue as full code 5. Continue nebulized steroids and bronchodilators  Family:  I have updated the patient's son.  Needs to remain in SDU until she can remain  off of BiPAP for a whole day    Cammie Sickle, MD PCCM service Pager [phone] 07/22/2017 2:11 PM

## 2017-07-22 NOTE — Progress Notes (Signed)
Kayla Boyer    MR#:  [id number]  DATE OF BIRTH:  1955/09/28  SUBJECTIVE:  Appears to be a poor historian No further rectal bleed.  Patient was back and forth on BiPAP 3 times today and during my examination she is on BiPAP.  Very anxious REVIEW OF SYSTEMS:   Review of Systems  Constitutional: Negative for chills, fever and weight loss.  HENT: Negative for ear discharge, ear pain and nosebleeds.   Eyes: Negative for blurred vision, pain and discharge.  Respiratory: Negative for sputum production, shortness of breath, wheezing and stridor.   Cardiovascular: Negative for chest pain, palpitations, orthopnea and PND.  Gastrointestinal: Negative for abdominal pain, diarrhea, melena, nausea and vomiting.  Genitourinary: Negative for frequency and urgency.  Musculoskeletal: Negative for back pain and joint pain.  Neurological: Positive for weakness. Negative for sensory change, speech change and focal weakness.  Psychiatric/Behavioral: Negative for depression and hallucinations. The patient is not nervous/anxious.    Tolerating Diet: soft Tolerating PT: pending  DRUG ALLERGIES:  No Known Allergies  VITALS:  Blood pressure (!) 100/58, pulse 96, temperature 98.2 F (36.8 C), temperature source Axillary, resp. rate (!) 26, height 5' 2.5" (1.588 m), weight 59.9 kg (132 lb 0.9 oz), SpO2 100 %.  PHYSICAL EXAMINATION:   Physical Exam  GENERAL:  62 y.o.-year-old patient lying in the bed with no acute distress.  Appears chronically ill EYES: Pupils equal, round, reactive to light and accommodation. No scleral icterus. Extraocular muscles intact.  HEENT: Head atraumatic, normocephalic. Oropharynx and nasopharynx clear.  NECK:  Supple, no jugular venous distention. No thyroid enlargement, no tenderness.  LUNGS: decreased breath sounds bilaterally, no wheezing, rales, rhonchi. CARDIOVASCULAR: S1, S2 normal. No murmurs,  rubs, or gallops.  ABDOMEN: Soft, nontender, nondistended. Bowel sounds present. No organomegaly or mass.  EXTREMITIES: No cyanosis, clubbing or edema b/l.    NEUROLOGIC: Cranial nerves II through XII are intact. No focal Motor or sensory deficits b/l.   PSYCHIATRIC:  patient is lethargic SKIN: No obvious rash, lesion, or ulcer.   LABORATORY PANEL:  CBC Recent Labs  Lab 07/21/17 0717  WBC 12.0*  HGB 8.7*  HCT 27.9*  PLT 218    Chemistries  Recent Labs  Lab 07/19/17 0316  07/22/17 0527  NA 145   < > 144  K 3.0*   < > 4.5  CL 94*   < > 93*  CO2 41*   < > 42*  GLUCOSE 92   < > 92  BUN 9   < > 9  CREATININE <0.30*   < > <0.30*  CALCIUM 9.1   < > 8.9  MG 1.2*  --  1.7  AST 20  --   --   ALT 14  --   --   ALKPHOS 40  --   --   BILITOT 1.0  --   --    < > = values in this interval not displayed.   Cardiac Enzymes No results for input(s): TROPONINI in the last 168 hours. RADIOLOGY:  Nm Gi Blood Loss  Result Date: 07/20/2017 CLINICAL DATA:  GI bleeding EXAM: NUCLEAR MEDICINE GASTROINTESTINAL BLEEDING SCAN TECHNIQUE: Sequential abdominal images were obtained following intravenous administration of Tc-63mlabeled red blood cells. RADIOPHARMACEUTICALS:  Twenty mCi Tc-97mertechnetate in-vitro labeled red cells. COMPARISON:  None. FINDINGS: Normal distribution of radiotracer is noted. No focal area of tracer accumulation is identified to suggest active GI bleeding  is noted. IMPRESSION: No findings to suggest active GI hemorrhage. Electronically Signed   By: Inez Catalina M.D.   On: 07/20/2017 15:16   Dg Chest Port 1 View  Result Date: 07/22/2017 CLINICAL DATA:  Respiratory failure.  COPD. EXAM: PORTABLE CHEST 1 VIEW COMPARISON:  07/19/2017. FINDINGS: The cardiac silhouette remains borderline enlarged. Stable left pleural effusion and left basilar airspace opacity. Small right pleural effusion, decreased, with decreased adjacent right basilar atelectasis. Thoracic spine degenerative  changes. IMPRESSION: 1. Stable left pleural effusion and left lower lobe atelectasis or pneumonia. 2. Decreased right pleural fluid and right basilar atelectasis. Electronically Signed   By: Claudie Revering M.D.   On: 07/22/2017 09:14   ASSESSMENT AND PLAN:  Kayla Boyer  is a 62 y.o. female with a known history of gastrointestinal bleeding, gastric ulcer, hyperlipidemia, emphysema of lung was referred by ER physician Dr.malinda for weakness and dark stools.  Patient presented to the emergency room with generalized weakness.  She has noticed a dark black stools for the last few days   * Acute on chronic hypoxic respiratory failure secondary to COPD exacerbation. -Patient has chronic alveolar hypoventilation appears to be due to impaired ventilatory drive -requiring prn Bipap -  -Might need NG tube feeds as patient is getting malnourished  * Hypernatreamia/hypokalemia -Resolved  * acute on chornic gastrointestinal bleeding. - s/p EGD 3/18 showing Normal duodenal bulb and second portion of the duodenum. Duodenal lipoma. Non-bleeding gastric ulcer with a clean ulcer base (Forrest Class III).Normal gastroesophageal junction and esophagus. - protonix 40 mg IV bid - hemoglobin 9.7--8.0-- 7.9 --- 7.2 --- 7.1 --6.8 1 unit BT today--9.7--8.7 -Given rectal bleed discussed with Dr. Tahiliani--recommending monitoring hemoglobin and transfusion as needed and PPI once patient is stable clinically might consider colonoscopy if continues to have GI bleed  *Symptomatic anemia with weakness PRBC transfusion as needed  *Palliative care met with patient and son.  For now patient is a full code.  Case discussed with Care Management/Social Worker. Management plans discussed with the patient, Dr Alva Garnet, family at bedside and they are in agreement.  CODE STATUS: Full  DVT Prophylaxis: scd  TOTAL TIME TAKING CARE OF THIS PATIENT: 25 minutes.  >50% time spent on counselling and coordination of care  POSSIBLE  D/C IN few  DAYS, DEPENDING ON CLINICAL CONDITION.  Note: This dictation was prepared with Dragon dictation along with smaller phrase technology. Any transcriptional errors that result from this process are unintentional.  Nicholes Mango M.D on 07/22/2017 at 2:18 PM  Between 7am to 6pm - Pager - [phone]  After 6pm go to [email or web address] - password [email or web address]  [facility]  Office  [phone]  CC: Primary care physician; Patient, No Pcp PerPatient ID: Kayla Boyer, female   DOB: [birth year], 62 y.o.   MRN: [id number]

## 2017-07-22 NOTE — Progress Notes (Signed)
OT Cancellation Note  Patient Details Name: Kayla Boyer MRN: [id number] DOB: [birth year]   Cancelled Treatment:    Reason Eval/Treat Not Completed: Medical issues which prohibited therapy   Hold OT this date per nursing secondary to pt continues to require BiPAP.  Will attempt to see pt at a future date as medically appropriate.     Rosalyn Gess OTR/L,CLT 07/22/2017, 11:46 AM

## 2017-07-22 NOTE — Progress Notes (Signed)
Kayla Antigua, MD [address] [place], [place], [place], [address] [address], [place], [place], [place], [address] Phone: [phone]  Fax: [phone]   Subjective: Pt. And her nurse deny ay episodes of GI bleeding. On BiPAP this morning and has abnormal ABG.    Objective: Vital signs in last 24 hours: Vitals:   07/22/17 0800 07/22/17 0817 07/22/17 0841 07/22/17 0900  BP: (!) 145/85   (!) 148/101  Pulse: (!) 104  (!) 109 (!) 105  Resp: (!) 32  (!) 28 19  Temp:      TempSrc:      SpO2: 100% 100% 98% 99%  Weight:      Height:       Weight change:   Intake/Output Summary (Last 24 hours) at 07/22/2017 0954 Last data filed at 07/22/2017 0700 Gross per 24 hour  Intake 1200 ml  Output 1700 ml  Net -500 ml     Exam: Cardiac: +S1, +S2, RRR, No edema Pulm: CTA b/l, Normal Resp Effort Abd: Soft, NT/ND, No HSM Skin: Warm, no rashes Neck: Supple, Trachea midline   Lab Results: Lab Results  Component Value Date   WBC 12.0 (H) 07/21/2017   HGB 8.7 (L) 07/21/2017   HCT 27.9 (L) 07/21/2017   MCV 91.2 07/21/2017   PLT 218 07/21/2017   Micro Results: Recent Results (from the past 240 hour(s))  MRSA PCR Screening     Status: None   Collection Time: 07/15/17  2:07 AM  Result Value Ref Range Status   MRSA by PCR NEGATIVE NEGATIVE Final    Comment:        The GeneXpert MRSA Assay (FDA approved for NASAL specimens only), is one component of a comprehensive MRSA colonization surveillance program. It is not intended to diagnose MRSA infection nor to guide or monitor treatment for MRSA infections. Performed at [facility], [address] [place], [place] [address]    Studies/Results: Nm Gi Blood Loss  Result Date: 07/20/2017 CLINICAL DATA:  GI bleeding EXAM: NUCLEAR MEDICINE GASTROINTESTINAL BLEEDING SCAN TECHNIQUE: Sequential abdominal images were obtained following intravenous administration of Tc-57m labeled red blood cells.  RADIOPHARMACEUTICALS:  Twenty mCi Tc-54m pertechnetate in-vitro labeled red cells. COMPARISON:  None. FINDINGS: Normal distribution of radiotracer is noted. No focal area of tracer accumulation is identified to suggest active GI bleeding is noted. IMPRESSION: No findings to suggest active GI hemorrhage. Electronically Signed   By: Inez Catalina M.D.   On: 07/20/2017 15:16   Dg Chest Port 1 View  Result Date: 07/22/2017 CLINICAL DATA:  Respiratory failure.  COPD. EXAM: PORTABLE CHEST 1 VIEW COMPARISON:  07/19/2017. FINDINGS: The cardiac silhouette remains borderline enlarged. Stable left pleural effusion and left basilar airspace opacity. Small right pleural effusion, decreased, with decreased adjacent right basilar atelectasis. Thoracic spine degenerative changes. IMPRESSION: 1. Stable left pleural effusion and left lower lobe atelectasis or pneumonia. 2. Decreased right pleural fluid and right basilar atelectasis. Electronically Signed   By: Claudie Revering M.D.   On: 07/22/2017 09:14   Medications:  Scheduled Meds: . chlorhexidine  15 mL Mouth Rinse BID  . feeding supplement (ENSURE ENLIVE)  237 mL Oral TID BM  . ipratropium-albuterol  3 mL Nebulization Q6H  . mouth rinse  15 mL Mouth Rinse q12n4p  . pantoprazole  40 mg Oral BID AC  . phosphorus  1,000 mg Oral BID  . senna-docusate  1 tablet Oral BID  . thyroid  30 mg Oral QAC breakfast   Continuous  Infusions: . dextrose 50 mL/hr at 07/22/17 0700   PRN Meds:.acetaminophen **OR** acetaminophen, metoprolol tartrate, [DISCONTINUED] ondansetron **OR** ondansetron (ZOFRAN) IV   Assessment: Active Problems:   GI bleed   Chronic gastric ulcer without hemorrhage and without perforation   Acute respiratory failure (HCC)   Goals of care, counseling/discussion   Palliative care encounter  62 year old female with iron deficiency anemia, and found to have gastric ulcer, status post BiCAP in February 2019, with repeat EGD in on July 17, 2017, showing  nonbleeding gastric ulcer.  Tagged RBC scan on July 20 2017, negative. patient requiring BiPAP due to COPD exacerbation and awaiting medical optimization prior to any endoscopic procedures  Plan: Last hemoglobin was 8.7 Continue serial CBCs and transfuse as needed.  No signs of active GI bleeding present at this time Continue PPI twice daily Family discussing clinical status with palliative care. Once clinical status improves, can consider colonoscopy if continues to have anemia.   LOS: 8 days   Kayla Antigua, MD 07/22/2017, 9:54 AM

## 2017-07-22 NOTE — Progress Notes (Addendum)
Maggie, NP notified of patient's phosphorus level < 1.0, acknowledged; new order to be entered by Burman Nieves, NP.

## 2017-07-22 NOTE — Progress Notes (Signed)
PT Cancellation Note  Patient Details Name: [name] MRN: [id number] DOB: 1956/01/22   Cancelled Treatment:    Reason Eval/Treat Not Completed: Medical issues which prohibited therapy; Hold PT this date per nursing secondary to pt continues to require BiPAP.  Will attempt to see pt at a future date as medically appropriate.    Linus Salmons PT, DPT 07/22/17, 8:56 AM

## 2017-07-23 ENCOUNTER — Inpatient Hospital Stay: Payer: Medicaid Other

## 2017-07-23 DIAGNOSIS — E440 Moderate protein-calorie malnutrition: Secondary | ICD-10-CM

## 2017-07-23 DIAGNOSIS — E0781 Sick-euthyroid syndrome: Secondary | ICD-10-CM

## 2017-07-23 LAB — BASIC METABOLIC PANEL
ANION GAP: 10 (ref 5–15)
BUN: 12 mg/dL (ref 6–20)
CO2: 41 mmol/L — ABNORMAL HIGH (ref 22–32)
Calcium: 8.1 mg/dL — ABNORMAL LOW (ref 8.9–10.3)
Chloride: 89 mmol/L — ABNORMAL LOW (ref 101–111)
Creatinine, Ser: <0.3 mg/dL — ABNORMAL LOW (ref 0.44–1.00)
GLUCOSE: 116 mg/dL — AB (ref 65–99)
POTASSIUM: 4 mmol/L (ref 3.5–5.1)
Sodium: 140 mmol/L (ref 135–145)

## 2017-07-23 LAB — CBC
HEMATOCRIT: 28.9 % — AB (ref 35.0–47.0)
HEMOGLOBIN: 9 g/dL — AB (ref 12.0–16.0)
MCH: 28.5 pg (ref 26.0–34.0)
MCHC: 31 g/dL — ABNORMAL LOW (ref 32.0–36.0)
MCV: 91.8 fL (ref 80.0–100.0)
PLATELETS: 234 10*3/uL (ref 150–440)
RBC: 3.15 MIL/uL — AB (ref 3.80–5.20)
RDW: 16.8 % — ABNORMAL HIGH (ref 11.5–14.5)
WBC: 14.5 10*3/uL — AB (ref 3.6–11.0)

## 2017-07-23 LAB — BLOOD GAS, ARTERIAL
Acid-Base Excess: 31.6 mmol/L — ABNORMAL HIGH (ref 0.0–2.0)
Bicarbonate: 59.4 mmol/L — ABNORMAL HIGH (ref 20.0–28.0)
DELIVERY SYSTEMS: POSITIVE
FIO2: 0.24
O2 SAT: 89.6 %
PATIENT TEMPERATURE: 37
PO2 ART: 53 mmHg — AB (ref 83.0–108.0)
pCO2 arterial: 78 mmHg (ref 32.0–48.0)
pH, Arterial: 7.49 — ABNORMAL HIGH (ref 7.350–7.450)

## 2017-07-23 LAB — MAGNESIUM: MAGNESIUM: 1.5 mg/dL — AB (ref 1.7–2.4)

## 2017-07-23 LAB — GLUCOSE, CAPILLARY
GLUCOSE-CAPILLARY: 106 mg/dL — AB (ref 65–99)
GLUCOSE-CAPILLARY: 118 mg/dL — AB (ref 65–99)
GLUCOSE-CAPILLARY: 120 mg/dL — AB (ref 65–99)
GLUCOSE-CAPILLARY: 97 mg/dL (ref 65–99)
Glucose-Capillary: 110 mg/dL — ABNORMAL HIGH (ref 65–99)
Glucose-Capillary: 113 mg/dL — ABNORMAL HIGH (ref 65–99)
Glucose-Capillary: 99 mg/dL (ref 65–99)

## 2017-07-23 LAB — PHOSPHORUS: Phosphorus: 3.7 mg/dL (ref 2.5–4.6)

## 2017-07-23 MED ORDER — VITAMIN B-1 100 MG PO TABS
100.0000 mg | ORAL_TABLET | Freq: Every day | ORAL | Status: DC
  Administered 2017-07-23 – 2017-08-06 (×14): 100 mg
  Filled 2017-07-23 (×15): qty 1

## 2017-07-23 MED ORDER — ADULT MULTIVITAMIN LIQUID CH
15.0000 mL | Freq: Every day | ORAL | Status: DC
  Administered 2017-07-23 – 2017-08-09 (×17): 15 mL
  Filled 2017-07-23 (×18): qty 15

## 2017-07-23 MED ORDER — MAGNESIUM SULFATE 2 GM/50ML IV SOLN: 2.0000 g | Freq: Once | INTRAVENOUS | Status: DC

## 2017-07-23 MED ORDER — ACETAZOLAMIDE 250 MG PO TABS
250.0000 mg | ORAL_TABLET | Freq: Once | ORAL | Status: DC
  Filled 2017-07-23: qty 1

## 2017-07-23 MED ORDER — MAGNESIUM SULFATE 2 GM/50ML IV SOLN
2.0000 g | Freq: Once | INTRAVENOUS | Status: AC
Start: 2017-07-23 — End: 2017-07-23
  Administered 2017-07-23: 2 g via INTRAVENOUS
  Filled 2017-07-23: qty 50

## 2017-07-23 NOTE — Progress Notes (Addendum)
MEDICATION RELATED CONSULT NOTE - INITIAL   Pharmacy Consult for electrolyte management Indication: hypophosphatemia  No Known Allergies  Patient Measurements: Height: 5' 2.5" (158.8 cm) Weight: 138 lb 3.7 oz (62.7 kg) IBW/kg (Calculated) : 51.25 Adjusted Body Weight:   Vital Signs: Temp: 98.1 F (36.7 C) (03/24 0800) Temp Source: Axillary (03/24 0108) BP: 99/58 (03/24 0900) Pulse Rate: 95 (03/24 0900) Intake/Output from previous day: 03/23 0701 - 03/24 0700 In: 1899.3 [P.O.:474; I.V.:1150; NG/GT:275.3] Out: 1101 [Urine:1100; Stool:1] Intake/Output from this shift: No intake/output data recorded.  Labs: Recent Labs    07/21/17 0717 07/22/17 0527 07/23/17 0541  WBC 12.0*  --  14.5*  HGB 8.7*  --  9.0*  HCT 27.9*  --  28.9*  PLT 218  --  234  CREATININE  --  <0.30* <0.30*  MG  --  1.7  --   PHOS  --  <1.0*  --    CrCl cannot be calculated (This lab value cannot be used to calculate CrCl because it is not a number: <0.30).   Microbiology: Recent Results (from the past 720 hour(s))  Urine culture     Status: Abnormal   Collection Time: 07/04/17  5:45 AM  Result Value Ref Range Status   Specimen Description   Final    URINE, CATHETERIZED Performed at [facility], [address], [place], [place] [address]    Special Requests   Final    NONE Performed at [facility], [place], [address] [place], [place] [address]    Culture (A)  Final    <10,000 COLONIES/mL INSIGNIFICANT GROWTH Performed at [facility] [address] [place], [place] [address]    Report Status 07/05/2017 FINAL  Final  Urine Culture     Status: None   Collection Time: 07/05/17 11:03 PM  Result Value Ref Range Status   Specimen Description   Final    URINE, RANDOM Performed at [facility], [address], [place], [place] [address]    Special Requests   Final    NONE Performed at [facility], [address] [place], [place] [address]    Culture   Final    NO GROWTH Performed at [facility], [place] [address] [place], [place] [address]    Report Status 07/07/2017 FINAL  Final  MRSA PCR Screening     Status: None   Collection Time: 07/15/17  2:07 AM  Result Value Ref Range Status   MRSA by PCR NEGATIVE NEGATIVE Final    Comment:        The GeneXpert MRSA Assay (FDA approved for NASAL specimens only), is one component of a comprehensive MRSA colonization surveillance program. It is not intended to diagnose MRSA infection nor to guide or monitor treatment for MRSA infections. Performed at [facility], [address], [place], [place] [address]     Medical History: Past Medical History:  Diagnosis Date  . Emphysema of lung ([place])   . GI bleed   . HLD (hyperlipidemia)     Medications:  Infusions:  . dextrose 50 mL/hr at 07/22/17 2202  . feeding supplement (VITAL HIGH PROTEIN) 20 mL/hr at 07/22/17 1915    Assessment: 43 yof here for dark stools and generalized weakness. Electrolyte abnormalities noted and pharmacy consulted to manage electrolytes.  Goal of Therapy:  K 3.5 to 5 Ca (adjusted) 8.9 to 10.3 Mg 1.7 to 2.4 Phos 2.5 to 4.6  Plan:  Patient received K Phos Merril Abbe  tab 1000 mg po BID x 2 doses, which in total would supply approximately 64 mmol phosphorus. K and Ca this AM were WNL. Will check add-on magnesium and phos levels and redose per protocol if necessary.  Add-on magnesium 1.5, phos 3.7. Give magnesium sulfate 2 gm IV x 1. Recheck electrolytes tomorrow with AM labs.  Laural Benes, Pharm.D., BCPS Clinical Pharmacist 07/23/2017,10:31 AM

## 2017-07-23 NOTE — Progress Notes (Signed)
Attempted to call patients son and husband. No answer. Left message for son per Dr Celesta Aver to call us back.

## 2017-07-23 NOTE — Progress Notes (Signed)
Increased IPAP to 20 per md request.

## 2017-07-23 NOTE — Progress Notes (Signed)
[place] [facility] [place] NAME: Kayla Boyer    MR#:  [id number]  DATE OF BIRTH:  1955/10/09  SUBJECTIVE:  Patient is still on BiPAP.  NG tube feeds started.  Patient left lower extremity is found to be weak REVIEW OF SYSTEMS:   Review of Systems  Constitutional: Negative for chills, fever and weight loss.  HENT: Negative for ear discharge, ear pain and nosebleeds.   Eyes: Negative for blurred vision, pain and discharge.  Respiratory: Negative for sputum production, shortness of breath, wheezing and stridor.   Cardiovascular: Negative for chest pain, palpitations, orthopnea and PND.  Gastrointestinal: Negative for abdominal pain, diarrhea, melena, nausea and vomiting.  Genitourinary: Negative for frequency and urgency.  Musculoskeletal: Negative for back pain and joint pain.  Neurological: Positive for weakness. Negative for sensory change, speech change and focal weakness.  Psychiatric/Behavioral: Negative for depression and hallucinations. The patient is not nervous/anxious.    Tolerating Diet: soft Tolerating PT: pending  DRUG ALLERGIES:  No Known Allergies  VITALS:  Blood pressure 112/78, pulse 84, temperature 98.5 F (36.9 C), resp. rate (!) 28, height 5' 2.5" (1.588 m), weight 62.7 kg (138 lb 3.7 oz), SpO2 100 %.  PHYSICAL EXAMINATION:   Physical Exam  GENERAL:  62 y.o.-year-old patient lying in the bed with no acute distress.  Appears chronically ill EYES: Pupils equal, round, reactive to light and accommodation. No scleral icterus. Extraocular muscles intact.  HEENT: Head atraumatic, normocephalic. Oropharynx and nasopharynx clear.  NECK:  Supple, no jugular venous distention. No thyroid enlargement, no tenderness.  LUNGS: decreased breath sounds bilaterally, no wheezing, rales, rhonchi. CARDIOVASCULAR: S1, S2 normal. No murmurs, rubs, or gallops.  ABDOMEN: Soft, nontender, nondistended. Bowel sounds present. No  organomegaly or mass.  EXTREMITIES: No cyanosis, clubbing or edema b/l.    NEUROLOGIC: Cranial nerves II through XII are intact. No focal Motor or sensory deficits b/l.   PSYCHIATRIC:  patient is lethargic SKIN: No obvious rash, lesion, or ulcer.   LABORATORY PANEL:  CBC Recent Labs  Lab 07/23/17 0541  WBC 14.5*  HGB 9.0*  HCT 28.9*  PLT 234    Chemistries  Recent Labs  Lab 07/19/17 0316  07/23/17 0541  NA 145   < > 140  K 3.0*   < > 4.0  CL 94*   < > 89*  CO2 41*   < > 41*  GLUCOSE 92   < > 116*  BUN 9   < > 12  CREATININE <0.30*   < > <0.30*  CALCIUM 9.1   < > 8.1*  MG 1.2*   < > 1.5*  AST 20  --   --   ALT 14  --   --   ALKPHOS 40  --   --   BILITOT 1.0  --   --    < > = values in this interval not displayed.   Cardiac Enzymes No results for input(s): TROPONINI in the last 168 hours. RADIOLOGY:  Dg Abd 1 View  Result Date: 07/22/2017 CLINICAL DATA:  Encounter for feeding tube placement. EXAM: ABDOMEN - 1 VIEW COMPARISON:  Chest radiograph 07/22/2017 FINDINGS: A nasogastric tube has been advanced into the abdomen. Catheter tip is in the distal stomach region. Bowel gas throughout the abdomen. Persistent densities at the left lung base and the left hemidiaphragm remains obscured. IMPRESSION: Feeding tube tip in the distal stomach region. Electronically Signed   By: Scherrie Gerlach.D.  On: 07/22/2017 16:30   Ct Head Wo Contrast  Result Date: 07/23/2017 CLINICAL DATA:  62 y/o F; changes to left pupil in strength of left arm. EXAM: CT HEAD WITHOUT CONTRAST TECHNIQUE: Contiguous axial images were obtained from the base of the skull through the vertex without intravenous contrast. COMPARISON:  None. FINDINGS: Brain: No evidence of acute infarction, hemorrhage, hydrocephalus, extra-axial collection or mass lesion/mass effect. Vascular: Calcific atherosclerosis of carotid siphons. No hyperdense vessel. Skull: Normal. Negative for fracture or focal lesion. Sinuses/Orbits: No  acute finding. Other: None. IMPRESSION: Negative CT of the head. Electronically Signed   By: Kristine Garbe M.D.   On: 07/23/2017 14:33   Dg Chest Port 1 View  Result Date: 07/22/2017 CLINICAL DATA:  Respiratory failure.  COPD. EXAM: PORTABLE CHEST 1 VIEW COMPARISON:  07/19/2017. FINDINGS: The cardiac silhouette remains borderline enlarged. Stable left pleural effusion and left basilar airspace opacity. Small right pleural effusion, decreased, with decreased adjacent right basilar atelectasis. Thoracic spine degenerative changes. IMPRESSION: 1. Stable left pleural effusion and left lower lobe atelectasis or pneumonia. 2. Decreased right pleural fluid and right basilar atelectasis. Electronically Signed   By: Claudie Revering M.D.   On: 07/22/2017 09:14   ASSESSMENT AND PLAN:  Kayla Boyer  is a 62 y.o. female with a known history of gastrointestinal bleeding, gastric ulcer, hyperlipidemia, emphysema of lung was referred by ER physician Dr.malinda for weakness and dark stools.  Patient presented to the emergency room with generalized weakness.  She has noticed a dark black stools for the last few days   * Acute on chronic hypoxic respiratory failure secondary to COPD exacerbation. -Patient has chronic alveolar hypoventilation appears to be due to impaired ventilatory drive -requiring prn Bipap -  -Started NG tube feeds as patient is getting malnourished  *Left sided weakness CT head is negative  *Hypophosphatemia Repleting phosphorus is at 3.7 today  *Hypomagnesemia replete and recheck - * acute on chornic gastrointestinal bleeding. - s/p EGD 3/18 showing Normal duodenal bulb and second portion of the duodenum. Duodenal lipoma. Non-bleeding gastric ulcer with a clean ulcer base (Forrest Class III).Normal gastroesophageal junction and esophagus. - protonix 40 mg IV bid - hemoglobin 9.7--8.0-- 7.9 --- 7.2 --- 7.1 --6.8 1 unit BT today--9.7--8.7--9.0 -Given rectal bleed discussed with  Dr. Tahiliani--recommending monitoring hemoglobin and transfusion as needed and PPI once patient is stable clinically might consider colonoscopy if continues to have GI bleed  *Symptomatic anemia with weakness PRBC transfusion as needed  *Palliative care met with patient and son.  For now patient is a full code.  Case discussed with Care Management/Social Worker. Management plans discussed with the patient, Dr Alva Garnet, family at bedside and they are in agreement.  CODE STATUS: Full  DVT Prophylaxis: scd  TOTAL TIME TAKING CARE OF THIS PATIENT: 25 minutes.  >50% time spent on counselling and coordination of care  POSSIBLE D/C IN few  DAYS, DEPENDING ON CLINICAL CONDITION.  Note: This dictation was prepared with Dragon dictation along with smaller phrase technology. Any transcriptional errors that result from this process are unintentional.  Nicholes Mango M.D on 07/23/2017 at 3:17 PM  Between 7am to 6pm - Pager - [phone]  After 6pm go to [email or web address] - password [email or web address]  [facility]  Office  [phone]  CC: Primary care physician; Patient, No Pcp PerPatient ID: Kayla Boyer, female   DOB: July 01, 1955, 62 y.o.   MRN: [id number]

## 2017-07-23 NOTE — Progress Notes (Signed)
Patient placed on HFNC at 50 liters and 35%

## 2017-07-23 NOTE — Progress Notes (Signed)
Spoke with Dr. Celesta Aver regarding patients heart rate dropping into the 30's when I removed Bipap for mouth care. MD aware, son was at bedside as well. At this time no new orders given.

## 2017-07-23 NOTE — Progress Notes (Signed)
Spoke with Dr Celesta Aver regarding oxygen level dropping into the 70's and heart rate dropped to 40's. She will assess patient.

## 2017-07-23 NOTE — Progress Notes (Signed)
Notified Dr Celesta Aver that patients right pupil is about a 5 sluggish and left is 2 sluggish. Patient left arm swollen and weak. Unable to lift arm up but does move it. Bilateral lower extremities unable to lift up but does wiggle and move feet. MD will evaluate and review patient.

## 2017-07-23 NOTE — Progress Notes (Signed)
Name: Kayla Boyer MRN: [id number] DOB: 12-21-1955    ADMISSION DATE:  07/14/2017  BRIEF PATIENT DESCRIPTION:  61 F hx COPD with recent hosp for GIB due to ulcers presenting with continued drop in Hgb 11 to 9.0.  Pt admitted to stepdown unit 03/15 with acute on chronic hypercapnic hypoxic respiratory failure and acute encephalopathy secondary to AECOPD requiring Bipap.     SIGNIFICANT EVENTS/STUDIES  03/15-Pt admitted to stepdown unit on with acute blood loss anemia, acute encephalopathy, and acute on chronic hypoxic hypercapnic respiratory failure on Bipap  03/17-Pt transferred to [facility] unit 03/18-Pt underwent EGD results revealed normal duodenal bulb and second portion of the  duodenum, duodenal lipoma, non-bleeding gastric ulcer with a clean ulcer base, and normal gastroesophageal junction and esophagus. Rapid response called pt developed acute encephalopathy secondary to acute on chronic hypercapnic respiratory failure likely secondary to sedating medications received during EGD requiring Bipap.  03/19 still requiring intermittent BiPAP 03/20 more awake and alert.  Cognition intact.  Still requiring intermittent BiPAP 03/20 TSH:  0.345 (0.350 - 4.50), free T4: 1.20 (0.61 - 1.12), free T3: 1.3 (2.0 - 4.4) 03/21 still requiring intermittent BiPAP for apparent hypoventilation with lethargy.  3/23 NGT place and nutrition started ( no Duodenal tubes available) 3/24 CT head to evaluate left arm weakness and symmetrical pupils    SUBJECTIVE:  Continues on BiPAP much more awake today.  She has left arm weakness but reports she has stopped using her left arm since July.  She still complains of anxiety   VITAL SIGNS: Temp:  [98.1 F (36.7 C)-99 F (37.2 C)] 98.5 F (36.9 C) (03/24 1100) Pulse Rate:  [84-124] 84 (03/24 1300) Resp:  [14-34] 28 (03/24 1434) BP: (95-156)/(49-85) 112/78 (03/24 1300) SpO2:  [91 %-100 %] 100 % (03/24 1434) FiO2 (%):  [24 %-33 %] 24 % (03/24 1400) Weight:   [133 lb 6.1 oz (60.5 kg)-138 lb 3.7 oz (62.7 kg)] 138 lb 3.7 oz (62.7 kg) (03/24 0457)  PHYSICAL EXAMINATION: Chronically ill-appearing, much more awake today HEENT: NCAT, sclerae white JVP cannot be visualized Diffusely diminished breath sounds, no wheezes or other adventitious sounds Heart sounds, regular, no Murmurs Obese, soft, nontender Extremities warm with 1+edema Cranial nerves intact but very deaf, left arm weakness , right pupil> left  Recent Labs  Lab 07/20/17 0425 07/22/17 0527 07/23/17 0541  NA 148* 144 140  K 4.4 4.5 4.0  CL 94* 93* 89*  CO2 48* 42* 41*  BUN _0 CREATININE <0.30* <0.30* <0.30*  GLUCOSE 132* 92 116*   Recent Labs  Lab 07/20/17 0425 07/21/17 0717 07/23/17 0541  HGB 9.7* 8.7* 9.0*  HCT 31.4* 27.9* 28.9*  WBC 15.5* 12.0* 14.5*  PLT 200 218 234   CXR: Mild bibasilar atelectasis with very small bilateral effusions  ASSESSMENT / PLAN: 1. UGIB with acute blood loss anemia with no obvious source identified by bleeding scan. No further active issues.  2. Acute on chronic hypercapnic respiratory failure due primarily to impaired ventilatory drive and now anxiety - Sick Euthyroid will need repeat when she is well. 3. Acute encephalopathy secondary to hypercapnia much improved clinically 4. Respiratory acidosis; metabolic alkalosis 5. Hypernatremia azotemia- has resolved 6. Hypokalemia, resolved 7. Hypophosphatemia- resolved 8. Malnutrition- protein and caloric    Plan;  1. Transfuse for Hg < 7, continue on PPI 2. Continue on NIPPV and Oxygen to keep SpO2 88-94% 3. Continue on NGT feed with slow increase to avoid refeeding syndrome 4. CT head -  negative for CVA. Patient unable to go for MRI 6. Continue nebulized steroids and bronchodilators 7. Will increase BIPAP setting for more support  Family:  Met with son and discussed goals of therapy, I have explained that patient is unable to live on continuous BIPAP and may require a  tracheostomy and peg tube for rehab placement.  He will discuss this with his mother  Needs to remain in SDU until she can remain off of BiPAP for a whole day    Cammie Sickle, MD PCCM service Pager [phone] 07/23/2017 3:40 PM

## 2017-07-23 NOTE — Progress Notes (Signed)
Initial Nutrition Assessment  DOCUMENTATION CODES:   Not applicable  INTERVENTION:  Will discontinue Ensure Enlive for now as patient is on BiPAP and not able to come off long enough to eat.  Plan is to check another phosphorus today. Patient is at high risk for refeeding syndrome. Continue monitoring potassium, phosphorus, and magnesium daily and replacing as needed until electrolytes are WNL at goal TF rate.  Provide liquid MVI daily and thiamine 100 mg daily per tube.  Recommend continuing Vital High Protein at trickle rate of 20 mL/hr today.  Once phosphorus is at least 2 mg/dL (but preferably 2.5 mg/dL) can switch to Vital 1.5 Cal at 20 mL/hr and advance by 10 mL/hr every 8 hours to goal rate of Vital 1.5 Cal at 40 ml/hr (960 mL goal daily volume) + Pro-Stat 30 mL BID via NGT. Provides 1640 kcal, 95 grams of protein, 730 ml H2O daily.  Once patient is off IV fluids recommend free water flush of 200 mL Q6hrs. Provides total of 1530 mL H2O daily including water in goal TF regimen. For now can provide standard of 30 mL Q4hrs in tube feed order set as patient is on D5W at 50 mL/hr (1200 mL daily).  NUTRITION DIAGNOSIS:   Increased nutrient needs related to catabolic illness(acute exacerbation of COPD) as evidenced by estimated needs.  GOAL:   Patient will meet greater than or equal to 90% of their needs  MONITOR:   PO intake, Labs, Weight trends, TF tolerance, I & O's  REASON FOR ASSESSMENT:   Malnutrition Screening Tool, Consult Enteral/tube feeding initiation and management  ASSESSMENT:   62 year old female with PMHx of HLD, GI bleed, emphysema of lung, COPD who was admitted 3/15 with acute blood loss anemia, acute encephalopathy, and acute on chronic hypercapnic respiratory failure secondary to acute exacerbation of COPD requiring BiPAP. On 3/17 she was able to transfer to [facility] unit. She underwent EGD on 3/18 and a rapid response was called due to respiratory failure  likely due to sedating medications once again requiring BiPAP.   -PMT has been following patient/family. Per a discussion on 3/22 her son reported she has been wheelchair and recently bed-bound after an ankle fracture 2-3 years ago. He also reported she lost over 100 lbs in approximately the past year though some of that weight loss was intentional.  Met with patient at bedside. Limited history obtained as she is on BiPAP. No family members present to help with history. Patient has had very poor PO intake during admission per review of chart. She was NPO/CLD from admission on 3/15 until advanced to [place] on 3/19. On 3/16 she had 75% of a clear liquid tray and then 10% of another clear liquid tray on 3/17. Apparently she was just having small bites of foods when advanced to a more regular-texture diet. Once she required BiPAP she has been unable to come off long enough to attempt to eat meals. Patient able to write on paper with right hand (unable to use left hand very well) and confirmed that her last "good meal" was a breakfast bar and a shake of some kind before admission. Patient has now had 9 days of very inadequate intake. Agree with initiation of tube feeds.  Very limited weight history in chart. There is a weight of 150 lbs on 11/01/2016 and 180 lbs on 05/05/2015, but both weights appear to be stated weights and not truly measured. If the weight from 05/05/2015 is close to accurate  she has likely not lost >100 lbs in the past year as son had reported to PMT.  Access: 14 Fr. NGT placed 3/23; terminates in distal stomach per abdominal x-ray on 3/23; 60 cm at left nare  MAP: 63-92 mmHg  TF: pt tolerating Vital High Protein at 20 mL/hr (was started 3/23 at 1714)  Medications reviewed and include: Ensure Enlive TID, pantoprazole, Senokot-S, D5W at 50 mL/hr (60 grams dextrose, 204 kcal daily). Patient received K Phos Neutral 1000 mg x2 PO/per tube on 3/23 (total of 16 mMol phosphorus and 2.2 mEq  potassium).  Labs reviewed: CBG 106-177 past 24 hrs, Chloride 89, CO2 41, Creatinine <0.3. On 3/23 Phosphorus <1.  I/O: 1100 mL UOP yesterday (0.7 mL/kg/hr)  Weight trend: 62.7 kg on 3/24 (+2.8 kg from 3/17) likely from fluid; will use weight of 59.9 kg from 3/17 to estimate needs  Patient is certainly at risk of malnutrition. Patient has evident muscle wasting (though her bed-bound status explains her muscle wasting in lower extremities). She has acutely had inadequate intake due to respiratory failure requiring BiPAP. Unsure of her long-term nutrition status due to inadequate history obtained at bedside. The reported weight loss by son to PMT is not evident in chart.  Discussed with RN and MD.  NUTRITION - FOCUSED PHYSICAL EXAM:    Most Recent Value  Orbital Region  No depletion  Upper Arm Region  No depletion  Thoracic and Lumbar Region  No depletion  Buccal Region  No depletion  Temple Region  Mild depletion  Clavicle Bone Region  Mild depletion  Clavicle and Acromion Bone Region  Mild depletion  Scapular Bone Region  Mild depletion  Dorsal Hand  Mild depletion  Patellar Region  Moderate depletion  Anterior Thigh Region  Moderate depletion  Posterior Calf Region  Severe depletion  Edema (RD Assessment)  None  Hair  Reviewed  Eyes  Reviewed  Mouth  Unable to assess  Skin  Reviewed  Nails  Reviewed      Diet Order:  Diet heart healthy/carb modified Room service appropriate? Yes; Fluid consistency: Thin  EDUCATION NEEDS:   Not appropriate for education at this time  Skin:  Skin Assessment: Skin Integrity Issues:(serous blister and skin tera to right upper arm)  Last BM:  07/22/2017 - large type 6/type 7  Height:   Ht Readings from Last 1 Encounters:  07/14/17 5' 2.5" (1.588 m)    Weight:   Wt Readings from Last 1 Encounters:  07/23/17 138 lb 3.7 oz (62.7 kg)    Ideal Body Weight:  51.1 kg  BMI:  Body mass index is 24.88 kg/m.  Estimated Nutritional  Needs:   Kcal:  1460-1680 (MSJ x 1.3-1.5)  Protein:  78-96 grams (1.3-1.6 grams/kg)  Fluid:  1.5-1.8 L/day (25-30 mL/kg)  Willey Blade, MS, RD, LDN Office: [phone] Pager: [phone] After Hours/Weekend Pager: [phone]

## 2017-07-23 NOTE — Progress Notes (Signed)
Patient transported to CT from ICU and back while on V60 Bipap with not complications.

## 2017-07-23 NOTE — Progress Notes (Signed)
PT Cancellation Note  Patient Details Name: Kayla Boyer MRN: [id number] DOB: 13-Feb-1956   Cancelled Treatment:    Reason Eval/Treat Not Completed: Medical issues which prohibited therapy(Patient remains medically unable to tolerate PT intervention at this time.  Have made consistent attempts x4 days.  Will complete order at this time.  Please re-consult as patient medically appropriate and able to tolerate/actively participate with evaluation)  Tiran Sauseda H. Owens Shark, PT, DPT, NCS 07/23/17, 12:00 PM [phone]

## 2017-07-24 DIAGNOSIS — E46 Unspecified protein-calorie malnutrition: Secondary | ICD-10-CM

## 2017-07-24 DIAGNOSIS — J9622 Acute and chronic respiratory failure with hypercapnia: Secondary | ICD-10-CM

## 2017-07-24 DIAGNOSIS — R29898 Other symptoms and signs involving the musculoskeletal system: Secondary | ICD-10-CM

## 2017-07-24 DIAGNOSIS — Z9189 Other specified personal risk factors, not elsewhere classified: Secondary | ICD-10-CM

## 2017-07-24 DIAGNOSIS — Z515 Encounter for palliative care: Secondary | ICD-10-CM

## 2017-07-24 LAB — GLUCOSE, CAPILLARY
GLUCOSE-CAPILLARY: 108 mg/dL — AB (ref 65–99)
GLUCOSE-CAPILLARY: 112 mg/dL — AB (ref 65–99)
GLUCOSE-CAPILLARY: 114 mg/dL — AB (ref 65–99)
GLUCOSE-CAPILLARY: 125 mg/dL — AB (ref 65–99)
Glucose-Capillary: 102 mg/dL — ABNORMAL HIGH (ref 65–99)
Glucose-Capillary: 120 mg/dL — ABNORMAL HIGH (ref 65–99)
Glucose-Capillary: 136 mg/dL — ABNORMAL HIGH (ref 65–99)

## 2017-07-24 LAB — PHOSPHORUS: Phosphorus: 4.8 mg/dL — ABNORMAL HIGH (ref 2.5–4.6)

## 2017-07-24 LAB — BASIC METABOLIC PANEL
ANION GAP: 8 (ref 5–15)
BUN: 9 mg/dL (ref 6–20)
CALCIUM: 8.4 mg/dL — AB (ref 8.9–10.3)
CO2: 43 mmol/L — AB (ref 22–32)
Chloride: 93 mmol/L — ABNORMAL LOW (ref 101–111)
Creatinine, Ser: <0.3 mg/dL — ABNORMAL LOW (ref 0.44–1.00)
Glucose, Bld: 120 mg/dL — ABNORMAL HIGH (ref 65–99)
POTASSIUM: 4.9 mmol/L (ref 3.5–5.1)
Sodium: 144 mmol/L (ref 135–145)

## 2017-07-24 LAB — MAGNESIUM: MAGNESIUM: 2.2 mg/dL (ref 1.7–2.4)

## 2017-07-24 MED ORDER — VITAL 1.5 CAL PO LIQD
1000.0000 mL | ORAL | Status: DC
  Administered 2017-07-24 – 2017-07-27 (×4): 1000 mL

## 2017-07-24 MED ORDER — PRO-STAT SUGAR FREE PO LIQD
30.0000 mL | Freq: Two times a day (BID) | ORAL | Status: DC
  Administered 2017-07-24 (×2): 30 mL via ORAL

## 2017-07-24 NOTE — Progress Notes (Addendum)
   07/24/17 1200  Clinical Encounter Type  Visited With Patient not available  Visit Type Follow-up  Spiritual Encounters  Spiritual Needs Prayer   Chaplain attempted visit; patient sleeping, did not respond to chaplain's voice. Chaplain offered silent prayer for patient.

## 2017-07-24 NOTE — Progress Notes (Signed)
Name: Kayla Boyer MRN: [id number] DOB: 05-02-1956    ADMISSION DATE:  07/14/2017  BRIEF PATIENT DESCRIPTION:  53 F hx COPD with recent hosp for GIB due to ulcers presenting with continued drop in Hgb 11 to 9.0.  Pt admitted to stepdown unit 03/15 with acute on chronic hypercapnic hypoxic respiratory failure and acute encephalopathy secondary to AECOPD requiring Bipap.     SIGNIFICANT EVENTS/STUDIES  03/15-Pt admitted to stepdown unit on with acute blood loss anemia, acute encephalopathy, and acute on chronic hypoxic hypercapnic respiratory failure on Bipap  03/17-Pt transferred to [facility] unit 03/18-Pt underwent EGD results revealed normal duodenal bulb and second portion of the  duodenum, duodenal lipoma, non-bleeding gastric ulcer with a clean ulcer base, and normal gastroesophageal junction and esophagus. Rapid response called pt developed acute encephalopathy secondary to acute on chronic hypercapnic respiratory failure likely secondary to sedating medications received during EGD requiring Bipap.  03/19 still requiring intermittent BiPAP 03/20 more awake and alert.  Cognition intact.  Still requiring intermittent BiPAP 03/20 TSH:  0.345 (0.350 - 4.50), free T4: 1.20 (0.61 - 1.12), free T3: 1.3 (2.0 - 4.4) 03/21 still requiring intermittent BiPAP for apparent hypoventilation with lethargy.  3/23 NGT place and nutrition started ( no Duodenal tubes available) 3/24 CT head to evaluate left arm weakness and symmetrical pupils    SUBJECTIVE:  Continues on BiPAP.  She remains awake and reports that her breathing is better.  VITAL SIGNS: Temp:  [96.9 F (36.1 C)-98.7 F (37.1 C)] 98.7 F (37.1 C) (03/25 0700) Pulse Rate:  [65-97] 77 (03/25 0900) Resp:  [15-34] 16 (03/25 0900) BP: (92-134)/(55-120) 111/66 (03/25 0900) SpO2:  [95 %-100 %] 99 % (03/25 0900) FiO2 (%):  [24 %] 24 % (03/25 0700) Weight:  [134 lb 7.7 oz (61 kg)] 134 lb 7.7 oz (61 kg) (03/25 0600)  PHYSICAL  EXAMINATION: Chronically ill-appearing,  awake and interactive HEENT: NCAT, sclerae white, drooling when off BIPAP Diffusely diminished breath sounds, no wheezes or other adventitious sounds Heart sounds, regular, no Murmurs Obese, soft, nontender Extremities warm with 1+edema Cranial nerves intact but very deaf, left arm weakness , right pupil> left Lower extremities 2/5 bilateral  Recent Labs  Lab 07/22/17 0527 07/23/17 0541 07/24/17 0449  NA 144 140 144  K 4.5 4.0 4.9  CL 93* 89* 93*  CO2 42* 41* 43*  BUN 9 12 9   CREATININE <0.30* <0.30* <0.30*  GLUCOSE 92 116* 120*   Recent Labs  Lab 07/20/17 0425 07/21/17 0717 07/23/17 0541  HGB 9.7* 8.7* 9.0*  HCT 31.4* 27.9* 28.9*  WBC 15.5* 12.0* 14.5*  PLT 200 218 234   CXR: Mild bibasilar atelectasis with very small bilateral effusions  ASSESSMENT / PLAN: 1. UGIB with acute blood loss anemia with no obvious source identified by bleeding scan. No further active issues.  2. Acute on chronic hypercapnic respiratory failure due primarily to impaired ventilatory drive and global muscle weakness - Sick Euthyroid will need repeat when she is well. 3. Acute encephalopathy secondary to hypercapnia has resolved 4. Respiratory acidosis with compensated metabolic alkalosis 5. Moderate Malnutrition- protein and caloric  6. Severe deconditioning with global muscle weakness causing respiratory impairment 7. Possible CVA- left arm weakness, drooling. CT head negative but patient unable to tolerate MRI safely.   Plan;  1. Transfuse for Hg < 7, continue on PPI 2. Continue on NIPPV and HFO to keep SpO2 88-94%  3. Continue on NGT feed with slow increase to avoid refeeding syndrome 4.  ENT consulted  for tracheostomy 5. Recall GI for peg tube placement  Family: Our team ( myself, nurse and palliative care NP ) met with patient and son.  They have agreed to trach and peg tube placement.    Cammie Sickle, MD PCCM service Pager  [phone] 07/24/2017 1:15 PM

## 2017-07-24 NOTE — Care Management (Signed)
Patient is slumped over using bipap still; she looks very much like she did last week when I tried to see her. Patient does not have health insurance listed. RNCM will follow.

## 2017-07-24 NOTE — Consult Note (Signed)
Kayla Boyer, Kayla Boyer [id number] May 27, 1955 Kayla Mango, MD  Reason for Consult: Evaluate for tracheostomy  HPI: The patient is a 62 year old Afro-American female who has had COPD.  She presented to the hospital because of a GI bleed with a drop in hemoglobin.  She had GI endoscopy and had worsening respiratory status requiring BiPAP therapy.  She has been on BiPAP now for 10 straight days and still having significant respiratory distress secondary to her underlying COPD and muscle weakness and deconditioning.  She does not seem to be getting any stronger and there is no likelihood of her getting off BiPAP 24 hours a day in the near future.  Assessment is made if she is a candidate for tracheostomy.  This is been discussed with the patient and her son and the understand some of the options and wished to proceed.  Allergies: No Known Allergies  ROS: Review of systems normal other than 12 systems except per HPI.  PMH:  Past Medical History:  Diagnosis Date  . Emphysema of lung ([place])   . GI bleed   . HLD (hyperlipidemia)     FH:  Family History  Family history unknown: Yes    SH:  Social History   Socioeconomic History  . Marital status: Married    Spouse name: Not on file  . Number of children: Not on file  . Years of education: Not on file  . Highest education level: Not on file  Occupational History  . Not on file  Social Needs  . Financial resource strain: Not on file  . Food insecurity:    Worry: Not on file    Inability: Not on file  . Transportation needs:    Medical: Not on file    Non-medical: Not on file  Tobacco Use  . Smoking status: Never Smoker  . Smokeless tobacco: Never Used  Substance and Sexual Activity  . Alcohol use: No    Frequency: Never  . Drug use: No  . Sexual activity: Not on file  Lifestyle  . Physical activity:    Days per week: Not on file    Minutes per session: Not on file  . Stress: Not on file  Relationships  . Social connections:   Talks on phone: Not on file    Gets together: Not on file    Attends religious service: Not on file    Active member of club or organization: Not on file    Attends meetings of clubs or organizations: Not on file    Relationship status: Not on file  . Intimate partner violence:    Fear of current or ex partner: Not on file    Emotionally abused: Not on file    Physically abused: Not on file    Forced sexual activity: Not on file  Other Topics Concern  . Not on file  Social History Narrative  . Not on file    PSH:  Past Surgical History:  Procedure Laterality Date  . ESOPHAGOGASTRODUODENOSCOPY (EGD) WITH PROPOFOL N/A 07/17/2017   Procedure: ESOPHAGOGASTRODUODENOSCOPY (EGD) WITH PROPOFOL;  Surgeon: Lin Landsman, MD;  Location: [place];  Service: Gastroenterology;  Laterality: N/A;  . NO PAST SURGERIES    . OOPHORECTOMY Left     Physical  Exam: The patient is awake and alert and understands well what we are discussing.  She is hard of hearing but if you talk loud in her right ear she can understand well.  She is on BiPAP currently but is taken  off for a short period to assess.  She has no oral lesions.  Her nose is open anteriorly.  Her neck is normal size with no nodes or masses palpable.  She has good landmarks visible and palpable in the anterior neck with no thyromegaly.  She is relatively thin.   A/P: The patient has severe COPD with muscle weakening and inability to maintain oxygenation on her own.  She has been on BiPAP for 10 days with no significant improvement.  The best option for her would be to have a tracheostomy placed to assist with ventilation currently and long-term weaning back to her baseline.  I discussed this with the son and the patient and they both understand the surgery and the potential risks, and wished to proceed.  Informed surgical consent is signed.  This is tentatively scheduled for 9:30 AM Thursday morning.  She will need to be off her  anticoagulants for surgery and will need to have the tube feeding stopped by midnight the night before.  She should be able to come right back to the ICU once the trach tube is placed.   Elon Alas Mayley Lish 07/24/2017 7:04 PM

## 2017-07-24 NOTE — Progress Notes (Signed)
Pt does not show any signs and symptoms of distress while on high flow nasal canula. Pt assisted nurse with bath and turning in bed with no signs of distress. Family (sister) arrived and pt demanded another staff to be placed back on bipap and wanting her "nerve pill" stating she was anxious. Rn asked if there was anything to help decrease the stimulus causing her to be anxious and patient nodded "no." Nurse placed patient back on bipap per patients request. Once family left and after having her xanax patient wanted to return back on to HFNC.

## 2017-07-24 NOTE — Progress Notes (Signed)
Daily Progress Note   Patient Name: Kayla Boyer       Date: 07/24/2017 DOB: February 19, 1956  Age: 62 y.o. MRN#: [id number] Attending Physician: Nicholes Mango, MD Primary Care Physician: Patient, [name] Admit Date: 07/14/2017  Reason for Consultation/Follow-up: Establishing goals of care  Subjective: Patient awake, alert, and oriented. Delayed responses but will write on paper.   GOC:  [place] discussion with patient, son [name]), and PCCM MD (Dr. Celesta Aver). Discussed hospital diagnoses, interventions, and underlying deconditioning. [name] speaks of her being bed bound since last summer after episode of pneumonia. She is cared for at home by her husband. Patient does not have a documented living will with EOL wishes. [name] is her only child. Comfort versus trach/peg was discussed. [name] wishes to pursue with trach/peg placement. Answered questions and concerns before he needed to leave for work.   After son left, I sat with patient and further explained diagnoses and interventions. She writes "how did this happen." I explained general deconditioning contributing to weakened diaphragm which leads to CO2 buildup/acute on chronic respiratory failure.  I further discussed continuing aggressive interventions (trach/peg) versus comfort focused care--explaining time would be short with this pathway. I explained trach/peg procedures and showed patient pictures so she had a better understanding. She nods her head yes with wanting to precede with trach/peg placement. She denies fears or concerns. She writes "thank you" for answering her questions.   Emotional/spiritual support provided.    Length of Stay: 10  Current Medications: Scheduled Meds:  . chlorhexidine  15 mL Mouth Rinse BID  . feeding supplement  (PRO-STAT SUGAR FREE 64)  30 mL Oral BID  . feeding supplement (VITAL 1.5 CAL)  1,000 mL Per Tube Q24H  . ipratropium-albuterol  3 mL Nebulization Q6H  . mouth rinse  15 mL Mouth Rinse q12n4p  . multivitamin  15 mL Per Tube Daily  . pantoprazole  40 mg Oral BID AC  . senna-docusate  1 tablet Oral BID  . thiamine  100 mg Per Tube Daily  . thyroid  30 mg Oral QAC breakfast    Continuous Infusions: . dextrose 50 mL/hr at 07/23/17 2124    PRN Meds: acetaminophen **OR** acetaminophen, ALPRAZolam, bisacodyl, metoprolol tartrate, [DISCONTINUED] ondansetron **OR** ondansetron (ZOFRAN) IV  Physical Exam  Constitutional: She is cooperative. She appears ill.  HENT:  Head: Normocephalic and atraumatic.  Cardiovascular: Regular rhythm.  Pulmonary/Chest:  Taken off BiPAP to HFNC. Dyspnea at rest.  Neurological: She is alert.  Psychiatric: She has a normal mood and affect. Her behavior is normal. Her speech is delayed.  Nursing note and vitals reviewed.          Vital Signs: BP 111/66   Pulse 77   Temp 98.7 F (37.1 C) (Axillary)   Resp 16   Ht 5' 2.5" (1.588 m)   Wt 61 kg (134 lb 7.7 oz)   SpO2 99%   BMI 24.20 kg/m  SpO2: SpO2: 99 % O2 Device: O2 Device: Bi-PAP O2 Flow Rate: O2 Flow Rate (L/min): 40 L/min  Intake/output summary:   Intake/Output Summary (Last 24 hours) at 07/24/2017 1305 Last data filed at 07/24/2017 0900 Gross per 24 hour  Intake 1890 ml  Output 900 ml  Net 990 ml   LBM: Last BM Date: 07/22/17 Baseline Weight: Weight: 60.8 kg (134 lb) Most recent weight: Weight: 61 kg (134 lb 7.7 oz)       Palliative Assessment/Data: PPS 30%     Patient Active Problem List   Diagnosis Date Noted  . Acute respiratory failure ([place])   . Goals of care, counseling/discussion   . Palliative care encounter   . Chronic gastric ulcer without hemorrhage and without perforation   . GI bleed 07/14/2017  . Anemia 06/05/2017  . Fall 10/31/2016  . Tachycardia 10/31/2016  .  HLD (hyperlipidemia) 10/31/2016    Palliative Care Assessment & Plan   Patient Profile: 62 y.o. female  with past medical history of recent admission for GIB gastric ulcers, HLD, COPD admitted on 07/14/2017 with weakness and dark stools. Hospitalization complicated by continued reliance on BiPAP and impaired ventilation as well as GIB. PCCM questioning "low T3 syndrome." Per notes she has been wheelchair bound for ~3 years and with poor appetite and low functional status. CT head negative on 3/24. Unable to tolerate MRI. Patient remains on BiPAP.   Assessment: Acute on chronic hypercapnic respiratory failure secondary to impaired ventilatory drive/muscle weakness Sick Euthyroid UGIB resolved Malnutrition Severe deconditioning ? CVA  Recommendations/Plan:  After discussion with patient, son, and Dr. Celesta Aver, plan is to continue FULL code/FULL scope including trach and PEG placement.   PMT will continue to follow and support patient/family through hospitalization.  Goals of Care and Additional Recommendations:  Limitations on Scope of Treatment: Full Scope Treatment  Code Status: FULL   Code Status Orders  (From admission, onward)        Start     Ordered   07/14/17 2115  Full code  Continuous     07/14/17 2122    Code Status History    Date Active Date Inactive Code Status Order ID Comments User Context   06/05/2017 1654 06/08/2017 1820 Full Code [id number]  Bettey Costa, MD Inpatient   11/01/2016 0121 11/09/2016 2044 Full Code [id number]  Lance Coon, MD Inpatient       Prognosis:   Unable to determine guarded with acute on chronic respiratory failure requiring Bipap.  Discharge Planning:  To Be Determined  Care plan was discussed with patient, son, RN, Dr. Celesta Aver  Thank you for allowing the Palliative Medicine Team to assist in the care of this patient.   Time In: 1230 Time Out: 1310 Total Time 59min Prolonged Time Billed no      Greater than 50%  of this time  was spent counseling and coordinating care related  to the above assessment and plan.  Ihor Dow, FNP-C Palliative Medicine Team  Phone: [phone] Fax: [phone]  Please contact Palliative Medicine Team phone at [phone] for questions and concerns.

## 2017-07-24 NOTE — Progress Notes (Signed)
OT Cancellation Note  Patient Details Name: Kayla Boyer MRN: [id number] DOB: [birth year]   Cancelled Treatment:    Reason Eval/Treat Not Completed: Patient not medically ready. Patient remains medically unable to tolerate OT intervention at this time. Have made consistent attempts since 07/20/17.  Will complete order at this time.  Please re-consult as patient medically appropriate and able to tolerate/actively participate with evaluation.   Jeni Salles, MPH, MS, OTR/L ascom [phone] 07/24/17, 8:11 AM

## 2017-07-24 NOTE — Progress Notes (Addendum)
Pharmacy Electrolyte Monitoring Consult:  Pharmacy consulted to assist in monitoring and replacing electrolytes in this 62 y.o. female admitted on 07/14/2017 with Weakness   Labs:  Sodium (mmol/L)  Date Value  07/24/2017 144   Potassium (mmol/L)  Date Value  07/24/2017 4.9   Magnesium (mg/dL)  Date Value  07/24/2017 2.2   Phosphorus (mg/dL)  Date Value  07/24/2017 4.8 (H)   Calcium (mg/dL)  Date Value  07/24/2017 8.4 (L)   Albumin (g/dL)  Date Value  07/19/2017 2.5 (L)    Plan: Electrolytes are WNL. Advancing feeds today and will check electrolytes with AM labs.   Candelaria Stagers, PharmD Pharmacy Resident  07/24/2017 10:09 AM

## 2017-07-24 NOTE — Progress Notes (Signed)
[place] at [place] NAME: Kayla Boyer    MR#:  [id number]  DATE OF BIRTH:  [birth year]  SUBJECTIVE:  Patient is off BiPAP. On high flow o2  NG tube feeds started.  CT head is neg  REVIEW OF SYSTEMS:   Review of Systems  Constitutional: Negative for chills, fever and weight loss.  HENT: Negative for ear discharge, ear pain and nosebleeds.   Eyes: Negative for blurred vision, pain and discharge.  Respiratory: Negative for sputum production, shortness of breath, wheezing and stridor.   Cardiovascular: Negative for chest pain, palpitations, orthopnea and PND.  Gastrointestinal: Negative for abdominal pain, diarrhea, melena, nausea and vomiting.  Genitourinary: Negative for frequency and urgency.  Musculoskeletal: Negative for back pain and joint pain.  Neurological: Positive for weakness. Negative for sensory change, speech change and focal weakness.  Psychiatric/Behavioral: Negative for depression and hallucinations. The patient is not nervous/anxious.    Tolerating Diet: soft Tolerating PT: pending  DRUG ALLERGIES:  No Known Allergies  VITALS:  Blood pressure 119/88, pulse 99, temperature 98.7 F (37.1 C), temperature source Axillary, resp. rate 19, height 5' 2.5" (1.588 m), weight 61 kg (134 lb 7.7 oz), SpO2 100 %.  PHYSICAL EXAMINATION:   Physical Exam  GENERAL:  62 y.o.-year-old patient lying in the bed with no acute distress.  Appears chronically ill EYES: Pupils equal, round, reactive to light and accommodation. No scleral icterus. Extraocular muscles intact.  HEENT: Head atraumatic, normocephalic. Oropharynx and nasopharynx clear. Drooling  NECK:  Supple, no jugular venous distention. No thyroid enlargement, no tenderness.  LUNGS: decreased breath sounds bilaterally, no wheezing, rales, rhonchi. CARDIOVASCULAR: S1, S2 normal. No murmurs, rubs, or gallops.  ABDOMEN: Soft, nontender, nondistended. Bowel sounds present. No  organomegaly or mass.  EXTREMITIES: No cyanosis, clubbing or edema b/l.    NEUROLOGIC: Cranial nerves II through XII are intact. No focal Motor or sensory deficits b/l.   PSYCHIATRIC:  patient is lethargic SKIN: No obvious rash, lesion, or ulcer.   LABORATORY PANEL:  CBC Recent Labs  Lab 07/23/17 0541  WBC 14.5*  HGB 9.0*  HCT 28.9*  PLT 234    Chemistries  Recent Labs  Lab 07/19/17 0316  07/24/17 0449  NA 145   < > 144  K 3.0*   < > 4.9  CL 94*   < > 93*  CO2 41*   < > 43*  GLUCOSE 92   < > 120*  BUN 9   < > 9  CREATININE <0.30*   < > <0.30*  CALCIUM 9.1   < > 8.4*  MG 1.2*   < > 2.2  AST 20  --   --   ALT 14  --   --   ALKPHOS 40  --   --   BILITOT 1.0  --   --    < > = values in this interval not displayed.   Cardiac Enzymes No results for input(s): TROPONINI in the last 168 hours. RADIOLOGY:  Ct Head Wo Contrast  Result Date: 07/23/2017 CLINICAL DATA:  62 y/o F; changes to left pupil in strength of left arm. EXAM: CT HEAD WITHOUT CONTRAST TECHNIQUE: Contiguous axial images were obtained from the base of the skull through the vertex without intravenous contrast. COMPARISON:  None. FINDINGS: Brain: No evidence of acute infarction, hemorrhage, hydrocephalus, extra-axial collection or mass lesion/mass effect. Vascular: Calcific atherosclerosis of carotid siphons. No hyperdense vessel. Skull: Normal. Negative for fracture or focal  lesion. Sinuses/Orbits: No acute finding. Other: None. IMPRESSION: Negative CT of the head. Electronically Signed   By: Kristine Garbe M.D.   On: 07/23/2017 14:33   Dg Chest Port 1 View  Result Date: 07/23/2017 CLINICAL DATA:  Acute on chronic respiratory failure. Airway aspiration. EXAM: PORTABLE CHEST 1 VIEW COMPARISON:  07/22/2017. FINDINGS: Interval enlargement of the cardiac silhouette. Stable dense left lower lobe airspace opacity. Interval increased linear and patchy density at the right lung base. Stable small bilateral pleural  effusions. Nasogastric tube extending into the stomach. Thoracic spine degenerative changes. IMPRESSION: 1. No significant change in dense left lower lobe atelectasis or pneumonia and small left pleural effusion. 2. Increased right basilar atelectasis or pneumonia with a stable small right pleural effusion. Electronically Signed   By: Claudie Revering M.D.   On: 07/23/2017 16:09   ASSESSMENT AND PLAN:  Tonea Boyer  is a 62 y.o. female with a known history of gastrointestinal bleeding, gastric ulcer, hyperlipidemia, emphysema of lung was referred by ER physician Dr.malinda for weakness and dark stools.  Patient presented to the emergency room with generalized weakness.  She has noticed a dark black stools for the last few days   * Acute on chronic hypoxic respiratory failure secondary to COPD exacerbation. -Patient has chronic alveolar hypoventilation appears to be due to impaired ventilatory drive -requiring prn Bipap - currently on high flow o2  -agreeable for trach n PEG -NG tube feeds as patient is getting malnourished  *Left sided weakness CT head is negative  *Hypophosphatemia Repleting phosphorus is at 3.7 today  *Hypomagnesemia replete and recheck - * acute on chornic gastrointestinal bleeding. - s/p EGD 3/18 showing Normal duodenal bulb and second portion of the duodenum. Duodenal lipoma. Non-bleeding gastric ulcer with a clean ulcer base (Forrest Class III).Normal gastroesophageal junction and esophagus. - protonix 40 mg IV bid - hemoglobin 9.7--8.0-- 7.9 --- 7.2 --- 7.1 --6.8 1 unit BT today--9.7--8.7--9.0 -Given rectal bleed discussed with Dr. Tahiliani--recommending monitoring hemoglobin and transfusion as needed and PPI once patient is stable clinically might consider colonoscopy if continues to have GI bleed  *Symptomatic anemia with weakness PRBC transfusion as needed  *Palliative care met with patient and son.  For now patient is a full code.  D/w dr.Richards  Case  discussed with Care Management/Social Worker. Management plans discussed with the patient, Dr Alva Garnet, family at bedside and they are in agreement.  CODE STATUS: Full  DVT Prophylaxis: scd  TOTAL TIME TAKING CARE OF THIS PATIENT: 25 minutes.  >50% time spent on counselling and coordination of care  POSSIBLE D/C IN few  DAYS, DEPENDING ON CLINICAL CONDITION.  Note: This dictation was prepared with Dragon dictation along with smaller phrase technology. Any transcriptional errors that result from this process are unintentional.  Nicholes Mango M.D on 07/24/2017 at 6:02 PM  Between 7am to 6pm - Pager - [phone]  After 6pm go to [email or web address] - password [email or web address]  [facility]  Office  [phone]  CC: Primary care physician; Patient, No Pcp PerPatient ID: [name], female   DOB: 1955/05/09, 62 y.o.   MRN: [id number]

## 2017-07-24 NOTE — Progress Notes (Signed)
Patient had a 13 second pause on the cardiac monitor. Monitor noted with asystole. Patient alert and following commands. Maggie, NP notified.  No new orders at this time, continue to monitor.

## 2017-07-25 ENCOUNTER — Inpatient Hospital Stay: Payer: Self-pay

## 2017-07-25 ENCOUNTER — Inpatient Hospital Stay: Payer: Medicaid Other

## 2017-07-25 DIAGNOSIS — E876 Hypokalemia: Secondary | ICD-10-CM

## 2017-07-25 LAB — CBC WITH DIFFERENTIAL/PLATELET
BASOS ABS: 0 10*3/uL (ref 0–0.1)
Basophils Relative: 0 %
EOS PCT: 3 %
Eosinophils Absolute: 0.3 10*3/uL (ref 0–0.7)
HEMATOCRIT: 31.2 % — AB (ref 35.0–47.0)
Hemoglobin: 9.8 g/dL — ABNORMAL LOW (ref 12.0–16.0)
LYMPHS ABS: 0.5 10*3/uL — AB (ref 1.0–3.6)
LYMPHS PCT: 4 %
MCH: 29.1 pg (ref 26.0–34.0)
MCHC: 31.3 g/dL — ABNORMAL LOW (ref 32.0–36.0)
MCV: 92.9 fL (ref 80.0–100.0)
MONO ABS: 0.8 10*3/uL (ref 0.2–0.9)
Monocytes Relative: 7 %
NEUTROS ABS: 9.8 10*3/uL — AB (ref 1.4–6.5)
Neutrophils Relative %: 86 %
PLATELETS: 373 10*3/uL (ref 150–440)
RBC: 3.36 MIL/uL — AB (ref 3.80–5.20)
RDW: 17.3 % — ABNORMAL HIGH (ref 11.5–14.5)
WBC: 11.5 10*3/uL — AB (ref 3.6–11.0)

## 2017-07-25 LAB — MAGNESIUM: MAGNESIUM: 1.9 mg/dL (ref 1.7–2.4)

## 2017-07-25 LAB — BASIC METABOLIC PANEL
Anion gap: 10 (ref 5–15)
BUN: 12 mg/dL (ref 6–20)
CO2: 40 mmol/L — ABNORMAL HIGH (ref 22–32)
Calcium: 8.4 mg/dL — ABNORMAL LOW (ref 8.9–10.3)
Chloride: 89 mmol/L — ABNORMAL LOW (ref 101–111)
GLUCOSE: 119 mg/dL — AB (ref 65–99)
Potassium: 2.8 mmol/L — ABNORMAL LOW (ref 3.5–5.1)
SODIUM: 139 mmol/L (ref 135–145)

## 2017-07-25 LAB — GLUCOSE, CAPILLARY
GLUCOSE-CAPILLARY: 120 mg/dL — AB (ref 65–99)
GLUCOSE-CAPILLARY: 110 mg/dL — AB (ref 65–99)
GLUCOSE-CAPILLARY: 90 mg/dL (ref 65–99)
Glucose-Capillary: 111 mg/dL — ABNORMAL HIGH (ref 65–99)
Glucose-Capillary: 114 mg/dL — ABNORMAL HIGH (ref 65–99)

## 2017-07-25 LAB — POTASSIUM: POTASSIUM: 4.3 mmol/L (ref 3.5–5.1)

## 2017-07-25 LAB — PROTIME-INR
INR: 0.93
Prothrombin Time: 12.4 seconds (ref 11.4–15.2)

## 2017-07-25 LAB — PHOSPHORUS: Phosphorus: 2.4 mg/dL — ABNORMAL LOW (ref 2.5–4.6)

## 2017-07-25 MED ORDER — POTASSIUM CHLORIDE 20 MEQ PO PACK
40.0000 meq | PACK | Freq: Two times a day (BID) | ORAL | Status: AC
  Administered 2017-07-25 (×2): 40 meq
  Filled 2017-07-25: qty 2

## 2017-07-25 MED ORDER — SODIUM CHLORIDE 0.9% FLUSH
10.0000 mL | Freq: Two times a day (BID) | INTRAVENOUS | Status: DC
  Administered 2017-07-26 – 2017-08-02 (×13): 10 mL
  Administered 2017-08-03: 20 mL
  Administered 2017-08-03 – 2017-08-11 (×14): 10 mL
  Administered 2017-08-12 (×2): 20 mL
  Administered 2017-08-13 – 2017-08-17 (×7): 10 mL

## 2017-07-25 MED ORDER — SODIUM CHLORIDE 0.9% FLUSH: 10.0000 mL | INTRAVENOUS | Status: DC | PRN

## 2017-07-25 MED ORDER — PRO-STAT SUGAR FREE PO LIQD
30.0000 mL | Freq: Two times a day (BID) | ORAL | Status: DC
  Administered 2017-07-25 – 2017-07-26 (×4): 30 mL

## 2017-07-25 MED ORDER — POTASSIUM CHLORIDE 10 MEQ/100ML IV SOLN
10.0000 meq | INTRAVENOUS | Status: DC
  Administered 2017-07-25: 10 meq via INTRAVENOUS
  Filled 2017-07-25 (×4): qty 100

## 2017-07-25 MED ORDER — SENNOSIDES 8.8 MG/5ML PO SYRP
5.0000 mL | ORAL_SOLUTION | Freq: Two times a day (BID) | ORAL | Status: DC
  Administered 2017-07-25 – 2017-08-07 (×22): 5 mL
  Filled 2017-07-25 (×29): qty 5

## 2017-07-25 MED ORDER — K PHOS MONO-SOD PHOS DI & MONO 155-852-130 MG PO TABS
500.0000 mg | ORAL_TABLET | Freq: Two times a day (BID) | ORAL | Status: AC
  Administered 2017-07-25 (×2): 500 mg via ORAL
  Filled 2017-07-25 (×2): qty 2

## 2017-07-25 MED ORDER — ACETAMINOPHEN 160 MG/5ML PO SOLN
650.0000 mg | Freq: Four times a day (QID) | ORAL | Status: DC | PRN
  Administered 2017-07-25 – 2017-08-07 (×12): 650 mg
  Filled 2017-07-25 (×13): qty 20.3

## 2017-07-25 MED ORDER — DOCUSATE SODIUM 50 MG/5ML PO LIQD
50.0000 mg | Freq: Two times a day (BID) | ORAL | Status: DC
  Administered 2017-07-25 – 2017-08-07 (×22): 50 mg
  Filled 2017-07-25 (×25): qty 10

## 2017-07-25 MED ORDER — ALPRAZOLAM 0.25 MG PO TABS
0.2500 mg | ORAL_TABLET | Freq: Three times a day (TID) | ORAL | Status: DC | PRN
  Administered 2017-07-25 – 2017-08-17 (×13): 0.25 mg
  Filled 2017-07-25 (×13): qty 1

## 2017-07-25 MED ORDER — THYROID 30 MG PO TABS
30.0000 mg | ORAL_TABLET | Freq: Every day | ORAL | Status: DC
  Administered 2017-07-26 – 2017-08-17 (×21): 30 mg
  Filled 2017-07-25 (×23): qty 1

## 2017-07-25 MED ORDER — PANTOPRAZOLE SODIUM 40 MG PO PACK
40.0000 mg | PACK | Freq: Two times a day (BID) | ORAL | Status: DC
  Administered 2017-07-25 – 2017-08-06 (×23): 40 mg
  Filled 2017-07-25 (×21): qty 20

## 2017-07-25 NOTE — Progress Notes (Signed)
[place] at [place] NAME: Kayla Boyer    MR#:  [id number]  DATE OF BIRTH:  [birth year]  SUBJECTIVE:  Patient is on  BiPAP REVIEW OF SYSTEMS:   Review of system unobtainable as the patient is on BiPAP  Tolerating Diet: soft Tolerating PT: pending  DRUG ALLERGIES:  No Known Allergies  VITALS:  Blood pressure 120/73, pulse (!) 119, temperature (!) 97.2 F (36.2 C), temperature source Axillary, resp. rate (!) 23, height 5' 2.5" (1.588 m), weight 60.6 kg (133 lb 9.6 oz), SpO2 98 %.  PHYSICAL EXAMINATION:   Physical Exam  GENERAL:  62 y.o.-year-old patient lying in the bed with no acute distress.  Appears chronically ill EYES: Pupils equal, round, reactive to light and accommodation. No scleral icterus. Extraocular muscles intact.  HEENT: Head atraumatic, normocephalic. Oropharynx and nasopharynx clear. Drooling  NECK:  Supple, no jugular venous distention. No thyroid enlargement, no tenderness.  LUNGS: decreased breath sounds bilaterally, no wheezing, rales, rhonchi. CARDIOVASCULAR: S1, S2 normal. No murmurs, rubs, or gallops.  ABDOMEN: Soft, nontender, nondistended. Bowel sounds present. No organomegaly or mass.  EXTREMITIES: No cyanosis, clubbing or edema b/l.    NEUROLOGIC: Cranial nerves II through XII are intact. No focal Motor or sensory deficits b/l.   PSYCHIATRIC:  patient is lethargic SKIN: No obvious rash, lesion, or ulcer.   LABORATORY PANEL:  CBC Recent Labs  Lab 07/25/17 0743  WBC 11.5*  HGB 9.8*  HCT 31.2*  PLT 373    Chemistries  Recent Labs  Lab 07/19/17 0316  07/25/17 0743  NA 145   < > 139  K 3.0*   < > 2.8*  CL 94*   < > 89*  CO2 41*   < > 40*  GLUCOSE 92   < > 119*  BUN 9   < > 12  CREATININE <0.30*   < > <0.30*  CALCIUM 9.1   < > 8.4*  MG 1.2*   < > 1.9  AST 20  --   --   ALT 14  --   --   ALKPHOS 40  --   --   BILITOT 1.0  --   --    < > = values in this interval not displayed.    Cardiac Enzymes No results for input(s): TROPONINI in the last 168 hours. RADIOLOGY:  Ct Head Wo Contrast  Result Date: 07/23/2017 CLINICAL DATA:  62 y/o F; changes to left pupil in strength of left arm. EXAM: CT HEAD WITHOUT CONTRAST TECHNIQUE: Contiguous axial images were obtained from the base of the skull through the vertex without intravenous contrast. COMPARISON:  None. FINDINGS: Brain: No evidence of acute infarction, hemorrhage, hydrocephalus, extra-axial collection or mass lesion/mass effect. Vascular: Calcific atherosclerosis of carotid siphons. No hyperdense vessel. Skull: Normal. Negative for fracture or focal lesion. Sinuses/Orbits: No acute finding. Other: None. IMPRESSION: Negative CT of the head. Electronically Signed   By: Kristine Garbe M.D.   On: 07/23/2017 14:33   ASSESSMENT AND PLAN:  Kayla Boyer  is a 62 y.o. female with a known history of gastrointestinal bleeding, gastric ulcer, hyperlipidemia, emphysema of lung was referred by ER physician Dr.malinda for weakness and dark stools.  Patient presented to the emergency room with generalized weakness.  She has noticed a dark black stools for the last few days   * Acute on chronic hypoxic respiratory failure secondary to COPD exacerbation. -Patient has chronic alveolar hypoventilation appears to be due to  impaired ventilatory drive -requiring prn Bipap - currently on high flow o2  -agreeable for trach n PEG, scheduled on Thursday at 9:30 AM by ENT -NG tube feeds as patient is getting malnourished  *Left sided weakness CT head is negative  *Hypophosphatemia Repleting phosphorus is at 3.7 today  *HypokalemiA replete and recheck Magnesium at 1.9 today - * acute on chornic gastrointestinal bleeding. - s/p EGD 3/18 showing Normal duodenal bulb and second portion of the duodenum. Duodenal lipoma. Non-bleeding gastric ulcer with a clean ulcer base (Forrest Class III).Normal gastroesophageal junction and  esophagus. - protonix 40 mg IV bid - hemoglobin 9.7--8.0-- 7.9 --- 7.2 --- 7.1 --6.8 1 unit BT today--9.7--8.7--9.0-9.8 -Given rectal bleed discussed with Dr. Tahiliani--recommending monitoring hemoglobin and transfusion as needed and PPI once patient is stable clinically might consider colonoscopy if continues to have GI bleed  *Symptomatic anemia with weakness PRBC transfusion as needed  *Palliative care met with patient and son.  For now patient is a full code.  D/w dr.Richards  Case discussed with Care Management/Social Worker. Management plans discussed with the patient, Dr Celesta Aver CODE STATUS: Full  DVT Prophylaxis: scd  TOTAL TIME TAKING CARE OF THIS PATIENT: 25 minutes.  >50% time spent on counselling and coordination of care  POSSIBLE D/C IN few  DAYS, DEPENDING ON CLINICAL CONDITION.  Note: This dictation was prepared with Dragon dictation along with smaller phrase technology. Any transcriptional errors that result from this process are unintentional.  Nicholes Mango M.D on 07/25/2017 at 2:20 PM  Between 7am to 6pm - Pager - [phone]  After 6pm go to [email or web address] - password [email or web address]  [facility]  Office  [phone]  CC: Primary care physician; Patient, No Pcp PerPatient ID: Kayla Boyer, female   DOB: Oct 02, 1955, 62 y.o.   MRN: [id number]

## 2017-07-25 NOTE — Progress Notes (Signed)
Daily Progress Note   Patient Name: Kayla Boyer       Date: 07/25/2017 DOB: 1955-12-31  Age: 62 y.o. MRN#: [id number] Attending Physician: Nicholes Mango, MD Primary Care Physician: Patient, [name] Admit Date: 07/14/2017  Reason for Consultation/Follow-up: Establishing goals of care  Subjective: Upon arrival to room, patient awake and on BiPAP. Pointing and asking for BiPAP to come off. States relief from anxiety medication. She tells me trach will be placed on Thursday.   No family at bedside.   Length of Stay: 11  Current Medications: Scheduled Meds:  . chlorhexidine  15 mL Mouth Rinse BID  . sennosides  5 mL Per Tube BID   And  . docusate  50 mg Per Tube BID  . feeding supplement (PRO-STAT SUGAR FREE 64)  30 mL Per Tube BID  . feeding supplement (VITAL 1.5 CAL)  1,000 mL Per Tube Q24H  . ipratropium-albuterol  3 mL Nebulization Q6H  . mouth rinse  15 mL Mouth Rinse q12n4p  . multivitamin  15 mL Per Tube Daily  . pantoprazole sodium  40 mg Per Tube BID  . phosphorus  500 mg Oral BID  . potassium chloride  40 mEq Per Tube BID  . thiamine  100 mg Per Tube Daily  . [START ON 07/26/2017] thyroid  30 mg Per Tube QAC breakfast    Continuous Infusions: . dextrose 50 mL/hr at 07/25/17 1151    PRN Meds: acetaminophen **OR** acetaminophen, ALPRAZolam, bisacodyl, metoprolol tartrate, [DISCONTINUED] ondansetron **OR** ondansetron (ZOFRAN) IV  Physical Exam  Constitutional: She is cooperative. She appears ill.  HENT:  Head: Normocephalic and atraumatic.  Cardiovascular: Regular rhythm.  Pulmonary/Chest:  On BiPAP  Neurological: She is alert.  Skin: Skin is warm and dry.  Psychiatric: She has a normal mood and affect. Her behavior is normal. Her speech is delayed.  Nursing  note and vitals reviewed.          Vital Signs: BP 120/73 (BP Location: Right Arm)   Pulse (!) 119   Temp (!) 97.2 F (36.2 C) (Axillary)   Resp (!) 23   Ht 5' 2.5" (1.588 m)   Wt 60.6 kg (133 lb 9.6 oz)   SpO2 98%   BMI 24.05 kg/m  SpO2: SpO2: 98 % O2 Device: O2 Device: Bi-PAP O2 Flow Rate:  O2 Flow Rate (L/min): 2 L/min  Intake/output summary:   Intake/Output Summary (Last 24 hours) at 07/25/2017 1315 Last data filed at 07/25/2017 1151 Gross per 24 hour  Intake 1662.33 ml  Output 1500 ml  Net 162.33 ml   LBM: Last BM Date: 07/22/17 Baseline Weight: Weight: 60.8 kg (134 lb) Most recent weight: Weight: 60.6 kg (133 lb 9.6 oz)       Palliative Assessment/Data: PPS 30%   Flowsheet Rows     Most Recent Value  Intake Tab  Referral Department  Hospitalist  Unit at Time of Referral  Med/Surg Unit  Palliative Care Primary Diagnosis  Pulmonary  Date Notified  07/19/17  Palliative Care Type  New Palliative care  Reason for referral  Clarify Goals of Care  Date of Admission  07/14/17  Date first seen by Palliative Care  07/21/17  # of days Palliative referral response time  2 Day(s)  # of days IP prior to Palliative referral  5  Clinical Assessment  Psychosocial & Spiritual Assessment  Palliative Care Outcomes      Patient Active Problem List   Diagnosis Date Noted  . Acute on chronic respiratory failure with hypercapnia ([place])   . Protein-calorie malnutrition ([place])   . At high risk for aspiration   . Palliative care by specialist   . Acute respiratory failure ([place])   . Goals of care, counseling/discussion   . Palliative care encounter   . Chronic gastric ulcer without hemorrhage and without perforation   . GI bleed 07/14/2017  . Anemia 06/05/2017  . Fall 10/31/2016  . Tachycardia 10/31/2016  . HLD (hyperlipidemia) 10/31/2016    Palliative Care Assessment & Plan   Patient Profile: 61 y.o. female  with past medical history of recent admission for GIB gastric  ulcers, HLD, COPD admitted on 07/14/2017 with weakness and dark stools. Hospitalization complicated by continued reliance on BiPAP and impaired ventilation as well as GIB. PCCM questioning "low T3 syndrome." Per notes she has been wheelchair bound for ~3 years and with poor appetite and low functional status. CT head negative on 3/24. Unable to tolerate MRI. Patient remains on BiPAP.   Assessment: Acute on chronic hypercapnic respiratory failure secondary to impaired ventilatory drive/muscle weakness Sick Euthyroid UGIB resolved Malnutrition Severe deconditioning ? CVA  Recommendations/Plan:  FULL code/FULL scope.  Pending trach/peg placement.  Goals of Care and Additional Recommendations:  Limitations on Scope of Treatment: Full Scope Treatment  Code Status: FULL   Code Status Orders  (From admission, onward)        Start     Ordered   07/14/17 2115  Full code  Continuous     07/14/17 2122    Code Status History    Date Active Date Inactive Code Status Order ID Comments User Context   06/05/2017 1654 06/08/2017 1820 Full Code [id number]  Bettey Costa, MD Inpatient   11/01/2016 0121 11/09/2016 2044 Full Code [id number]  Lance Coon, MD Inpatient       Prognosis:   Unable to determine guarded with acute on chronic respiratory failure requiring Bipap.  Discharge Planning:  To Be Determined  Care plan was discussed with patient and R  Thank you for allowing the Palliative Medicine Team to assist in the care of this patient.   Time In: 1300 Time Out: 1315 Total Time 66min Prolonged Time Billed no      Greater than 50%  of this time was spent counseling and coordinating care related to the above assessment and  plan.  Ihor Dow, FNP-C Palliative Medicine Team  Phone: [phone] Fax: [phone]  Please contact Palliative Medicine Team phone at [phone] for questions and concerns.

## 2017-07-25 NOTE — Progress Notes (Signed)
Name: Kayla Boyer MRN: [id number] DOB: 1955/09/03    ADMISSION DATE:  07/14/2017  BRIEF PATIENT DESCRIPTION:  40 F hx COPD with recent hosp for GIB due to ulcers presenting with continued drop in Hgb 11 to 9.0.  Pt admitted to stepdown unit 03/15 with acute on chronic hypercapnic hypoxic respiratory failure and acute encephalopathy secondary to AECOPD requiring Bipap.     SIGNIFICANT EVENTS/STUDIES  03/15-Pt admitted to stepdown unit on with acute blood loss anemia, acute encephalopathy, and acute on chronic hypoxic hypercapnic respiratory failure on Bipap  03/17-Pt transferred to [facility] unit 03/18-Pt underwent EGD results revealed normal duodenal bulb and second portion of the  duodenum, duodenal lipoma, non-bleeding gastric ulcer with a clean ulcer base, and normal gastroesophageal junction and esophagus. Rapid response called pt developed acute encephalopathy secondary to acute on chronic hypercapnic respiratory failure likely secondary to sedating medications received during EGD requiring Bipap.  03/19 still requiring intermittent BiPAP 03/20 more awake and alert.  Cognition intact.  Still requiring intermittent BiPAP 03/20 TSH:  0.345 (0.350 - 4.50), free T4: 1.20 (0.61 - 1.12), free T3: 1.3 (2.0 - 4.4) 03/21 still requiring intermittent BiPAP for apparent hypoventilation with lethargy.  3/23 NGT place and nutrition started ( no Duodenal tubes available) 3/24 CT head to evaluate left arm weakness and symmetrical pupils    SUBJECTIVE:  Continues on BiPAP.  She remains awake and reports that her breathing is better.  VITAL SIGNS: Temp:  [97.2 F (36.2 C)-98.8 F (37.1 C)] 97.2 F (36.2 C) (03/26 0738) Pulse Rate:  [83-119] 119 (03/26 1212) Resp:  [17-35] 23 (03/26 1212) BP: (95-139)/(59-90) 120/73 (03/26 0800) SpO2:  [97 %-100 %] 98 % (03/26 1212) FiO2 (%):  [24 %] 24 % (03/26 1212) Weight:  [133 lb 9.6 oz (60.6 kg)] 133 lb 9.6 oz (60.6 kg) (03/26 0438)  PHYSICAL  EXAMINATION: Chronically ill-appearing,  awake and interactive HEENT: NCAT, sclerae white, drooling when off BIPAP Diffusely diminished breath sounds, no wheezes or other adventitious sounds Heart sounds, regular, no Murmurs Obese, soft, nontender Extremities warm with 1+edema Cranial nerves intact but very deaf, left arm weakness , right pupil> left Lower extremities 2/5 bilateral  Recent Labs  Lab 07/23/17 0541 07/24/17 0449 07/25/17 0743  NA 140 144 139  K 4.0 4.9 2.8*  CL 89* 93* 89*  CO2 41* 43* 40*  BUN 12 9 12   CREATININE <0.30* <0.30* <0.30*  GLUCOSE 116* 120* 119*   Recent Labs  Lab 07/21/17 0717 07/23/17 0541 07/25/17 0743  HGB 8.7* 9.0* 9.8*  HCT 27.9* 28.9* 31.2*  WBC 12.0* 14.5* 11.5*  PLT 218 234 373   CXR: Mild bibasilar atelectasis with very small bilateral effusions  ASSESSMENT / PLAN: 1. Acute on chronic hypercapnic respiratory failure due primarily to impaired ventilatory drive and global muscle weakness- dependent on NIPPV - Sick Euthyroid will need repeat when she is well. 2. Moderate Malnutrition- protein and caloric  3. Severe deconditioning with global muscle weakness causing respiratory impairment 3. Possible CVA- left arm weakness, drooling. CT head negative but patient unable to tolerate MRI safely. 4. Hypokalemia, hypophosphatemia   Plan;  1. Continue on NIPPV and HFO to keep SpO2 88-94%  2. Continue on NGT feed with slow increase to avoid refeeding syndrome, electrolytes replacement 3.  ENT planning for trach on thursday 4.  Peg tube placement possibly on Thursday by IR 5.  Care manager consult for placement  Family: Son updated    Cammie Sickle, MD PCCM service Pager  [phone] 07/25/2017 1:08 PM

## 2017-07-25 NOTE — H&P (Signed)
Chief Complaint: Declining functional status  Malnutrition  Referring Physician(s): Lafayette Dragon  Supervising Physician: Marybelle Killings  Patient Status: [facility] - In-pt  History of Present Illness: Kayla Boyer is a 62 y.o. female  who was admitted on 07/14/2016 with weakness and dark stools  Medical issues include COPD and hyperlipidemia.  Hospitalization has been complicated by continued reliance on BiPAP and impaired ventilation.  Per notes, she has been wheelchair bound for ~3 years and with poor appetite and low functional status.  Endoscopy for GI bleed done 07/17/2017 showed one non-bleeding cratered gastric ulcer with clean ulcer base at the lesser curvature of the gastric antrum.  Tracheostomy is planned for Thursday.  She currently has NGT feedings.  We are asked to evaluate her for gastrostomy tube placement.  Past Medical History:  Diagnosis Date  . Emphysema of lung ([place])   . GI bleed   . HLD (hyperlipidemia)     Past Surgical History:  Procedure Laterality Date  . ESOPHAGOGASTRODUODENOSCOPY (EGD) WITH PROPOFOL N/A 07/17/2017   Procedure: ESOPHAGOGASTRODUODENOSCOPY (EGD) WITH PROPOFOL;  Surgeon: Lin Landsman, MD;  Location: [facility];  Service: Gastroenterology;  Laterality: N/A;  . NO PAST SURGERIES    . OOPHORECTOMY Left     Allergies: Patient has no known allergies.  Medications: Prior to Admission medications   Medication Sig Start Date End Date Taking? Authorizing Provider  feeding supplement, ENSURE ENLIVE, (ENSURE ENLIVE) LIQD Take 237 mLs by mouth 2 (two) times daily between meals. 06/08/17   Vaughan Basta, MD  ferrous sulfate 325 (65 FE) MG tablet Take 1 tablet (325 mg total) by mouth 2 (two) times daily with a meal. 06/08/17   Vaughan Basta, MD  pantoprazole (PROTONIX) 40 MG tablet Take 1 tablet (40 mg total) by mouth 2 (two) times daily before a meal. 06/08/17   Vaughan Basta, MD  vitamin B-12  (CYANOCOBALAMIN) 1000 MCG tablet Take 1,000 mcg by mouth daily.    [provider]  vitamin C (ASCORBIC ACID) 500 MG tablet Take 500 mg by mouth daily.    [provider]     Family History  Family history unknown: Yes    Social History   Socioeconomic History  . Marital status: Married    Spouse name: Not on file  . Number of children: Not on file  . Years of education: Not on file  . Highest education level: Not on file  Occupational History  . Not on file  Social Needs  . Financial resource strain: Not on file  . Food insecurity:    Worry: Not on file    Inability: Not on file  . Transportation needs:    Medical: Not on file    Non-medical: Not on file  Tobacco Use  . Smoking status: Never Smoker  . Smokeless tobacco: Never Used  Substance and Sexual Activity  . Alcohol use: No    Frequency: Never  . Drug use: No  . Sexual activity: Not on file  Lifestyle  . Physical activity:    Days per week: Not on file    Minutes per session: Not on file  . Stress: Not on file  Relationships  . Social connections:    Talks on phone: Not on file    Gets together: Not on file    Attends religious service: Not on file    Active member of club or organization: Not on file    Attends meetings of clubs or organizations: Not on file  Relationship status: Not on file  Other Topics Concern  . Not on file  Social History Narrative  . Not on file    Review of Systems  Unable to perform ROS: Other  Patient on continuous BiPAP  Vital Signs: BP 120/73 (BP Location: Right Arm)   Pulse 83   Temp (!) 97.2 F (36.2 C) (Axillary)   Resp 17   Ht 5' 2.5" (1.588 m)   Wt 133 lb 9.6 oz (60.6 kg)   SpO2 100%   BMI 24.05 kg/m   Physical Exam  Constitutional: She is oriented to person, place, and time. She appears well-developed.  HENT:  Head: Normocephalic and atraumatic.  Eyes: EOM are normal.  Neck: Normal range of motion.  Cardiovascular: Normal rate,  regular rhythm and normal heart sounds.  Pulmonary/Chest:  On continuous BiPAP  Abdominal: Soft. She exhibits no distension. There is no tenderness.  Neurological: She is alert and oriented to person, place, and time.  Skin: Skin is warm and dry.  Psychiatric: Judgment and thought content normal.  Vitals reviewed.   Imaging: Dg Abd 1 View  Result Date: 07/22/2017 CLINICAL DATA:  Encounter for feeding tube placement. EXAM: ABDOMEN - 1 VIEW COMPARISON:  Chest radiograph 07/22/2017 FINDINGS: A nasogastric tube has been advanced into the abdomen. Catheter tip is in the distal stomach region. Bowel gas throughout the abdomen. Persistent densities at the left lung base and the left hemidiaphragm remains obscured. IMPRESSION: Feeding tube tip in the distal stomach region. Electronically Signed   By: Markus Daft M.D.   On: 07/22/2017 16:30   Ct Head Wo Contrast  Result Date: 07/23/2017 CLINICAL DATA:  62 y/o F; changes to left pupil in strength of left arm. EXAM: CT HEAD WITHOUT CONTRAST TECHNIQUE: Contiguous axial images were obtained from the base of the skull through the vertex without intravenous contrast. COMPARISON:  None. FINDINGS: Brain: No evidence of acute infarction, hemorrhage, hydrocephalus, extra-axial collection or mass lesion/mass effect. Vascular: Calcific atherosclerosis of carotid siphons. No hyperdense vessel. Skull: Normal. Negative for fracture or focal lesion. Sinuses/Orbits: No acute finding. Other: None. IMPRESSION: Negative CT of the head. Electronically Signed   By: Kristine Garbe M.D.   On: 07/23/2017 14:33   Nm Gi Blood Loss  Result Date: 07/20/2017 CLINICAL DATA:  GI bleeding EXAM: NUCLEAR MEDICINE GASTROINTESTINAL BLEEDING SCAN TECHNIQUE: Sequential abdominal images were obtained following intravenous administration of Tc-45m labeled red blood cells. RADIOPHARMACEUTICALS:  Twenty mCi Tc-11m pertechnetate in-vitro labeled red cells. COMPARISON:  None. FINDINGS:  Normal distribution of radiotracer is noted. No focal area of tracer accumulation is identified to suggest active GI bleeding is noted. IMPRESSION: No findings to suggest active GI hemorrhage. Electronically Signed   By: Inez Catalina M.D.   On: 07/20/2017 15:16   US Venous Img Upper Bilat  Result Date: 07/16/2017 CLINICAL DATA:  Bilateral upper extremity edema acutely with pain and discomfort for 1 day EXAM: BILATERAL UPPER EXTREMITY VENOUS DOPPLER ULTRASOUND TECHNIQUE: Gray-scale sonography with graded compression, as well as color Doppler and duplex ultrasound were performed to evaluate the bilateral upper extremity deep venous systems from the level of the subclavian vein and including the jugular, axillary, basilic, radial, ulnar and upper cephalic vein. Spectral Doppler was utilized to evaluate flow at rest and with distal augmentation maneuvers. COMPARISON:  None. FINDINGS: Exam is limited because of upper extremity subcutaneous edema. RIGHT UPPER EXTREMITY Internal Jugular Vein: No evidence of thrombus. Normal compressibility, respiratory phasicity and response to augmentation. Subclavian Vein: No evidence  of thrombus. Normal compressibility, respiratory phasicity and response to augmentation. Axillary Vein: No evidence of thrombus. Normal compressibility, respiratory phasicity and response to augmentation. Cephalic Vein: No evidence of thrombus. Normal compressibility, respiratory phasicity and response to augmentation. Basilic Vein: Not well visualized to accurately assess. Brachial Veins: No evidence of thrombus. Normal compressibility, respiratory phasicity and response to augmentation. Radial Veins: No evidence of thrombus. Normal compressibility, respiratory phasicity and response to augmentation. Ulnar Veins: No evidence of thrombus. Normal compressibility, respiratory phasicity and response to augmentation. Venous Reflux:  None. Other Findings:  Peripheral edema noted LEFT UPPER EXTREMITY Internal  Jugular Vein: No evidence of thrombus. Normal compressibility, respiratory phasicity and response to augmentation. Subclavian Vein: No evidence of thrombus. Normal compressibility, respiratory phasicity and response to augmentation. Axillary Vein: No evidence of thrombus. Normal compressibility, respiratory phasicity and response to augmentation. Cephalic Vein: No evidence of thrombus. Normal compressibility, respiratory phasicity and response to augmentation. Basilic Vein: No evidence of thrombus. Normal compressibility, respiratory phasicity and response to augmentation. Brachial Veins: No evidence of thrombus. Normal compressibility, respiratory phasicity and response to augmentation. Radial Veins: No evidence of thrombus. Normal compressibility, respiratory phasicity and response to augmentation. Ulnar Veins: No evidence of thrombus. Normal compressibility, respiratory phasicity and response to augmentation. Venous Reflux:  None. Other Findings:  But peripheral subcutaneous edema noted. IMPRESSION: Limited exam but no gross occlusive upper extremity DVT in either arm. Electronically Signed   By: Jerilynn Mages.  Shick M.D.   On: 07/16/2017 12:34   Dg Chest Port 1 View  Result Date: 07/23/2017 CLINICAL DATA:  Acute on chronic respiratory failure. Airway aspiration. EXAM: PORTABLE CHEST 1 VIEW COMPARISON:  07/22/2017. FINDINGS: Interval enlargement of the cardiac silhouette. Stable dense left lower lobe airspace opacity. Interval increased linear and patchy density at the right lung base. Stable small bilateral pleural effusions. Nasogastric tube extending into the stomach. Thoracic spine degenerative changes. IMPRESSION: 1. No significant change in dense left lower lobe atelectasis or pneumonia and small left pleural effusion. 2. Increased right basilar atelectasis or pneumonia with a stable small right pleural effusion. Electronically Signed   By: Claudie Revering M.D.   On: 07/23/2017 16:09   Dg Chest Port 1  View  Result Date: 07/22/2017 CLINICAL DATA:  Respiratory failure.  COPD. EXAM: PORTABLE CHEST 1 VIEW COMPARISON:  07/19/2017. FINDINGS: The cardiac silhouette remains borderline enlarged. Stable left pleural effusion and left basilar airspace opacity. Small right pleural effusion, decreased, with decreased adjacent right basilar atelectasis. Thoracic spine degenerative changes. IMPRESSION: 1. Stable left pleural effusion and left lower lobe atelectasis or pneumonia. 2. Decreased right pleural fluid and right basilar atelectasis. Electronically Signed   By: Claudie Revering M.D.   On: 07/22/2017 09:14   Dg Chest Port 1 View  Result Date: 07/19/2017 CLINICAL DATA:  Respiratory failure EXAM: PORTABLE CHEST 1 VIEW COMPARISON:  July 15, 2017 FINDINGS: There are small pleural effusions bilaterally. There is consolidation in the left lower lobe. Lungs elsewhere are clear. Heart size is upper normal with pulmonary vascularity within normal limits. No adenopathy. There is degenerative change in the thoracic spine. IMPRESSION: Left lower lobe consolidation with small pleural effusions bilaterally. There is mild bibasilar atelectasis. Stable cardiac silhouette. Electronically Signed   By: Lowella Grip III M.D.   On: 07/19/2017 07:57   Dg Chest Port 1 View  Result Date: 07/15/2017 CLINICAL DATA:  Acute respiratory failure. EXAM: PORTABLE CHEST 1 VIEW COMPARISON:  Chest x-ray from yesterday FINDINGS: The heart size and mediastinal contours are within normal  limits. Normal pulmonary vascularity. Unchanged left greater than right bibasilar atelectasis and scarring at the costophrenic angles. No focal consolidation, pleural effusion, or pneumothorax. No acute osseous abnormality. IMPRESSION: Stable bibasilar atelectasis/scarring. Electronically Signed   By: Titus Dubin M.D.   On: 07/15/2017 10:41   Dg Chest Portable 1 View  Result Date: 07/14/2017 CLINICAL DATA:  Shortness of breath. EXAM: PORTABLE CHEST 1 VIEW  COMPARISON:  06/05/2017. FINDINGS: Mediastinum and hilar structures normal. Stable mild cardiomegaly with normal pulmonary vascularity. Low lung volumes with mild basilar atelectasis. Stable bilateral pleural thickening consistent scarring. Degenerative changes thoracic spine. IMPRESSION: Low lung volumes with mild basilar atelectasis and/or pleuroparenchymal scarring again noted. Stable mild cardiomegaly. Chest is stable from prior exam. Electronically Signed   By: Marcello Moores  Register   On: 07/14/2017 17:13   Ct Renal Stone Study  Result Date: 07/04/2017 CLINICAL DATA:  62 year old female with blood on pad. Question rectal bleeding, vaginal bleeding or blood in urine. Initial encounter. EXAM: CT ABDOMEN AND PELVIS WITHOUT CONTRAST TECHNIQUE: Multidetector CT imaging of the abdomen and pelvis was performed following the standard protocol without IV contrast. COMPARISON:  06/05/2017 CT. FINDINGS: Lower chest: Bibasilar atelectasis greater left once again noted. Hepatobiliary: Multiple gallstones. No CT evidence of surrounding inflammation. If patient had right upper quadrant tenderness, ultrasound can be obtained for further delineation. Taking into account limitation by non contrast imaging, no worrisome liver lesion. Pancreas: Taking into account limitation by non contrast imaging, no worrisome pancreatic lesion or inflammation. Spleen: Taking into account limitation by non contrast imaging, no worrisome splenic lesion or enlargement. Adrenals/Urinary Tract: When compared to the recent CT, there is mild fullness of the right renal collecting system without obstructing stone identified. This may be related to recently passed stone. Bilateral tiny/small nonobstructing renal calculi are noted (largest left lower pole measures 3.5 mm). Taking into account limitation by non contrast imaging, no worrisome renal or adrenal lesion. Noncontrast filled views of the urinary bladder unremarkable. Stomach/Bowel: Moderate stool  rectosigmoid region. Superior extension sigmoid colon. No extraluminal bowel inflammatory process noted. Under distended stomach without gross abnormality. Tiny lipoma third portion of the duodenum. Vascular/Lymphatic: Aortic and iliac artery calcification. No aneurysm. No adenopathy. Reproductive: Retroverted uterus.  No adnexal mass. Other: No free intraperitoneal air or bowel containing hernia. Musculoskeletal: No worrisome osseous abnormality. IMPRESSION: When compared to recent CT, there is mild fullness of the right renal collecting system without obstructing stone identified. This may be related to recently passed stone or blood clot. Bilateral tiny/small nonobstructing renal calculi. Multiple gallstones. Bibasilar atelectasis greater left once again noted. Moderate stool rectosigmoid region. Superior extension sigmoid colon. No extraluminal bowel inflammatory process noted. Tiny lipoma third portion of the duodenum. Aortic Atherosclerosis (ICD10-I70.0). Electronically Signed   By: Genia Del M.D.   On: 07/04/2017 08:03    Labs:  CBC: Recent Labs    07/20/17 0425 07/21/17 0717 07/23/17 0541 07/25/17 0743  WBC 15.5* 12.0* 14.5* 11.5*  HGB 9.7* 8.7* 9.0* 9.8*  HCT 31.4* 27.9* 28.9* 31.2*  PLT 200 218 234 373    COAGS: Recent Labs    07/14/17 1802 07/25/17 0743  INR 0.81 0.93  APTT 32  --     BMP: Recent Labs    07/22/17 0527 07/23/17 0541 07/24/17 0449 07/25/17 0743  NA 144 140 144 139  K 4.5 4.0 4.9 2.8*  CL 93* 89* 93* 89*  CO2 42* 41* 43* 40*  GLUCOSE 92 116* 120* 119*  BUN 9 12 9  12  CALCIUM 8.9 8.1* 8.4* 8.4*  CREATININE <0.30* <0.30* <0.30* <0.30*  GFRNONAA NOT CALCULATED NOT CALCULATED NOT CALCULATED NOT CALCULATED  GFRAA NOT CALCULATED NOT CALCULATED NOT CALCULATED NOT CALCULATED    LIVER FUNCTION TESTS: Recent Labs    06/05/17 1313 07/04/17 0635 07/14/17 1652 07/19/17 0316  BILITOT 0.3 0.5 0.5 1.0  AST 16 21 17 20   ALT 9* 18 13* 14  ALKPHOS 38  58 48 40  PROT 6.4* 7.7 7.5 5.7*  ALBUMIN 3.0* 3.6 3.6 2.5*    TUMOR MARKERS: No results for input(s): AFPTM, CEA, CA199, CHROMGRNA in the last 8760 hours.  Assessment and Plan:  Recent GI bleed   Endoscopy showed one Non-bleeding cratered gastric ulcer with clean ulcer base at the lesser curvature of the gastric antrum.  CT scan from 06/05/17 was reviewed by Dr. Barbie Banner.  Anatomically she is a good candidate for gastrostomy tube placement.  Will plan for this after tracheostomy has been placed.  She will need tube feeds held at [place] the night before planned procedure.  Will add to our schedule on Friday.  Thank you for this interesting consult.  I greatly enjoyed meeting [facility] and look forward to participating in their care.  A copy of this report was sent to the requesting provider on this date.  Electronically Signed: Murrell Redden, PA-C 07/25/2017, 9:06 AM   I spent a total of 40 Minutes in face to face in clinical consultation, greater than 50% of which was counseling/coordinating care for gtube placement.

## 2017-07-25 NOTE — Progress Notes (Addendum)
Pharmacy Electrolyte Monitoring Consult:  Pharmacy consulted to assist in monitoring and replacing electrolytes in this 62 y.o. female admitted on 07/14/2017 with Weakness   Labs:  Sodium (mmol/L)  Date Value  07/25/2017 139   Potassium (mmol/L)  Date Value  07/25/2017 2.8 (L)   Magnesium (mg/dL)  Date Value  07/25/2017 1.9   Phosphorus (mg/dL)  Date Value  07/25/2017 2.4 (L)   Calcium (mg/dL)  Date Value  07/25/2017 8.4 (L)   Albumin (g/dL)  Date Value  07/19/2017 2.5 (L)    Plan: Advanced feeds started 3/25.  3/26: K 2.8 and Ph 2.4 Will replace today with KCl 49mEq IV x4 and KPhos 500mg  tablet x2.   Pharmacy to continue to monitor and replace electrolytes per protocol.   Update: Patient not tolerating IV KCl. Will stop KCl IV and change to KCL 60mEq powder x2 doses.  Will recheck K today @ 2000.  Candelaria Stagers, PharmD Pharmacy Resident  07/25/2017 8:53 AM

## 2017-07-25 NOTE — Progress Notes (Signed)
Peripherally Inserted Central Catheter/Midline Placement  The IV Nurse has discussed with the patient and/or persons authorized to consent for the patient, the purpose of this procedure and the potential benefits and risks involved with this procedure.  The benefits include less needle sticks, lab draws from the catheter, and the patient may be discharged home with the catheter. Risks include, but not limited to, infection, bleeding, blood clot (thrombus formation), and puncture of an artery; nerve damage and irregular heartbeat and possibility to perform a PICC exchange if needed/ordered by physician.  Alternatives to this procedure were also discussed.  Bard Power PICC patient education guide, fact sheet on infection prevention and patient information card has been provided to patient /or left at bedside.    PICC/Midline Placement Documentation  PICC Single Lumen 07/25/17 PICC Right Brachial 32 cm 0 cm (Active)  Indication for Insertion or Continuance of Line Prolonged intravenous therapies 07/25/2017  9:18 PM  Exposed Catheter (cm) 0 cm 07/25/2017  9:18 PM  Site Assessment Clean;Dry;Intact 07/25/2017  9:18 PM  Line Status Flushed;Saline locked;Blood return noted 07/25/2017  9:18 PM  Dressing Type Transparent 07/25/2017  9:18 PM  Dressing Status Clean;Dry;Intact;Antimicrobial disc in place 07/25/2017  9:18 PM  Dressing Change Due 08/01/17 07/25/2017  9:18 PM       Helem Reesor, Nicolette Bang 07/25/2017, 9:19 PM

## 2017-07-25 NOTE — Progress Notes (Signed)
OT Cancellation Note  Patient Details Name: Kayla Boyer MRN: [id number] DOB: 02-09-1956   Cancelled Treatment:    Reason Eval/Treat Not Completed: Patient not medically ready. Order received, chart reviewed. Pt remains on BiPAP. Continues to be inappropriate for OT evaluation while on BiPAP. Of note, pt scheduled for tracheostomy Thursday am 3/28. Will hold OT evaluation at this time and continue to follow and re-attempt as pt is medically appropriate.   Jeni Salles, MPH, MS, OTR/L ascom [phone] 07/25/17, 1:39 PM

## 2017-07-25 NOTE — Clinical Social Work Note (Signed)
Clinical Social Work Assessment  Patient Details  Name: Kayla Boyer MRN: [id number] Date of Birth: [birth year]  Date of referral:  07/25/17               Reason for consult:  Discharge Planning                Permission sought to share information with:  Facility Sport and exercise psychologist, Family Supports Permission granted to share information::  (patient unable to speak but was able to nod and shake her head and write on paper )  Name::        Agency::     Relationship::     Contact Information:     Housing/Transportation Living arrangements for the past 2 months:  Single Family Home Source of Information:  Patient, Spouse, Adult Children Patient Interpreter Needed:  None Criminal Activity/Legal Involvement Pertinent to Current Situation/Hospitalization:  No - Comment as needed Significant Relationships:  Adult Children, Spouse, Siblings Lives with:  Spouse Do you feel safe going back to the place where you live?  Yes Need for family participation in patient care:  Yes (Comment)  Care giving concerns:  Patient resides at home with her husband.   Social Worker assessment / plan:  CSW received consult for placement today. Patient is scheduled for a trach and peg on Thursday and is currently on continuous bipap. Patient is uninsured.   CSW contacted [place], Lizbeth Bark: [phone] to determine if she had been following patient. Ms. Alveta Heimlich stated that she has been working with this family since last October. Ms Alveta Heimlich stated that she has informed the patient's husband Kayla Boyer: [phone])) and patient's son Kayla Boyer: [phone]) that the only thing she needs from the family in order to complete the medicaid application are the bank statements for July, Aug, and September. Ms. Alveta Heimlich stated she spoke with son on Friday and that she had concerns when she spoke with the husband that he may have some cognitive deficits.   CSW met with the patient, her husband,  and her sister this afternoon at bedside. CSW explained role and purpose of visit. CSW reviewed documentation from Palliative Care and patient wishes to have aggressive care and that she is able to communicate via writing. CSW encouraged patient's husband and sister to assist with getting those bank statements. CSW discussed potential needs at discharge and that patient's care may be too complex initially for patient to return home right away and that she may need a higher level of care. Patient then asked for clarification by writing "I don't get to go home?" CSW discussed this with her and let her know that this would definitely be the long term goal but more than likely will not be the short term goal. Patient nodded. CSW informed patient that I would also contact her son and she nodded her head as well to this.   CSW contacted patient's son via phone and reiterated the importance of obtaining the bank statements. Patient's son is not sure if anyone other than his mother are on her accounts. CSW has left message with Ms. Alveta Heimlich to see what can be done if bank statements cannot be obtained. Patient's son is now aware that a higher level of care will be needed at discharge and that the medicaid is needed in order to facilitate this. It is going to be difficult to find placement for someone who requires a trach and a peg and continuous bipap with no insurance. Patient's son verbalized  understanding.   It will be important to continually check with the family regarding the status of finding the bank statements needed to complete the medicaid application.   Employment status:  Unemployed Forensic scientist:  Self Pay (Medicaid Pending) PT Recommendations:    Information / Referral to community resources:     Patient/Family's Response to care:  Patient's family expressed appreciation for CSW assistance.  Patient/Family's Understanding of and Emotional Response to Diagnosis, Current Treatment, and  Prognosis:  Patient is aware currently of her severe limitations at this time.  Emotional Assessment Appearance:  Appears stated age Attitude/Demeanor/Rapport:  (cooperative) Affect (typically observed):  Accepting, Adaptable, Calm Orientation:  Oriented to Self, Oriented to Place, Oriented to Situation, Oriented to  Time Alcohol / Substance use:  Not Applicable Psych involvement (Current and /or in the community):  No (Comment)  Discharge Needs  Concerns to be addressed:  Care Coordination Readmission within the last 30 days:  No Current discharge risk:  None Barriers to Discharge:  Inadequate or no insurance, Continued Medical Work up   [employer], [place] 07/25/2017, 3:41 PM

## 2017-07-26 DIAGNOSIS — Z431 Encounter for attention to gastrostomy: Secondary | ICD-10-CM

## 2017-07-26 LAB — GLUCOSE, CAPILLARY
GLUCOSE-CAPILLARY: 111 mg/dL — AB (ref 65–99)
GLUCOSE-CAPILLARY: 126 mg/dL — AB (ref 65–99)
GLUCOSE-CAPILLARY: 111 mg/dL — AB (ref 65–99)
Glucose-Capillary: 106 mg/dL — ABNORMAL HIGH (ref 65–99)
Glucose-Capillary: 116 mg/dL — ABNORMAL HIGH (ref 65–99)
Glucose-Capillary: 140 mg/dL — ABNORMAL HIGH (ref 65–99)

## 2017-07-26 LAB — CBC WITH DIFFERENTIAL/PLATELET
BASOS ABS: 0 10*3/uL (ref 0–0.1)
BASOS PCT: 0 %
Eosinophils Absolute: 0.3 10*3/uL (ref 0–0.7)
Eosinophils Relative: 3 %
HCT: 26 % — ABNORMAL LOW (ref 35.0–47.0)
Hemoglobin: 8.7 g/dL — ABNORMAL LOW (ref 12.0–16.0)
Lymphocytes Relative: 4 %
Lymphs Abs: 0.3 10*3/uL — ABNORMAL LOW (ref 1.0–3.6)
MCH: 30.4 pg (ref 26.0–34.0)
MCHC: 33.3 g/dL (ref 32.0–36.0)
MCV: 91.3 fL (ref 80.0–100.0)
MONO ABS: 0.9 10*3/uL (ref 0.2–0.9)
MONOS PCT: 10 %
Neutro Abs: 7.7 10*3/uL — ABNORMAL HIGH (ref 1.4–6.5)
Neutrophils Relative %: 83 %
Platelets: 361 10*3/uL (ref 150–440)
RBC: 2.85 MIL/uL — ABNORMAL LOW (ref 3.80–5.20)
RDW: 17.4 % — AB (ref 11.5–14.5)
WBC: 9.3 10*3/uL (ref 3.6–11.0)

## 2017-07-26 LAB — BLOOD GAS, ARTERIAL
Bicarbonate: UNDETERMINED mmol/L (ref 20.0–28.0)
Bicarbonate: UNDETERMINED mmol/L (ref 20.0–28.0)
DELIVERY SYSTEMS: POSITIVE
Expiratory PAP: 5
FIO2: 0.32
FIO2: 0.3
Inspiratory PAP: 18
O2 Saturation: UNDETERMINED %
PATIENT TEMPERATURE: 37
PATIENT TEMPERATURE: 37
PCO2 ART: 120 mmHg — AB (ref 32.0–48.0)
pCO2 arterial: 120 mmHg (ref 32.0–48.0)
pH, Arterial: 7.25 — ABNORMAL LOW (ref 7.350–7.450)
pH, Arterial: 7.16 — CL (ref 7.350–7.450)
pO2, Arterial: 70 mmHg — ABNORMAL LOW (ref 83.0–108.0)
pO2, Arterial: 89 mmHg (ref 83.0–108.0)

## 2017-07-26 LAB — RENAL FUNCTION PANEL
ALBUMIN: 2.5 g/dL — AB (ref 3.5–5.0)
Anion gap: 7 (ref 5–15)
BUN: 9 mg/dL (ref 6–20)
CALCIUM: 8.5 mg/dL — AB (ref 8.9–10.3)
CO2: 39 mmol/L — ABNORMAL HIGH (ref 22–32)
Chloride: 95 mmol/L — ABNORMAL LOW (ref 101–111)
Creatinine, Ser: <0.3 mg/dL — ABNORMAL LOW (ref 0.44–1.00)
GLUCOSE: 127 mg/dL — AB (ref 65–99)
PHOSPHORUS: 3.4 mg/dL (ref 2.5–4.6)
POTASSIUM: 4.4 mmol/L (ref 3.5–5.1)
SODIUM: 141 mmol/L (ref 135–145)

## 2017-07-26 LAB — MAGNESIUM: Magnesium: 1.7 mg/dL (ref 1.7–2.4)

## 2017-07-26 NOTE — Care Management (Signed)
RNCM spoke with ICU RN and asked her to ask patient's son if he has been able to obtain bank statements for Medicaid application. She agreed. Patient is not working with therapy per RN.

## 2017-07-26 NOTE — Progress Notes (Addendum)
[place] at [facility]                                                                                                                                                                                  Patient Demographics   Kayla Boyer, is a 62 y.o. female, DOB - August 09, 1955, XBJ:[id number]  Admit date - 07/14/2017   Admitting Physician Saundra Shelling, MD  Outpatient Primary MD for the patient is Patient, [name]   LOS - 12  Subjective: Patient seen and evaluated she is on BiPAP IPAP 20 EPAP 8 Rate 12 5 oh 28% Has generalized weakness Decreased shortness of breath  Review of Systems:  On BIPAP Complete review of systems is not possible as patient is lethargic Opens eyes and communicate some verbal commands  Vitals:   Vitals:   07/26/17 1100 07/26/17 1200 07/26/17 1300 07/26/17 1400  BP: 105/78 117/67 122/71 (!) 100/46  Pulse: 92 95 86 93  Resp:      Temp:  98 F (36.7 C)    TempSrc:  Axillary    SpO2: 100% 100% 100% 100%  Weight:      Height:        Wt Readings from Last 3 Encounters:  07/26/17 60 kg (132 lb 4.4 oz)  07/05/17 60.8 kg (134 lb)  07/04/17 60.8 kg (134 lb)     Intake/Output Summary (Last 24 hours) at 07/26/2017 1614 Last data filed at 07/26/2017 1400 Gross per 24 hour  Intake 2790 ml  Output 675 ml  Net 2115 ml    Physical Exam:   GENERAL: 62 year old female patient lying on the bed in the stepdown unit on BiPAP HEAD, EYES, EARS, NOSE AND THROAT: Atraumatic, normocephalic. Extraocular muscles are intact. Pupils equal and reactive to light. Sclerae anicteric. No conjunctival injection. No oro-pharyngeal erythema.  NECK: Supple. There is no jugular venous distention. No bruits, no lymphadenopathy, no thyromegaly.  HEART: Regular rate and rhythm,. No murmurs, no rubs, no clicks.  LUNGS: Clear to auscultation bilaterally. No rales or rhonchi. No wheezes.  ABDOMEN: Soft, flat, nontender, nondistended. Has good bowel  sounds. No hepatosplenomegaly appreciated.  EXTREMITIES: No evidence of any cyanosis, clubbing.  +2 pedal and radial pulses bilaterally.  1+ pedal edema NEUROLOGIC: The patient is alert, arousable, and oriented x2 with   Decreased hearing, left arm weakness Right pupil greater than left pupil SKIN: Moist and warm with no rashes appreciated.  Psych: Not be assessed LN: No inguinal LN enlargement    Antibiotics   Anti-infectives (From admission, onward)   Start     Dose/Rate Route Frequency Ordered Stop   07/14/17 2200  azithromycin (ZITHROMAX) 500  mg in sodium chloride 0.9 % 250 mL IVPB  Status:  Discontinued     500 mg 250 mL/hr over 60 Minutes Intravenous Every 24 hours 07/14/17 2122 07/18/17 0915      Medications   Scheduled Meds: . chlorhexidine  15 mL Mouth Rinse BID  . sennosides  5 mL Per Tube BID   And  . docusate  50 mg Per Tube BID  . feeding supplement (PRO-STAT SUGAR FREE 64)  30 mL Per Tube BID  . feeding supplement (VITAL 1.5 CAL)  1,000 mL Per Tube Q24H  . ipratropium-albuterol  3 mL Nebulization Q6H  . mouth rinse  15 mL Mouth Rinse q12n4p  . multivitamin  15 mL Per Tube Daily  . pantoprazole sodium  40 mg Per Tube BID  . sodium chloride flush  10-40 mL Intracatheter Q12H  . thiamine  100 mg Per Tube Daily  . thyroid  30 mg Per Tube QAC breakfast   Continuous Infusions: . dextrose 50 mL/hr at 07/26/17 1400   PRN Meds:.acetaminophen (TYLENOL) oral liquid 160 mg/5 mL, acetaminophen **OR** acetaminophen, ALPRAZolam, bisacodyl, metoprolol tartrate, [DISCONTINUED] ondansetron **OR** ondansetron (ZOFRAN) IV, sodium chloride flush   Data Review:   Micro Results No results found for this or any previous visit (from the past 240 hour(s)).  Radiology Reports Dg Abd 1 View  Result Date: 07/22/2017 CLINICAL DATA:  Encounter for feeding tube placement. EXAM: ABDOMEN - 1 VIEW COMPARISON:  Chest radiograph 07/22/2017 FINDINGS: A nasogastric tube has been advanced  into the abdomen. Catheter tip is in the distal stomach region. Bowel gas throughout the abdomen. Persistent densities at the left lung base and the left hemidiaphragm remains obscured. IMPRESSION: Feeding tube tip in the distal stomach region. Electronically Signed   By: Markus Daft M.D.   On: 07/22/2017 16:30   Ct Head Wo Contrast  Result Date: 07/23/2017 CLINICAL DATA:  62 y/o F; changes to left pupil in strength of left arm. EXAM: CT HEAD WITHOUT CONTRAST TECHNIQUE: Contiguous axial images were obtained from the base of the skull through the vertex without intravenous contrast. COMPARISON:  None. FINDINGS: Brain: No evidence of acute infarction, hemorrhage, hydrocephalus, extra-axial collection or mass lesion/mass effect. Vascular: Calcific atherosclerosis of carotid siphons. No hyperdense vessel. Skull: Normal. Negative for fracture or focal lesion. Sinuses/Orbits: No acute finding. Other: None. IMPRESSION: Negative CT of the head. Electronically Signed   By: Kristine Garbe M.D.   On: 07/23/2017 14:33   Nm Gi Blood Loss  Result Date: 07/20/2017 CLINICAL DATA:  GI bleeding EXAM: NUCLEAR MEDICINE GASTROINTESTINAL BLEEDING SCAN TECHNIQUE: Sequential abdominal images were obtained following intravenous administration of Tc-73m labeled red blood cells. RADIOPHARMACEUTICALS:  Twenty mCi Tc-27m pertechnetate in-vitro labeled red cells. COMPARISON:  None. FINDINGS: Normal distribution of radiotracer is noted. No focal area of tracer accumulation is identified to suggest active GI bleeding is noted. IMPRESSION: No findings to suggest active GI hemorrhage. Electronically Signed   By: Inez Catalina M.D.   On: 07/20/2017 15:16   US Venous Img Upper Bilat  Result Date: 07/16/2017 CLINICAL DATA:  Bilateral upper extremity edema acutely with pain and discomfort for 1 day EXAM: BILATERAL UPPER EXTREMITY VENOUS DOPPLER ULTRASOUND TECHNIQUE: Gray-scale sonography with graded compression, as well as color  Doppler and duplex ultrasound were performed to evaluate the bilateral upper extremity deep venous systems from the level of the subclavian vein and including the jugular, axillary, basilic, radial, ulnar and upper cephalic vein. Spectral Doppler was utilized to evaluate flow at  rest and with distal augmentation maneuvers. COMPARISON:  None. FINDINGS: Exam is limited because of upper extremity subcutaneous edema. RIGHT UPPER EXTREMITY Internal Jugular Vein: No evidence of thrombus. Normal compressibility, respiratory phasicity and response to augmentation. Subclavian Vein: No evidence of thrombus. Normal compressibility, respiratory phasicity and response to augmentation. Axillary Vein: No evidence of thrombus. Normal compressibility, respiratory phasicity and response to augmentation. Cephalic Vein: No evidence of thrombus. Normal compressibility, respiratory phasicity and response to augmentation. Basilic Vein: Not well visualized to accurately assess. Brachial Veins: No evidence of thrombus. Normal compressibility, respiratory phasicity and response to augmentation. Radial Veins: No evidence of thrombus. Normal compressibility, respiratory phasicity and response to augmentation. Ulnar Veins: No evidence of thrombus. Normal compressibility, respiratory phasicity and response to augmentation. Venous Reflux:  None. Other Findings:  Peripheral edema noted LEFT UPPER EXTREMITY Internal Jugular Vein: No evidence of thrombus. Normal compressibility, respiratory phasicity and response to augmentation. Subclavian Vein: No evidence of thrombus. Normal compressibility, respiratory phasicity and response to augmentation. Axillary Vein: No evidence of thrombus. Normal compressibility, respiratory phasicity and response to augmentation. Cephalic Vein: No evidence of thrombus. Normal compressibility, respiratory phasicity and response to augmentation. Basilic Vein: No evidence of thrombus. Normal compressibility, respiratory  phasicity and response to augmentation. Brachial Veins: No evidence of thrombus. Normal compressibility, respiratory phasicity and response to augmentation. Radial Veins: No evidence of thrombus. Normal compressibility, respiratory phasicity and response to augmentation. Ulnar Veins: No evidence of thrombus. Normal compressibility, respiratory phasicity and response to augmentation. Venous Reflux:  None. Other Findings:  But peripheral subcutaneous edema noted. IMPRESSION: Limited exam but no gross occlusive upper extremity DVT in either arm. Electronically Signed   By: Jerilynn Mages.  Shick M.D.   On: 07/16/2017 12:34   US Venous Img Upper Uni Left  Result Date: 07/25/2017 CLINICAL DATA:  Left upper extremity edema. EXAM: LEFT UPPER EXTREMITY VENOUS DOPPLER ULTRASOUND TECHNIQUE: Gray-scale sonography with graded compression, as well as color Doppler and duplex ultrasound were performed to evaluate the upper extremity deep venous system from the level of the subclavian vein and including the jugular, axillary, basilic, radial, ulnar and upper cephalic vein. Spectral Doppler was utilized to evaluate flow at rest and with distal augmentation maneuvers. COMPARISON:  None. FINDINGS: Contralateral Subclavian Vein: Respiratory phasicity is normal and symmetric with the symptomatic side. No evidence of thrombus. Normal compressibility. Internal Jugular Vein: No evidence of thrombus. Normal compressibility, respiratory phasicity and response to augmentation. Subclavian Vein: No evidence of thrombus. Normal compressibility, respiratory phasicity and response to augmentation. Axillary Vein: No evidence of thrombus. Normal compressibility, respiratory phasicity and response to augmentation. Cephalic Vein: No evidence of thrombus. Normal compressibility, respiratory phasicity and response to augmentation. Basilic Vein: No evidence of thrombus. Normal compressibility, respiratory phasicity and response to augmentation. Brachial Veins:  No evidence of thrombus. Normal compressibility, respiratory phasicity and response to augmentation. Radial Veins: No evidence of thrombus. Normal compressibility, respiratory phasicity and response to augmentation. Ulnar Veins: No evidence of thrombus. Normal compressibility, respiratory phasicity and response to augmentation. Venous Reflux:  None visualized. Other Findings: There is significant edema noted in the left upper arm including some more locally marginated fluid in the subcutaneous tissues of the left medial upper arm. This fluid appears to extend over a long distance and likely represents subcutaneous fluid which is more prominent in 1 area of the upper arm. If there is any concern for soft tissue infection, additional imaging may be warranted. IMPRESSION: 1. No evidence of left upper extremity deep venous thrombosis. 2. Significant  subcutaneous fluid in the left upper arm with an area of more marginated subcutaneous fluid also identified in the medial left upper arm. Although this does not have the appearance of a focal abscess by ultrasound, if there is concern for upper extremity infection, additional imaging may be warranted with CT and/or MRI. Electronically Signed   By: Aletta Edouard M.D.   On: 07/25/2017 17:48   Dg Chest Port 1 View  Result Date: 07/23/2017 CLINICAL DATA:  Acute on chronic respiratory failure. Airway aspiration. EXAM: PORTABLE CHEST 1 VIEW COMPARISON:  07/22/2017. FINDINGS: Interval enlargement of the cardiac silhouette. Stable dense left lower lobe airspace opacity. Interval increased linear and patchy density at the right lung base. Stable small bilateral pleural effusions. Nasogastric tube extending into the stomach. Thoracic spine degenerative changes. IMPRESSION: 1. No significant change in dense left lower lobe atelectasis or pneumonia and small left pleural effusion. 2. Increased right basilar atelectasis or pneumonia with a stable small right pleural effusion.  Electronically Signed   By: Claudie Revering M.D.   On: 07/23/2017 16:09   Dg Chest Port 1 View  Result Date: 07/22/2017 CLINICAL DATA:  Respiratory failure.  COPD. EXAM: PORTABLE CHEST 1 VIEW COMPARISON:  07/19/2017. FINDINGS: The cardiac silhouette remains borderline enlarged. Stable left pleural effusion and left basilar airspace opacity. Small right pleural effusion, decreased, with decreased adjacent right basilar atelectasis. Thoracic spine degenerative changes. IMPRESSION: 1. Stable left pleural effusion and left lower lobe atelectasis or pneumonia. 2. Decreased right pleural fluid and right basilar atelectasis. Electronically Signed   By: Claudie Revering M.D.   On: 07/22/2017 09:14   Dg Chest Port 1 View  Result Date: 07/19/2017 CLINICAL DATA:  Respiratory failure EXAM: PORTABLE CHEST 1 VIEW COMPARISON:  July 15, 2017 FINDINGS: There are small pleural effusions bilaterally. There is consolidation in the left lower lobe. Lungs elsewhere are clear. Heart size is upper normal with pulmonary vascularity within normal limits. No adenopathy. There is degenerative change in the thoracic spine. IMPRESSION: Left lower lobe consolidation with small pleural effusions bilaterally. There is mild bibasilar atelectasis. Stable cardiac silhouette. Electronically Signed   By: Lowella Grip III M.D.   On: 07/19/2017 07:57   Dg Chest Port 1 View  Result Date: 07/15/2017 CLINICAL DATA:  Acute respiratory failure. EXAM: PORTABLE CHEST 1 VIEW COMPARISON:  Chest x-ray from yesterday FINDINGS: The heart size and mediastinal contours are within normal limits. Normal pulmonary vascularity. Unchanged left greater than right bibasilar atelectasis and scarring at the costophrenic angles. No focal consolidation, pleural effusion, or pneumothorax. No acute osseous abnormality. IMPRESSION: Stable bibasilar atelectasis/scarring. Electronically Signed   By: Titus Dubin M.D.   On: 07/15/2017 10:41   Dg Chest Portable 1  View  Result Date: 07/14/2017 CLINICAL DATA:  Shortness of breath. EXAM: PORTABLE CHEST 1 VIEW COMPARISON:  06/05/2017. FINDINGS: Mediastinum and hilar structures normal. Stable mild cardiomegaly with normal pulmonary vascularity. Low lung volumes with mild basilar atelectasis. Stable bilateral pleural thickening consistent scarring. Degenerative changes thoracic spine. IMPRESSION: Low lung volumes with mild basilar atelectasis and/or pleuroparenchymal scarring again noted. Stable mild cardiomegaly. Chest is stable from prior exam. Electronically Signed   By: Marcello Moores  Register   On: 07/14/2017 17:13   Ct Renal Stone Study  Result Date: 07/04/2017 CLINICAL DATA:  62 year old female with blood on pad. Question rectal bleeding, vaginal bleeding or blood in urine. Initial encounter. EXAM: CT ABDOMEN AND PELVIS WITHOUT CONTRAST TECHNIQUE: Multidetector CT imaging of the abdomen and pelvis was performed following the  standard protocol without IV contrast. COMPARISON:  06/05/2017 CT. FINDINGS: Lower chest: Bibasilar atelectasis greater left once again noted. Hepatobiliary: Multiple gallstones. No CT evidence of surrounding inflammation. If patient had right upper quadrant tenderness, ultrasound can be obtained for further delineation. Taking into account limitation by non contrast imaging, no worrisome liver lesion. Pancreas: Taking into account limitation by non contrast imaging, no worrisome pancreatic lesion or inflammation. Spleen: Taking into account limitation by non contrast imaging, no worrisome splenic lesion or enlargement. Adrenals/Urinary Tract: When compared to the recent CT, there is mild fullness of the right renal collecting system without obstructing stone identified. This may be related to recently passed stone. Bilateral tiny/small nonobstructing renal calculi are noted (largest left lower pole measures 3.5 mm). Taking into account limitation by non contrast imaging, no worrisome renal or adrenal  lesion. Noncontrast filled views of the urinary bladder unremarkable. Stomach/Bowel: Moderate stool rectosigmoid region. Superior extension sigmoid colon. No extraluminal bowel inflammatory process noted. Under distended stomach without gross abnormality. Tiny lipoma third portion of the duodenum. Vascular/Lymphatic: Aortic and iliac artery calcification. No aneurysm. No adenopathy. Reproductive: Retroverted uterus.  No adnexal mass. Other: No free intraperitoneal air or bowel containing hernia. Musculoskeletal: No worrisome osseous abnormality. IMPRESSION: When compared to recent CT, there is mild fullness of the right renal collecting system without obstructing stone identified. This may be related to recently passed stone or blood clot. Bilateral tiny/small nonobstructing renal calculi. Multiple gallstones. Bibasilar atelectasis greater left once again noted. Moderate stool rectosigmoid region. Superior extension sigmoid colon. No extraluminal bowel inflammatory process noted. Tiny lipoma third portion of the duodenum. Aortic Atherosclerosis (ICD10-I70.0). Electronically Signed   By: Genia Del M.D.   On: 07/04/2017 08:03   [place] Ekg Site Rite  Result Date: 07/25/2017 If Site Rite image not attached, placement could not be confirmed due to current cardiac rhythm.    CBC Recent Labs  Lab 07/20/17 0425 07/21/17 0717 07/23/17 0541 07/25/17 0743 07/26/17 0502  WBC 15.5* 12.0* 14.5* 11.5* 9.3  HGB 9.7* 8.7* 9.0* 9.8* 8.7*  HCT 31.4* 27.9* 28.9* 31.2* 26.0*  PLT 200 218 234 373 361  MCV 92.6 91.2 91.8 92.9 91.3  MCH 28.6 28.3 28.5 29.1 30.4  MCHC 30.8* 31.1* 31.0* 31.3* 33.3  RDW 18.6* 17.7* 16.8* 17.3* 17.4*  LYMPHSABS  --   --   --  0.5* 0.3*  MONOABS  --   --   --  0.8 0.9  EOSABS  --   --   --  0.3 0.3  BASOSABS  --   --   --  0.0 0.0    Chemistries  Recent Labs  Lab 07/22/17 0527 07/23/17 0541 07/24/17 0449 07/25/17 0743 07/25/17 2130 07/26/17 0502  NA 144 140 144 139  --  141   K 4.5 4.0 4.9 2.8* 4.3 4.4  CL 93* 89* 93* 89*  --  95*  CO2 42* 41* 43* 40*  --  39*  GLUCOSE 92 116* 120* 119*  --  127*  BUN 9 12 9 12   --  9  CREATININE <0.30* <0.30* <0.30* <0.30*  --  <0.30*  CALCIUM 8.9 8.1* 8.4* 8.4*  --  8.5*  MG 1.7 1.5* 2.2 1.9  --  1.7   ------------------------------------------------------------------------------------------------------------------ CrCl cannot be calculated (This lab value cannot be used to calculate CrCl because it is not a number: <0.30). ------------------------------------------------------------------------------------------------------------------ No results for input(s): HGBA1C in the last 72 hours. ------------------------------------------------------------------------------------------------------------------ No results for input(s): CHOL, HDL, LDLCALC, TRIG, CHOLHDL, LDLDIRECT  in the last 72 hours. ------------------------------------------------------------------------------------------------------------------ No results for input(s): TSH, T4TOTAL, T3FREE, THYROIDAB in the last 72 hours.  Invalid input(s): FREET3 ------------------------------------------------------------------------------------------------------------------ No results for input(s): VITAMINB12, FOLATE, FERRITIN, TIBC, IRON, RETICCTPCT in the last 72 hours.  Coagulation profile Recent Labs  Lab 07/25/17 0743  INR 0.93    No results for input(s): DDIMER in the last 72 hours.  Cardiac Enzymes No results for input(s): CKMB, TROPONINI, MYOGLOBIN in the last 168 hours.  Invalid input(s): CK ------------------------------------------------------------------------------------------------------------------ Invalid input(s): POCBNP    Assessment & Plan   62 year old female patient with history of chronic respiratory failure, GI bleed currently in the hospital for acute on chronic hypercapnic respiratory failure and anemia secondary to slow GI  bleed.  1.Acute on chronic hypercapnic respiratory failure Continue BiPAP Impaired ventilatory drive and global muscle weakness Appreciate intensivist management.  2. Moderate malnutrition  Feeding supplements  3. 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 Electrolyte imbalance F/u magnesium and potassium  6.  Acute on chronic gastrointestinal bleeding s/p EGD 3/18 showing Normal duodenal bulb and second portion of the duodenum.Duodenal lipoma. Non-bleeding gastric ulcer with a clean ulcer base (Forrest Class III).Normal gastroesophageal junction and esophagus. - protonix 40 mg IV bid - hemoglobin 9.3 -Given rectal bleed discussed with Dr. Tahiliani--recommending monitoring hemoglobin and transfusion as needed and PPI once patient is stable clinically might consider colonoscopy if continues to have GI bleed  7. DVT prophylaxis with SCD          Code Status Orders  (From admission, onward)        Start     Ordered   07/14/17 2115  Full code  Continuous     07/14/17 2122    Code Status History    Date Active Date Inactive Code Status Order ID Comments User Context   06/05/2017 1654 06/08/2017 1820 Full Code [id number]  Bettey Costa, MD Inpatient   11/01/2016 0121 11/09/2016 2044 Full Code [id number]  Lance Coon, MD Inpatient       Time Spent in minutes   35 minutes  Greater than 50% of time spent in care coordination and counseling patient regarding the condition and plan of care.   Saundra Shelling M.D on 07/26/2017 at 4:14 PM  Between 7am to 6pm - Pager - [phone]  After 6pm go to [email or web address] - Proofreader  [employer]   Office  [phone]

## 2017-07-26 NOTE — Progress Notes (Signed)
Patient ID: Kayla Boyer, female   DOB: 10-28-1955, 62 y.o.   MRN: [id number] Interventional Radiology was consulted for a percutaneous gastrostomy tube.  Patient is scheduled for tracheostomy tube tomorrow.  I reviewed the patient's chart and imaging.  Patient has history of GI bleeding and known small gastric ulcer from recent EGD.  Would recommend direct visualization of the stomach with endoscopy during gastrostomy tube placement in order to avoid the gastric ulcer.  I discussed my concerns with Dr. Bonna Gains from GI.  She will evaluate patient for gastrostomy tube placement.

## 2017-07-26 NOTE — Progress Notes (Signed)
Vonda Antigua, MD [address] [place], [place], [place], [address] [address], [place], [place], [place], [address] Phone: [phone]  Fax: [phone]   Subjective:  Patient remains on BiPAP.  Is otherwise alert, and responsive to questions.  Objective: Vital signs in last 24 hours: Vitals:   07/26/17 1000 07/26/17 1100 07/26/17 1200 07/26/17 1300  BP: 129/87 105/78 117/67 122/71  Pulse: 88 92 95 86  Resp:      Temp:   98 F (36.7 C)   TempSrc:   Axillary   SpO2: 100% 100% 100% 100%  Weight:      Height:       Weight change: -1 lb 5.2 oz (-0.6 kg)  Intake/Output Summary (Last 24 hours) at 07/26/2017 1354 Last data filed at 07/26/2017 1200 Gross per 24 hour  Intake 2610 ml  Output 975 ml  Net 1635 ml     Exam: Vitals:   07/26/17 1100 07/26/17 1200 07/26/17 1300 07/26/17 1400  BP: 105/78 117/67 122/71 (!) 100/46  Pulse: 92 95 86 93  Resp:      Temp:  98 F (36.7 C)    TempSrc:  Axillary    SpO2: 100% 100% 100% 100%  Weight:      Height:       Abd: Soft, NT/ND, No HSM Skin: Warm, no rashes Neck: Supple, Trachea midline   Lab Results: Lab Results  Component Value Date   WBC 9.3 07/26/2017   HGB 8.7 (L) 07/26/2017   HCT 26.0 (L) 07/26/2017   MCV 91.3 07/26/2017   PLT 361 07/26/2017   Micro Results: No results found for this or any previous visit (from the past 240 hour(s)). Studies/Results: US Venous Img Upper Uni Left  Result Date: 07/25/2017 CLINICAL DATA:  Left upper extremity edema. EXAM: LEFT UPPER EXTREMITY VENOUS DOPPLER ULTRASOUND TECHNIQUE: Gray-scale sonography with graded compression, as well as color Doppler and duplex ultrasound were performed to evaluate the upper extremity deep venous system from the level of the subclavian vein and including the jugular, axillary, basilic, radial, ulnar and upper cephalic vein. Spectral Doppler was utilized to evaluate flow at rest and with distal augmentation maneuvers. COMPARISON:   None. FINDINGS: Contralateral Subclavian Vein: Respiratory phasicity is normal and symmetric with the symptomatic side. No evidence of thrombus. Normal compressibility. Internal Jugular Vein: No evidence of thrombus. Normal compressibility, respiratory phasicity and response to augmentation. Subclavian Vein: No evidence of thrombus. Normal compressibility, respiratory phasicity and response to augmentation. Axillary Vein: No evidence of thrombus. Normal compressibility, respiratory phasicity and response to augmentation. Cephalic Vein: No evidence of thrombus. Normal compressibility, respiratory phasicity and response to augmentation. Basilic Vein: No evidence of thrombus. Normal compressibility, respiratory phasicity and response to augmentation. Brachial Veins: No evidence of thrombus. Normal compressibility, respiratory phasicity and response to augmentation. Radial Veins: No evidence of thrombus. Normal compressibility, respiratory phasicity and response to augmentation. Ulnar Veins: No evidence of thrombus. Normal compressibility, respiratory phasicity and response to augmentation. Venous Reflux:  None visualized. Other Findings: There is significant edema noted in the left upper arm including some more locally marginated fluid in the subcutaneous tissues of the left medial upper arm. This fluid appears to extend over a long distance and likely represents subcutaneous fluid which is more prominent in 1 area of the upper arm. If there is any concern for soft tissue infection, additional imaging may be warranted. IMPRESSION: 1. No evidence of left upper extremity deep venous thrombosis. 2. Significant subcutaneous fluid in the left  upper arm with an area of more marginated subcutaneous fluid also identified in the medial left upper arm. Although this does not have the appearance of a focal abscess by ultrasound, if there is concern for upper extremity infection, additional imaging may be warranted with CT and/or  MRI. Electronically Signed   By: Aletta Edouard M.D.   On: 07/25/2017 17:48   [place] Ekg Site Rite  Result Date: 07/25/2017 If Site Rite image not attached, placement could not be confirmed due to current cardiac rhythm.  Medications:  Scheduled Meds: . chlorhexidine  15 mL Mouth Rinse BID  . sennosides  5 mL Per Tube BID   And  . docusate  50 mg Per Tube BID  . feeding supplement (PRO-STAT SUGAR FREE 64)  30 mL Per Tube BID  . feeding supplement (VITAL 1.5 CAL)  1,000 mL Per Tube Q24H  . ipratropium-albuterol  3 mL Nebulization Q6H  . mouth rinse  15 mL Mouth Rinse q12n4p  . multivitamin  15 mL Per Tube Daily  . pantoprazole sodium  40 mg Per Tube BID  . sodium chloride flush  10-40 mL Intracatheter Q12H  . thiamine  100 mg Per Tube Daily  . thyroid  30 mg Per Tube QAC breakfast   Continuous Infusions: . dextrose 50 mL/hr at 07/26/17 1200   PRN Meds:.acetaminophen (TYLENOL) oral liquid 160 mg/5 mL, acetaminophen **OR** acetaminophen, ALPRAZolam, bisacodyl, metoprolol tartrate, [DISCONTINUED] ondansetron **OR** ondansetron (ZOFRAN) IV, sodium chloride flush   Assessment: Active Problems:   GI bleed   Chronic gastric ulcer without hemorrhage and without perforation   Acute respiratory failure (HCC)   Goals of care, counseling/discussion   Palliative care encounter   Acute on chronic respiratory failure with hypercapnia (HCC)   Protein-calorie malnutrition (HCC)   At high risk for aspiration   Palliative care by specialist    Plan: No signs of active GI bleeding Hemoglobin has remained stable No indication for urgent endoscopy  GI was recalled for feeding tube placement Patient is having tracheostomy placement tomorrow by ENT.    There has been no swallow evaluation to done to see if patient is able to tolerate oral diet Once BiPAP is removed, patient may be able to tolerate oral feeds, since she will not have a mask covering her face  would recommend discussing with  ENT, when it would be safe to allow oral feeds after the tracheostomy Patient will need official speech and swallow evaluation If Patient able to tolerate oral diet, feeding tube will not be needed  If she is unable to tolerate oral diet, or fails her swallow evaluation, feeding tube can be considered next week Dr. Pryor Ochoa, from ENT stated we do not have to wait too long after the tracheostomy to place a PEG tube, and it can be placed even the next day.  When PEG tube consult was placed, patient was on BiPAP.  PEG tube placement cannot be done on BiPAP, after discussion with anesthesia. In addition, removal of BiPAP, constitutes a change in medical status and medical management.  Placement of a tracheostomy, patient changes patient's clinical status, and affects her ability to tolerate things by mouth, since she will not be wearing a mask.  To ensure that a PEG is indicated, would allow a few days of clinical observation with tracheostomy, to see the patient is able to tolerate oral feeds before placing PEG.   LOS: 12 days   Vonda Antigua, MD 07/26/2017, 1:54 PM

## 2017-07-26 NOTE — Progress Notes (Signed)
Pharmacy Electrolyte Monitoring Consult:  Pharmacy consulted to assist in monitoring and replacing electrolytes in this 62 y.o. female admitted on 07/14/2017 with Weakness   Labs:  Sodium (mmol/L)  Date Value  07/26/2017 141   Potassium (mmol/L)  Date Value  07/26/2017 4.4   Magnesium (mg/dL)  Date Value  07/26/2017 1.7   Phosphorus (mg/dL)  Date Value  07/26/2017 3.4   Calcium (mg/dL)  Date Value  07/26/2017 8.5 (L)   Albumin (g/dL)  Date Value  07/26/2017 2.5 (L)    Plan: Advanced feeds started 3/25.   Electrolytes WNL. No replacement needed at this time.  Follow up BMP with AM labs tomorrow.  Pharmacy to continue to monitor and replace per protocol.  Candelaria Stagers, PharmD Pharmacy Resident  07/26/2017 6:15 AM

## 2017-07-26 NOTE — Progress Notes (Signed)
OT Cancellation Note  Patient Details Name: Kayla Boyer MRN: [id number] DOB: Oct 25, 1955   Cancelled Treatment:    Reason Eval/Treat Not Completed: Medical issues which prohibited therapy;Patient not medically ready. Spoke with RN, pt continues to be on BiPAP, inappropriate for therapy at this time. Will continue to follow acutely for appropriateness for OT evaluation.   Jeni Salles, MPH, MS, OTR/L ascom [phone] 07/26/17, 11:07 AM

## 2017-07-26 NOTE — Progress Notes (Signed)
PT Cancellation Note  Patient Details Name: Kayla Boyer MRN: [id number] DOB: 1955-06-07   Cancelled Treatment:    Reason Eval/Treat Not Completed: Medical issues which prohibited therapy(Consult received. Patient remains on BiPAP and not appropriate for exertional activity per primary RN.  Plans for trach/PEG in upcoming days.  Will continue to follow and initiate services as medically appropriate.)   Gedalya Jim H. Owens Shark, PT, DPT, NCS 07/26/17, 2:17 PM [phone]

## 2017-07-26 NOTE — Progress Notes (Signed)
Name: Kayla Boyer MRN: [id number] DOB: 1955-08-26    ADMISSION DATE:  07/14/2017  BRIEF PATIENT DESCRIPTION:  89 F hx COPD with recent hosp for GIB due to ulcers presenting with continued drop in Hgb 11 to 9.0.  Pt admitted to stepdown unit 03/15 with acute on chronic hypercapnic hypoxic respiratory failure and acute encephalopathy secondary to AECOPD requiring Bipap.     SIGNIFICANT EVENTS/STUDIES  03/15-Pt admitted to stepdown unit on with acute blood loss anemia, acute encephalopathy, and acute on chronic hypoxic hypercapnic respiratory failure on Bipap  03/17-Pt transferred to [facility] unit 03/18-Pt underwent EGD results revealed normal duodenal bulb and second portion of the  duodenum, duodenal lipoma, non-bleeding gastric ulcer with a clean ulcer base, and normal gastroesophageal junction and esophagus. Rapid response called pt developed acute encephalopathy secondary to acute on chronic hypercapnic respiratory failure likely secondary to sedating medications received during EGD requiring Bipap.  03/19 still requiring intermittent BiPAP 03/20 more awake and alert.  Cognition intact.  Still requiring intermittent BiPAP 03/20 TSH:  0.345 (0.350 - 4.50), free T4: 1.20 (0.61 - 1.12), free T3: 1.3 (2.0 - 4.4) 03/21 still requiring intermittent BiPAP for apparent hypoventilation with lethargy.  3/23 NGT place and nutrition started ( no Duodenal tubes available) 3/24 CT head to evaluate left arm weakness and symmetrical pupils    SUBJECTIVE:  Continues on BiPAP.  She remains awake and reports that her breathing is better.  VITAL SIGNS: Temp:  [97.7 F (36.5 C)-98 F (36.7 C)] 98 F (36.7 C) (03/27 1200) Pulse Rate:  [86-105] 93 (03/27 1400) Resp:  [17-30] 26 (03/27 0400) BP: (91-132)/(45-87) 100/46 (03/27 1400) SpO2:  [92 %-100 %] 100 % (03/27 1400) FiO2 (%):  [24 %-28 %] 28 % (03/27 0808) Weight:  [132 lb 4.4 oz (60 kg)] 132 lb 4.4 oz (60 kg) (03/27 0600)  PHYSICAL  EXAMINATION: NEURO: remains  awake and interactive HEENT: NCAT, sclerae white, drooling intermittently when off BIPAP Diffusely diminished breath sounds, no wheezes or other adventitious sounds Heart sounds, regular, no Murmurs Obese, soft, nontender Extremities warm with 1+edema Cranial nerves intact but very deaf, left arm weakness , right pupil> left Lower extremities 2/5 bilateral  Recent Labs  Lab 07/24/17 0449 07/25/17 0743 07/25/17 2130 07/26/17 0502  NA 144 139  --  141  K 4.9 2.8* 4.3 4.4  CL 93* 89*  --  95*  CO2 43* 40*  --  39*  BUN 9 12  --  9  CREATININE <0.30* <0.30*  --  <0.30*  GLUCOSE 120* 119*  --  127*   Recent Labs  Lab 07/23/17 0541 07/25/17 0743 07/26/17 0502  HGB 9.0* 9.8* 8.7*  HCT 28.9* 31.2* 26.0*  WBC 14.5* 11.5* 9.3  PLT 234 373 361   CXR: Mild bibasilar atelectasis with very small bilateral effusions  ASSESSMENT / PLAN: 1. Acute on chronic hypercapnic respiratory failure due primarily to impaired ventilatory drive and global muscle weakness- dependent on NIPPV - Sick Euthyroid will need repeat when she is well. 2. Moderate Malnutrition- protein and caloric  3. Severe deconditioning with global muscle weakness causing respiratory impairment 3. Possible CVA- left arm weakness, drooling. CT head negative but patient unable to tolerate MRI safely. 4. Electrolytes abnormality resolved   Plan;  1. Continue on NIPPV and HFO to keep SpO2 88-94%  2. Continue on NGT feed with slow increase to avoid refeeding syndrome, electrolytes replacement 3.  ENT planning for trach on thursday 4.  Peg tube placement plans  pending 5.  Care manager consult for placement  Family: Son updated    Cammie Sickle, MD PCCM service Pager [phone] 07/26/2017 3:32 PM

## 2017-07-27 ENCOUNTER — Encounter
Admission: EM | Disposition: A | Discharge status: Skilled Nursing Facility | Payer: Self-pay | Source: Home / Self Care | Attending: Internal Medicine

## 2017-07-27 ENCOUNTER — Inpatient Hospital Stay: Payer: Medicaid Other

## 2017-07-27 ENCOUNTER — Encounter: Payer: Self-pay | Admitting: Anesthesiology

## 2017-07-27 ENCOUNTER — Inpatient Hospital Stay: Payer: Medicaid Other | Admitting: Anesthesiology

## 2017-07-27 HISTORY — PX: TRACHEOSTOMY TUBE PLACEMENT: SHX814

## 2017-07-27 LAB — BASIC METABOLIC PANEL
Anion gap: 8 (ref 5–15)
BUN: 12 mg/dL (ref 6–20)
CHLORIDE: 92 mmol/L — AB (ref 101–111)
CO2: 38 mmol/L — AB (ref 22–32)
Calcium: 8.8 mg/dL — ABNORMAL LOW (ref 8.9–10.3)
Creatinine, Ser: <0.3 mg/dL — ABNORMAL LOW (ref 0.44–1.00)
Glucose, Bld: 112 mg/dL — ABNORMAL HIGH (ref 65–99)
POTASSIUM: 3.9 mmol/L (ref 3.5–5.1)
SODIUM: 138 mmol/L (ref 135–145)

## 2017-07-27 LAB — BLOOD GAS, ARTERIAL
Acid-Base Excess: 16.6 mmol/L — ABNORMAL HIGH (ref 0.0–2.0)
Bicarbonate: 40.3 mmol/L — ABNORMAL HIGH (ref 20.0–28.0)
FIO2: 0.24
O2 SAT: 98.3 %
PCO2 ART: 44 mmHg (ref 32.0–48.0)
PEEP: 5 cmH2O
Patient temperature: 37
RATE: 15 resp/min
VT: 450 mL
pH, Arterial: 7.57 — ABNORMAL HIGH (ref 7.350–7.450)
pO2, Arterial: 94 mmHg (ref 83.0–108.0)

## 2017-07-27 LAB — GLUCOSE, CAPILLARY
GLUCOSE-CAPILLARY: 102 mg/dL — AB (ref 65–99)
GLUCOSE-CAPILLARY: 128 mg/dL — AB (ref 65–99)
GLUCOSE-CAPILLARY: 172 mg/dL — AB (ref 65–99)
Glucose-Capillary: 106 mg/dL — ABNORMAL HIGH (ref 65–99)
Glucose-Capillary: 89 mg/dL (ref 65–99)
Glucose-Capillary: 155 mg/dL — ABNORMAL HIGH (ref 65–99)

## 2017-07-27 SURGERY — CREATION, TRACHEOSTOMY
Anesthesia: General

## 2017-07-27 MED ORDER — LIDOCAINE HCL (CARDIAC) 20 MG/ML IV SOLN
INTRAVENOUS | Status: DC | PRN
  Administered 2017-07-27: 80 mg via INTRAVENOUS

## 2017-07-27 MED ORDER — SUCCINYLCHOLINE CHLORIDE 20 MG/ML IJ SOLN
INTRAMUSCULAR | Status: DC | PRN
  Administered 2017-07-27: 80 mg via INTRAVENOUS

## 2017-07-27 MED ORDER — LIDOCAINE HCL (PF) 2 % IJ SOLN
INTRAMUSCULAR | Status: AC
  Filled 2017-07-27: qty 10

## 2017-07-27 MED ORDER — FENTANYL CITRATE (PF) 250 MCG/5ML IJ SOLN
INTRAMUSCULAR | Status: AC
  Filled 2017-07-27: qty 5

## 2017-07-27 MED ORDER — ROCURONIUM BROMIDE 50 MG/5ML IV SOLN
INTRAVENOUS | Status: AC
  Filled 2017-07-27: qty 1

## 2017-07-27 MED ORDER — ROCURONIUM BROMIDE 100 MG/10ML IV SOLN
INTRAVENOUS | Status: DC | PRN
  Administered 2017-07-27: 40 mg via INTRAVENOUS
  Administered 2017-07-27: 10 mg via INTRAVENOUS

## 2017-07-27 MED ORDER — DEXAMETHASONE SODIUM PHOSPHATE 10 MG/ML IJ SOLN
INTRAMUSCULAR | Status: AC
  Filled 2017-07-27: qty 1

## 2017-07-27 MED ORDER — LIDOCAINE-EPINEPHRINE 1 %-1:100000 IJ SOLN
INTRAMUSCULAR | Status: DC | PRN
  Administered 2017-07-27: 7 mL

## 2017-07-27 MED ORDER — ONDANSETRON HCL 4 MG/2ML IJ SOLN
INTRAMUSCULAR | Status: AC
  Filled 2017-07-27: qty 2

## 2017-07-27 MED ORDER — VITAL HIGH PROTEIN PO LIQD
1000.0000 mL | ORAL | Status: DC
Start: 2017-07-27 — End: 2017-07-28
  Administered 2017-07-27: 1000 mL

## 2017-07-27 MED ORDER — HEPARIN SODIUM (PORCINE) 1000 UNIT/ML IJ SOLN
INTRAMUSCULAR | Status: AC
  Filled 2017-07-27: qty 1

## 2017-07-27 MED ORDER — PROPOFOL 10 MG/ML IV BOLUS
INTRAVENOUS | Status: DC | PRN
  Administered 2017-07-27: 100 mg via INTRAVENOUS

## 2017-07-27 MED ORDER — INSULIN ASPART 100 UNIT/ML ~~LOC~~ SOLN
0.0000 [IU] | Freq: Four times a day (QID) | SUBCUTANEOUS | Status: DC
  Administered 2017-07-27: 2 [IU] via SUBCUTANEOUS
  Administered 2017-07-27 – 2017-07-31 (×8): 1 [IU] via SUBCUTANEOUS
  Filled 2017-07-27 (×8): qty 1

## 2017-07-27 MED ORDER — SUGAMMADEX SODIUM 200 MG/2ML IV SOLN
INTRAVENOUS | Status: AC
  Filled 2017-07-27: qty 2

## 2017-07-27 MED ORDER — DEXAMETHASONE SODIUM PHOSPHATE 10 MG/ML IJ SOLN
INTRAMUSCULAR | Status: DC | PRN
  Administered 2017-07-27: 5 mg via INTRAVENOUS

## 2017-07-27 MED ORDER — MORPHINE SULFATE (PF) 2 MG/ML IV SOLN
2.0000 mg | INTRAVENOUS | Status: DC | PRN
  Administered 2017-07-27 (×2): 2 mg via INTRAVENOUS
  Administered 2017-07-27: 1 mg via INTRAVENOUS
  Administered 2017-07-28 – 2017-08-05 (×12): 2 mg via INTRAVENOUS
  Filled 2017-07-27 (×15): qty 1

## 2017-07-27 MED ORDER — IPRATROPIUM-ALBUTEROL 0.5-2.5 (3) MG/3ML IN SOLN
3.0000 mL | RESPIRATORY_TRACT | Status: DC
  Administered 2017-07-27 – 2017-08-01 (×27): 3 mL via RESPIRATORY_TRACT
  Filled 2017-07-27 (×27): qty 3

## 2017-07-27 MED ORDER — FENTANYL CITRATE (PF) 100 MCG/2ML IJ SOLN
INTRAMUSCULAR | Status: DC | PRN
  Administered 2017-07-27: 25 ug via INTRAVENOUS
  Administered 2017-07-27: 100 ug via INTRAVENOUS
  Administered 2017-07-27: 25 ug via INTRAVENOUS
  Administered 2017-07-27 (×2): 50 ug via INTRAVENOUS

## 2017-07-27 MED ORDER — PHENYLEPHRINE HCL 10 MG/ML IJ SOLN
INTRAMUSCULAR | Status: DC | PRN
  Administered 2017-07-27 (×2): 100 ug via INTRAVENOUS

## 2017-07-27 SURGICAL SUPPLY — 31 items
BLADE SURG 15 STRL LF DISP TIS (BLADE) ×1 IMPLANT
BLADE SURG 15 STRL SS (BLADE) ×2
BLADE SURG SZ11 CARB STEEL (BLADE) ×3 IMPLANT
CANISTER SUCT 1200ML W/VALVE (MISCELLANEOUS) ×3 IMPLANT
CORD BIP STRL DISP 12FT (MISCELLANEOUS) ×3 IMPLANT
ELECT REM PT RETURN 9FT ADLT (ELECTROSURGICAL) ×3
ELECTRODE REM PT RTRN 9FT ADLT (ELECTROSURGICAL) ×1 IMPLANT
FORCEPS JEWEL BIP 4-3/4 STR (INSTRUMENTS) ×3 IMPLANT
GAUZE PACKING IODOFORM 1/2 (PACKING) ×3 IMPLANT
GLOVE BIO SURGEON STRL SZ7.5 (GLOVE) ×3 IMPLANT
GOWN STRL REUS W/ TWL LRG LVL3 (GOWN DISPOSABLE) ×2 IMPLANT
GOWN STRL REUS W/TWL LRG LVL3 (GOWN DISPOSABLE) ×4
HEMOSTAT SURGICEL 2X3 (HEMOSTASIS) ×3 IMPLANT
HLDR TRACH TUBE NECKBAND 18 (MISCELLANEOUS) ×1 IMPLANT
HOLDER TRACH TUBE NECKBAND 18 (MISCELLANEOUS) ×2
KIT TURNOVER KIT A (KITS) ×3 IMPLANT
LABEL OR SOLS (LABEL) IMPLANT
NS IRRIG 500ML POUR BTL (IV SOLUTION) ×3 IMPLANT
PACK HEAD/NECK (MISCELLANEOUS) ×3 IMPLANT
SHEARS HARMONIC 9CM CVD (BLADE) ×3 IMPLANT
SPONGE DRAIN TRACH 4X4 STRL 2S (GAUZE/BANDAGES/DRESSINGS) ×3 IMPLANT
SPONGE KITTNER 5P (MISCELLANEOUS) ×3 IMPLANT
SUCTION FRAZIER HANDLE 10FR (MISCELLANEOUS) ×2
SUCTION TUBE FRAZIER 10FR DISP (MISCELLANEOUS) ×1 IMPLANT
SUT SILK 2 0 (SUTURE) ×2
SUT SILK 2 0 SH (SUTURE) ×12 IMPLANT
SUT SILK 2-0 18XBRD TIE 12 (SUTURE) ×1 IMPLANT
SUT VIC AB 3-0 PS2 18 (SUTURE) ×3 IMPLANT
SYR 3ML LL SCALE MARK (SYRINGE) ×3 IMPLANT
TUBE TRACH SHILEY  6 DIST  CUF (TUBING) ×3 IMPLANT
TUBE TRACH SHILEY 8 DIST CUF (TUBING) IMPLANT

## 2017-07-27 NOTE — Clinical Social Work Note (Signed)
CSW awaiting to see if patient will require ventilator or continuous bipap as this could mean different level of care and different facility type bed search. Shela Leff MSW,LCSW [phone]

## 2017-07-27 NOTE — Anesthesia Procedure Notes (Signed)
Procedure Name: Intubation Date/Time: 07/27/2017 9:21 AM Performed by: Gunnar Bulla, MD Pre-anesthesia Checklist: Patient identified, Emergency Drugs available, Suction available, Patient being monitored and Timeout performed Patient Re-evaluated:Patient Re-evaluated prior to induction Oxygen Delivery Method: Circle system utilized Preoxygenation: Pre-oxygenation with 100% oxygen (From ICU BiPap) Induction Type: IV induction, Rapid sequence and Cricoid Pressure applied Laryngoscope Size: McGraph and 3 Grade View: Grade I Tube type: Oral Tube size: 6.0 mm Number of attempts: 2 (6.0 ETT uncuffed intially placed, and removed and replaced with 6.0 ETT cuffed with no complications) Airway Equipment and Method: Stylet and Video-laryngoscopy Placement Confirmation: ETT inserted through vocal cords under direct vision,  positive ETCO2,  CO2 detector and breath sounds checked- equal and bilateral Secured at: 21 cm Tube secured with: Tape Dental Injury: Teeth and Oropharynx as per pre-operative assessment

## 2017-07-27 NOTE — Progress Notes (Signed)
[place] at [facility]                                                                                                                                                                                  Patient Demographics   Kayla Boyer, is a 62 y.o. female, DOB - June 28, 1955, GEX:[id number]  Admit date - 07/14/2017   Admitting Physician Saundra Shelling, MD  Outpatient Primary MD for the patient is Patient, [name]   LOS - 13  Subjective: Patient seen and evaluated she is s/p tracheostomy with ventilator support Tidal volume 450 PEEP 5 PS 15 FIO2 40 % Awake and responds to verbal commands Has generalized weakness  Review of Systems:  On ventilator support via tracheostomy Opens eyes and communicate some verbal commands Complete review of systems not possible  Vitals:   Vitals:   07/27/17 1100 07/27/17 1200 07/27/17 1300 07/27/17 1400  BP: (!) 148/110 (!) 154/104 (!) 143/80 (!) 136/91  Pulse: 82 83 96 95  Resp: 15 15 (!) 21 14  Temp:  98.6 F (37 C)    TempSrc:  Axillary    SpO2: 100% 100% 100% 100%  Weight:      Height:        Wt Readings from Last 3 Encounters:  07/27/17 59.8 kg (131 lb 13.4 oz)  07/05/17 60.8 kg (134 lb)  07/04/17 60.8 kg (134 lb)     Intake/Output Summary (Last 24 hours) at 07/27/2017 1449 Last data filed at 07/27/2017 1400 Gross per 24 hour  Intake 1953.83 ml  Output 970 ml  Net 983.83 ml    Physical Exam:   GENERAL: 62 year old female patient lying on the bed in the stepdown unit on BiPAP HEAD, EYES, EARS, NOSE AND THROAT: Atraumatic, normocephalic. Extraocular muscles are intact. Pupils equal and reactive to light. Sclerae anicteric. No conjunctival injection. No oro-pharyngeal erythema.  NECK: Supple. There is no jugular venous distention. No bruits, no lymphadenopathy, no thyromegaly.  S/p tracheostomy  HEART: Regular rate and rhythm,. No murmurs, no rubs, no clicks.  LUNGS: Clear to auscultation  bilaterally. No rales or rhonchi. No wheezes.  On ventilator support via tracheostomy ABDOMEN: Soft, flat, nontender, nondistended. Has good bowel sounds. No hepatosplenomegaly appreciated.  EXTREMITIES: No evidence of any cyanosis, clubbing.  +2 pedal and radial pulses bilaterally.  1+ pedal edema NEUROLOGIC: The patient is alert, arousable, and oriented x2 with   Decreased hearing, left arm weakness Right pupil greater than left pupil SKIN: Moist and warm with no rashes appreciated.  Psych: Not be assessed LN: No inguinal LN enlargement    Antibiotics   Anti-infectives (From admission, onward)   Start  Dose/Rate Route Frequency Ordered Stop   07/14/17 2200  azithromycin (ZITHROMAX) 500 mg in sodium chloride 0.9 % 250 mL IVPB  Status:  Discontinued     500 mg 250 mL/hr over 60 Minutes Intravenous Every 24 hours 07/14/17 2122 07/18/17 0915      Medications   Scheduled Meds: . chlorhexidine  15 mL Mouth Rinse BID  . sennosides  5 mL Per Tube BID   And  . docusate  50 mg Per Tube BID  . feeding supplement (PRO-STAT SUGAR FREE 64)  30 mL Per Tube BID  . feeding supplement (VITAL 1.5 CAL)  1,000 mL Per Tube Q24H  . ipratropium-albuterol  3 mL Nebulization Q6H  . mouth rinse  15 mL Mouth Rinse q12n4p  . multivitamin  15 mL Per Tube Daily  . pantoprazole sodium  40 mg Per Tube BID  . sodium chloride flush  10-40 mL Intracatheter Q12H  . thiamine  100 mg Per Tube Daily  . thyroid  30 mg Per Tube QAC breakfast   Continuous Infusions: . dextrose 50 mL/hr at 07/27/17 1408   PRN Meds:.acetaminophen (TYLENOL) oral liquid 160 mg/5 mL, acetaminophen **OR** acetaminophen, ALPRAZolam, bisacodyl, metoprolol tartrate, morphine injection, [DISCONTINUED] ondansetron **OR** ondansetron (ZOFRAN) IV, sodium chloride flush   Data Review:   Micro Results No results found for this or any previous visit (from the past 240 hour(s)).  Radiology Reports Dg Abd 1 View  Result Date:  07/22/2017 CLINICAL DATA:  Encounter for feeding tube placement. EXAM: ABDOMEN - 1 VIEW COMPARISON:  Chest radiograph 07/22/2017 FINDINGS: A nasogastric tube has been advanced into the abdomen. Catheter tip is in the distal stomach region. Bowel gas throughout the abdomen. Persistent densities at the left lung base and the left hemidiaphragm remains obscured. IMPRESSION: Feeding tube tip in the distal stomach region. Electronically Signed   By: Markus Daft M.D.   On: 07/22/2017 16:30   Ct Head Wo Contrast  Result Date: 07/23/2017 CLINICAL DATA:  62 y/o F; changes to left pupil in strength of left arm. EXAM: CT HEAD WITHOUT CONTRAST TECHNIQUE: Contiguous axial images were obtained from the base of the skull through the vertex without intravenous contrast. COMPARISON:  None. FINDINGS: Brain: No evidence of acute infarction, hemorrhage, hydrocephalus, extra-axial collection or mass lesion/mass effect. Vascular: Calcific atherosclerosis of carotid siphons. No hyperdense vessel. Skull: Normal. Negative for fracture or focal lesion. Sinuses/Orbits: No acute finding. Other: None. IMPRESSION: Negative CT of the head. Electronically Signed   By: Kristine Garbe M.D.   On: 07/23/2017 14:33   Nm Gi Blood Loss  Result Date: 07/20/2017 CLINICAL DATA:  GI bleeding EXAM: NUCLEAR MEDICINE GASTROINTESTINAL BLEEDING SCAN TECHNIQUE: Sequential abdominal images were obtained following intravenous administration of Tc-12m labeled red blood cells. RADIOPHARMACEUTICALS:  Twenty mCi Tc-18m pertechnetate in-vitro labeled red cells. COMPARISON:  None. FINDINGS: Normal distribution of radiotracer is noted. No focal area of tracer accumulation is identified to suggest active GI bleeding is noted. IMPRESSION: No findings to suggest active GI hemorrhage. Electronically Signed   By: Inez Catalina M.D.   On: 07/20/2017 15:16   US Venous Img Upper Bilat  Result Date: 07/16/2017 CLINICAL DATA:  Bilateral upper extremity edema  acutely with pain and discomfort for 1 day EXAM: BILATERAL UPPER EXTREMITY VENOUS DOPPLER ULTRASOUND TECHNIQUE: Gray-scale sonography with graded compression, as well as color Doppler and duplex ultrasound were performed to evaluate the bilateral upper extremity deep venous systems from the level of the subclavian vein and including the jugular, axillary,  basilic, radial, ulnar and upper cephalic vein. Spectral Doppler was utilized to evaluate flow at rest and with distal augmentation maneuvers. COMPARISON:  None. FINDINGS: Exam is limited because of upper extremity subcutaneous edema. RIGHT UPPER EXTREMITY Internal Jugular Vein: No evidence of thrombus. Normal compressibility, respiratory phasicity and response to augmentation. Subclavian Vein: No evidence of thrombus. Normal compressibility, respiratory phasicity and response to augmentation. Axillary Vein: No evidence of thrombus. Normal compressibility, respiratory phasicity and response to augmentation. Cephalic Vein: No evidence of thrombus. Normal compressibility, respiratory phasicity and response to augmentation. Basilic Vein: Not well visualized to accurately assess. Brachial Veins: No evidence of thrombus. Normal compressibility, respiratory phasicity and response to augmentation. Radial Veins: No evidence of thrombus. Normal compressibility, respiratory phasicity and response to augmentation. Ulnar Veins: No evidence of thrombus. Normal compressibility, respiratory phasicity and response to augmentation. Venous Reflux:  None. Other Findings:  Peripheral edema noted LEFT UPPER EXTREMITY Internal Jugular Vein: No evidence of thrombus. Normal compressibility, respiratory phasicity and response to augmentation. Subclavian Vein: No evidence of thrombus. Normal compressibility, respiratory phasicity and response to augmentation. Axillary Vein: No evidence of thrombus. Normal compressibility, respiratory phasicity and response to augmentation. Cephalic Vein: No  evidence of thrombus. Normal compressibility, respiratory phasicity and response to augmentation. Basilic Vein: No evidence of thrombus. Normal compressibility, respiratory phasicity and response to augmentation. Brachial Veins: No evidence of thrombus. Normal compressibility, respiratory phasicity and response to augmentation. Radial Veins: No evidence of thrombus. Normal compressibility, respiratory phasicity and response to augmentation. Ulnar Veins: No evidence of thrombus. Normal compressibility, respiratory phasicity and response to augmentation. Venous Reflux:  None. Other Findings:  But peripheral subcutaneous edema noted. IMPRESSION: Limited exam but no gross occlusive upper extremity DVT in either arm. Electronically Signed   By: Jerilynn Mages.  Shick M.D.   On: 07/16/2017 12:34   US Venous Img Upper Uni Left  Result Date: 07/25/2017 CLINICAL DATA:  Left upper extremity edema. EXAM: LEFT UPPER EXTREMITY VENOUS DOPPLER ULTRASOUND TECHNIQUE: Gray-scale sonography with graded compression, as well as color Doppler and duplex ultrasound were performed to evaluate the upper extremity deep venous system from the level of the subclavian vein and including the jugular, axillary, basilic, radial, ulnar and upper cephalic vein. Spectral Doppler was utilized to evaluate flow at rest and with distal augmentation maneuvers. COMPARISON:  None. FINDINGS: Contralateral Subclavian Vein: Respiratory phasicity is normal and symmetric with the symptomatic side. No evidence of thrombus. Normal compressibility. Internal Jugular Vein: No evidence of thrombus. Normal compressibility, respiratory phasicity and response to augmentation. Subclavian Vein: No evidence of thrombus. Normal compressibility, respiratory phasicity and response to augmentation. Axillary Vein: No evidence of thrombus. Normal compressibility, respiratory phasicity and response to augmentation. Cephalic Vein: No evidence of thrombus. Normal compressibility, respiratory  phasicity and response to augmentation. Basilic Vein: No evidence of thrombus. Normal compressibility, respiratory phasicity and response to augmentation. Brachial Veins: No evidence of thrombus. Normal compressibility, respiratory phasicity and response to augmentation. Radial Veins: No evidence of thrombus. Normal compressibility, respiratory phasicity and response to augmentation. Ulnar Veins: No evidence of thrombus. Normal compressibility, respiratory phasicity and response to augmentation. Venous Reflux:  None visualized. Other Findings: There is significant edema noted in the left upper arm including some more locally marginated fluid in the subcutaneous tissues of the left medial upper arm. This fluid appears to extend over a long distance and likely represents subcutaneous fluid which is more prominent in 1 area of the upper arm. If there is any concern for soft tissue infection, additional imaging may  be warranted. IMPRESSION: 1. No evidence of left upper extremity deep venous thrombosis. 2. Significant subcutaneous fluid in the left upper arm with an area of more marginated subcutaneous fluid also identified in the medial left upper arm. Although this does not have the appearance of a focal abscess by ultrasound, if there is concern for upper extremity infection, additional imaging may be warranted with CT and/or MRI. Electronically Signed   By: Aletta Edouard M.D.   On: 07/25/2017 17:48   Dg Chest Port 1 View  Result Date: 07/27/2017 CLINICAL DATA:  Excessive fluid EXAM: PORTABLE CHEST 1 VIEW COMPARISON:  Chest x-ray of July 23, 2017 FINDINGS: The lungs are well-expanded. There is a trace of pleural fluid on the right and slightly more on the left. The retrocardiac region remains dense. The cardiac silhouette is enlarged. The central pulmonary vascularity is prominent. The mediastinum is normal in width. The trachea is midline. The right-sided PICC line tip projects over the midportion of the SVC.  The tracheostomy tube tip projects at the superior margin of the clavicular heads. The bony thorax exhibits no acute abnormality. IMPRESSION: Slight improved aeration of both lungs today. Small amounts of pleural fluid at both bases. Persistent atelectasis or pneumonia in the left lower lobe. Top-normal cardiac size with central pulmonary vascular congestion but no definite pulmonary edema. Electronically Signed   By: David  Martinique M.D.   On: 07/27/2017 10:55   Dg Chest Port 1 View  Result Date: 07/23/2017 CLINICAL DATA:  Acute on chronic respiratory failure. Airway aspiration. EXAM: PORTABLE CHEST 1 VIEW COMPARISON:  07/22/2017. FINDINGS: Interval enlargement of the cardiac silhouette. Stable dense left lower lobe airspace opacity. Interval increased linear and patchy density at the right lung base. Stable small bilateral pleural effusions. Nasogastric tube extending into the stomach. Thoracic spine degenerative changes. IMPRESSION: 1. No significant change in dense left lower lobe atelectasis or pneumonia and small left pleural effusion. 2. Increased right basilar atelectasis or pneumonia with a stable small right pleural effusion. Electronically Signed   By: Claudie Revering M.D.   On: 07/23/2017 16:09   Dg Chest Port 1 View  Result Date: 07/22/2017 CLINICAL DATA:  Respiratory failure.  COPD. EXAM: PORTABLE CHEST 1 VIEW COMPARISON:  07/19/2017. FINDINGS: The cardiac silhouette remains borderline enlarged. Stable left pleural effusion and left basilar airspace opacity. Small right pleural effusion, decreased, with decreased adjacent right basilar atelectasis. Thoracic spine degenerative changes. IMPRESSION: 1. Stable left pleural effusion and left lower lobe atelectasis or pneumonia. 2. Decreased right pleural fluid and right basilar atelectasis. Electronically Signed   By: Claudie Revering M.D.   On: 07/22/2017 09:14   Dg Chest Port 1 View  Result Date: 07/19/2017 CLINICAL DATA:  Respiratory failure EXAM:  PORTABLE CHEST 1 VIEW COMPARISON:  July 15, 2017 FINDINGS: There are small pleural effusions bilaterally. There is consolidation in the left lower lobe. Lungs elsewhere are clear. Heart size is upper normal with pulmonary vascularity within normal limits. No adenopathy. There is degenerative change in the thoracic spine. IMPRESSION: Left lower lobe consolidation with small pleural effusions bilaterally. There is mild bibasilar atelectasis. Stable cardiac silhouette. Electronically Signed   By: Lowella Grip III M.D.   On: 07/19/2017 07:57   Dg Chest Port 1 View  Result Date: 07/15/2017 CLINICAL DATA:  Acute respiratory failure. EXAM: PORTABLE CHEST 1 VIEW COMPARISON:  Chest x-ray from yesterday FINDINGS: The heart size and mediastinal contours are within normal limits. Normal pulmonary vascularity. Unchanged left greater than right bibasilar atelectasis  and scarring at the costophrenic angles. No focal consolidation, pleural effusion, or pneumothorax. No acute osseous abnormality. IMPRESSION: Stable bibasilar atelectasis/scarring. Electronically Signed   By: Titus Dubin M.D.   On: 07/15/2017 10:41   Dg Chest Portable 1 View  Result Date: 07/14/2017 CLINICAL DATA:  Shortness of breath. EXAM: PORTABLE CHEST 1 VIEW COMPARISON:  06/05/2017. FINDINGS: Mediastinum and hilar structures normal. Stable mild cardiomegaly with normal pulmonary vascularity. Low lung volumes with mild basilar atelectasis. Stable bilateral pleural thickening consistent scarring. Degenerative changes thoracic spine. IMPRESSION: Low lung volumes with mild basilar atelectasis and/or pleuroparenchymal scarring again noted. Stable mild cardiomegaly. Chest is stable from prior exam. Electronically Signed   By: Marcello Moores  Register   On: 07/14/2017 17:13   Ct Renal Stone Study  Result Date: 07/04/2017 CLINICAL DATA:  62 year old female with blood on pad. Question rectal bleeding, vaginal bleeding or blood in urine. Initial encounter.  EXAM: CT ABDOMEN AND PELVIS WITHOUT CONTRAST TECHNIQUE: Multidetector CT imaging of the abdomen and pelvis was performed following the standard protocol without IV contrast. COMPARISON:  06/05/2017 CT. FINDINGS: Lower chest: Bibasilar atelectasis greater left once again noted. Hepatobiliary: Multiple gallstones. No CT evidence of surrounding inflammation. If patient had right upper quadrant tenderness, ultrasound can be obtained for further delineation. Taking into account limitation by non contrast imaging, no worrisome liver lesion. Pancreas: Taking into account limitation by non contrast imaging, no worrisome pancreatic lesion or inflammation. Spleen: Taking into account limitation by non contrast imaging, no worrisome splenic lesion or enlargement. Adrenals/Urinary Tract: When compared to the recent CT, there is mild fullness of the right renal collecting system without obstructing stone identified. This may be related to recently passed stone. Bilateral tiny/small nonobstructing renal calculi are noted (largest left lower pole measures 3.5 mm). Taking into account limitation by non contrast imaging, no worrisome renal or adrenal lesion. Noncontrast filled views of the urinary bladder unremarkable. Stomach/Bowel: Moderate stool rectosigmoid region. Superior extension sigmoid colon. No extraluminal bowel inflammatory process noted. Under distended stomach without gross abnormality. Tiny lipoma third portion of the duodenum. Vascular/Lymphatic: Aortic and iliac artery calcification. No aneurysm. No adenopathy. Reproductive: Retroverted uterus.  No adnexal mass. Other: No free intraperitoneal air or bowel containing hernia. Musculoskeletal: No worrisome osseous abnormality. IMPRESSION: When compared to recent CT, there is mild fullness of the right renal collecting system without obstructing stone identified. This may be related to recently passed stone or blood clot. Bilateral tiny/small nonobstructing renal  calculi. Multiple gallstones. Bibasilar atelectasis greater left once again noted. Moderate stool rectosigmoid region. Superior extension sigmoid colon. No extraluminal bowel inflammatory process noted. Tiny lipoma third portion of the duodenum. Aortic Atherosclerosis (ICD10-I70.0). Electronically Signed   By: Genia Del M.D.   On: 07/04/2017 08:03   [place] Ekg Site Rite  Result Date: 07/25/2017 If Site Rite image not attached, placement could not be confirmed due to current cardiac rhythm.    CBC Recent Labs  Lab 07/21/17 0717 07/23/17 0541 07/25/17 0743 07/26/17 0502  WBC 12.0* 14.5* 11.5* 9.3  HGB 8.7* 9.0* 9.8* 8.7*  HCT 27.9* 28.9* 31.2* 26.0*  PLT 218 234 373 361  MCV 91.2 91.8 92.9 91.3  MCH 28.3 28.5 29.1 30.4  MCHC 31.1* 31.0* 31.3* 33.3  RDW 17.7* 16.8* 17.3* 17.4*  LYMPHSABS  --   --  0.5* 0.3*  MONOABS  --   --  0.8 0.9  EOSABS  --   --  0.3 0.3  BASOSABS  --   --  0.0 0.0  Chemistries  Recent Labs  Lab 07/22/17 0527 07/23/17 0541 07/24/17 0449 07/25/17 0743 07/25/17 2130 07/26/17 0502 07/27/17 0401  NA 144 140 144 139  --  141 138  K 4.5 4.0 4.9 2.8* 4.3 4.4 3.9  CL 93* 89* 93* 89*  --  95* 92*  CO2 42* 41* 43* 40*  --  39* 38*  GLUCOSE 92 116* 120* 119*  --  127* 112*  BUN 9 12 9 12   --  9 12  CREATININE <0.30* <0.30* <0.30* <0.30*  --  <0.30* <0.30*  CALCIUM 8.9 8.1* 8.4* 8.4*  --  8.5* 8.8*  MG 1.7 1.5* 2.2 1.9  --  1.7  --    ------------------------------------------------------------------------------------------------------------------ CrCl cannot be calculated (This lab value cannot be used to calculate CrCl because it is not a number: <0.30). ------------------------------------------------------------------------------------------------------------------ No results for input(s): HGBA1C in the last 72 hours. ------------------------------------------------------------------------------------------------------------------ No results for  input(s): CHOL, HDL, LDLCALC, TRIG, CHOLHDL, LDLDIRECT in the last 72 hours. ------------------------------------------------------------------------------------------------------------------ No results for input(s): TSH, T4TOTAL, T3FREE, THYROIDAB in the last 72 hours.  Invalid input(s): FREET3 ------------------------------------------------------------------------------------------------------------------ No results for input(s): VITAMINB12, FOLATE, FERRITIN, TIBC, IRON, RETICCTPCT in the last 72 hours.  Coagulation profile Recent Labs  Lab 07/25/17 0743  INR 0.93    No results for input(s): DDIMER in the last 72 hours.  Cardiac Enzymes No results for input(s): CKMB, TROPONINI, MYOGLOBIN in the last 168 hours.  Invalid input(s): CK ------------------------------------------------------------------------------------------------------------------ Invalid input(s): POCBNP    Assessment & Plan   62 year old female patient with history of chronic respiratory failure, GI bleed currently in the hospital for acute on chronic hypercapnic respiratory failure and anemia secondary to slow GI bleed.  1.Acute on chronic hypercapnic respiratory failure S/p tracheostomy on ventilator support Impaired ventilatory drive and global muscle weakness Appreciate intensivist management.  2. Moderate malnutrition  Feeding supplements  3. PEG tube placement for feeding  4. Electrolyte imbalance F/u magnesium and potassium  5.  Acute on chronic gastrointestinal bleeding s/p EGD 3/18 showing Normal duodenal bulb and second portion of the duodenum.Duodenal lipoma. Non-bleeding gastric ulcer with a clean ulcer base (Forrest Class III).Normal gastroesophageal junction and esophagus. - protonix 40 mg IV bid - hemoglobin 9.3 -Given rectal bleed discussed with Dr. Tahiliani--recommending monitoring hemoglobin and transfusion as needed and PPI once patient is stable clinically might consider  colonoscopy if continues to have GI bleed  6. DVT prophylaxis with SCD          Code Status Orders  (From admission, onward)        Start     Ordered   07/14/17 2115  Full code  Continuous     07/14/17 2122    Code Status History    Date Active Date Inactive Code Status Order ID Comments User Context   06/05/2017 1654 06/08/2017 1820 Full Code [id number]  Bettey Costa, MD Inpatient   11/01/2016 0121 11/09/2016 2044 Full Code [id number]  Lance Coon, MD Inpatient       Time Spent in minutes   35 minutes  Greater than 50% of time spent in care coordination and counseling patient regarding the condition and plan of care.   Saundra Shelling M.D on 07/27/2017 at 2:49 PM  Between 7am to 6pm - Pager - [phone]  After 6pm go to [email or web address] - Proofreader  [employer]   Office  [phone]

## 2017-07-27 NOTE — Progress Notes (Signed)
Name: Kayla Boyer MRN: [id number] DOB: 05-21-1955    ADMISSION DATE:  07/14/2017  BRIEF PATIENT DESCRIPTION:  47 F hx COPD with recent hosp for GIB due to ulcers presenting with continued drop in Hgb 11 to 9.0.  Pt admitted to stepdown unit 03/15 with acute on chronic hypercapnic hypoxic respiratory failure and acute encephalopathy secondary to AECOPD requiring Bipap.     SIGNIFICANT EVENTS/STUDIES  03/15-Pt admitted to stepdown unit on with acute blood loss anemia, acute encephalopathy, and acute on chronic hypoxic hypercapnic respiratory failure on Bipap  03/17-Pt transferred to [facility] unit 03/18-Pt underwent EGD results revealed normal duodenal bulb and second portion of the  duodenum, duodenal lipoma, non-bleeding gastric ulcer with a clean ulcer base, and normal gastroesophageal junction and esophagus. Rapid response called pt developed acute encephalopathy secondary to acute on chronic hypercapnic respiratory failure likely secondary to sedating medications received during EGD requiring Bipap.  03/19 still requiring intermittent BiPAP 03/20 more awake and alert.  Cognition intact.  Still requiring intermittent BiPAP 03/20 TSH:  0.345 (0.350 - 4.50), free T4: 1.20 (0.61 - 1.12), free T3: 1.3 (2.0 - 4.4) 03/21 still requiring intermittent BiPAP for apparent hypoventilation with lethargy.  3/23 NGT place and nutrition started ( no Duodenal tubes available) 3/24 CT head to evaluate left arm weakness and symmetrical pupils 3/28 Tracheostomy   SUBJECTIVE:  She remains awake and reports that her breathing is better.  VITAL SIGNS: Temp:  [98.1 F (36.7 C)-98.7 F (37.1 C)] 98.7 F (37.1 C) (03/28 1600) Pulse Rate:  [82-106] 95 (03/28 1800) Resp:  [14-24] 15 (03/28 1800) BP: (102-154)/(55-110) 103/65 (03/28 1800) SpO2:  [91 %-100 %] 100 % (03/28 1800) FiO2 (%):  [21 %-40 %] 24 % (03/28 1628) Weight:  [131 lb 13.4 oz (59.8 kg)] 131 lb 13.4 oz (59.8 kg) (03/28 0500)  PHYSICAL  EXAMINATION: NEURO: remains  awake and interactive HEENT: NCAT, sclerae white, trach site clear of secretions Diffusely diminished breath sounds at bases, no wheezes or other adventitious sounds Heart sounds, regular, no Murmurs Obese, soft, nontender Extremities warm with 1+edema- upper and lower Cranial nerves intact but very deaf, left arm weakness , right pupil> left Lower extremities 2/5 bilateral  Recent Labs  Lab 07/25/17 0743 07/25/17 2130 07/26/17 0502 07/27/17 0401  NA 139  --  141 138  K 2.8* 4.3 4.4 3.9  CL 89*  --  95* 92*  CO2 40*  --  39* 38*  BUN 12  --  9 12  CREATININE <0.30*  --  <0.30* <0.30*  GLUCOSE 119*  --  127* 112*   Recent Labs  Lab 07/23/17 0541 07/25/17 0743 07/26/17 0502  HGB 9.0* 9.8* 8.7*  HCT 28.9* 31.2* 26.0*  WBC 14.5* 11.5* 9.3  PLT 234 373 361   CXR: Mild bibasilar atelectasis with very small bilateral effusions  ASSESSMENT / PLAN: 1. Acute on chronic hypercapnic respiratory failure due primarily to impaired ventilatory drive and global muscle weakness- dependent on NIPPV. S/P tracheostomy today - Sick Euthyroid will need repeat when she is well. 2. Moderate Malnutrition- protein and caloric - planning for G tube early next week. 3. Severe deconditioning with global muscle weakness/ deconditioning causing respiratory impairment 3. Possible CVA- left arm weakness, drooling. CT head negative but patient unable to tolerate MRI safely.    Plan;  1. Continue vent support and start SBT in Am, trach care.  2. Continue on NGT feeding, for G- tube placement early next week 3.  Care manager consult  for placement 4. Recall PT/OT  Family: Son updated    Cammie Sickle, MD PCCM service Pager [phone] 07/27/2017 7:02 PM

## 2017-07-27 NOTE — Progress Notes (Signed)
PT Cancellation Note  Patient Details Name: Kayla Boyer MRN: [id number] DOB: 09-18-1955   Cancelled Treatment:    Reason Eval/Treat Not Completed: Patient at procedure or test/unavailable. Order received.  Chart reviewed.  Per RN, pt is out of room for PEG tube placement and requested that she be contacted after 2 pm today to discuss pt's appropriateness for participation.  PT will re-attempt at a later time.   Roxanne Gates, PT, DPT 07/27/2017, 8:41 AM

## 2017-07-27 NOTE — Care Management (Addendum)
RNCM spoke with Kayla Boyer [phone] and he states he sent bank statements to [facility] with Medicaid office today ext [phone]. RNCM left message for Kayla Boyer to confirm. CSW updated. At 1022: RNCM received notification that Kayla Boyer did receive financial statements and has forwarded to DSS. RNCM gave Silvestre Moment CSW contact to let her know as soon as she finds out patient status.

## 2017-07-27 NOTE — Anesthesia Postprocedure Evaluation (Signed)
Anesthesia Post Note  Patient: Kayla Boyer  Procedure(s) Performed: TRACHEOSTOMY (N/A )  Patient location during evaluation: PACU Anesthesia Type: General Level of consciousness: awake and alert Pain management: pain level controlled Vital Signs Assessment: post-procedure vital signs reviewed and stable Respiratory status: spontaneous breathing, nonlabored ventilation, respiratory function stable and patient connected to nasal cannula oxygen Cardiovascular status: blood pressure returned to baseline and stable Postop Assessment: no apparent nausea or vomiting Anesthetic complications: no     Last Vitals:  Vitals:   07/27/17 1100 07/27/17 1200  BP: (!) 148/110 (!) 154/104  Pulse: 82 83  Resp: 15 15  Temp:    SpO2: 100% 100%    Last Pain:  Vitals:   07/27/17 0700  TempSrc: Axillary                 Shantella Blubaugh S

## 2017-07-27 NOTE — Anesthesia Post-op Follow-up Note (Signed)
Anesthesia QCDR form completed.        

## 2017-07-27 NOTE — Progress Notes (Signed)
PT Cancellation Note  Patient Details Name: Skyylar Kopf MRN: [id number] DOB: 20-Jul-1955   Cancelled Treatment:    Reason Eval/Treat Not Completed: Patient not medically ready. Order received.  Chart reviewed.  Per RN, pt has just returned from a tracheostomy and is unable to actively participate in a full PT evaluation.  Will re-attempt tomorrow.   Roxanne Gates, PT, DPT 07/27/2017, 2:14 PM

## 2017-07-27 NOTE — Clinical Social Work Note (Signed)
CSW received phone call from financial services representative, Lizbeth Bark, this morning and patient's son has obtained the bank statements and turned them in to [place]. Apolonio Schneiders stated that she will be fowarding the information to her medicaid caseworker at [place] and will be continuing to follow up regarding status of disability and medicaid. Shela Leff MSW,LCSW [phone]

## 2017-07-27 NOTE — Progress Notes (Signed)
Discussed procedure with son again and patient.  Anemic and questionable recent CVA with persistent weakness but negative CT.  Unable to tolerate MRI due to respiratory status.  Patient at high risk for post-operative complications and son understands.  Will proceed with planned tracheostomy.

## 2017-07-27 NOTE — Progress Notes (Signed)
OT Cancellation Note  Patient Details Name: Kayla Boyer MRN: [id number] DOB: [birth year]   Cancelled Treatment:    Reason Eval/Treat Not Completed: Patient at procedure or test/ unavailable. Per chart review, pt undergoing tracheostomy this am. Will re-attempt at later date/time as pt is available and medically appropriate.  Jeni Salles, MPH, MS, OTR/L ascom [phone] 07/27/17, 10:57 AM

## 2017-07-27 NOTE — Anesthesia Preprocedure Evaluation (Signed)
Anesthesia Evaluation  Patient identified by MRN, date of birth, ID band Patient awake    Reviewed: Allergy & Precautions, NPO status , Patient's Chart, lab work & pertinent test results, reviewed documented beta blocker date and time   Airway Mallampati: III  TM Distance: >3 FB     Dental  (+) Chipped   Pulmonary COPD,           Cardiovascular      Neuro/Psych    GI/Hepatic   Endo/Other    Renal/GU      Musculoskeletal   Abdominal   Peds  Hematology  (+) anemia ,   Anesthesia Other Findings On Bipap, L arm weak. Hb 8.7. PVCs.   Reproductive/Obstetrics                             Anesthesia Physical Anesthesia Plan  ASA: III  Anesthesia Plan: General   Post-op Pain Management:    Induction: Intravenous  PONV Risk Score and Plan:   Airway Management Planned: Oral ETT  Additional Equipment:   Intra-op Plan:   Post-operative Plan:   Informed Consent: I have reviewed the patients History and Physical, chart, labs and discussed the procedure including the risks, benefits and alternatives for the proposed anesthesia with the patient or authorized representative who has indicated his/her understanding and acceptance.     Plan Discussed with: CRNA  Anesthesia Plan Comments:         Anesthesia Quick Evaluation

## 2017-07-27 NOTE — Progress Notes (Signed)
Nutrition Follow-up  DOCUMENTATION CODES:   Not applicable  INTERVENTION:  Initiate new goal tube feed regimen of Vital High Protein at 45 mL/hr (1080 mL goal daily volume). Provides 1080 kcal, 95 grams of protein, 907 mL H2O daily. With current rate of D5W provides 1284 kcal daily.  Will discontinue Pro-Stat.  Continue liquid MVI and thiamine 100 mg daily per tube.  NUTRITION DIAGNOSIS:   Increased nutrient needs related to catabolic illness(acute exacerbation of COPD) as evidenced by estimated needs.  Ongoing.  GOAL:   Patient will meet greater than or equal to 90% of their needs  Met with TF regimen.  MONITOR:   PO intake, Labs, Weight trends, TF tolerance, I & O's  REASON FOR ASSESSMENT:   Malnutrition Screening Tool, Consult Enteral/tube feeding initiation and management  ASSESSMENT:   62 year old female with PMHx of HLD, GI bleed, emphysema of lung, COPD who was admitted 3/15 with acute blood loss anemia, acute encephalopathy, and acute on chronic hypercapnic respiratory failure secondary to acute exacerbation of COPD requiring BiPAP. On 3/17 she was able to transfer to [facility] unit. She underwent EGD on 3/18 and a rapid response was called due to respiratory failure likely due to sedating medications once again requiring BiPAP.   -Patient s/p tracheostomy tube placement today. -Still pending PEG tube placement.  After tracheostomy placement today patient came back on ventilator.   Access: 14 Fr. NGT placed 3/23; terminates in distal stomach per abdominal x-ray 3/23; 60 cm at left nare  MAP: 89-122 mmHg  TF: pt tolerating Vital 1.5 at 40 mL/hr + Pro-Stat 30 mL BID  Patient is currently intubated on ventilator support MV: 6.8 L/min Temp (24hrs), Avg:98.4 F (36.9 C), Min:98.1 F (36.7 C), Max:98.6 F (37 C)  Propofol: N/A  Medications reviewed and include: Senokot, Colace, liquid MVI daily per tube, Protonix, thiamine 100 mg daily per tube, D5 at 50  mL/hr (60 grams dextrose, 204 kcal daily).  Labs reviewed: CBG 89-172, Chloride 92, CO2 38, Creatinine <0.3.  I/O: 950 mL UOP yesterday (950 mL/kg/hr)  Weight trend: 59.8 kg on 3/28; stable from weight on 3/17  Discussed with RN and on rounds.  Diet Order:  Diet NPO time specified  EDUCATION NEEDS:   Not appropriate for education at this time  Skin:  Skin Assessment: Skin Integrity Issues:(scattered ecchymosis) Skin Integrity Issues:: Incisions Incisions: closed incision neck  Last BM:  07/25/2017 - large type 6  Height:   Ht Readings from Last 1 Encounters:  07/14/17 5' 2.5" (1.588 m)    Weight:   Wt Readings from Last 1 Encounters:  07/27/17 131 lb 13.4 oz (59.8 kg)    Ideal Body Weight:  51.1 kg  BMI:  Body mass index is 23.73 kg/m.  Estimated Nutritional Needs:   Kcal:  6945 (PSU 2003b w/ MSJ 1120, Ve 6.8, Tmax 37)  Protein:  78-96 grams (1.3-1.6 grams/kg)  Fluid:  1.5-1.8 L/day (25-30 mL/kg)  Willey Blade, MS, RD, LDN Office: [phone] Pager: [phone] After Hours/Weekend Pager: [phone]

## 2017-07-27 NOTE — Progress Notes (Signed)
S/p tracheostomy with Size 6 cuffed shiley.  Moderate amount of clear fluid suctioned from lungs/trachea after placement.  Recommend post-operative CXR to evaluate for post-obstructive edema.  OK to remove sutures in 5 days and OK to remove gauze on 3/29 and then change PRN.

## 2017-07-27 NOTE — Progress Notes (Signed)
Pharmacy Electrolyte Monitoring Consult:  Pharmacy consulted to assist in monitoring and replacing electrolytes in this 62 y.o. female admitted on 07/14/2017 with Weakness   Labs:  Sodium (mmol/L)  Date Value  07/27/2017 138   Potassium (mmol/L)  Date Value  07/27/2017 3.9   Magnesium (mg/dL)  Date Value  07/26/2017 1.7   Phosphorus (mg/dL)  Date Value  07/26/2017 3.4   Calcium (mg/dL)  Date Value  07/27/2017 8.8 (L)   Albumin (g/dL)  Date Value  07/26/2017 2.5 (L)    Plan: Advanced feeds started 3/25.   Electrolytes WNL. No replacement needed at this time.  Follow up electrolytes with AM labs tomorrow.  Pharmacy to continue to monitor and replace per protocol.  Candelaria Stagers, PharmD Pharmacy Resident  07/27/2017 7:20 AM

## 2017-07-27 NOTE — Transfer of Care (Signed)
Immediate Anesthesia Transfer of Care Note  Patient: Kayla Boyer  Procedure(s) Performed: TRACHEOSTOMY (N/A )  Patient Location: PACU and ICU  Anesthesia Type:General  Level of Consciousness: sedated  Airway & Oxygen Therapy: Patient connected to tracheostomy mask oxygen  Post-op Assessment: Report given to RN and Post -op Vital signs reviewed and stable  Post vital signs: Reviewed and stable  Last Vitals:  Vitals Value Taken Time  BP 114/70 07/27/2017 10:15 AM  Temp    Pulse 85 07/27/2017 10:15 AM  Resp 16 07/27/2017 10:15 AM  SpO2 100 % 07/27/2017 10:15 AM    Last Pain:  Vitals:   07/27/17 0700  TempSrc: Axillary         Complications: No apparent anesthesia complications

## 2017-07-27 NOTE — Op Note (Signed)
..  07/27/2017 10:01 AM  [name] [id number]  Pre-Op Dx: Respiratory Failure  Post-Op Dx:  same  Proc:  Tracheostomy  Surg:  Cornella Emmer  Anes:  GOT  EBL:  <63ml  Comp:  none  Findings:  Size 6 cuffed shiley placed in tracheal ring 2-3, modearte amount of clear fluid suctioned from trachea after placement.  Procedure:  The patient was brought from the intensive care unit to the operating room and transferred to an operating table.  Anesthesia was administered per indwelling orotracheal tube.   Neck extension was achieved as possible anda shoulder rolke was placed.  The lower neck was palpated with the findings as described above.  1% Xylocaine with 1:100,000 epinephrine, 7 cc's, was infiltrated into the surgical field for intraoperative hemostasis.  Several minutes were allowed for this to take effect. The patient was prepped in a sterile fashion with a surgical prep from the chin down to the upper chest.  Sterile draping was accomplished in the standard fashion.  A  5 cm horizontal incision was made sharply a finger's breadth above the sternal notch, and extended through skin and subcutaneous fat.  Using cautery, the superficial layer of the deep cervical fascia was lysed.  Additional dissection revealed the strap muscles.  The midline raphe was divided in two layers and the muscles retracted laterally.  The pretracheal plane was visualized.  This was entered bluntly.  The thyroid isthmus was isolated and divided with the Harmonic scalpel.  The thyroid gland was retracted to either side.  The anterior face of the trachea was cleared.  In the  2nd-3rd interspace, a transverse incision was made between cartilage rings into the tracheal lumen.  A 6 mm wide inferiorly based flap was generated.   A previously tested  # 6 Shiley cuffed tracheostomy tube was brought into the field.  With the endotracheal tube under direct visualization through the tracheostomy, it was gently backed up.  The  tracheostomy tube was inserted into the tracheal lumen.  Hemostasis was observed. The cuff was inflated and observed to be intact and containing pressure. The inner cannula was placed and ventilation assumed per tracheostomy tube.  Good chest wall motion was observed, and CO2 was documented per anesthesia.  The trach tube was secured in the standard fashion with trach ties. A 2-0 Nylon suture was used to secure the trach tube to the skin on both sides.  Hemostasis was observed again.  When satisfactory ventilation was assured, the orotracheal tube was removed.  At this point the procedure was completed.  The patient was returned to anesthesia, awakened as possible, and transferred back to the intensive care unit in stable condition.  Comment: 62 y.o. female with prolonged ventilation was the indication for today's procedure.  Anticipate a routine postoperative recovery including standard tracheal hygiene.  The sutures should be removed in 5 days.  When the patient no longer requires ventilator or pressure support, the cuff should be deflated.  Changing to an uncuffed tube and downsizing will be according to the clinical condition of the patient.   Sadiq Mccauley  10:01 AM 07/27/2017

## 2017-07-28 ENCOUNTER — Encounter: Payer: Self-pay | Admitting: Otolaryngology

## 2017-07-28 DIAGNOSIS — R633 Feeding difficulties: Secondary | ICD-10-CM

## 2017-07-28 LAB — BASIC METABOLIC PANEL
ANION GAP: 10 (ref 5–15)
BUN: 14 mg/dL (ref 6–20)
CO2: 33 mmol/L — ABNORMAL HIGH (ref 22–32)
Calcium: 8.8 mg/dL — ABNORMAL LOW (ref 8.9–10.3)
Chloride: 92 mmol/L — ABNORMAL LOW (ref 101–111)
Creatinine, Ser: <0.3 mg/dL — ABNORMAL LOW (ref 0.44–1.00)
Glucose, Bld: 138 mg/dL — ABNORMAL HIGH (ref 65–99)
POTASSIUM: 3.9 mmol/L (ref 3.5–5.1)
Sodium: 135 mmol/L (ref 135–145)

## 2017-07-28 LAB — GLUCOSE, CAPILLARY
GLUCOSE-CAPILLARY: 100 mg/dL — AB (ref 65–99)
Glucose-Capillary: 120 mg/dL — ABNORMAL HIGH (ref 65–99)
Glucose-Capillary: 125 mg/dL — ABNORMAL HIGH (ref 65–99)
Glucose-Capillary: 134 mg/dL — ABNORMAL HIGH (ref 65–99)
Glucose-Capillary: 144 mg/dL — ABNORMAL HIGH (ref 65–99)

## 2017-07-28 LAB — MAGNESIUM: MAGNESIUM: 1.6 mg/dL — AB (ref 1.7–2.4)

## 2017-07-28 MED ORDER — VITAL HIGH PROTEIN PO LIQD
1000.0000 mL | ORAL | Status: DC
  Administered 2017-07-28 – 2017-08-07 (×10): 1000 mL

## 2017-07-28 MED ORDER — FREE WATER
200.0000 mL | Freq: Four times a day (QID) | Status: DC
  Administered 2017-07-28 – 2017-08-07 (×38): 200 mL

## 2017-07-28 MED ORDER — MAGNESIUM SULFATE 2 GM/50ML IV SOLN
2.0000 g | Freq: Once | INTRAVENOUS | Status: AC
  Administered 2017-07-28: 2 g via INTRAVENOUS
  Filled 2017-07-28: qty 50

## 2017-07-28 NOTE — Progress Notes (Signed)
Kayla Antigua, MD [address] [place], [place], [place], [address] [address], [place], [place], [place], [address] Phone: [phone]  Fax: [phone]   Subjective:  Patient now status post tracheostomy placement.  Awake, answers questions appropriately  Objective: Exam: Vital signs in last 24 hours: Vitals:   07/28/17 0753 07/28/17 0800 07/28/17 0900 07/28/17 1405  BP:  104/65 126/70   Pulse:  (!) 110 (!) 110 (!) 158  Resp:  15 17 19   Temp:      TempSrc:      SpO2: 99% 98% 96% 95%  Weight:      Height:       Weight change:   Intake/Output Summary (Last 24 hours) at 07/28/2017 1423 Last data filed at 07/28/2017 0900 Gross per 24 hour  Intake 1708 ml  Output 1050 ml  Net 658 ml    General: No acute distress Abd: Soft, NT/ND, No HSM Skin: Warm, no rashes Neck: Supple, Trachea midline   Lab Results: Lab Results  Component Value Date   WBC 9.3 07/26/2017   HGB 8.7 (L) 07/26/2017   HCT 26.0 (L) 07/26/2017   MCV 91.3 07/26/2017   PLT 361 07/26/2017   Micro Results: No results found for this or any previous visit (from the past 240 hour(s)). Studies/Results: Dg Chest Port 1 View  Result Date: 07/27/2017 CLINICAL DATA:  Excessive fluid EXAM: PORTABLE CHEST 1 VIEW COMPARISON:  Chest x-ray of July 23, 2017 FINDINGS: The lungs are well-expanded. There is a trace of pleural fluid on the right and slightly more on the left. The retrocardiac region remains dense. The cardiac silhouette is enlarged. The central pulmonary vascularity is prominent. The mediastinum is normal in width. The trachea is midline. The right-sided PICC line tip projects over the midportion of the SVC. The tracheostomy tube tip projects at the superior margin of the clavicular heads. The bony thorax exhibits no acute abnormality. IMPRESSION: Slight improved aeration of both lungs today. Small amounts of pleural fluid at both bases. Persistent atelectasis or pneumonia in the left lower  lobe. Top-normal cardiac size with central pulmonary vascular congestion but no definite pulmonary edema. Electronically Signed   By: David  Martinique M.D.   On: 07/27/2017 10:55   Medications:  Scheduled Meds: . chlorhexidine  15 mL Mouth Rinse BID  . sennosides  5 mL Per Tube BID   And  . docusate  50 mg Per Tube BID  . free water  200 mL Per Tube Q6H  . insulin aspart  0-9 Units Subcutaneous Q6H  . ipratropium-albuterol  3 mL Nebulization Q4H  . mouth rinse  15 mL Mouth Rinse q12n4p  . multivitamin  15 mL Per Tube Daily  . pantoprazole sodium  40 mg Per Tube BID  . sodium chloride flush  10-40 mL Intracatheter Q12H  . thiamine  100 mg Per Tube Daily  . thyroid  30 mg Per Tube QAC breakfast   Continuous Infusions: . dextrose 50 mL/hr at 07/27/17 1408  . feeding supplement (VITAL HIGH PROTEIN) 1,000 mL (07/28/17 1418)   PRN Meds:.acetaminophen (TYLENOL) oral liquid 160 mg/5 mL, acetaminophen **OR** acetaminophen, ALPRAZolam, bisacodyl, metoprolol tartrate, morphine injection, [DISCONTINUED] ondansetron **OR** ondansetron (ZOFRAN) IV, sodium chloride flush   Assessment: Active Problems:   GI bleed   Chronic gastric ulcer without hemorrhage and without perforation   Acute respiratory failure (HCC)   Goals of care, counseling/discussion   Palliative care encounter   Acute on chronic respiratory failure with hypercapnia ([place])  Protein-calorie malnutrition ([place])   At high risk for aspiration   Palliative care by specialist    Plan: Patient is status post tracheostomy placement Pt is now alert and understand and answers questions well While I am conversing with her, she is noted to be swallowing her secretions without difficulty.  In this setting, she may be able to tolerate somewhat of an oral diet.  Since she is able to follow commands, would recommend swallow evaluation, in next 1-2 days.   If she is able to tolerate an oral diet a feeding tube and its potential  complications could be avoided.  If she is not able to maintain adequate nutrition, timing of feeding tube placement to be to be determined after clinical status observed over the next 1-2 days  In addition, we have yet to determine if she can come off the vent now that she has a Therapist, nutritional. If she is off the vent, her ability to tolerate oral diet is likely to improve over time as well.   Patient was signed out to Dr. Vicente Males, and he will follow over the weekend, and Dr. Marius Ditch next week.    LOS: 14 days   Kayla Antigua, MD 07/28/2017, 2:23 PM

## 2017-07-28 NOTE — Care Management (Signed)
RNCM received call from Kayla Boyer regarding discharge planning and progression status of patient.  Medicaid has received financial statements for review.  Barriers to transition include: trach/vent dependency, Medicaid application, and availability of facility needed at discharge. RNCM spoke with ICU RN regarding plans with trach/vent. Per RN patient for trach trial and long-term plan still being established. Trach placed 07/27/17 # 6 Shiley cuffed tracheostomy tube.

## 2017-07-28 NOTE — Progress Notes (Signed)
PT Cancellation Note  Patient Details Name: Kayla Boyer MRN: [id number] DOB: 10/06/1955   Cancelled Treatment:    Reason Eval/Treat Not Completed: (Consult re-attempted.  Cleared for participation with session by primary RN, but patient declined at this time.  Reports overall fatigue and inability to tolerate exertional activity after recent bath. Will continue efforts next date as appropriate.)   Linnell Swords H. Owens Shark, PT, DPT, NCS 07/28/17, 3:10 PM [phone]

## 2017-07-28 NOTE — Progress Notes (Signed)
Nutrition Follow-up  DOCUMENTATION CODES:   Not applicable  INTERVENTION:  Increase VHP to 62m/hr Provides 1200 calories, 105 grams of protein, 10051mfree water 20072mree water every 6 hours  Provides total free water volume of 1803m74meplete Mg (1.6)  NUTRITION DIAGNOSIS:   Increased nutrient needs related to catabolic illness(acute exacerbation of COPD) as evidenced by estimated needs. -ongoing  GOAL:   Patient will meet greater than or equal to 90% of their needs -met with TF  MONITOR:   PO intake, Labs, Weight trends, TF tolerance, I & O's  ASSESSMENT:   61 y46r old female with PMHx of HLD, GI bleed, emphysema of lung, COPD who was admitted 3/15 with acute blood loss anemia, acute encephalopathy, and acute on chronic hypercapnic respiratory failure secondary to acute exacerbation of COPD requiring BiPAP. [facility] underwent EGD on 3/18 and a rapid response was called due to respiratory failure likely due to sedating medications once again requiring BiPAP.   3/28 - Trached  Discussed in rounds, CCU MD desires to take patient off D5, maintain blood sugar between 140-180, and provide water via tube feed. Will increase vital high protein and monitor blood sugars. Will start on pressure support today and possible SBTs. Monitor tolerance and extubation. Possible PEG.  Patient is currently intubated on ventilator support MV: 5.5 L/min Temp (24hrs), Avg:98.7 F (37.1 C), Min:98.1 F (36.7 C), Max:99 F (37.2 C) Propofol: None   Intake/Output Summary (Last 24 hours) at 07/28/2017 1240 Last data filed at 07/28/2017 0900 Gross per 24 hour  Intake 1903.83 ml  Output 2150 ml  Net -246.17 ml  2050mL18m x24 hrs 11.8L Fluid Positive  NGT tip to stomach, 60cm   Labs reviewed:  Mg 1.6 Medications reviewed and include:  Multivitamin liquid, Thiamine, Insulin (1 unit Q6H), Colace, Senokot  Diet Order:  Diet NPO time  specified  EDUCATION NEEDS:   Not appropriate for education at this time  Skin:  Skin Assessment: Skin Integrity Issues:(scattered ecchymosis) Skin Integrity Issues:: Incisions Incisions: closed incision neck  Last BM:  07/25/2017 - large type 6  Height:   Ht Readings from Last 1 Encounters:  07/14/17 5' 2.5" (1.588 m)    Weight:   Wt Readings from Last 1 Encounters:  07/27/17 131 lb 13.4 oz (59.8 kg)    Ideal Body Weight:  51.1 kg  BMI:  Body mass index is 23.73 kg/m.  Estimated Nutritional Needs:   Kcal:  1253 0240 2003b w/ MSJ 1120, Ve 6.8, Tmax 37)  Protein:  78-96 grams (1.3-1.6 grams/kg)  Fluid:  1.5-1.8 L/day (25-30 mL/kg)  WilliSatira Anisd, MS, RD LDN Inpatient Clinical Dietitian Pager [phone]

## 2017-07-28 NOTE — Progress Notes (Addendum)
   Name: Kayla Boyer MRN: [id number] DOB: 1955/06/02     CONSULTATION DATE: 07/14/2017 Subjective: Feels better.  Objectives: No noticeable active bleeding.   HISTORY OF PRESENT ILLNESS:    PAST MEDICAL HISTORY :   has a past medical history of Emphysema of lung ([place]), GI bleed, and HLD (hyperlipidemia).  has a past surgical history that includes No past surgeries; Oophorectomy (Left); Esophagogastroduodenoscopy (egd) with propofol (N/A, 07/17/2017); and Tracheostomy tube placement (N/A, 07/27/2017). Prior to Admission medications   Medication Sig Start Date End Date Taking? Authorizing Provider  feeding supplement, ENSURE ENLIVE, (ENSURE ENLIVE) LIQD Take 237 mLs by mouth 2 (two) times daily between meals. 06/08/17   Vaughan Basta, MD  ferrous sulfate 325 (65 FE) MG tablet Take 1 tablet (325 mg total) by mouth 2 (two) times daily with a meal. 06/08/17   Vaughan Basta, MD  pantoprazole (PROTONIX) 40 MG tablet Take 1 tablet (40 mg total) by mouth 2 (two) times daily before a meal. 06/08/17   Vaughan Basta, MD  vitamin B-12 (CYANOCOBALAMIN) 1000 MCG tablet Take 1,000 mcg by mouth daily.    [provider]  vitamin C (ASCORBIC ACID) 500 MG tablet Take 500 mg by mouth daily.    [provider]   No Known Allergies  FAMILY HISTORY:  Family history is unknown by patient. SOCIAL HISTORY:  reports that she has never smoked. She has never used smokeless tobacco. She reports that she does not drink alcohol or use drugs.  REVIEW OF SYSTEMS:   Unable to obtain due to critical illness   VITAL SIGNS: Temp:  [98.1 F (36.7 C)-99 F (37.2 C)] 98.5 F (36.9 C) (03/29 1200) Pulse Rate:  [93-160] 117 (03/29 1500) Resp:  [15-26] 26 (03/29 1500) BP: (96-133)/(59-90) 116/78 (03/29 1500) SpO2:  [87 %-100 %] 100 % (03/29 1500) FiO2 (%):  [24 %] 24 % (03/29 1405)  Physical Examination:  Awake and oriented and nonfocal neurological exam Tracheostomy in  place, tolerating PR VC, distress, BEAE no rales.  Minimal clear secretions S1 + S2 audible and no murmur Benign abdominal exam was normal peristalsis.  Tolerating tube feed No leg edema   ASSESSMENT / PLAN:  Acute on chronic respiratory failure.  Tracheostomy was placed on 3/28/201 -Vent and trach weaning as tolerated  GI bleeding with history of chronic gastric ulcer -PPI management as per GI.  Status post EGD colonoscopy is a consideration if he continues to have GI bleeding as per GI  Atelectasis.  Bibasilar airspace disease -Monitor CXR + CBC + FiO2 and consider starting empiric antimicrobial if develops SIRS.  Dysphagia -As per GI patient was able to swallow her secretions and recommendation to repeat swallow evaluation in the next 48 hours.  Feeding tube placement is considered if she continue to have dysphagia  Full code  DVT + GI prophylaxis.  Continue with supportive care.  Critical care time 35 minutes

## 2017-07-28 NOTE — Progress Notes (Addendum)
[facility]                                                                                                                                                                                  Patient Demographics   Kayla Boyer, is a 62 y.o. female, DOB - 1956-01-02, OVF:[id number]  Admit date - 07/14/2017   Admitting Physician Saundra Shelling, MD  Outpatient Primary MD for the patient is Patient, [name]   LOS - 14  Subjective: Patient seen and evaluated she is s/p tracheostomy with ventilator support PEEP 5 PS 15 FIO2 24 % Awake and responds to verbal commands Has generalized weakness  Review of Systems:  On ventilator support via tracheostomy Opens eyes and communicate some verbal commands Complete review of systems not possible  Vitals:   Vitals:   07/28/17 0700 07/28/17 0753 07/28/17 0800 07/28/17 0900  BP: 100/62  104/65 126/70  Pulse: (!) 115  (!) 110 (!) 110  Resp: 16  15 17   Temp: 99 F (37.2 C)     TempSrc: Oral     SpO2: 95% 99% 98% 96%  Weight:      Height:        Wt Readings from Last 3 Encounters:  07/27/17 59.8 kg (131 lb 13.4 oz)  07/05/17 60.8 kg (134 lb)  07/04/17 60.8 kg (134 lb)     Intake/Output Summary (Last 24 hours) at 07/28/2017 1352 Last data filed at 07/28/2017 0900 Gross per 24 hour  Intake 1903.83 ml  Output 2150 ml  Net -246.17 ml    Physical Exam:   GENERAL: 62 year old female patient lying on the bed in the stepdown unit on BiPAP HEAD, EYES, EARS, NOSE AND THROAT: Atraumatic, normocephalic. Extraocular muscles are intact. Pupils equal and reactive to light. Sclerae anicteric. No conjunctival injection. No oro-pharyngeal erythema.  NECK: Supple. There is no jugular venous distention. No bruits, no lymphadenopathy, no thyromegaly.  S/p tracheostomy  HEART: Regular rate and rhythm,. No murmurs, no rubs, no clicks.  LUNGS: Clear to auscultation bilaterally. No rales or rhonchi. No wheezes.  On  ventilator support via tracheostomy ABDOMEN: Soft, flat, nontender, nondistended. Has good bowel sounds. No hepatosplenomegaly appreciated.  EXTREMITIES: No evidence of any cyanosis, clubbing.  +2 pedal and radial pulses bilaterally.  1+ pedal edema NEUROLOGIC: The patient is alert, arousable, and oriented x2 with   Decreased hearing, left arm weakness improved Right pupil greater than left pupil SKIN: Moist and warm with no rashes appreciated.  Psych: Not be assessed LN: No inguinal LN enlargement    Antibiotics   Anti-infectives (From admission, onward)   Start     Dose/Rate  Route Frequency Ordered Stop   07/14/17 2200  azithromycin (ZITHROMAX) 500 mg in sodium chloride 0.9 % 250 mL IVPB  Status:  Discontinued     500 mg 250 mL/hr over 60 Minutes Intravenous Every 24 hours 07/14/17 2122 07/18/17 0915      Medications   Scheduled Meds: . chlorhexidine  15 mL Mouth Rinse BID  . sennosides  5 mL Per Tube BID   And  . docusate  50 mg Per Tube BID  . free water  200 mL Per Tube Q6H  . insulin aspart  0-9 Units Subcutaneous Q6H  . ipratropium-albuterol  3 mL Nebulization Q4H  . mouth rinse  15 mL Mouth Rinse q12n4p  . multivitamin  15 mL Per Tube Daily  . pantoprazole sodium  40 mg Per Tube BID  . sodium chloride flush  10-40 mL Intracatheter Q12H  . thiamine  100 mg Per Tube Daily  . thyroid  30 mg Per Tube QAC breakfast   Continuous Infusions: . dextrose 50 mL/hr at 07/27/17 1408  . feeding supplement (VITAL HIGH PROTEIN)     PRN Meds:.acetaminophen (TYLENOL) oral liquid 160 mg/5 mL, acetaminophen **OR** acetaminophen, ALPRAZolam, bisacodyl, metoprolol tartrate, morphine injection, [DISCONTINUED] ondansetron **OR** ondansetron (ZOFRAN) IV, sodium chloride flush   Data Review:   Micro Results No results found for this or any previous visit (from the past 240 hour(s)).  Radiology Reports Dg Abd 1 View  Result Date: 07/22/2017 CLINICAL DATA:  Encounter for feeding  tube placement. EXAM: ABDOMEN - 1 VIEW COMPARISON:  Chest radiograph 07/22/2017 FINDINGS: A nasogastric tube has been advanced into the abdomen. Catheter tip is in the distal stomach region. Bowel gas throughout the abdomen. Persistent densities at the left lung base and the left hemidiaphragm remains obscured. IMPRESSION: Feeding tube tip in the distal stomach region. Electronically Signed   By: Markus Daft M.D.   On: 07/22/2017 16:30   Ct Head Wo Contrast  Result Date: 07/23/2017 CLINICAL DATA:  62 y/o F; changes to left pupil in strength of left arm. EXAM: CT HEAD WITHOUT CONTRAST TECHNIQUE: Contiguous axial images were obtained from the base of the skull through the vertex without intravenous contrast. COMPARISON:  None. FINDINGS: Brain: No evidence of acute infarction, hemorrhage, hydrocephalus, extra-axial collection or mass lesion/mass effect. Vascular: Calcific atherosclerosis of carotid siphons. No hyperdense vessel. Skull: Normal. Negative for fracture or focal lesion. Sinuses/Orbits: No acute finding. Other: None. IMPRESSION: Negative CT of the head. Electronically Signed   By: Kristine Garbe M.D.   On: 07/23/2017 14:33   Nm Gi Blood Loss  Result Date: 07/20/2017 CLINICAL DATA:  GI bleeding EXAM: NUCLEAR MEDICINE GASTROINTESTINAL BLEEDING SCAN TECHNIQUE: Sequential abdominal images were obtained following intravenous administration of Tc-37m labeled red blood cells. RADIOPHARMACEUTICALS:  Twenty mCi Tc-68m pertechnetate in-vitro labeled red cells. COMPARISON:  None. FINDINGS: Normal distribution of radiotracer is noted. No focal area of tracer accumulation is identified to suggest active GI bleeding is noted. IMPRESSION: No findings to suggest active GI hemorrhage. Electronically Signed   By: Inez Catalina M.D.   On: 07/20/2017 15:16   US Venous Img Upper Bilat  Result Date: 07/16/2017 CLINICAL DATA:  Bilateral upper extremity edema acutely with pain and discomfort for 1 day EXAM:  BILATERAL UPPER EXTREMITY VENOUS DOPPLER ULTRASOUND TECHNIQUE: Gray-scale sonography with graded compression, as well as color Doppler and duplex ultrasound were performed to evaluate the bilateral upper extremity deep venous systems from the level of the subclavian vein and including the jugular, axillary,  basilic, radial, ulnar and upper cephalic vein. Spectral Doppler was utilized to evaluate flow at rest and with distal augmentation maneuvers. COMPARISON:  None. FINDINGS: Exam is limited because of upper extremity subcutaneous edema. RIGHT UPPER EXTREMITY Internal Jugular Vein: No evidence of thrombus. Normal compressibility, respiratory phasicity and response to augmentation. Subclavian Vein: No evidence of thrombus. Normal compressibility, respiratory phasicity and response to augmentation. Axillary Vein: No evidence of thrombus. Normal compressibility, respiratory phasicity and response to augmentation. Cephalic Vein: No evidence of thrombus. Normal compressibility, respiratory phasicity and response to augmentation. Basilic Vein: Not well visualized to accurately assess. Brachial Veins: No evidence of thrombus. Normal compressibility, respiratory phasicity and response to augmentation. Radial Veins: No evidence of thrombus. Normal compressibility, respiratory phasicity and response to augmentation. Ulnar Veins: No evidence of thrombus. Normal compressibility, respiratory phasicity and response to augmentation. Venous Reflux:  None. Other Findings:  Peripheral edema noted LEFT UPPER EXTREMITY Internal Jugular Vein: No evidence of thrombus. Normal compressibility, respiratory phasicity and response to augmentation. Subclavian Vein: No evidence of thrombus. Normal compressibility, respiratory phasicity and response to augmentation. Axillary Vein: No evidence of thrombus. Normal compressibility, respiratory phasicity and response to augmentation. Cephalic Vein: No evidence of thrombus. Normal compressibility,  respiratory phasicity and response to augmentation. Basilic Vein: No evidence of thrombus. Normal compressibility, respiratory phasicity and response to augmentation. Brachial Veins: No evidence of thrombus. Normal compressibility, respiratory phasicity and response to augmentation. Radial Veins: No evidence of thrombus. Normal compressibility, respiratory phasicity and response to augmentation. Ulnar Veins: No evidence of thrombus. Normal compressibility, respiratory phasicity and response to augmentation. Venous Reflux:  None. Other Findings:  But peripheral subcutaneous edema noted. IMPRESSION: Limited exam but no gross occlusive upper extremity DVT in either arm. Electronically Signed   By: Jerilynn Mages.  Shick M.D.   On: 07/16/2017 12:34   US Venous Img Upper Uni Left  Result Date: 07/25/2017 CLINICAL DATA:  Left upper extremity edema. EXAM: LEFT UPPER EXTREMITY VENOUS DOPPLER ULTRASOUND TECHNIQUE: Gray-scale sonography with graded compression, as well as color Doppler and duplex ultrasound were performed to evaluate the upper extremity deep venous system from the level of the subclavian vein and including the jugular, axillary, basilic, radial, ulnar and upper cephalic vein. Spectral Doppler was utilized to evaluate flow at rest and with distal augmentation maneuvers. COMPARISON:  None. FINDINGS: Contralateral Subclavian Vein: Respiratory phasicity is normal and symmetric with the symptomatic side. No evidence of thrombus. Normal compressibility. Internal Jugular Vein: No evidence of thrombus. Normal compressibility, respiratory phasicity and response to augmentation. Subclavian Vein: No evidence of thrombus. Normal compressibility, respiratory phasicity and response to augmentation. Axillary Vein: No evidence of thrombus. Normal compressibility, respiratory phasicity and response to augmentation. Cephalic Vein: No evidence of thrombus. Normal compressibility, respiratory phasicity and response to augmentation.  Basilic Vein: No evidence of thrombus. Normal compressibility, respiratory phasicity and response to augmentation. Brachial Veins: No evidence of thrombus. Normal compressibility, respiratory phasicity and response to augmentation. Radial Veins: No evidence of thrombus. Normal compressibility, respiratory phasicity and response to augmentation. Ulnar Veins: No evidence of thrombus. Normal compressibility, respiratory phasicity and response to augmentation. Venous Reflux:  None visualized. Other Findings: There is significant edema noted in the left upper arm including some more locally marginated fluid in the subcutaneous tissues of the left medial upper arm. This fluid appears to extend over a long distance and likely represents subcutaneous fluid which is more prominent in 1 area of the upper arm. If there is any concern for soft tissue infection, additional imaging may  be warranted. IMPRESSION: 1. No evidence of left upper extremity deep venous thrombosis. 2. Significant subcutaneous fluid in the left upper arm with an area of more marginated subcutaneous fluid also identified in the medial left upper arm. Although this does not have the appearance of a focal abscess by ultrasound, if there is concern for upper extremity infection, additional imaging may be warranted with CT and/or MRI. Electronically Signed   By: Aletta Edouard M.D.   On: 07/25/2017 17:48   Dg Chest Port 1 View  Result Date: 07/27/2017 CLINICAL DATA:  Excessive fluid EXAM: PORTABLE CHEST 1 VIEW COMPARISON:  Chest x-ray of July 23, 2017 FINDINGS: The lungs are well-expanded. There is a trace of pleural fluid on the right and slightly more on the left. The retrocardiac region remains dense. The cardiac silhouette is enlarged. The central pulmonary vascularity is prominent. The mediastinum is normal in width. The trachea is midline. The right-sided PICC line tip projects over the midportion of the SVC. The tracheostomy tube tip projects at  the superior margin of the clavicular heads. The bony thorax exhibits no acute abnormality. IMPRESSION: Slight improved aeration of both lungs today. Small amounts of pleural fluid at both bases. Persistent atelectasis or pneumonia in the left lower lobe. Top-normal cardiac size with central pulmonary vascular congestion but no definite pulmonary edema. Electronically Signed   By: David  Martinique M.D.   On: 07/27/2017 10:55   Dg Chest Port 1 View  Result Date: 07/23/2017 CLINICAL DATA:  Acute on chronic respiratory failure. Airway aspiration. EXAM: PORTABLE CHEST 1 VIEW COMPARISON:  07/22/2017. FINDINGS: Interval enlargement of the cardiac silhouette. Stable dense left lower lobe airspace opacity. Interval increased linear and patchy density at the right lung base. Stable small bilateral pleural effusions. Nasogastric tube extending into the stomach. Thoracic spine degenerative changes. IMPRESSION: 1. No significant change in dense left lower lobe atelectasis or pneumonia and small left pleural effusion. 2. Increased right basilar atelectasis or pneumonia with a stable small right pleural effusion. Electronically Signed   By: Claudie Revering M.D.   On: 07/23/2017 16:09   Dg Chest Port 1 View  Result Date: 07/22/2017 CLINICAL DATA:  Respiratory failure.  COPD. EXAM: PORTABLE CHEST 1 VIEW COMPARISON:  07/19/2017. FINDINGS: The cardiac silhouette remains borderline enlarged. Stable left pleural effusion and left basilar airspace opacity. Small right pleural effusion, decreased, with decreased adjacent right basilar atelectasis. Thoracic spine degenerative changes. IMPRESSION: 1. Stable left pleural effusion and left lower lobe atelectasis or pneumonia. 2. Decreased right pleural fluid and right basilar atelectasis. Electronically Signed   By: Claudie Revering M.D.   On: 07/22/2017 09:14   Dg Chest Port 1 View  Result Date: 07/19/2017 CLINICAL DATA:  Respiratory failure EXAM: PORTABLE CHEST 1 VIEW COMPARISON:  July 15, 2017 FINDINGS: There are small pleural effusions bilaterally. There is consolidation in the left lower lobe. Lungs elsewhere are clear. Heart size is upper normal with pulmonary vascularity within normal limits. No adenopathy. There is degenerative change in the thoracic spine. IMPRESSION: Left lower lobe consolidation with small pleural effusions bilaterally. There is mild bibasilar atelectasis. Stable cardiac silhouette. Electronically Signed   By: Lowella Grip III M.D.   On: 07/19/2017 07:57   Dg Chest Port 1 View  Result Date: 07/15/2017 CLINICAL DATA:  Acute respiratory failure. EXAM: PORTABLE CHEST 1 VIEW COMPARISON:  Chest x-ray from yesterday FINDINGS: The heart size and mediastinal contours are within normal limits. Normal pulmonary vascularity. Unchanged left greater than right bibasilar atelectasis  and scarring at the costophrenic angles. No focal consolidation, pleural effusion, or pneumothorax. No acute osseous abnormality. IMPRESSION: Stable bibasilar atelectasis/scarring. Electronically Signed   By: Titus Dubin M.D.   On: 07/15/2017 10:41   Dg Chest Portable 1 View  Result Date: 07/14/2017 CLINICAL DATA:  Shortness of breath. EXAM: PORTABLE CHEST 1 VIEW COMPARISON:  06/05/2017. FINDINGS: Mediastinum and hilar structures normal. Stable mild cardiomegaly with normal pulmonary vascularity. Low lung volumes with mild basilar atelectasis. Stable bilateral pleural thickening consistent scarring. Degenerative changes thoracic spine. IMPRESSION: Low lung volumes with mild basilar atelectasis and/or pleuroparenchymal scarring again noted. Stable mild cardiomegaly. Chest is stable from prior exam. Electronically Signed   By: Marcello Moores  Register   On: 07/14/2017 17:13   Ct Renal Stone Study  Result Date: 07/04/2017 CLINICAL DATA:  62 year old female with blood on pad. Question rectal bleeding, vaginal bleeding or blood in urine. Initial encounter. EXAM: CT ABDOMEN AND PELVIS WITHOUT CONTRAST  TECHNIQUE: Multidetector CT imaging of the abdomen and pelvis was performed following the standard protocol without IV contrast. COMPARISON:  06/05/2017 CT. FINDINGS: Lower chest: Bibasilar atelectasis greater left once again noted. Hepatobiliary: Multiple gallstones. No CT evidence of surrounding inflammation. If patient had right upper quadrant tenderness, ultrasound can be obtained for further delineation. Taking into account limitation by non contrast imaging, no worrisome liver lesion. Pancreas: Taking into account limitation by non contrast imaging, no worrisome pancreatic lesion or inflammation. Spleen: Taking into account limitation by non contrast imaging, no worrisome splenic lesion or enlargement. Adrenals/Urinary Tract: When compared to the recent CT, there is mild fullness of the right renal collecting system without obstructing stone identified. This may be related to recently passed stone. Bilateral tiny/small nonobstructing renal calculi are noted (largest left lower pole measures 3.5 mm). Taking into account limitation by non contrast imaging, no worrisome renal or adrenal lesion. Noncontrast filled views of the urinary bladder unremarkable. Stomach/Bowel: Moderate stool rectosigmoid region. Superior extension sigmoid colon. No extraluminal bowel inflammatory process noted. Under distended stomach without gross abnormality. Tiny lipoma third portion of the duodenum. Vascular/Lymphatic: Aortic and iliac artery calcification. No aneurysm. No adenopathy. Reproductive: Retroverted uterus.  No adnexal mass. Other: No free intraperitoneal air or bowel containing hernia. Musculoskeletal: No worrisome osseous abnormality. IMPRESSION: When compared to recent CT, there is mild fullness of the right renal collecting system without obstructing stone identified. This may be related to recently passed stone or blood clot. Bilateral tiny/small nonobstructing renal calculi. Multiple gallstones. Bibasilar  atelectasis greater left once again noted. Moderate stool rectosigmoid region. Superior extension sigmoid colon. No extraluminal bowel inflammatory process noted. Tiny lipoma third portion of the duodenum. Aortic Atherosclerosis (ICD10-I70.0). Electronically Signed   By: Genia Del M.D.   On: 07/04/2017 08:03   [place] Ekg Site Rite  Result Date: 07/25/2017 If Site Rite image not attached, placement could not be confirmed due to current cardiac rhythm.    CBC Recent Labs  Lab 07/23/17 0541 07/25/17 0743 07/26/17 0502  WBC 14.5* 11.5* 9.3  HGB 9.0* 9.8* 8.7*  HCT 28.9* 31.2* 26.0*  PLT 234 373 361  MCV 91.8 92.9 91.3  MCH 28.5 29.1 30.4  MCHC 31.0* 31.3* 33.3  RDW 16.8* 17.3* 17.4*  LYMPHSABS  --  0.5* 0.3*  MONOABS  --  0.8 0.9  EOSABS  --  0.3 0.3  BASOSABS  --  0.0 0.0    Chemistries  Recent Labs  Lab 07/23/17 0541 07/24/17 0449 07/25/17 0743 07/25/17 2130 07/26/17 0502 07/27/17 0401 07/28/17 0932  NA 140 144 139  --  141 138 135  K 4.0 4.9 2.8* 4.3 4.4 3.9 3.9  CL 89* 93* 89*  --  95* 92* 92*  CO2 41* 43* 40*  --  39* 38* 33*  GLUCOSE 116* 120* 119*  --  127* 112* 138*  BUN 12 9 12   --  9 12 14   CREATININE <0.30* <0.30* <0.30*  --  <0.30* <0.30* <0.30*  CALCIUM 8.1* 8.4* 8.4*  --  8.5* 8.8* 8.8*  MG 1.5* 2.2 1.9  --  1.7  --  1.6*   ------------------------------------------------------------------------------------------------------------------ CrCl cannot be calculated (This lab value cannot be used to calculate CrCl because it is not a number: <0.30). ------------------------------------------------------------------------------------------------------------------ No results for input(s): HGBA1C in the last 72 hours. ------------------------------------------------------------------------------------------------------------------ No results for input(s): CHOL, HDL, LDLCALC, TRIG, CHOLHDL, LDLDIRECT in the last 72  hours. ------------------------------------------------------------------------------------------------------------------ No results for input(s): TSH, T4TOTAL, T3FREE, THYROIDAB in the last 72 hours.  Invalid input(s): FREET3 ------------------------------------------------------------------------------------------------------------------ No results for input(s): VITAMINB12, FOLATE, FERRITIN, TIBC, IRON, RETICCTPCT in the last 72 hours.  Coagulation profile Recent Labs  Lab 07/25/17 0743  INR 0.93    No results for input(s): DDIMER in the last 72 hours.  Cardiac Enzymes No results for input(s): CKMB, TROPONINI, MYOGLOBIN in the last 168 hours.  Invalid input(s): CK ------------------------------------------------------------------------------------------------------------------ Invalid input(s): POCBNP    Assessment & Plan   62 year old female patient with history of chronic respiratory failure, GI bleed currently in the hospital for acute on chronic hypercapnic respiratory failure and anemia secondary to slow GI bleed.  1.Acute on chronic hypercapnic respiratory failure S/p tracheostomy on ventilator support Impaired ventilatory drive and global muscle weakness Appreciate intensivist management.  2. Moderate malnutrition  Feeding supplements  3.  Moderate malnutrition  PEG tube placement for feeding Supplements  4. Electrolyte imbalance F/u magnesium and potassium  5.  Acute on chronic gastrointestinal bleeding s/p EGD 3/18 showing Normal duodenal bulb and second portion of the duodenum.Duodenal lipoma. Non-bleeding gastric ulcer with a clean ulcer base (Forrest Class III).Normal gastroesophageal junction and esophagus. - protonix 40 mg IV bid -Given rectal bleed discussed with Dr. Tahiliani--recommending monitoring hemoglobin and transfusion as needed and PPI once patient is stable clinically might consider colonoscopy if continues to have GI bleed  6. DVT  prophylaxis with SCD  7. Hypomagnesemia : Replace magnesium  8.  Management follow-up for possible LTAC placement          Code Status Orders  (From admission, onward)        Start     Ordered   07/14/17 2115  Full code  Continuous     07/14/17 2122    Code Status History    Date Active Date Inactive Code Status Order ID Comments User Context   06/05/2017 1654 06/08/2017 1820 Full Code [id number]  Bettey Costa, MD Inpatient   11/01/2016 0121 11/09/2016 2044 Full Code [id number]  Lance Coon, MD Inpatient       Time Spent in minutes   35 minutes  Greater than 50% of time spent in care coordination and counseling patient regarding the condition and plan of care.   Saundra Shelling M.D on 07/28/2017 at 1:52 PM  Between 7am to 6pm - Pager - [phone]  After 6pm go to [email or web address] - Proofreader  [employer]   Office  [phone]

## 2017-07-28 NOTE — Progress Notes (Signed)
OT Cancellation Note  Patient Details Name: Kayla Boyer MRN: [id number] DOB: 08-May-1955   Cancelled Treatment:    Reason Eval/Treat Not Completed: Patient declined, no reason specified Chart reviewed and pt underwent tracheostomy yesterday and declined PT earlier today due to fatigue.  Spoke to pt's nurse who indicated that patient has declined all therapies today and does not want to participate again until Monday.  Will re-assess tomorrow to see if status is different.    Chrys Racer, OTR/L ascom [phone] 07/28/17, 4:43 PM

## 2017-07-28 NOTE — Progress Notes (Addendum)
Pharmacy Electrolyte Monitoring Consult:  Pharmacy consulted to assist in monitoring and replacing electrolytes in this 62 y.o. female admitted on 07/14/2017 with Weakness   Labs:  Sodium (mmol/L)  Date Value  07/28/2017 135   Potassium (mmol/L)  Date Value  07/28/2017 3.9   Magnesium (mg/dL)  Date Value  07/28/2017 1.6 (L)   Phosphorus (mg/dL)  Date Value  07/26/2017 3.4   Calcium (mg/dL)  Date Value  07/28/2017 8.8 (L)   Albumin (g/dL)  Date Value  07/26/2017 2.5 (L)    Plan: Mg 1.6; Mg 2g IV once ordered  Follow up electrolytes with AM labs tomorrow.  Pharmacy to continue to monitor and replace per protocol.  Candelaria Stagers, PharmD Pharmacy Resident  07/28/2017 6:15 AM

## 2017-07-28 NOTE — Progress Notes (Signed)
Pt's HR increased to the 150's after bath. Patient looked visibly uncomfortable. 2mg  of morphine given. After about 5 minutes, patients HR remained in the high 150's. 2.5 mg of metoprolol given. RT notified and patient was placed back on full vent support. HR currently in the 120's. BP of 119/82. Patient appears to be resting comfortably.

## 2017-07-29 ENCOUNTER — Inpatient Hospital Stay: Payer: Medicaid Other

## 2017-07-29 LAB — BASIC METABOLIC PANEL
ANION GAP: 9 (ref 5–15)
BUN: 15 mg/dL (ref 6–20)
CHLORIDE: 94 mmol/L — AB (ref 101–111)
CO2: 32 mmol/L (ref 22–32)
Calcium: 8.4 mg/dL — ABNORMAL LOW (ref 8.9–10.3)
Glucose, Bld: 120 mg/dL — ABNORMAL HIGH (ref 65–99)
POTASSIUM: 3.6 mmol/L (ref 3.5–5.1)
SODIUM: 135 mmol/L (ref 135–145)

## 2017-07-29 LAB — MAGNESIUM: MAGNESIUM: 1.8 mg/dL (ref 1.7–2.4)

## 2017-07-29 LAB — GLUCOSE, CAPILLARY
GLUCOSE-CAPILLARY: 124 mg/dL — AB (ref 65–99)
GLUCOSE-CAPILLARY: 133 mg/dL — AB (ref 65–99)
GLUCOSE-CAPILLARY: 104 mg/dL — AB (ref 65–99)
Glucose-Capillary: 138 mg/dL — ABNORMAL HIGH (ref 65–99)

## 2017-07-29 MED ORDER — ONDANSETRON HCL 4 MG/2ML IJ SOLN: 4.0000 mg | Freq: Once | INTRAMUSCULAR | Status: DC | PRN

## 2017-07-29 MED ORDER — DEXTROSE 5 % IV SOLN: 60.0000 mmol | Freq: Once | INTRAVENOUS | Status: DC

## 2017-07-29 MED ORDER — FENTANYL CITRATE (PF) 100 MCG/2ML IJ SOLN
25.0000 ug | INTRAMUSCULAR | Status: DC | PRN
  Administered 2017-07-31 – 2017-08-01 (×3): 25 ug via INTRAVENOUS
  Filled 2017-07-29 (×3): qty 2

## 2017-07-29 MED ORDER — POTASSIUM CHLORIDE 20 MEQ PO PACK
20.0000 meq | PACK | Freq: Once | ORAL | Status: AC
  Administered 2017-07-29: 20 meq
  Filled 2017-07-29: qty 1

## 2017-07-29 NOTE — Progress Notes (Signed)
Pharmacy Electrolyte Monitoring Consult:  Pharmacy consulted to assist in monitoring and replacing electrolytes in this 62 y.o. female admitted on 07/14/2017 with Weakness   Labs:  Sodium (mmol/L)  Date Value  07/29/2017 135   Potassium (mmol/L)  Date Value  07/29/2017 3.6   Magnesium (mg/dL)  Date Value  07/29/2017 1.8   Phosphorus (mg/dL)  Date Value  07/26/2017 3.4   Calcium (mg/dL)  Date Value  07/29/2017 8.4 (L)   Albumin (g/dL)  Date Value  07/26/2017 2.5 (L)    Plan: Pt is s/p tracheostomy placement. She is on tube feeds. K=3.6, mg=1.8 this AM. I will give 20 MEQ KCL per tube this AM. Check K, Mg, phos in the AM  Pharmacy to continue to monitor and replace per protocol.  Ramond Dial, Pharm.D, BCPS Clinical Pharmacist  07/29/2017 9:01 AM

## 2017-07-29 NOTE — Progress Notes (Signed)
[place] [facility]                                                                                                                                                                                  Patient Demographics   Kayla Boyer, is a 62 y.o. female, DOB - 16-Oct-1955, HFW:[id number]  Admit date - 07/14/2017   Admitting Physician Saundra Shelling, MD  Outpatient Primary MD for the patient is Patient, [name]   LOS - 22  Subjectiveshe is s/p tracheostomy with ventilator support PEEP 5 PS 15 FIO2 24 % Awake and responds to verbal commands Has generalized weakness, husband is at bedside.,    Review of Systems:  On ventilator support via tracheostomy Opens eyes and communicate some verbal commands Complete review of systems not possible  Vitals:   Vitals:   07/29/17 0800 07/29/17 0900 07/29/17 1000 07/29/17 1100  BP: (!) 87/50 94/64 105/68 (!) 98/58  Pulse: (!) 103 (!) 105 (!) 102 (!) 102  Resp: (!) 21 20 (!) 23 (!) 23  Temp: 98.8 F (37.1 C)     TempSrc: Oral     SpO2: 97% 99% 96% 98%  Weight:      Height:        Wt Readings from Last 3 Encounters:  07/29/17 59.4 kg (130 lb 15.3 oz)  07/05/17 60.8 kg (134 lb)  07/04/17 60.8 kg (134 lb)     Intake/Output Summary (Last 24 hours) at 07/29/2017 1249 Last data filed at 07/29/2017 1000 Gross per 24 hour  Intake 2070.17 ml  Output 1800 ml  Net 270.17 ml    Physical Exam:   GENERAL: 62 year old female patient lying on the bed in the stepdown unit on BiPAP HEAD, EYES, EARS, NOSE AND THROAT: Atraumatic, normocephalic. Extraocular muscles are intact. Pupils equal and reactive to light. Sclerae anicteric. No conjunctival injection. No oro-pharyngeal erythema.  NECK: Supple. There is no jugular venous distention. No bruits, no lymphadenopathy, no thyromegaly.  Patient has trach in the neck.   HEART: Regular rate and rhythm,. No murmurs, no rubs, no clicks.  LUNGS: Clear to auscultation  bilaterally. No rales or rhonchi. No wheezes.  On ventilator support via tracheostomy ABDOMEN: Soft, flat, nontender, nondistended. Has good bowel sounds. No hepatosplenomegaly appreciated.  EXTREMITIES: No evidence of any cyanosis, clubbing.  +2 pedal and radial pulses bilaterally.  1+ pedal edema NEUROLOGIC: The patient is alert, arousable, and oriented x2 with   Decreased hearing, left arm weakness improved Right pupil greater than left pupil SKIN: Moist and warm with no rashes appreciated.  Psych: Not be assessed LN: No inguinal LN enlargement    Antibiotics  Anti-infectives (From admission, onward)   Start     Dose/Rate Route Frequency Ordered Stop   07/14/17 2200  azithromycin (ZITHROMAX) 500 mg in sodium chloride 0.9 % 250 mL IVPB  Status:  Discontinued     500 mg 250 mL/hr over 60 Minutes Intravenous Every 24 hours 07/14/17 2122 07/18/17 0915      Medications   Scheduled Meds: . chlorhexidine  15 mL Mouth Rinse BID  . sennosides  5 mL Per Tube BID   And  . docusate  50 mg Per Tube BID  . free water  200 mL Per Tube Q6H  . insulin aspart  0-9 Units Subcutaneous Q6H  . ipratropium-albuterol  3 mL Nebulization Q4H  . mouth rinse  15 mL Mouth Rinse q12n4p  . multivitamin  15 mL Per Tube Daily  . pantoprazole sodium  40 mg Per Tube BID  . sodium chloride flush  10-40 mL Intracatheter Q12H  . thiamine  100 mg Per Tube Daily  . thyroid  30 mg Per Tube QAC breakfast   Continuous Infusions: . dextrose 25 mL/hr at 07/29/17 0600  . feeding supplement (VITAL HIGH PROTEIN) 50 mL/hr at 07/29/17 0600   PRN Meds:.acetaminophen (TYLENOL) oral liquid 160 mg/5 mL, acetaminophen **OR** acetaminophen, ALPRAZolam, bisacodyl, metoprolol tartrate, morphine injection, [DISCONTINUED] ondansetron **OR** ondansetron (ZOFRAN) IV, sodium chloride flush   Data Review:   Micro Results No results found for this or any previous visit (from the past 240 hour(s)).  Radiology Reports Dg Abd  1 View  Result Date: 07/22/2017 CLINICAL DATA:  Encounter for feeding tube placement. EXAM: ABDOMEN - 1 VIEW COMPARISON:  Chest radiograph 07/22/2017 FINDINGS: A nasogastric tube has been advanced into the abdomen. Catheter tip is in the distal stomach region. Bowel gas throughout the abdomen. Persistent densities at the left lung base and the left hemidiaphragm remains obscured. IMPRESSION: Feeding tube tip in the distal stomach region. Electronically Signed   By: Markus Daft M.D.   On: 07/22/2017 16:30   Ct Head Wo Contrast  Result Date: 07/23/2017 CLINICAL DATA:  61 y/o F; changes to left pupil in strength of left arm. EXAM: CT HEAD WITHOUT CONTRAST TECHNIQUE: Contiguous axial images were obtained from the base of the skull through the vertex without intravenous contrast. COMPARISON:  None. FINDINGS: Brain: No evidence of acute infarction, hemorrhage, hydrocephalus, extra-axial collection or mass lesion/mass effect. Vascular: Calcific atherosclerosis of carotid siphons. No hyperdense vessel. Skull: Normal. Negative for fracture or focal lesion. Sinuses/Orbits: No acute finding. Other: None. IMPRESSION: Negative CT of the head. Electronically Signed   By: Kristine Garbe M.D.   On: 07/23/2017 14:33   Nm Gi Blood Loss  Result Date: 07/20/2017 CLINICAL DATA:  GI bleeding EXAM: NUCLEAR MEDICINE GASTROINTESTINAL BLEEDING SCAN TECHNIQUE: Sequential abdominal images were obtained following intravenous administration of Tc-43m labeled red blood cells. RADIOPHARMACEUTICALS:  Twenty mCi Tc-82m pertechnetate in-vitro labeled red cells. COMPARISON:  None. FINDINGS: Normal distribution of radiotracer is noted. No focal area of tracer accumulation is identified to suggest active GI bleeding is noted. IMPRESSION: No findings to suggest active GI hemorrhage. Electronically Signed   By: Inez Catalina M.D.   On: 07/20/2017 15:16   US Venous Img Upper Bilat  Result Date: 07/16/2017 CLINICAL DATA:  Bilateral  upper extremity edema acutely with pain and discomfort for 1 day EXAM: BILATERAL UPPER EXTREMITY VENOUS DOPPLER ULTRASOUND TECHNIQUE: Gray-scale sonography with graded compression, as well as color Doppler and duplex ultrasound were performed to evaluate the bilateral upper extremity  deep venous systems from the level of the subclavian vein and including the jugular, axillary, basilic, radial, ulnar and upper cephalic vein. Spectral Doppler was utilized to evaluate flow at rest and with distal augmentation maneuvers. COMPARISON:  None. FINDINGS: Exam is limited because of upper extremity subcutaneous edema. RIGHT UPPER EXTREMITY Internal Jugular Vein: No evidence of thrombus. Normal compressibility, respiratory phasicity and response to augmentation. Subclavian Vein: No evidence of thrombus. Normal compressibility, respiratory phasicity and response to augmentation. Axillary Vein: No evidence of thrombus. Normal compressibility, respiratory phasicity and response to augmentation. Cephalic Vein: No evidence of thrombus. Normal compressibility, respiratory phasicity and response to augmentation. Basilic Vein: Not well visualized to accurately assess. Brachial Veins: No evidence of thrombus. Normal compressibility, respiratory phasicity and response to augmentation. Radial Veins: No evidence of thrombus. Normal compressibility, respiratory phasicity and response to augmentation. Ulnar Veins: No evidence of thrombus. Normal compressibility, respiratory phasicity and response to augmentation. Venous Reflux:  None. Other Findings:  Peripheral edema noted LEFT UPPER EXTREMITY Internal Jugular Vein: No evidence of thrombus. Normal compressibility, respiratory phasicity and response to augmentation. Subclavian Vein: No evidence of thrombus. Normal compressibility, respiratory phasicity and response to augmentation. Axillary Vein: No evidence of thrombus. Normal compressibility, respiratory phasicity and response to  augmentation. Cephalic Vein: No evidence of thrombus. Normal compressibility, respiratory phasicity and response to augmentation. Basilic Vein: No evidence of thrombus. Normal compressibility, respiratory phasicity and response to augmentation. Brachial Veins: No evidence of thrombus. Normal compressibility, respiratory phasicity and response to augmentation. Radial Veins: No evidence of thrombus. Normal compressibility, respiratory phasicity and response to augmentation. Ulnar Veins: No evidence of thrombus. Normal compressibility, respiratory phasicity and response to augmentation. Venous Reflux:  None. Other Findings:  But peripheral subcutaneous edema noted. IMPRESSION: Limited exam but no gross occlusive upper extremity DVT in either arm. Electronically Signed   By: Jerilynn Mages.  Shick M.D.   On: 07/16/2017 12:34   US Venous Img Upper Uni Left  Result Date: 07/25/2017 CLINICAL DATA:  Left upper extremity edema. EXAM: LEFT UPPER EXTREMITY VENOUS DOPPLER ULTRASOUND TECHNIQUE: Gray-scale sonography with graded compression, as well as color Doppler and duplex ultrasound were performed to evaluate the upper extremity deep venous system from the level of the subclavian vein and including the jugular, axillary, basilic, radial, ulnar and upper cephalic vein. Spectral Doppler was utilized to evaluate flow at rest and with distal augmentation maneuvers. COMPARISON:  None. FINDINGS: Contralateral Subclavian Vein: Respiratory phasicity is normal and symmetric with the symptomatic side. No evidence of thrombus. Normal compressibility. Internal Jugular Vein: No evidence of thrombus. Normal compressibility, respiratory phasicity and response to augmentation. Subclavian Vein: No evidence of thrombus. Normal compressibility, respiratory phasicity and response to augmentation. Axillary Vein: No evidence of thrombus. Normal compressibility, respiratory phasicity and response to augmentation. Cephalic Vein: No evidence of thrombus.  Normal compressibility, respiratory phasicity and response to augmentation. Basilic Vein: No evidence of thrombus. Normal compressibility, respiratory phasicity and response to augmentation. Brachial Veins: No evidence of thrombus. Normal compressibility, respiratory phasicity and response to augmentation. Radial Veins: No evidence of thrombus. Normal compressibility, respiratory phasicity and response to augmentation. Ulnar Veins: No evidence of thrombus. Normal compressibility, respiratory phasicity and response to augmentation. Venous Reflux:  None visualized. Other Findings: There is significant edema noted in the left upper arm including some more locally marginated fluid in the subcutaneous tissues of the left medial upper arm. This fluid appears to extend over a long distance and likely represents subcutaneous fluid which is more prominent in 1 area of  the upper arm. If there is any concern for soft tissue infection, additional imaging may be warranted. IMPRESSION: 1. No evidence of left upper extremity deep venous thrombosis. 2. Significant subcutaneous fluid in the left upper arm with an area of more marginated subcutaneous fluid also identified in the medial left upper arm. Although this does not have the appearance of a focal abscess by ultrasound, if there is concern for upper extremity infection, additional imaging may be warranted with CT and/or MRI. Electronically Signed   By: Aletta Edouard M.D.   On: 07/25/2017 17:48   Dg Chest Port 1 View  Result Date: 07/27/2017 CLINICAL DATA:  Excessive fluid EXAM: PORTABLE CHEST 1 VIEW COMPARISON:  Chest x-ray of July 23, 2017 FINDINGS: The lungs are well-expanded. There is a trace of pleural fluid on the right and slightly more on the left. The retrocardiac region remains dense. The cardiac silhouette is enlarged. The central pulmonary vascularity is prominent. The mediastinum is normal in width. The trachea is midline. The right-sided PICC line tip  projects over the midportion of the SVC. The tracheostomy tube tip projects at the superior margin of the clavicular heads. The bony thorax exhibits no acute abnormality. IMPRESSION: Slight improved aeration of both lungs today. Small amounts of pleural fluid at both bases. Persistent atelectasis or pneumonia in the left lower lobe. Top-normal cardiac size with central pulmonary vascular congestion but no definite pulmonary edema. Electronically Signed   By: David  Martinique M.D.   On: 07/27/2017 10:55   Dg Chest Port 1 View  Result Date: 07/23/2017 CLINICAL DATA:  Acute on chronic respiratory failure. Airway aspiration. EXAM: PORTABLE CHEST 1 VIEW COMPARISON:  07/22/2017. FINDINGS: Interval enlargement of the cardiac silhouette. Stable dense left lower lobe airspace opacity. Interval increased linear and patchy density at the right lung base. Stable small bilateral pleural effusions. Nasogastric tube extending into the stomach. Thoracic spine degenerative changes. IMPRESSION: 1. No significant change in dense left lower lobe atelectasis or pneumonia and small left pleural effusion. 2. Increased right basilar atelectasis or pneumonia with a stable small right pleural effusion. Electronically Signed   By: Claudie Revering M.D.   On: 07/23/2017 16:09   Dg Chest Port 1 View  Result Date: 07/22/2017 CLINICAL DATA:  Respiratory failure.  COPD. EXAM: PORTABLE CHEST 1 VIEW COMPARISON:  07/19/2017. FINDINGS: The cardiac silhouette remains borderline enlarged. Stable left pleural effusion and left basilar airspace opacity. Small right pleural effusion, decreased, with decreased adjacent right basilar atelectasis. Thoracic spine degenerative changes. IMPRESSION: 1. Stable left pleural effusion and left lower lobe atelectasis or pneumonia. 2. Decreased right pleural fluid and right basilar atelectasis. Electronically Signed   By: Claudie Revering M.D.   On: 07/22/2017 09:14   Dg Chest Port 1 View  Result Date:  07/19/2017 CLINICAL DATA:  Respiratory failure EXAM: PORTABLE CHEST 1 VIEW COMPARISON:  July 15, 2017 FINDINGS: There are small pleural effusions bilaterally. There is consolidation in the left lower lobe. Lungs elsewhere are clear. Heart size is upper normal with pulmonary vascularity within normal limits. No adenopathy. There is degenerative change in the thoracic spine. IMPRESSION: Left lower lobe consolidation with small pleural effusions bilaterally. There is mild bibasilar atelectasis. Stable cardiac silhouette. Electronically Signed   By: Lowella Grip III M.D.   On: 07/19/2017 07:57   Dg Chest Port 1 View  Result Date: 07/15/2017 CLINICAL DATA:  Acute respiratory failure. EXAM: PORTABLE CHEST 1 VIEW COMPARISON:  Chest x-ray from yesterday FINDINGS: The heart size and mediastinal  contours are within normal limits. Normal pulmonary vascularity. Unchanged left greater than right bibasilar atelectasis and scarring at the costophrenic angles. No focal consolidation, pleural effusion, or pneumothorax. No acute osseous abnormality. IMPRESSION: Stable bibasilar atelectasis/scarring. Electronically Signed   By: Titus Dubin M.D.   On: 07/15/2017 10:41   Dg Chest Portable 1 View  Result Date: 07/14/2017 CLINICAL DATA:  Shortness of breath. EXAM: PORTABLE CHEST 1 VIEW COMPARISON:  06/05/2017. FINDINGS: Mediastinum and hilar structures normal. Stable mild cardiomegaly with normal pulmonary vascularity. Low lung volumes with mild basilar atelectasis. Stable bilateral pleural thickening consistent scarring. Degenerative changes thoracic spine. IMPRESSION: Low lung volumes with mild basilar atelectasis and/or pleuroparenchymal scarring again noted. Stable mild cardiomegaly. Chest is stable from prior exam. Electronically Signed   By: Marcello Moores  Register   On: 07/14/2017 17:13   Ct Renal Stone Study  Result Date: 07/04/2017 CLINICAL DATA:  62 year old female with blood on pad. Question rectal bleeding,  vaginal bleeding or blood in urine. Initial encounter. EXAM: CT ABDOMEN AND PELVIS WITHOUT CONTRAST TECHNIQUE: Multidetector CT imaging of the abdomen and pelvis was performed following the standard protocol without IV contrast. COMPARISON:  06/05/2017 CT. FINDINGS: Lower chest: Bibasilar atelectasis greater left once again noted. Hepatobiliary: Multiple gallstones. No CT evidence of surrounding inflammation. If patient had right upper quadrant tenderness, ultrasound can be obtained for further delineation. Taking into account limitation by non contrast imaging, no worrisome liver lesion. Pancreas: Taking into account limitation by non contrast imaging, no worrisome pancreatic lesion or inflammation. Spleen: Taking into account limitation by non contrast imaging, no worrisome splenic lesion or enlargement. Adrenals/Urinary Tract: When compared to the recent CT, there is mild fullness of the right renal collecting system without obstructing stone identified. This may be related to recently passed stone. Bilateral tiny/small nonobstructing renal calculi are noted (largest left lower pole measures 3.5 mm). Taking into account limitation by non contrast imaging, no worrisome renal or adrenal lesion. Noncontrast filled views of the urinary bladder unremarkable. Stomach/Bowel: Moderate stool rectosigmoid region. Superior extension sigmoid colon. No extraluminal bowel inflammatory process noted. Under distended stomach without gross abnormality. Tiny lipoma third portion of the duodenum. Vascular/Lymphatic: Aortic and iliac artery calcification. No aneurysm. No adenopathy. Reproductive: Retroverted uterus.  No adnexal mass. Other: No free intraperitoneal air or bowel containing hernia. Musculoskeletal: No worrisome osseous abnormality. IMPRESSION: When compared to recent CT, there is mild fullness of the right renal collecting system without obstructing stone identified. This may be related to recently passed stone or  blood clot. Bilateral tiny/small nonobstructing renal calculi. Multiple gallstones. Bibasilar atelectasis greater left once again noted. Moderate stool rectosigmoid region. Superior extension sigmoid colon. No extraluminal bowel inflammatory process noted. Tiny lipoma third portion of the duodenum. Aortic Atherosclerosis (ICD10-I70.0). Electronically Signed   By: Genia Del M.D.   On: 07/04/2017 08:03   [place] Ekg Site Rite  Result Date: 07/25/2017 If Site Rite image not attached, placement could not be confirmed due to current cardiac rhythm.    CBC Recent Labs  Lab 07/23/17 0541 07/25/17 0743 07/26/17 0502  WBC 14.5* 11.5* 9.3  HGB 9.0* 9.8* 8.7*  HCT 28.9* 31.2* 26.0*  PLT 234 373 361  MCV 91.8 92.9 91.3  MCH 28.5 29.1 30.4  MCHC 31.0* 31.3* 33.3  RDW 16.8* 17.3* 17.4*  LYMPHSABS  --  0.5* 0.3*  MONOABS  --  0.8 0.9  EOSABS  --  0.3 0.3  BASOSABS  --  0.0 0.0    Chemistries  Recent Labs  Lab 07/24/17 0449 07/25/17 0743 07/25/17 2130 07/26/17 0502 07/27/17 0401 07/28/17 0519 07/29/17 0808  NA 144 139  --  141 138 135 135  K 4.9 2.8* 4.3 4.4 3.9 3.9 3.6  CL 93* 89*  --  95* 92* 92* 94*  CO2 43* 40*  --  39* 38* 33* 32  GLUCOSE 120* 119*  --  127* 112* 138* 120*  BUN 9 12  --  9 12 14 15   CREATININE <0.30* <0.30*  --  <0.30* <0.30* <0.30* <0.30*  CALCIUM 8.4* 8.4*  --  8.5* 8.8* 8.8* 8.4*  MG 2.2 1.9  --  1.7  --  1.6* 1.8   ------------------------------------------------------------------------------------------------------------------ CrCl cannot be calculated (This lab value cannot be used to calculate CrCl because it is not a number: <0.30). ------------------------------------------------------------------------------------------------------------------ No results for input(s): HGBA1C in the last 72 hours. ------------------------------------------------------------------------------------------------------------------ No results for input(s): CHOL, HDL,  LDLCALC, TRIG, CHOLHDL, LDLDIRECT in the last 72 hours. ------------------------------------------------------------------------------------------------------------------ No results for input(s): TSH, T4TOTAL, T3FREE, THYROIDAB in the last 72 hours.  Invalid input(s): FREET3 ------------------------------------------------------------------------------------------------------------------ No results for input(s): VITAMINB12, FOLATE, FERRITIN, TIBC, IRON, RETICCTPCT in the last 72 hours.  Coagulation profile Recent Labs  Lab 07/25/17 0743  INR 0.93    No results for input(s): DDIMER in the last 72 hours.  Cardiac Enzymes No results for input(s): CKMB, TROPONINI, MYOGLOBIN in the last 168 hours.  Invalid input(s): CK ------------------------------------------------------------------------------------------------------------------ Invalid input(s): POCBNP    Assessment & Plan   62 year old female patient with history of chronic respiratory failure, GI bleed currently in the hospital for acute on chronic hypercapnic respiratory failure and anemia secondary to slow GI bleed.  1.Acute on chronic hypercapnic respiratory failure S/p tracheostomy on ventilator support Impaired ventilatory drive and global muscle weakness Appreciate intensivist management.  2. Moderate malnutrition  Feeding supplements  3.  Moderate malnutrition  Continue NG tube feeding, appreciate GI following. 4. Electrolyte imbalance F/u magnesium and potassium  5.  Acute on chronic gastrointestinal bleeding s/p EGD 3/18 showing Normal duodenal bulb and second portion of the duodenum.Duodenal lipoma. Non-bleeding gastric ulcer with a clean ulcer base (Forrest Class III).Normal gastroesophageal junction and esophagus. - protonix 40 mg IV bid - 6. DVT prophylaxis with SCD  7. Hypomagnesemia : Replace magnesium  8.  Management follow-up for possible LTAC placement          Code Status Orders   (From admission, onward)        Start     Ordered   07/14/17 2115  Full code  Continuous     07/14/17 2122    Code Status History    Date Active Date Inactive Code Status Order ID Comments User Context   06/05/2017 1654 06/08/2017 1820 Full Code [id number]  Bettey Costa, MD Inpatient   11/01/2016 0121 11/09/2016 2044 Full Code [id number]  Lance Coon, MD Inpatient       Time Spent in minutes   35 minutes  Greater than 50% of time spent in care coordination and counseling patient regarding the condition and plan of care.   Epifanio Lesches M.D on 07/29/2017 at 12:49 PM  Between 7am to 6pm - Pager - [phone]  After 6pm go to [email or web address] - Proofreader  [employer]   Office  [phone]

## 2017-07-29 NOTE — Progress Notes (Signed)
PT Cancellation Note  Patient Details Name: Dellamae Rosamilia MRN: [id number] DOB: 1955/11/10   Cancelled Treatment:    Reason Eval/Treat Not Completed: Patient declined, no reason specified. Chart reviewed and pt very adamant about holding therapy until Monday. Discussed with RN. Will hold this date and re-attempt as able if pt participates.   Hadas Jessop 07/29/2017, 10:58 AM  Greggory Stallion, PT, DPT [phone]

## 2017-07-29 NOTE — Progress Notes (Signed)
   Name: Kayla Boyer MRN: [id number] DOB: [birth year]     CONSULTATION DATE: 07/14/2017  Subjective: No major issues last night  Objectives: Tolerating CPAP/PS   HISTORY OF PRESENT ILLNESS:    PAST MEDICAL HISTORY :   has a past medical history of Emphysema of lung ([place]), GI bleed, and HLD (hyperlipidemia).  has a past surgical history that includes No past surgeries; Oophorectomy (Left); Esophagogastroduodenoscopy (egd) with propofol (N/A, 07/17/2017); and Tracheostomy tube placement (N/A, 07/27/2017). Prior to Admission medications   Medication Sig Start Date End Date Taking? Authorizing Provider  feeding supplement, ENSURE ENLIVE, (ENSURE ENLIVE) LIQD Take 237 mLs by mouth 2 (two) times daily between meals. 06/08/17   Vaughan Basta, MD  ferrous sulfate 325 (65 FE) MG tablet Take 1 tablet (325 mg total) by mouth 2 (two) times daily with a meal. 06/08/17   Vaughan Basta, MD  pantoprazole (PROTONIX) 40 MG tablet Take 1 tablet (40 mg total) by mouth 2 (two) times daily before a meal. 06/08/17   Vaughan Basta, MD  vitamin B-12 (CYANOCOBALAMIN) 1000 MCG tablet Take 1,000 mcg by mouth daily.    [provider]  vitamin C (ASCORBIC ACID) 500 MG tablet Take 500 mg by mouth daily.    [provider]   No Known Allergies  FAMILY HISTORY:  Family history is unknown by patient. SOCIAL HISTORY:  reports that she has never smoked. She has never used smokeless tobacco. She reports that she does not drink alcohol or use drugs.  REVIEW OF SYSTEMS:   Unable to obtain due to critical illness   VITAL SIGNS: Temp:  [98.4 F (36.9 C)-99.2 F (37.3 C)] 98.5 F (36.9 C) (03/30 1200) Pulse Rate:  [95-114] 110 (03/30 1500) Resp:  [15-23] 21 (03/30 1500) BP: (87-121)/(50-76) 101/67 (03/30 1500) SpO2:  [93 %-100 %] 99 % (03/30 1500) FiO2 (%):  [24 %] 24 % (03/30 1500) Weight:  [59.4 kg (130 lb 15.3 oz)] 59.4 kg (130 lb 15.3 oz) (03/30 0500)  Physical  Examination:  Awake and oriented and nonfocal neurological exam Tracheostomy in place, tolerating CPAP/PS, distress, BEAE no rales.  Minimal clear secretions S1 + S2 audible and no murmur Benign abdominal exam was normal peristalsis.  Tolerating tube feed No leg edema     ASSESSMENT / PLAN:  Acute on chronic respiratory failure.  Tracheostomy was placed on 3/28/201 -Vent and trach weaning as tolerated  GI bleeding with history of chronic gastric ulcer -PPI management as per GI.  Status post EGD colonoscopy is a consideration if he continues to have GI bleeding as per GI  Atelectasis.  Bibasilar airspace disease -Monitor CXR + CBC + FiO2 and consider starting empiric antimicrobial if develops SIRS.  Dysphagia -As per GI patient was able to swallow her secretions and recommendation to repeat swallow evaluation in the next 48 hours.  Feeding tube placement is considered if she continue to have dysphagia  Full code  DVT + GI prophylaxis.  Continue with supportive care.  Critical care time 35 minutes

## 2017-07-30 ENCOUNTER — Inpatient Hospital Stay: Payer: Medicaid Other

## 2017-07-30 DIAGNOSIS — F322 Major depressive disorder, single episode, severe without psychotic features: Secondary | ICD-10-CM | POA: Diagnosis present

## 2017-07-30 LAB — CBC WITH DIFFERENTIAL/PLATELET
Basophils Absolute: 0 10*3/uL (ref 0–0.1)
Basophils Relative: 0 %
EOS ABS: 0.2 10*3/uL (ref 0–0.7)
Eosinophils Relative: 2 %
HEMATOCRIT: 23.2 % — AB (ref 35.0–47.0)
HEMOGLOBIN: 7.5 g/dL — AB (ref 12.0–16.0)
LYMPHS ABS: 0.7 10*3/uL — AB (ref 1.0–3.6)
Lymphocytes Relative: 6 %
MCH: 29.2 pg (ref 26.0–34.0)
MCHC: 32.1 g/dL (ref 32.0–36.0)
MCV: 90.9 fL (ref 80.0–100.0)
MONO ABS: 0.7 10*3/uL (ref 0.2–0.9)
Monocytes Relative: 6 %
NEUTROS ABS: 11.2 10*3/uL — AB (ref 1.4–6.5)
NEUTROS PCT: 86 %
Platelets: 353 10*3/uL (ref 150–440)
RBC: 2.56 MIL/uL — ABNORMAL LOW (ref 3.80–5.20)
RDW: 17.2 % — ABNORMAL HIGH (ref 11.5–14.5)
WBC: 12.8 10*3/uL — ABNORMAL HIGH (ref 3.6–11.0)

## 2017-07-30 LAB — GLUCOSE, CAPILLARY
GLUCOSE-CAPILLARY: 110 mg/dL — AB (ref 65–99)
Glucose-Capillary: 114 mg/dL — ABNORMAL HIGH (ref 65–99)
Glucose-Capillary: 115 mg/dL — ABNORMAL HIGH (ref 65–99)
Glucose-Capillary: 120 mg/dL — ABNORMAL HIGH (ref 65–99)

## 2017-07-30 LAB — POTASSIUM: Potassium: 3.8 mmol/L (ref 3.5–5.1)

## 2017-07-30 LAB — MAGNESIUM: Magnesium: 1.7 mg/dL (ref 1.7–2.4)

## 2017-07-30 LAB — PHOSPHORUS: PHOSPHORUS: 3.4 mg/dL (ref 2.5–4.6)

## 2017-07-30 MED ORDER — MIRTAZAPINE 15 MG PO TBDP
15.0000 mg | ORAL_TABLET | Freq: Every day | ORAL | Status: DC
  Administered 2017-07-30 – 2017-08-06 (×8): 15 mg via ORAL
  Filled 2017-07-30 (×8): qty 1

## 2017-07-30 MED ORDER — ZINC OXIDE 40 % EX OINT
TOPICAL_OINTMENT | Freq: Every day | CUTANEOUS | Status: DC
  Administered 2017-07-30: 1 via TOPICAL
  Administered 2017-07-31 – 2017-08-12 (×13): via TOPICAL
  Administered 2017-08-13: 1 via TOPICAL
  Administered 2017-08-14 – 2017-08-17 (×4): via TOPICAL
  Filled 2017-07-30: qty 114

## 2017-07-30 MED ORDER — SIMETHICONE 40 MG/0.6ML PO SUSP
40.0000 mg | Freq: Four times a day (QID) | ORAL | Status: DC | PRN
  Administered 2017-07-30 – 2017-08-01 (×2): 40 mg
  Filled 2017-07-30 (×3): qty 0.6

## 2017-07-30 NOTE — Progress Notes (Signed)
Patient's NG tube advanced at 0900 this morning per Dr. Soyla Murphy. Patient c/o no BM for 8 days, suppository given. Patient able to have 2 BMs. Patient tried a SBT this morning, but was unable to complete d/t high anxiety and c/o needing "to breathe". Patient agreed to work with PT, however after SBT patient was too tired and did not complete. Patient was again placed on SBT this afternoon and was able to tolerate for several hours, but became anxious and was placed back on full support. Patient's son at bedside and updated, no questions currently. Will continue to assess. Kayla Boyer

## 2017-07-30 NOTE — Progress Notes (Signed)
PT Cancellation Note  Patient Details Name: Kayla Boyer MRN: [id number] DOB: 1955-09-06   Cancelled Treatment:    Reason Eval/Treat Not Completed: Patient not medically ready. Evaluation attempted, however respiratory in room currently decreasing vent settings. Pt with increased work of breathing at this time and couldn't concentrate on therapy. Pt unable to talk due to vent, however writes on paper. Pt requesting "I need to work on breathing right now". Will re-attempt next date if able to participate.   Charmine Bockrath 07/30/2017, 11:23 AM  Greggory Stallion, PT, DPT [phone]

## 2017-07-30 NOTE — Progress Notes (Signed)
Awaiting swallow study, if fails then will need PEG tube placement  Dr Jonathon Bellows MD,MRCP [facility]) Gastroenterology/Hepatology Pager: [phone]

## 2017-07-30 NOTE — Progress Notes (Signed)
[name] [facility] [place]                                                                                                                                                                                  Kayla Boyer Demographics   Kayla Boyer, is a 62 y.o. female, DOB - January 12, 1956, ULA:[id number]  Admit date - 07/14/2017   Admitting Physician Saundra Shelling, MD  Outpatient Primary MD for the Kayla Boyer is Kayla Boyer, [name]   LOS - 25  Subjectiveshe is s/p tracheostomy with ventilator    Review of Systems:  On ventilator support via tracheostomy Opens eyes and communicate some verbal commands Complete review of systems not possible  Vitals:   Vitals:   07/30/17 0500 07/30/17 0600 07/30/17 0751 07/30/17 0800  BP: 115/84 119/80  115/74  Pulse: 94 92  88  Resp: 15 16  15   Temp:    98.9 F (37.2 C)  TempSrc:    Oral  SpO2: 100% 100% 99% 100%  Weight:      Height:        Wt Readings from Last 3 Encounters:  07/30/17 61.7 kg (136 lb 0.4 oz)  07/05/17 60.8 kg (134 lb)  07/04/17 60.8 kg (134 lb)     Intake/Output Summary (Last 24 hours) at 07/30/2017 1153 Last data filed at 07/30/2017 0616 Gross per 24 hour  Intake 1210 ml  Output 1600 ml  Net -390 ml    Physical Exam:   GENERAL: 62 year old female Kayla Boyer lying on the bed in the stepdown unit on BiPAP HEAD, EYES, EARS, NOSE AND THROAT: Atraumatic, normocephalic. Extraocular muscles are intact. Pupils equal and reactive to light. Sclerae anicteric. No conjunctival injection. No oro-pharyngeal erythema.  NECK: Supple. There is no jugular venous distention. No bruits, no lymphadenopathy, no thyromegaly.  Kayla Boyer has trach in the neck.   HEART: Regular rate and rhythm,. No murmurs, no rubs, no clicks.  LUNGS: Clear to auscultation bilaterally. No rales or rhonchi. No wheezes.  On ventilator support via tracheostomy ABDOMEN: Soft, flat, nontender, nondistended. Has good bowel sounds. No hepatosplenomegaly  appreciated.  EXTREMITIES: No evidence of any cyanosis, clubbing.  +2 pedal and radial pulses bilaterally.  1+ pedal edema NEUROLOGIC: The Kayla Boyer is alert, arousable, and oriented x2 with   Decreased hearing, left arm weakness improved Right pupil greater than left pupil SKIN: Moist and warm with no rashes appreciated.  Psych: Not be assessed LN: No inguinal LN enlargement    Antibiotics   Anti-infectives (From admission, onward)   Start     Dose/Rate Route Frequency Ordered Stop   07/14/17 2200  azithromycin (ZITHROMAX) 500 mg in sodium chloride 0.9 %  250 mL IVPB  Status:  Discontinued     500 mg 250 mL/hr over 60 Minutes Intravenous Every 24 hours 07/14/17 2122 07/18/17 0915      Medications   Scheduled Meds: . chlorhexidine  15 mL Mouth Rinse BID  . sennosides  5 mL Per Tube BID   And  . docusate  50 mg Per Tube BID  . free water  200 mL Per Tube Q6H  . insulin aspart  0-9 Units Subcutaneous Q6H  . ipratropium-albuterol  3 mL Nebulization Q4H  . mouth rinse  15 mL Mouth Rinse q12n4p  . multivitamin  15 mL Per Tube Daily  . pantoprazole sodium  40 mg Per Tube BID  . sodium chloride flush  10-40 mL Intracatheter Q12H  . thiamine  100 mg Per Tube Daily  . thyroid  30 mg Per Tube QAC breakfast   Continuous Infusions: . dextrose 25 mL/hr (07/30/17 0428)  . feeding supplement (VITAL HIGH PROTEIN) 1,000 mL (07/30/17 0428)   PRN Meds:.acetaminophen (TYLENOL) oral liquid 160 mg/5 mL, acetaminophen **OR** acetaminophen, ALPRAZolam, bisacodyl, fentaNYL (SUBLIMAZE) injection, metoprolol tartrate, morphine injection, [DISCONTINUED] ondansetron **OR** ondansetron (ZOFRAN) IV, ondansetron (ZOFRAN) IV, sodium chloride flush   Data Review:   Micro Results No results found for this or any previous visit (from the past 240 hour(s)).  Radiology Reports Dg Abd 1 View  Result Date: 07/22/2017 CLINICAL DATA:  Encounter for feeding tube placement. EXAM: ABDOMEN - 1 VIEW COMPARISON:   Chest radiograph 07/22/2017 FINDINGS: A nasogastric tube has been advanced into the abdomen. Catheter tip is in the distal stomach region. Bowel gas throughout the abdomen. Persistent densities at the left lung base and the left hemidiaphragm remains obscured. IMPRESSION: Feeding tube tip in the distal stomach region. Electronically Signed   By: Markus Daft M.D.   On: 07/22/2017 16:30   Ct Head Wo Contrast  Result Date: 07/23/2017 CLINICAL DATA:  62 y/o F; changes to left pupil in strength of left arm. EXAM: CT HEAD WITHOUT CONTRAST TECHNIQUE: Contiguous axial images were obtained from the base of the skull through the vertex without intravenous contrast. COMPARISON:  None. FINDINGS: Brain: No evidence of acute infarction, hemorrhage, hydrocephalus, extra-axial collection or mass lesion/mass effect. Vascular: Calcific atherosclerosis of carotid siphons. No hyperdense vessel. Skull: Normal. Negative for fracture or focal lesion. Sinuses/Orbits: No acute finding. Other: None. IMPRESSION: Negative CT of the head. Electronically Signed   By: Kristine Garbe M.D.   On: 07/23/2017 14:33   Nm Gi Blood Loss  Result Date: 07/20/2017 CLINICAL DATA:  GI bleeding EXAM: NUCLEAR MEDICINE GASTROINTESTINAL BLEEDING SCAN TECHNIQUE: Sequential abdominal images were obtained following intravenous administration of Tc-62m labeled red blood cells. RADIOPHARMACEUTICALS:  Twenty mCi Tc-37m pertechnetate in-vitro labeled red cells. COMPARISON:  None. FINDINGS: Normal distribution of radiotracer is noted. No focal area of tracer accumulation is identified to suggest active GI bleeding is noted. IMPRESSION: No findings to suggest active GI hemorrhage. Electronically Signed   By: Inez Catalina M.D.   On: 07/20/2017 15:16   US Venous Img Upper Bilat  Result Date: 07/16/2017 CLINICAL DATA:  Bilateral upper extremity edema acutely with pain and discomfort for 1 day EXAM: BILATERAL UPPER EXTREMITY VENOUS DOPPLER ULTRASOUND  TECHNIQUE: Gray-scale sonography with graded compression, as well as color Doppler and duplex ultrasound were performed to evaluate the bilateral upper extremity deep venous systems from the level of the subclavian vein and including the jugular, axillary, basilic, radial, ulnar and upper cephalic vein. Spectral Doppler was utilized  to evaluate flow at rest and with distal augmentation maneuvers. COMPARISON:  None. FINDINGS: Exam is limited because of upper extremity subcutaneous edema. RIGHT UPPER EXTREMITY Internal Jugular Vein: No evidence of thrombus. Normal compressibility, respiratory phasicity and response to augmentation. Subclavian Vein: No evidence of thrombus. Normal compressibility, respiratory phasicity and response to augmentation. Axillary Vein: No evidence of thrombus. Normal compressibility, respiratory phasicity and response to augmentation. Cephalic Vein: No evidence of thrombus. Normal compressibility, respiratory phasicity and response to augmentation. Basilic Vein: Not well visualized to accurately assess. Brachial Veins: No evidence of thrombus. Normal compressibility, respiratory phasicity and response to augmentation. Radial Veins: No evidence of thrombus. Normal compressibility, respiratory phasicity and response to augmentation. Ulnar Veins: No evidence of thrombus. Normal compressibility, respiratory phasicity and response to augmentation. Venous Reflux:  None. Other Findings:  Peripheral edema noted LEFT UPPER EXTREMITY Internal Jugular Vein: No evidence of thrombus. Normal compressibility, respiratory phasicity and response to augmentation. Subclavian Vein: No evidence of thrombus. Normal compressibility, respiratory phasicity and response to augmentation. Axillary Vein: No evidence of thrombus. Normal compressibility, respiratory phasicity and response to augmentation. Cephalic Vein: No evidence of thrombus. Normal compressibility, respiratory phasicity and response to augmentation.  Basilic Vein: No evidence of thrombus. Normal compressibility, respiratory phasicity and response to augmentation. Brachial Veins: No evidence of thrombus. Normal compressibility, respiratory phasicity and response to augmentation. Radial Veins: No evidence of thrombus. Normal compressibility, respiratory phasicity and response to augmentation. Ulnar Veins: No evidence of thrombus. Normal compressibility, respiratory phasicity and response to augmentation. Venous Reflux:  None. Other Findings:  But peripheral subcutaneous edema noted. IMPRESSION: Limited exam but no gross occlusive upper extremity DVT in either arm. Electronically Signed   By: Jerilynn Mages.  Shick M.D.   On: 07/16/2017 12:34   US Venous Img Upper Uni Left  Result Date: 07/25/2017 CLINICAL DATA:  Left upper extremity edema. EXAM: LEFT UPPER EXTREMITY VENOUS DOPPLER ULTRASOUND TECHNIQUE: Gray-scale sonography with graded compression, as well as color Doppler and duplex ultrasound were performed to evaluate the upper extremity deep venous system from the level of the subclavian vein and including the jugular, axillary, basilic, radial, ulnar and upper cephalic vein. Spectral Doppler was utilized to evaluate flow at rest and with distal augmentation maneuvers. COMPARISON:  None. FINDINGS: Contralateral Subclavian Vein: Respiratory phasicity is normal and symmetric with the symptomatic side. No evidence of thrombus. Normal compressibility. Internal Jugular Vein: No evidence of thrombus. Normal compressibility, respiratory phasicity and response to augmentation. Subclavian Vein: No evidence of thrombus. Normal compressibility, respiratory phasicity and response to augmentation. Axillary Vein: No evidence of thrombus. Normal compressibility, respiratory phasicity and response to augmentation. Cephalic Vein: No evidence of thrombus. Normal compressibility, respiratory phasicity and response to augmentation. Basilic Vein: No evidence of thrombus. Normal  compressibility, respiratory phasicity and response to augmentation. Brachial Veins: No evidence of thrombus. Normal compressibility, respiratory phasicity and response to augmentation. Radial Veins: No evidence of thrombus. Normal compressibility, respiratory phasicity and response to augmentation. Ulnar Veins: No evidence of thrombus. Normal compressibility, respiratory phasicity and response to augmentation. Venous Reflux:  None visualized. Other Findings: There is significant edema noted in the left upper arm including some more locally marginated fluid in the subcutaneous tissues of the left medial upper arm. This fluid appears to extend over a long distance and likely represents subcutaneous fluid which is more prominent in 1 area of the upper arm. If there is any concern for soft tissue infection, additional imaging may be warranted. IMPRESSION: 1. No evidence of left upper extremity deep  venous thrombosis. 2. Significant subcutaneous fluid in the left upper arm with an area of more marginated subcutaneous fluid also identified in the medial left upper arm. Although this does not have the appearance of a focal abscess by ultrasound, if there is concern for upper extremity infection, additional imaging may be warranted with CT and/or MRI. Electronically Signed   By: Aletta Edouard M.D.   On: 07/25/2017 17:48   Dg Chest Port 1 View  Result Date: 07/30/2017 CLINICAL DATA:  Pneumonia EXAM: PORTABLE CHEST 1 VIEW COMPARISON:  07/29/2017 and prior radiographs FINDINGS: A tracheostomy tube, RIGHT PICC line with tip overlying the mid-LOWER SVC and endotracheal tube with tip overlying the distal esophagus again noted. Decreased LEFT LOWER lung consolidation/atelectasis noted. Mild RIGHT basilar atelectasis again noted. There is no evidence of pneumothorax. No other interval changes identified. IMPRESSION: 1. Decreased LEFT LOWER lung consolidation/atelectasis. 2. NG tube with tip overlying the distal  esophagus-recommend advancement. Electronically Signed   By: Margarette Canada M.D.   On: 07/30/2017 07:49   Dg Chest Port 1 View  Result Date: 07/29/2017 CLINICAL DATA:  Acute on chronic respiratory failure. EXAM: PORTABLE CHEST 1 VIEW COMPARISON:  Radiograph July 27, 2017. FINDINGS: Stable cardiomegaly. Tracheostomy is in grossly good position. Distal tip of nasogastric tube is seen in distal esophagus. No pneumothorax is noted. Right-sided PICC line is noted with distal tip in expected position of SVC. Mild bibasilar subsegmental atelectasis is noted. Bony thorax is unremarkable. IMPRESSION: Mild bibasilar subsegmental atelectasis. Endotracheal tube in grossly good position. Distal tip of nasogastric tube has withdrawn into distal esophagus. Electronically Signed   By: Marijo Conception, M.D.   On: 07/29/2017 22:01   Dg Chest Port 1 View  Result Date: 07/27/2017 CLINICAL DATA:  Excessive fluid EXAM: PORTABLE CHEST 1 VIEW COMPARISON:  Chest x-ray of July 23, 2017 FINDINGS: The lungs are well-expanded. There is a trace of pleural fluid on the right and slightly more on the left. The retrocardiac region remains dense. The cardiac silhouette is enlarged. The central pulmonary vascularity is prominent. The mediastinum is normal in width. The trachea is midline. The right-sided PICC line tip projects over the midportion of the SVC. The tracheostomy tube tip projects at the superior margin of the clavicular heads. The bony thorax exhibits no acute abnormality. IMPRESSION: Slight improved aeration of both lungs today. Small amounts of pleural fluid at both bases. Persistent atelectasis or pneumonia in the left lower lobe. Top-normal cardiac size with central pulmonary vascular congestion but no definite pulmonary edema. Electronically Signed   By: David  Martinique M.D.   On: 07/27/2017 10:55   Dg Chest Port 1 View  Result Date: 07/23/2017 CLINICAL DATA:  Acute on chronic respiratory failure. Airway aspiration. EXAM:  PORTABLE CHEST 1 VIEW COMPARISON:  07/22/2017. FINDINGS: Interval enlargement of the cardiac silhouette. Stable dense left lower lobe airspace opacity. Interval increased linear and patchy density at the right lung base. Stable small bilateral pleural effusions. Nasogastric tube extending into the stomach. Thoracic spine degenerative changes. IMPRESSION: 1. No significant change in dense left lower lobe atelectasis or pneumonia and small left pleural effusion. 2. Increased right basilar atelectasis or pneumonia with a stable small right pleural effusion. Electronically Signed   By: Claudie Revering M.D.   On: 07/23/2017 16:09   Dg Chest Port 1 View  Result Date: 07/22/2017 CLINICAL DATA:  Respiratory failure.  COPD. EXAM: PORTABLE CHEST 1 VIEW COMPARISON:  07/19/2017. FINDINGS: The cardiac silhouette remains borderline enlarged. Stable left pleural  effusion and left basilar airspace opacity. Small right pleural effusion, decreased, with decreased adjacent right basilar atelectasis. Thoracic spine degenerative changes. IMPRESSION: 1. Stable left pleural effusion and left lower lobe atelectasis or pneumonia. 2. Decreased right pleural fluid and right basilar atelectasis. Electronically Signed   By: Claudie Revering M.D.   On: 07/22/2017 09:14   Dg Chest Port 1 View  Result Date: 07/19/2017 CLINICAL DATA:  Respiratory failure EXAM: PORTABLE CHEST 1 VIEW COMPARISON:  July 15, 2017 FINDINGS: There are small pleural effusions bilaterally. There is consolidation in the left lower lobe. Lungs elsewhere are clear. Heart size is upper normal with pulmonary vascularity within normal limits. No adenopathy. There is degenerative change in the thoracic spine. IMPRESSION: Left lower lobe consolidation with small pleural effusions bilaterally. There is mild bibasilar atelectasis. Stable cardiac silhouette. Electronically Signed   By: Lowella Grip III M.D.   On: 07/19/2017 07:57   Dg Chest Port 1 View  Result Date:  07/15/2017 CLINICAL DATA:  Acute respiratory failure. EXAM: PORTABLE CHEST 1 VIEW COMPARISON:  Chest x-ray from yesterday FINDINGS: The heart size and mediastinal contours are within normal limits. Normal pulmonary vascularity. Unchanged left greater than right bibasilar atelectasis and scarring at the costophrenic angles. No focal consolidation, pleural effusion, or pneumothorax. No acute osseous abnormality. IMPRESSION: Stable bibasilar atelectasis/scarring. Electronically Signed   By: Titus Dubin M.D.   On: 07/15/2017 10:41   Dg Chest Portable 1 View  Result Date: 07/14/2017 CLINICAL DATA:  Shortness of breath. EXAM: PORTABLE CHEST 1 VIEW COMPARISON:  06/05/2017. FINDINGS: Mediastinum and hilar structures normal. Stable mild cardiomegaly with normal pulmonary vascularity. Low lung volumes with mild basilar atelectasis. Stable bilateral pleural thickening consistent scarring. Degenerative changes thoracic spine. IMPRESSION: Low lung volumes with mild basilar atelectasis and/or pleuroparenchymal scarring again noted. Stable mild cardiomegaly. Chest is stable from prior exam. Electronically Signed   By: Marcello Moores  Register   On: 07/14/2017 17:13   Ct Renal Stone Study  Result Date: 07/04/2017 CLINICAL DATA:  62 year old female with blood on pad. Question rectal bleeding, vaginal bleeding or blood in urine. Initial encounter. EXAM: CT ABDOMEN AND PELVIS WITHOUT CONTRAST TECHNIQUE: Multidetector CT imaging of the abdomen and pelvis was performed following the standard protocol without IV contrast. COMPARISON:  06/05/2017 CT. FINDINGS: Lower chest: Bibasilar atelectasis greater left once again noted. Hepatobiliary: Multiple gallstones. No CT evidence of surrounding inflammation. If Kayla Boyer had right upper quadrant tenderness, ultrasound can be obtained for further delineation. Taking into account limitation by non contrast imaging, no worrisome liver lesion. Pancreas: Taking into account limitation by non  contrast imaging, no worrisome pancreatic lesion or inflammation. Spleen: Taking into account limitation by non contrast imaging, no worrisome splenic lesion or enlargement. Adrenals/Urinary Tract: When compared to the recent CT, there is mild fullness of the right renal collecting system without obstructing stone identified. This may be related to recently passed stone. Bilateral tiny/small nonobstructing renal calculi are noted (largest left lower pole measures 3.5 mm). Taking into account limitation by non contrast imaging, no worrisome renal or adrenal lesion. Noncontrast filled views of the urinary bladder unremarkable. Stomach/Bowel: Moderate stool rectosigmoid region. Superior extension sigmoid colon. No extraluminal bowel inflammatory process noted. Under distended stomach without gross abnormality. Tiny lipoma third portion of the duodenum. Vascular/Lymphatic: Aortic and iliac artery calcification. No aneurysm. No adenopathy. Reproductive: Retroverted uterus.  No adnexal mass. Other: No free intraperitoneal air or bowel containing hernia. Musculoskeletal: No worrisome osseous abnormality. IMPRESSION: When compared to recent CT, there is mild  fullness of the right renal collecting system without obstructing stone identified. This may be related to recently passed stone or blood clot. Bilateral tiny/small nonobstructing renal calculi. Multiple gallstones. Bibasilar atelectasis greater left once again noted. Moderate stool rectosigmoid region. Superior extension sigmoid colon. No extraluminal bowel inflammatory process noted. Tiny lipoma third portion of the duodenum. Aortic Atherosclerosis (ICD10-I70.0). Electronically Signed   By: Genia Del M.D.   On: 07/04/2017 08:03   [place] Ekg Site Rite  Result Date: 07/25/2017 If Site Rite image not attached, placement could not be confirmed due to current cardiac rhythm.    CBC Recent Labs  Lab 07/25/17 0743 07/26/17 0502 07/30/17 0459  WBC 11.5* 9.3 12.8*   HGB 9.8* 8.7* 7.5*  HCT 31.2* 26.0* 23.2*  PLT 373 361 353  MCV 92.9 91.3 90.9  MCH 29.1 30.4 29.2  MCHC 31.3* 33.3 32.1  RDW 17.3* 17.4* 17.2*  LYMPHSABS 0.5* 0.3* 0.7*  MONOABS 0.8 0.9 0.7  EOSABS 0.3 0.3 0.2  BASOSABS 0.0 0.0 0.0    Chemistries  Recent Labs  Lab 07/25/17 0743  07/26/17 0502 07/27/17 0401 07/28/17 0519 07/29/17 0808 07/30/17 0459  NA 139  --  141 138 135 135  --   K 2.8*   < > 4.4 3.9 3.9 3.6 3.8  CL 89*  --  95* 92* 92* 94*  --   CO2 40*  --  39* 38* 33* 32  --   GLUCOSE 119*  --  127* 112* 138* 120*  --   BUN 12  --  9 12 14 15   --   CREATININE <0.30*  --  <0.30* <0.30* <0.30* <0.30*  --   CALCIUM 8.4*  --  8.5* 8.8* 8.8* 8.4*  --   MG 1.9  --  1.7  --  1.6* 1.8 1.7   < > = values in this interval not displayed.   ------------------------------------------------------------------------------------------------------------------ CrCl cannot be calculated (This lab value cannot be used to calculate CrCl because it is not a number: <0.30). ------------------------------------------------------------------------------------------------------------------ No results for input(s): HGBA1C in the last 72 hours. ------------------------------------------------------------------------------------------------------------------ No results for input(s): CHOL, HDL, LDLCALC, TRIG, CHOLHDL, LDLDIRECT in the last 72 hours. ------------------------------------------------------------------------------------------------------------------ No results for input(s): TSH, T4TOTAL, T3FREE, THYROIDAB in the last 72 hours.  Invalid input(s): FREET3 ------------------------------------------------------------------------------------------------------------------ No results for input(s): VITAMINB12, FOLATE, FERRITIN, TIBC, IRON, RETICCTPCT in the last 72 hours.  Coagulation profile Recent Labs  Lab 07/25/17 0743  INR 0.93    No results for input(s): DDIMER in the last 72  hours.  Cardiac Enzymes No results for input(s): CKMB, TROPONINI, MYOGLOBIN in the last 168 hours.  Invalid input(s): CK ------------------------------------------------------------------------------------------------------------------ Invalid input(s): POCBNP    Assessment & Plan   62 year old female Kayla Boyer with history of chronic respiratory failure, GI bleed currently in the hospital for acute on chronic hypercapnic respiratory failure and anemia secondary to slow GI bleed.  1.Acute on chronic hypercapnic respiratory failure S/p tracheostomy on ventilator support Impaired ventilatory drive and global muscle weakness Appreciate intensivist management.  2. Moderate malnutrition  Feeding supplements, continue NG tube feeding.  3.  Moderate malnutrition  Continue NG tube feeding, appreciate GI following.  Awaiting swallow evaluation if Kayla Boyer fails swallow evaluation then GI to do PEg 4. Electrolyte imbalance; improving F/u magnesium and potassium  5.  Acute on chronic gastrointestinal bleeding s/p EGD 3/18 showing Normal duodenal bulb and second portion of the duodenum.Duodenal lipoma. Non-bleeding gastric ulcer with a clean ulcer base (Forrest Class III).Normal gastroesophageal junction and esophagus. -  protonix 40 mg IV bid - 6. DVT prophylaxis with SCD  7. Hypomagnesemia : Replace magnesium  8.  Management follow-up for possible LTAC placement          Code Status Orders  (From admission, onward)        Start     Ordered   07/14/17 2115  Full code  Continuous     07/14/17 2122    Code Status History    Date Active Date Inactive Code Status Order ID Comments User Context   06/05/2017 1654 06/08/2017 1820 Full Code [id number]  Bettey Costa, MD Inpatient   11/01/2016 0121 11/09/2016 2044 Full Code [id number]  Lance Coon, MD Inpatient       Time Spent in minutes   35 minutes  Greater than 50% of time spent in care coordination and counseling Kayla Boyer  regarding the condition and plan of care.   Epifanio Lesches M.D on 07/30/2017 at 11:53 AM  Between 7am to 6pm - Pager - [phone]  After 6pm go to [email or web address] - Proofreader  [employer]   Office  [phone]

## 2017-07-30 NOTE — Consult Note (Addendum)
[facility] Face-to-Face Psychiatry Consult   Reason for Consult:  Depression Referring Physician:  Dr. Kandy Garrison Patient Identification: Kayla Boyer MRN:  [id number] Principal Diagnosis: Major depressive disorder, single episode, severe without psychotic features [facility]) Diagnosis:   Patient Active Problem List   Diagnosis Date Noted  . Major depressive disorder, single episode, severe without psychotic features ([place]) [F32.2] 07/30/2017  . Acute on chronic respiratory failure with hypercapnia (HCC) [J96.22]   . Protein-calorie malnutrition ([place]) [E46]   . At high risk for aspiration [Z91.89]   . Palliative care by specialist [Z51.5]   . Acute respiratory failure ([place]) [J96.00]   . Goals of care, counseling/discussion [Z71.89]   . Palliative care encounter [Z51.5]   . Chronic gastric ulcer without hemorrhage and without perforation [K25.7]   . GI bleed [K92.2] 07/14/2017  . Anemia [D64.9] 06/05/2017  . Fall [W19.XXXA] 10/31/2016  . Tachycardia [R00.0] 10/31/2016  . HLD (hyperlipidemia) [E78.5] 10/31/2016    Total Time spent with patient: 1 hour   Identifying data. Ms. Kayla Boyer is a 62 year old female with no past psychiatric history.   Chief complaint. "I'm tired."  History of present illness. Information was obtained from the patient and the chart. The patient was admitted to medical floor 2 weeks ago for GI bleed. She is now in ICU with a tracheostomy and on ventilator. Her nurse felt, that the patient is depressed and has not been trying hard enough to feel better. She does not want to breathe on her own and "likes" to be on respirator. When spoke with respiratory therapist, I was getting the impression that the patient has NOT BEEN ABLE, rather than unwilling, to participate in treatment. She has also been constipated, reportedly 8 days without bowel movement unable to move her diaphragm.   The patient feels tired but is very eager to participate in the interview using a clipboard. 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 She confirms that she feels week, has low energy and decrease interest. She denies feeling suicidal or homicidal. She is not psychotic. She reports infrequent panic attacks but no other forms of anxiety. Her sleep and appetite are poor. In spite of adversities, she is hopeful for the future.   While in the hospital, the patient lost her brother who died of kidney failure. The patient was unable to go the funeral.  Past psychiatric history. No hospitalizations or suicide attempts. Never seen a psychiatrist. She thinks that she could have been given antidepressant over the years by her PCP.  Family psychiatric history. None reported.  Social history. She used to work as a [employer]ar[employer] manager. She has supportive son.   Past Medical History:  Past Medical History:  Diagnosis Date  . Emphysema of lung ([place])   . GI bleed   . HLD (hyperlipidemia)     Past Surgical History:  Procedure Laterality Date  . ESOPHAGOGASTRODUODENOSCOPY (EGD) WITH PROPOFOL N/A 07/17/2017   Procedure: ESOPHAGOGASTRODUODENOSCOPY (EGD) WITH PROPOFOL;  Surgeon: Lin Landsman, MD;  Location: [facility];  Service: Gastroenterology;  Laterality: N/A;  . NO PAST SURGERIES    . OOPHORECTOMY Left   . TRACHEOSTOMY TUBE PLACEMENT N/A 07/27/2017   Procedure: TRACHEOSTOMY;  Surgeon: Carloyn Manner, MD;  Location: [facility];  Service: ENT;  Laterality: N/A;   Family History:  Family History  Family history unknown: Yes   Social History:  Social History   Substance and Sexual Activity  Alcohol Use No  . Frequency: Never     Social  History   Substance and Sexual Activity  Drug Use No    Social History   Socioeconomic History  . Marital status: Married    Spouse name: Not on file  . Number of children: Not on file  . Years of education: Not on file  . Highest education level: Not on file  Occupational History  . Not on file  Social Needs  . Financial resource strain: Not  on file  . Food insecurity:    Worry: Not on file    Inability: Not on file  . Transportation needs:    Medical: Not on file    Non-medical: Not on file  Tobacco Use  . Smoking status: Never Smoker  . Smokeless tobacco: Never Used  Substance and Sexual Activity  . Alcohol use: No    Frequency: Never  . Drug use: No  . Sexual activity: Not on file  Lifestyle  . Physical activity:    Days per week: Not on file    Minutes per session: Not on file  . Stress: Not on file  Relationships  . Social connections:    Talks on phone: Not on file    Gets together: Not on file    Attends religious service: Not on file    Active member of club or organization: Not on file    Attends meetings of clubs or organizations: Not on file    Relationship status: Not on file  Other Topics Concern  . Not on file  Social History Narrative  . Not on file   Additional Social History:    Allergies:  No Known Allergies  Labs:  Results for orders placed or performed during the hospital encounter of 07/14/17 (from the past 48 hour(s))  Glucose, capillary     Status: Abnormal   Collection Time: 07/28/17  5:02 PM  Result Value Ref Range   Glucose-Capillary 144 (H) 65 - 99 mg/dL  Glucose, capillary     Status: Abnormal   Collection Time: 07/28/17  8:17 PM  Result Value Ref Range   Glucose-Capillary 100 (H) 65 - 99 mg/dL  Glucose, capillary     Status: Abnormal   Collection Time: 07/28/17 11:52 PM  Result Value Ref Range   Glucose-Capillary 120 (H) 65 - 99 mg/dL  Glucose, capillary     Status: Abnormal   Collection Time: 07/29/17  5:02 AM  Result Value Ref Range   Glucose-Capillary 124 (H) 65 - 99 mg/dL  Basic metabolic panel     Status: Abnormal   Collection Time: 07/29/17  8:08 AM  Result Value Ref Range   Sodium 135 135 - 145 mmol/L   Potassium 3.6 3.5 - 5.1 mmol/L   Chloride 94 (L) 101 - 111 mmol/L   CO2 32 22 - 32 mmol/L   Glucose, Bld 120 (H) 65 - 99 mg/dL   BUN 15 6 - 20 mg/dL    Creatinine, Ser <0.30 (L) 0.44 - 1.00 mg/dL   Calcium 8.4 (L) 8.9 - 10.3 mg/dL   GFR calc non Af Amer NOT CALCULATED >60 mL/min   GFR calc Af Amer NOT CALCULATED >60 mL/min    Comment: (NOTE) The eGFR has been calculated using the CKD EPI equation. This calculation has not been validated in all clinical situations. eGFR's persistently <60 mL/min signify possible Chronic Kidney Disease.    Anion gap 9 5 - 15    Comment: Performed at [facility], [address] [place], [place] [address]  Magnesium  Status: None   Collection Time: 07/29/17  8:08 AM  Result Value Ref Range   Magnesium 1.8 1.7 - 2.4 mg/dL    Comment: Performed at [facility], [address] [place], [place] [address]  Glucose, capillary     Status: Abnormal   Collection Time: 07/29/17  9:29 AM  Result Value Ref Range   Glucose-Capillary 138 (H) 65 - 99 mg/dL  Glucose, capillary     Status: Abnormal   Collection Time: 07/29/17  3:51 PM  Result Value Ref Range   Glucose-Capillary 133 (H) 65 - 99 mg/dL  Glucose, capillary     Status: Abnormal   Collection Time: 07/29/17 10:47 PM  Result Value Ref Range   Glucose-Capillary 104 (H) 65 - 99 mg/dL  Glucose, capillary     Status: Abnormal   Collection Time: 07/30/17  4:09 AM  Result Value Ref Range   Glucose-Capillary 114 (H) 65 - 99 mg/dL  Phosphorus     Status: None   Collection Time: 07/30/17  4:59 AM  Result Value Ref Range   Phosphorus 3.4 2.5 - 4.6 mg/dL    Comment: Performed at [facility], [address] [place], [place] [address]  Magnesium     Status: None   Collection Time: 07/30/17  4:59 AM  Result Value Ref Range   Magnesium 1.7 1.7 - 2.4 mg/dL    Comment: Performed at [facility], [address] [place], [place] [address]  Potassium     Status: None   Collection Time: 07/30/17  4:59 AM  Result Value Ref Range   Potassium 3.8 3.5 - 5.1 mmol/L    Comment: Performed at [facility], [address] [place], [place] [address]  CBC with Differential/Platelet     Status: Abnormal   Collection Time: 07/30/17  4:59 AM  Result Value Ref Range   WBC 12.8 (H) 3.6 - 11.0 K/uL   RBC 2.56 (L) 3.80 - 5.20 MIL/uL   Hemoglobin 7.5 (L) 12.0 - 16.0 g/dL   HCT 23.2 (L) 35.0 - 47.0 %   MCV 90.9 80.0 - 100.0 fL   MCH 29.2 26.0 - 34.0 pg   MCHC 32.1 32.0 - 36.0 g/dL   RDW 17.2 (H) 11.5 - 14.5 %   Platelets 353 150 - 440 K/uL   Neutrophils Relative % 86 %   Neutro Abs 11.2 (H) 1.4 - 6.5 K/uL   Lymphocytes Relative 6 %   Lymphs Abs 0.7 (L) 1.0 - 3.6 K/uL   Monocytes Relative 6 %   Monocytes Absolute 0.7 0.2 - 0.9 K/uL   Eosinophils Relative 2 %   Eosinophils Absolute 0.2 0 - 0.7 K/uL   Basophils Relative 0 %   Basophils Absolute 0.0 0 - 0.1 K/uL    Comment: Performed at [facility], [address] [place], [place] [address]  Glucose, capillary     Status: Abnormal   Collection Time: 07/30/17 10:52 AM  Result Value Ref Range   Glucose-Capillary 115 (H) 65 - 99 mg/dL    Current Facility-Administered Medications  Medication Dose Route Frequency Provider Last Rate Last Dose  . acetaminophen (TYLENOL) solution 650 mg  650 mg Per Tube Q6H PRN Nicholes Mango, MD   650 mg at 07/27/17 0227  . acetaminophen (TYLENOL) tablet 650 mg  650 mg Oral Q6H PRN Gouru, Aruna, MD       Or  . acetaminophen (TYLENOL) suppository 650 mg  650 mg Rectal Q6H PRN Gouru, Aruna,  MD      . ALPRAZolam Duanne Moron) tablet 0.25 mg  0.25 mg Per Tube TID PRN Gouru, Aruna, MD   0.25 mg at 07/25/17 1459  . bisacodyl (DULCOLAX) suppository 10 mg  10 mg Rectal Daily PRN Lafayette Dragon, MD   10 mg at 07/22/17 1409  . chlorhexidine (PERIDEX) 0.12 % solution 15 mL  15 mL Mouth Rinse BID Dorene Sorrow S, NP   15 mL at 07/29/17 2251  . dextrose 5 % solution   Intravenous Continuous Cassandria Santee, MD 25 mL/hr at 07/30/17 0428 25 mL/hr at 07/30/17 0428  . sennosides (SENOKOT) 8.8 MG/5ML syrup 5 mL  5 mL Per Tube BID Gouru,  Aruna, MD   5 mL at 07/30/17 1106   And  . docusate (COLACE) 50 MG/5ML liquid 50 mg  50 mg Per Tube BID Gouru, Aruna, MD   50 mg at 07/30/17 1106  . feeding supplement (VITAL HIGH PROTEIN) liquid 1,000 mL  1,000 mL Per Tube Continuous Saundra Shelling, MD 50 mL/hr at 07/30/17 0428 1,000 mL at 07/30/17 0428  . fentaNYL (SUBLIMAZE) injection 25 mcg  25 mcg Intravenous Q5 min PRN Gunnar Bulla, MD      . free water 200 mL  200 mL Per Tube Q6H Pyreddy, Pavan, MD   200 mL at 07/30/17 1245  . insulin aspart (novoLOG) injection 0-9 Units  0-9 Units Subcutaneous Q6H Lafayette Dragon, MD   1 Units at 07/29/17 1643  . ipratropium-albuterol (DUONEB) 0.5-2.5 (3) MG/3ML nebulizer solution 3 mL  3 mL Nebulization Q4H Awilda Bill, NP   3 mL at 07/30/17 1105  . MEDLINE mouth rinse  15 mL Mouth Rinse q12n4p Tukov, Magadalene S, NP   15 mL at 07/29/17 1643  . metoprolol tartrate (LOPRESSOR) injection 2.5-5 mg  2.5-5 mg Intravenous Q6H PRN Awilda Bill, NP   2.5 mg at 07/28/17 1430  . morphine 2 MG/ML injection 2 mg  2 mg Intravenous Q4H PRN Lafayette Dragon, MD   2 mg at 07/30/17 0400  . multivitamin liquid 15 mL  15 mL Per Tube Daily Lafayette Dragon, MD   15 mL at 07/30/17 1105  . ondansetron (ZOFRAN) injection 4 mg  4 mg Intravenous Q6H PRN Saundra Shelling, MD   4 mg at 07/30/17 1102  . ondansetron (ZOFRAN) injection 4 mg  4 mg Intravenous Once PRN Gunnar Bulla, MD      . pantoprazole sodium (PROTONIX) 40 mg/20 mL oral suspension 40 mg  40 mg Per Tube BID Gouru, Aruna, MD   40 mg at 07/30/17 1106  . sodium chloride flush (NS) 0.9 % injection 10-40 mL  10-40 mL Intracatheter Q12H Gouru, Aruna, MD   10 mL at 07/30/17 1107  . sodium chloride flush (NS) 0.9 % injection 10-40 mL  10-40 mL Intracatheter PRN Gouru, Aruna, MD      . thiamine (VITAMIN B-1) tablet 100 mg  100 mg Per Tube Daily Lafayette Dragon, MD   100 mg at 07/30/17 1106  . thyroid (ARMOUR) tablet 30 mg  30 mg Per Tube QAC breakfast Gouru,  Aruna, MD   30 mg at 07/30/17 0820    Musculoskeletal: Strength & Muscle Tone: within normal limits Gait & Station: normal Patient leans: N/A  Psychiatric Specialty Exam: Physical Exam  Nursing note and vitals reviewed. Psychiatric: Her behavior is normal. Judgment and thought content normal. Cognition and memory are normal. She exhibits a depressed mood. She is noncommunicative.  Review of Systems  Neurological: Negative.   Psychiatric/Behavioral: Positive for depression. The patient has insomnia.   All other systems reviewed and are negative.   Blood pressure 134/83, pulse (!) 110, temperature 99 F (37.2 C), temperature source Oral, resp. rate 16, height 5' 2.5" (1.588 m), weight 61.7 kg (136 lb 0.4 oz), SpO2 99 %.Body mass index is 24.48 kg/m.  General Appearance: Casual  Eye Contact:  Good  Speech:  tracheostomy  Volume:  Decreased  Mood:  Anxious  Affect:  Appropriate  Thought Process:  Goal Directed and Descriptions of Associations: Intact  Orientation:  Full (Time, Place, and Person)  Thought Content:  WDL  Suicidal Thoughts:  No  Homicidal Thoughts:  No  Memory:  Immediate;   Fair Recent;   Fair Remote;   Fair  Judgement:  Impaired  Insight:  Present  Psychomotor Activity:  Decreased  Concentration:  Concentration: Fair and Attention Span: Fair  Recall:  [employer] of Knowledge:  Fair  Language:  Fair  Akathisia:  No  Handed:  Right  AIMS (if indicated):     Assets:  Communication Skills Desire for Improvement Financial Resources/Insurance Housing Resilience Social Support  ADL's:  Intact  Cognition:  WNL  Sleep:        Treatment Plan Summary: Daily contact with patient to assess and evaluate symptoms and progress in treatment and Medication management   PLAN: We will start Remeron 15 mg nightly for depression, anxiety, sleep and appetite.  Psychiatry will follow as necessary.  Disposition: No evidence of imminent risk to self or others at  present.   Patient does not meet criteria for psychiatric inpatient admission. Supportive therapy provided about ongoing stressors. Discussed crisis plan, support from social network, calling 911, coming to the Emergency Department, and calling Suicide Hotline.  Orson Slick, MD 07/30/2017 2:38 PM

## 2017-07-30 NOTE — Progress Notes (Signed)
   Name: Cloie Wooden MRN: [id number] DOB: 28-Aug-1955     CONSULTATION DATE: 07/14/2017  Subjective: No BM for the last 3 days  Objectives: Remains on the ventilator, poor progress on vent and trach weaning tolerating tube feeds   PAST MEDICAL HISTORY :   has a past medical history of Emphysema of lung ([place]), GI bleed, and HLD (hyperlipidemia).  has a past surgical history that includes No past surgeries; Oophorectomy (Left); Esophagogastroduodenoscopy (egd) with propofol (N/A, 07/17/2017); and Tracheostomy tube placement (N/A, 07/27/2017). Prior to Admission medications   Medication Sig Start Date End Date Taking? Authorizing Provider  feeding supplement, ENSURE ENLIVE, (ENSURE ENLIVE) LIQD Take 237 mLs by mouth 2 (two) times daily between meals. 06/08/17   Vaughan Basta, MD  ferrous sulfate 325 (65 FE) MG tablet Take 1 tablet (325 mg total) by mouth 2 (two) times daily with a meal. 06/08/17   Vaughan Basta, MD  pantoprazole (PROTONIX) 40 MG tablet Take 1 tablet (40 mg total) by mouth 2 (two) times daily before a meal. 06/08/17   Vaughan Basta, MD  vitamin B-12 (CYANOCOBALAMIN) 1000 MCG tablet Take 1,000 mcg by mouth daily.    [provider]  vitamin C (ASCORBIC ACID) 500 MG tablet Take 500 mg by mouth daily.    [provider]   No Known Allergies  FAMILY HISTORY:  Family history is unknown by patient. SOCIAL HISTORY:  reports that she has never smoked. She has never used smokeless tobacco. She reports that she does not drink alcohol or use drugs.  REVIEW OF SYSTEMS:   Unable to obtain due to critical illness   VITAL SIGNS: Temp:  [98.5 F (36.9 C)-98.9 F (37.2 C)] 98.9 F (37.2 C) (03/31 0800) Pulse Rate:  [88-111] 88 (03/31 0800) Resp:  [14-23] 15 (03/31 0800) BP: (99-127)/(60-98) 115/74 (03/31 0800) SpO2:  [88 %-100 %] 100 % (03/31 0800) FiO2 (%):  [24 %-28 %] 28 % (03/31 0751) Weight:  [61.7 kg (136 lb 0.4 oz)] 61.7 kg (136 lb  0.4 oz) (03/31 0426)  Physical Examination:  Awake and oriented and nonfocal neurological exam Tracheostomy in place, tolerating CPAP/PS,distress, BEAE no rales. Minimal clear secretions S1 + S2 audible and no murmur Benign abdominal exam with feeble peristalsis. Tolerating tube feed No leg edema    ASSESSMENT / PLAN: Acute on chronic respiratory failure.Tracheostomy was placed on 3/28/201 -Vent and trach weaning as tolerated  GI bleeding with history of chronic gastric ulcer -PPI management as per GI.Status post EGD colonoscopy is a consideration if he continues to have GI bleeding as per GI  Atelectasis. improved bibasilar airspace disease -Monitor CXR + CBC + FiO2 and consider starting empiric antimicrobial if develops SIRS.  Dysphagia -As per GI patient was able to swallow her secretions and recommendation to repeat swallow evaluation in the next 48 hours.Feeding tube placement is considered if she continue to have dysphagia  Constipation -Bowel regimen  Full code  DVT+GI prophylaxis. Continue with supportive care.  Critical care time 35 min

## 2017-07-30 NOTE — Progress Notes (Signed)
Pharmacy Electrolyte Monitoring Consult:  Pharmacy consulted to assist in monitoring and replacing electrolytes in this 62 y.o. female admitted on 07/14/2017 with Weakness   Labs:  Sodium (mmol/L)  Date Value  07/29/2017 135   Potassium (mmol/L)  Date Value  07/30/2017 3.8   Magnesium (mg/dL)  Date Value  07/30/2017 1.7   Phosphorus (mg/dL)  Date Value  07/30/2017 3.4   Calcium (mg/dL)  Date Value  07/29/2017 8.4 (L)   Albumin (g/dL)  Date Value  07/26/2017 2.5 (L)    Plan: Pt is s/p tracheostomy placement. She is on tube feeds. K=3.8, mg=1.7 phos= 3.4 this AM. Electrolytes have been stable for the past 3 days. Will plan on rechecking in 2 days, unless there is changes to her tube feeds/diet  Pharmacy to continue to monitor and replace per protocol.  Ramond Dial, Pharm.D, BCPS Clinical Pharmacist  07/30/2017 7:47 AM

## 2017-07-30 NOTE — BH Assessment (Signed)
Writer informed psychiatrist (Dr. Pucilowska) of consult. 

## 2017-07-30 NOTE — Progress Notes (Signed)
Patient alert and able to make needs known via writing. Patient complained of pain of neck/trach site. Medicated with morphine x2 this shift for relief. Complained of nausea x1 and medicated with relief. Patient refused to be repositioned except for keeping her heels elevated this shift. Educated patient multiple times on the importance of repositioning in bed. Patient requested for vent to be kept on full support all day. Will notify oncoming shift. Patient being suctioned for large amounts of thick, pink-tinged secretions. Call bell in reach and safety maintained.

## 2017-07-31 ENCOUNTER — Inpatient Hospital Stay: Payer: Medicaid Other

## 2017-07-31 DIAGNOSIS — J9622 Acute and chronic respiratory failure with hypercapnia: Secondary | ICD-10-CM | POA: Diagnosis not present

## 2017-07-31 DIAGNOSIS — I10 Essential (primary) hypertension: Secondary | ICD-10-CM | POA: Diagnosis present

## 2017-07-31 DIAGNOSIS — E785 Hyperlipidemia, unspecified: Secondary | ICD-10-CM | POA: Diagnosis present

## 2017-07-31 DIAGNOSIS — E876 Hypokalemia: Secondary | ICD-10-CM | POA: Diagnosis present

## 2017-07-31 DIAGNOSIS — D175 Benign lipomatous neoplasm of intra-abdominal organs: Secondary | ICD-10-CM | POA: Diagnosis present

## 2017-07-31 DIAGNOSIS — E43 Unspecified severe protein-calorie malnutrition: Secondary | ICD-10-CM | POA: Diagnosis present

## 2017-07-31 DIAGNOSIS — Z993 Dependence on wheelchair: Secondary | ICD-10-CM | POA: Diagnosis not present

## 2017-07-31 DIAGNOSIS — E538 Deficiency of other specified B group vitamins: Secondary | ICD-10-CM | POA: Diagnosis present

## 2017-07-31 DIAGNOSIS — Z9981 Dependence on supplemental oxygen: Secondary | ICD-10-CM | POA: Diagnosis not present

## 2017-07-31 DIAGNOSIS — J441 Chronic obstructive pulmonary disease with (acute) exacerbation: Secondary | ICD-10-CM | POA: Diagnosis present

## 2017-07-31 DIAGNOSIS — J9601 Acute respiratory failure with hypoxia: Secondary | ICD-10-CM

## 2017-07-31 DIAGNOSIS — K921 Melena: Secondary | ICD-10-CM | POA: Diagnosis present

## 2017-07-31 DIAGNOSIS — E039 Hypothyroidism, unspecified: Secondary | ICD-10-CM | POA: Diagnosis present

## 2017-07-31 DIAGNOSIS — G9341 Metabolic encephalopathy: Secondary | ICD-10-CM | POA: Diagnosis present

## 2017-07-31 DIAGNOSIS — J440 Chronic obstructive pulmonary disease with acute lower respiratory infection: Secondary | ICD-10-CM | POA: Diagnosis present

## 2017-07-31 DIAGNOSIS — E874 Mixed disorder of acid-base balance: Secondary | ICD-10-CM | POA: Diagnosis present

## 2017-07-31 DIAGNOSIS — D62 Acute posthemorrhagic anemia: Secondary | ICD-10-CM | POA: Diagnosis present

## 2017-07-31 DIAGNOSIS — Z7982 Long term (current) use of aspirin: Secondary | ICD-10-CM | POA: Diagnosis not present

## 2017-07-31 DIAGNOSIS — F419 Anxiety disorder, unspecified: Secondary | ICD-10-CM | POA: Diagnosis present

## 2017-07-31 DIAGNOSIS — F322 Major depressive disorder, single episode, severe without psychotic features: Secondary | ICD-10-CM | POA: Diagnosis present

## 2017-07-31 DIAGNOSIS — J189 Pneumonia, unspecified organism: Secondary | ICD-10-CM | POA: Diagnosis present

## 2017-07-31 DIAGNOSIS — E870 Hyperosmolality and hypernatremia: Secondary | ICD-10-CM | POA: Diagnosis present

## 2017-07-31 DIAGNOSIS — J9621 Acute and chronic respiratory failure with hypoxia: Secondary | ICD-10-CM | POA: Diagnosis not present

## 2017-07-31 LAB — GLUCOSE, CAPILLARY
GLUCOSE-CAPILLARY: 101 mg/dL — AB (ref 65–99)
GLUCOSE-CAPILLARY: 115 mg/dL — AB (ref 65–99)
GLUCOSE-CAPILLARY: 122 mg/dL — AB (ref 65–99)
GLUCOSE-CAPILLARY: 91 mg/dL (ref 65–99)
Glucose-Capillary: 122 mg/dL — ABNORMAL HIGH (ref 65–99)

## 2017-07-31 MED ORDER — INSULIN ASPART 100 UNIT/ML ~~LOC~~ SOLN
0.0000 [IU] | Freq: Four times a day (QID) | SUBCUTANEOUS | Status: DC
  Administered 2017-07-31 – 2017-08-05 (×2): 1 [IU] via SUBCUTANEOUS
  Filled 2017-07-31 (×3): qty 1

## 2017-07-31 NOTE — Evaluation (Addendum)
Physical Therapy Evaluation Patient Details Name: Bryony Kaman MRN: [id number] DOB: 12/13/1955 Today's Date: 07/31/2017   History of Present Illness  Kayla Boyer  is a 62 y.o. female with a known history of gastrointestinal bleeding, gastric ulcer, hyperlipidemia, emphysema of lung was referred by ER physician Dr.malinda for weakness and dark stools.  Patient presented to the emergency room with generalized weakness.  She has noticed a dark black stools for the last few days she was recently in our hospital last month and had an endoscopy which showed gastric ulcers she was discharged on oral proton pump inhibitors.  Patient is lethargic and weak.  Has nausea and vomitings.  No complaints of any chest pain, abdominal pain.  No fever and chills.Hemoglobin dropped from 11.7 to 9.7. Stool for occult blood is positive.  Diagnosis of GI bleed and then developed acute resp distress and placed on BIPAP and then required a ventilator. She is currently vent dependent and did not tolerate weaning over the weekend to a lower setting. NG tube in place and NPO.   Clinical Impression  Pt is a pleasant 62 year old female who was admitted for respiratory failure and GI bleed. Pt is having difficulty weaning off vent. Non verbal, although alert and can write answers to questions. Pt performs bed mobility with max assist. Pt demonstrates deficits with global weakness/endurance/mobility. Does appear to have L hemibody weakness compared to R side. Appears motivated to participate, however is limited at baseline  Per patient report, she has not been ambulatory since last summer and does not get out of bed. Would benefit from skilled PT to address above deficits and promote bed mobility and decreased risk of skin break down. Will keep as trial on PT caseload to determine if progress is made.      Follow Up Recommendations [facility]    Equipment Recommendations  (TBD- possible hoyer lift)    Recommendations for Other  Services       Precautions / Restrictions Precautions Precautions: Fall Restrictions Weight Bearing Restrictions: No      Mobility  Bed Mobility Overal bed mobility: Needs Assistance Bed Mobility: Rolling Rolling: Max assist         General bed mobility comments: able to reach with R arm assisting L arm to bed rail to attempt rolling x 3 times, however complete disassociation between trunk and lower half of body. Unable to fully roll to side. Of note, pt on vent, performed rolling towards R side of bed closest to vent.  Transfers                 General transfer comment: unable to perform  Ambulation/Gait                Stairs            Wheelchair Mobility    Modified Rankin (Stroke Patients Only)       Balance                                             Pertinent Vitals/Pain Pain Assessment: No/denies pain    Home Living Family/patient expects to be discharged to:: Private residence Living Arrangements: Spouse/significant other;Children Available Help at Discharge: Family;Available 24 hours/day Type of Home: House Home Access: Ramped entrance     Home Layout: One level Home Equipment: Walker - 2 wheels;Bedside commode;Grab bars - tub/shower;Shower seat;Wheelchair -  manual      Prior Function Level of Independence: Needs assistance   Gait / Transfers Assistance Needed: Pt has been non-ambulatory for > one year per patient and has been bed bound for the last 6 months with spouse assisting with bed pan.  ADL's / Homemaking Assistance Needed: Husband assists with ADLs.  Comments: Pt ambulated with RW previously due to RLE weakness (she fractured her R ankle 22yrs ago s/p a fall), which has progressively gotten worse in last few months.      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 Reports no sensation issues. L foot unable to achieve neutral position.       Communication   Communication: HOH  Cognition Arousal/Alertness: Awake/alert Behavior During Therapy: WFL for tasks assessed/performed Overall Cognitive Status: Within Functional Limits for tasks assessed                                 General Comments: Pt unable to speak but can write answers on paper on clipboard with small marker.  May benefit from a larger writing utensil due to hx of RA.      General Comments      Exercises Other Exercises Other Exercises: supine ther-ex performed on B LE including R LE ankle pumps, SLRs, heel slides, and hip abd/add. L PROM ankle pumps, AAROM- heel slides and hip abd/add. Needs heavy assist for all ther-ex x 10 reps.   Assessment/Plan    PT Assessment Patient needs continued PT services  PT Problem List Decreased strength;Decreased range of motion;Decreased activity tolerance;Decreased balance;Decreased mobility;Cardiopulmonary status limiting activity       PT Treatment Interventions Therapeutic exercise;Therapeutic activities;Balance training;Wheelchair mobility training    PT Goals (Current goals can be found in the Care Plan section)  Acute Rehab PT Goals Patient Stated Goal: "to get stronger and take care of myself" PT Goal Formulation: With patient Time For Goal Achievement: 08/14/17 Potential to Achieve Goals: Good    Frequency Min 2X/week   Barriers to discharge        Co-evaluation                AM-PAC PT "6 Clicks" Daily Activity  Outcome Measure Difficulty turning over in bed (including adjusting bedclothes, sheets and blankets)?: Unable Difficulty moving from lying on back to sitting on the side of the bed? : Unable Difficulty sitting down on and standing up from a chair with arms (e.g., wheelchair, bedside commode, etc,.)?: Unable Help needed moving to and from a bed to chair (including a wheelchair)?: Total Help needed walking in hospital room?: Total Help needed climbing 3-5 steps with a railing? : Total 6 Click Score: 6    End of Session Equipment Utilized During Treatment: (vent) Activity Tolerance: Treatment limited secondary to medical complications (Comment) Patient left: in bed;with bed alarm set Nurse Communication: Mobility status PT Visit Diagnosis: Muscle weakness (generalized) (M62.81);Difficulty in walking, not elsewhere classified (  R26.2)    Time: 5945-8592 PT Time Calculation (min) (ACUTE ONLY): 25 min   Charges:   PT Evaluation $PT Eval High Complexity: 1 High PT Treatments $Therapeutic Exercise: 8-22 mins   PT G Codes:        Greggory Stallion, PT, DPT [phone]   Sharlett Lienemann 07/31/2017, 5:16 PM

## 2017-07-31 NOTE — Progress Notes (Signed)
[place] at [facility]                                                                                                                                                                                  Patient Demographics   Kayla Boyer, is a 62 y.o. female, DOB - 09-28-1955, TKZ:[id number]  Admit date - 07/14/2017   Admitting Physician Saundra Shelling, MD  Outpatient Primary MD for the patient is Patient, [name]   LOS - 34  Subjectiveshe is s/p tracheostomy with ventilator support PEEP 5 PS 15 FIO2 24 % Awake and responds to verbal commands  Review of Systems:  On ventilator support via tracheostomy Opens eyes and communicate some verbal commands Complete review of systems not possible  Vitals:   Vitals:   07/31/17 1200 07/31/17 1203 07/31/17 1300 07/31/17 1400  BP: 114/77  107/65 117/77  Pulse: 89  96 98  Resp: 18  16 15   Temp: 98.5 F (36.9 C)     TempSrc: Oral     SpO2: 100% 100% 100% 100%  Weight: 58.4 kg (128 lb 12 oz)     Height:        Wt Readings from Last 3 Encounters:  07/31/17 58.4 kg (128 lb 12 oz)  07/05/17 60.8 kg (134 lb)  07/04/17 60.8 kg (134 lb)     Intake/Output Summary (Last 24 hours) at 07/31/2017 1425 Last data filed at 07/31/2017 1400 Gross per 24 hour  Intake 2000 ml  Output 1950 ml  Net 50 ml    Physical Exam:   GENERAL: 62 year old female patient lying on the bed in the stepdown unit on BiPAP HEAD, EYES, EARS, NOSE AND THROAT: Atraumatic, normocephalic. Extraocular muscles are intact. Pupils equal and reactive to light. Sclerae anicteric. No conjunctival injection. No oro-pharyngeal erythema.  NECK: Supple. There is no jugular venous distention. No bruits, no lymphadenopathy, no thyromegaly.  Patient has trach in the neck.   HEART: Regular rate and rhythm,. No murmurs, no rubs, no clicks.  LUNGS: Clear to auscultation bilaterally. No rales or rhonchi. No wheezes.  On ventilator support via  tracheostomy ABDOMEN: Soft, flat, nontender, nondistended. Has good bowel sounds. No hepatosplenomegaly appreciated.  EXTREMITIES: No evidence of any cyanosis, clubbing.  +2 pedal and radial pulses bilaterally.  1+ pedal edema NEUROLOGIC: The patient is alert, arousable, and oriented x2 with   Decreased hearing, left arm weakness improved Right pupil greater than left pupil SKIN: Moist and warm with no rashes appreciated.  Psych: Not be assessed LN: No inguinal LN enlargement    Antibiotics   Anti-infectives (From admission, onward)   Start     Dose/Rate Route  Frequency Ordered Stop   07/14/17 2200  azithromycin (ZITHROMAX) 500 mg in sodium chloride 0.9 % 250 mL IVPB  Status:  Discontinued     500 mg 250 mL/hr over 60 Minutes Intravenous Every 24 hours 07/14/17 2122 07/18/17 0915      Medications   Scheduled Meds: . chlorhexidine  15 mL Mouth Rinse BID  . sennosides  5 mL Per Tube BID   And  . docusate  50 mg Per Tube BID  . free water  200 mL Per Tube Q6H  . insulin aspart  0-9 Units Subcutaneous Q6H  . ipratropium-albuterol  3 mL Nebulization Q4H  . liver oil-zinc oxide   Topical Daily  . mouth rinse  15 mL Mouth Rinse q12n4p  . mirtazapine  15 mg Oral QHS  . multivitamin  15 mL Per Tube Daily  . pantoprazole sodium  40 mg Per Tube BID  . sodium chloride flush  10-40 mL Intracatheter Q12H  . thiamine  100 mg Per Tube Daily  . thyroid  30 mg Per Tube QAC breakfast   Continuous Infusions: . dextrose 25 mL/hr (07/30/17 0428)  . feeding supplement (VITAL HIGH PROTEIN) 1,000 mL (07/30/17 0428)   PRN Meds:.acetaminophen (TYLENOL) oral liquid 160 mg/5 mL, acetaminophen **OR** acetaminophen, ALPRAZolam, bisacodyl, fentaNYL (SUBLIMAZE) injection, metoprolol tartrate, morphine injection, [DISCONTINUED] ondansetron **OR** ondansetron (ZOFRAN) IV, ondansetron (ZOFRAN) IV, simethicone, sodium chloride flush   Data Review:   Micro Results No results found for this or any  previous visit (from the past 240 hour(s)).  Radiology Reports Dg Abd 1 View  Result Date: 07/31/2017 CLINICAL DATA:  Nasogastric tube placement. EXAM: ABDOMEN - 1 VIEW COMPARISON:  Radiograph July 22, 2017. FINDINGS: The bowel gas pattern is normal. Distal tip of feeding tube is seen in distal stomach. No radio-opaque calculi or other significant radiographic abnormality are seen. IMPRESSION: Distal tip of nasogastric tube seen in distal stomach. No evidence of bowel obstruction or ileus. Electronically Signed   By: Marijo Conception, M.D.   On: 07/31/2017 11:47   Dg Abd 1 View  Result Date: 07/22/2017 CLINICAL DATA:  Encounter for feeding tube placement. EXAM: ABDOMEN - 1 VIEW COMPARISON:  Chest radiograph 07/22/2017 FINDINGS: A nasogastric tube has been advanced into the abdomen. Catheter tip is in the distal stomach region. Bowel gas throughout the abdomen. Persistent densities at the left lung base and the left hemidiaphragm remains obscured. IMPRESSION: Feeding tube tip in the distal stomach region. Electronically Signed   By: Markus Daft M.D.   On: 07/22/2017 16:30   Ct Head Wo Contrast  Result Date: 07/23/2017 CLINICAL DATA:  62 y/o F; changes to left pupil in strength of left arm. EXAM: CT HEAD WITHOUT CONTRAST TECHNIQUE: Contiguous axial images were obtained from the base of the skull through the vertex without intravenous contrast. COMPARISON:  None. FINDINGS: Brain: No evidence of acute infarction, hemorrhage, hydrocephalus, extra-axial collection or mass lesion/mass effect. Vascular: Calcific atherosclerosis of carotid siphons. No hyperdense vessel. Skull: Normal. Negative for fracture or focal lesion. Sinuses/Orbits: No acute finding. Other: None. IMPRESSION: Negative CT of the head. Electronically Signed   By: Kristine Garbe M.D.   On: 07/23/2017 14:33   Nm Gi Blood Loss  Result Date: 07/20/2017 CLINICAL DATA:  GI bleeding EXAM: NUCLEAR MEDICINE GASTROINTESTINAL BLEEDING SCAN  TECHNIQUE: Sequential abdominal images were obtained following intravenous administration of Tc-54m labeled red blood cells. RADIOPHARMACEUTICALS:  Twenty mCi Tc-60m pertechnetate in-vitro labeled red cells. COMPARISON:  None. FINDINGS: Normal distribution of  radiotracer is noted. No focal area of tracer accumulation is identified to suggest active GI bleeding is noted. IMPRESSION: No findings to suggest active GI hemorrhage. Electronically Signed   By: Inez Catalina M.D.   On: 07/20/2017 15:16   US Venous Img Upper Bilat  Result Date: 07/16/2017 CLINICAL DATA:  Bilateral upper extremity edema acutely with pain and discomfort for 1 day EXAM: BILATERAL UPPER EXTREMITY VENOUS DOPPLER ULTRASOUND TECHNIQUE: Gray-scale sonography with graded compression, as well as color Doppler and duplex ultrasound were performed to evaluate the bilateral upper extremity deep venous systems from the level of the subclavian vein and including the jugular, axillary, basilic, radial, ulnar and upper cephalic vein. Spectral Doppler was utilized to evaluate flow at rest and with distal augmentation maneuvers. COMPARISON:  None. FINDINGS: Exam is limited because of upper extremity subcutaneous edema. RIGHT UPPER EXTREMITY Internal Jugular Vein: No evidence of thrombus. Normal compressibility, respiratory phasicity and response to augmentation. Subclavian Vein: No evidence of thrombus. Normal compressibility, respiratory phasicity and response to augmentation. Axillary Vein: No evidence of thrombus. Normal compressibility, respiratory phasicity and response to augmentation. Cephalic Vein: No evidence of thrombus. Normal compressibility, respiratory phasicity and response to augmentation. Basilic Vein: Not well visualized to accurately assess. Brachial Veins: No evidence of thrombus. Normal compressibility, respiratory phasicity and response to augmentation. Radial Veins: No evidence of thrombus. Normal compressibility, respiratory  phasicity and response to augmentation. Ulnar Veins: No evidence of thrombus. Normal compressibility, respiratory phasicity and response to augmentation. Venous Reflux:  None. Other Findings:  Peripheral edema noted LEFT UPPER EXTREMITY Internal Jugular Vein: No evidence of thrombus. Normal compressibility, respiratory phasicity and response to augmentation. Subclavian Vein: No evidence of thrombus. Normal compressibility, respiratory phasicity and response to augmentation. Axillary Vein: No evidence of thrombus. Normal compressibility, respiratory phasicity and response to augmentation. Cephalic Vein: No evidence of thrombus. Normal compressibility, respiratory phasicity and response to augmentation. Basilic Vein: No evidence of thrombus. Normal compressibility, respiratory phasicity and response to augmentation. Brachial Veins: No evidence of thrombus. Normal compressibility, respiratory phasicity and response to augmentation. Radial Veins: No evidence of thrombus. Normal compressibility, respiratory phasicity and response to augmentation. Ulnar Veins: No evidence of thrombus. Normal compressibility, respiratory phasicity and response to augmentation. Venous Reflux:  None. Other Findings:  But peripheral subcutaneous edema noted. IMPRESSION: Limited exam but no gross occlusive upper extremity DVT in either arm. Electronically Signed   By: Jerilynn Mages.  Shick M.D.   On: 07/16/2017 12:34   US Venous Img Upper Uni Left  Result Date: 07/25/2017 CLINICAL DATA:  Left upper extremity edema. EXAM: LEFT UPPER EXTREMITY VENOUS DOPPLER ULTRASOUND TECHNIQUE: Gray-scale sonography with graded compression, as well as color Doppler and duplex ultrasound were performed to evaluate the upper extremity deep venous system from the level of the subclavian vein and including the jugular, axillary, basilic, radial, ulnar and upper cephalic vein. Spectral Doppler was utilized to evaluate flow at rest and with distal augmentation maneuvers.  COMPARISON:  None. FINDINGS: Contralateral Subclavian Vein: Respiratory phasicity is normal and symmetric with the symptomatic side. No evidence of thrombus. Normal compressibility. Internal Jugular Vein: No evidence of thrombus. Normal compressibility, respiratory phasicity and response to augmentation. Subclavian Vein: No evidence of thrombus. Normal compressibility, respiratory phasicity and response to augmentation. Axillary Vein: No evidence of thrombus. Normal compressibility, respiratory phasicity and response to augmentation. Cephalic Vein: No evidence of thrombus. Normal compressibility, respiratory phasicity and response to augmentation. Basilic Vein: No evidence of thrombus. Normal compressibility, respiratory phasicity and response to augmentation. Brachial Veins:  No evidence of thrombus. Normal compressibility, respiratory phasicity and response to augmentation. Radial Veins: No evidence of thrombus. Normal compressibility, respiratory phasicity and response to augmentation. Ulnar Veins: No evidence of thrombus. Normal compressibility, respiratory phasicity and response to augmentation. Venous Reflux:  None visualized. Other Findings: There is significant edema noted in the left upper arm including some more locally marginated fluid in the subcutaneous tissues of the left medial upper arm. This fluid appears to extend over a long distance and likely represents subcutaneous fluid which is more prominent in 1 area of the upper arm. If there is any concern for soft tissue infection, additional imaging may be warranted. IMPRESSION: 1. No evidence of left upper extremity deep venous thrombosis. 2. Significant subcutaneous fluid in the left upper arm with an area of more marginated subcutaneous fluid also identified in the medial left upper arm. Although this does not have the appearance of a focal abscess by ultrasound, if there is concern for upper extremity infection, additional imaging may be warranted  with CT and/or MRI. Electronically Signed   By: Aletta Edouard M.D.   On: 07/25/2017 17:48   Dg Chest Port 1 View  Result Date: 07/30/2017 CLINICAL DATA:  Pneumonia EXAM: PORTABLE CHEST 1 VIEW COMPARISON:  07/29/2017 and prior radiographs FINDINGS: A tracheostomy tube, RIGHT PICC line with tip overlying the mid-LOWER SVC and endotracheal tube with tip overlying the distal esophagus again noted. Decreased LEFT LOWER lung consolidation/atelectasis noted. Mild RIGHT basilar atelectasis again noted. There is no evidence of pneumothorax. No other interval changes identified. IMPRESSION: 1. Decreased LEFT LOWER lung consolidation/atelectasis. 2. NG tube with tip overlying the distal esophagus-recommend advancement. Electronically Signed   By: Margarette Canada M.D.   On: 07/30/2017 07:49   Dg Chest Port 1 View  Result Date: 07/29/2017 CLINICAL DATA:  Acute on chronic respiratory failure. EXAM: PORTABLE CHEST 1 VIEW COMPARISON:  Radiograph July 27, 2017. FINDINGS: Stable cardiomegaly. Tracheostomy is in grossly good position. Distal tip of nasogastric tube is seen in distal esophagus. No pneumothorax is noted. Right-sided PICC line is noted with distal tip in expected position of SVC. Mild bibasilar subsegmental atelectasis is noted. Bony thorax is unremarkable. IMPRESSION: Mild bibasilar subsegmental atelectasis. Endotracheal tube in grossly good position. Distal tip of nasogastric tube has withdrawn into distal esophagus. Electronically Signed   By: Marijo Conception, M.D.   On: 07/29/2017 22:01   Dg Chest Port 1 View  Result Date: 07/27/2017 CLINICAL DATA:  Excessive fluid EXAM: PORTABLE CHEST 1 VIEW COMPARISON:  Chest x-ray of July 23, 2017 FINDINGS: The lungs are well-expanded. There is a trace of pleural fluid on the right and slightly more on the left. The retrocardiac region remains dense. The cardiac silhouette is enlarged. The central pulmonary vascularity is prominent. The mediastinum is normal in width.  The trachea is midline. The right-sided PICC line tip projects over the midportion of the SVC. The tracheostomy tube tip projects at the superior margin of the clavicular heads. The bony thorax exhibits no acute abnormality. IMPRESSION: Slight improved aeration of both lungs today. Small amounts of pleural fluid at both bases. Persistent atelectasis or pneumonia in the left lower lobe. Top-normal cardiac size with central pulmonary vascular congestion but no definite pulmonary edema. Electronically Signed   By: David  Martinique M.D.   On: 07/27/2017 10:55   Dg Chest Port 1 View  Result Date: 07/23/2017 CLINICAL DATA:  Acute on chronic respiratory failure. Airway aspiration. EXAM: PORTABLE CHEST 1 VIEW COMPARISON:  07/22/2017. FINDINGS:  Interval enlargement of the cardiac silhouette. Stable dense left lower lobe airspace opacity. Interval increased linear and patchy density at the right lung base. Stable small bilateral pleural effusions. Nasogastric tube extending into the stomach. Thoracic spine degenerative changes. IMPRESSION: 1. No significant change in dense left lower lobe atelectasis or pneumonia and small left pleural effusion. 2. Increased right basilar atelectasis or pneumonia with a stable small right pleural effusion. Electronically Signed   By: Claudie Revering M.D.   On: 07/23/2017 16:09   Dg Chest Port 1 View  Result Date: 07/22/2017 CLINICAL DATA:  Respiratory failure.  COPD. EXAM: PORTABLE CHEST 1 VIEW COMPARISON:  07/19/2017. FINDINGS: The cardiac silhouette remains borderline enlarged. Stable left pleural effusion and left basilar airspace opacity. Small right pleural effusion, decreased, with decreased adjacent right basilar atelectasis. Thoracic spine degenerative changes. IMPRESSION: 1. Stable left pleural effusion and left lower lobe atelectasis or pneumonia. 2. Decreased right pleural fluid and right basilar atelectasis. Electronically Signed   By: Claudie Revering M.D.   On: 07/22/2017 09:14    Dg Chest Port 1 View  Result Date: 07/19/2017 CLINICAL DATA:  Respiratory failure EXAM: PORTABLE CHEST 1 VIEW COMPARISON:  July 15, 2017 FINDINGS: There are small pleural effusions bilaterally. There is consolidation in the left lower lobe. Lungs elsewhere are clear. Heart size is upper normal with pulmonary vascularity within normal limits. No adenopathy. There is degenerative change in the thoracic spine. IMPRESSION: Left lower lobe consolidation with small pleural effusions bilaterally. There is mild bibasilar atelectasis. Stable cardiac silhouette. Electronically Signed   By: Lowella Grip III M.D.   On: 07/19/2017 07:57   Dg Chest Port 1 View  Result Date: 07/15/2017 CLINICAL DATA:  Acute respiratory failure. EXAM: PORTABLE CHEST 1 VIEW COMPARISON:  Chest x-ray from yesterday FINDINGS: The heart size and mediastinal contours are within normal limits. Normal pulmonary vascularity. Unchanged left greater than right bibasilar atelectasis and scarring at the costophrenic angles. No focal consolidation, pleural effusion, or pneumothorax. No acute osseous abnormality. IMPRESSION: Stable bibasilar atelectasis/scarring. Electronically Signed   By: Titus Dubin M.D.   On: 07/15/2017 10:41   Dg Chest Portable 1 View  Result Date: 07/14/2017 CLINICAL DATA:  Shortness of breath. EXAM: PORTABLE CHEST 1 VIEW COMPARISON:  06/05/2017. FINDINGS: Mediastinum and hilar structures normal. Stable mild cardiomegaly with normal pulmonary vascularity. Low lung volumes with mild basilar atelectasis. Stable bilateral pleural thickening consistent scarring. Degenerative changes thoracic spine. IMPRESSION: Low lung volumes with mild basilar atelectasis and/or pleuroparenchymal scarring again noted. Stable mild cardiomegaly. Chest is stable from prior exam. Electronically Signed   By: Marcello Moores  Register   On: 07/14/2017 17:13   Ct Renal Stone Study  Result Date: 07/04/2017 CLINICAL DATA:  62 year old female with  blood on pad. Question rectal bleeding, vaginal bleeding or blood in urine. Initial encounter. EXAM: CT ABDOMEN AND PELVIS WITHOUT CONTRAST TECHNIQUE: Multidetector CT imaging of the abdomen and pelvis was performed following the standard protocol without IV contrast. COMPARISON:  06/05/2017 CT. FINDINGS: Lower chest: Bibasilar atelectasis greater left once again noted. Hepatobiliary: Multiple gallstones. No CT evidence of surrounding inflammation. If patient had right upper quadrant tenderness, ultrasound can be obtained for further delineation. Taking into account limitation by non contrast imaging, no worrisome liver lesion. Pancreas: Taking into account limitation by non contrast imaging, no worrisome pancreatic lesion or inflammation. Spleen: Taking into account limitation by non contrast imaging, no worrisome splenic lesion or enlargement. Adrenals/Urinary Tract: When compared to the recent CT, there is mild fullness of  the right renal collecting system without obstructing stone identified. This may be related to recently passed stone. Bilateral tiny/small nonobstructing renal calculi are noted (largest left lower pole measures 3.5 mm). Taking into account limitation by non contrast imaging, no worrisome renal or adrenal lesion. Noncontrast filled views of the urinary bladder unremarkable. Stomach/Bowel: Moderate stool rectosigmoid region. Superior extension sigmoid colon. No extraluminal bowel inflammatory process noted. Under distended stomach without gross abnormality. Tiny lipoma third portion of the duodenum. Vascular/Lymphatic: Aortic and iliac artery calcification. No aneurysm. No adenopathy. Reproductive: Retroverted uterus.  No adnexal mass. Other: No free intraperitoneal air or bowel containing hernia. Musculoskeletal: No worrisome osseous abnormality. IMPRESSION: When compared to recent CT, there is mild fullness of the right renal collecting system without obstructing stone identified. This may be  related to recently passed stone or blood clot. Bilateral tiny/small nonobstructing renal calculi. Multiple gallstones. Bibasilar atelectasis greater left once again noted. Moderate stool rectosigmoid region. Superior extension sigmoid colon. No extraluminal bowel inflammatory process noted. Tiny lipoma third portion of the duodenum. Aortic Atherosclerosis (ICD10-I70.0). Electronically Signed   By: Genia Del M.D.   On: 07/04/2017 08:03   [place] Ekg Site Rite  Result Date: 07/25/2017 If Site Rite image not attached, placement could not be confirmed due to current cardiac rhythm.    CBC Recent Labs  Lab 07/25/17 0743 07/26/17 0502 07/30/17 0459  WBC 11.5* 9.3 12.8*  HGB 9.8* 8.7* 7.5*  HCT 31.2* 26.0* 23.2*  PLT 373 361 353  MCV 92.9 91.3 90.9  MCH 29.1 30.4 29.2  MCHC 31.3* 33.3 32.1  RDW 17.3* 17.4* 17.2*  LYMPHSABS 0.5* 0.3* 0.7*  MONOABS 0.8 0.9 0.7  EOSABS 0.3 0.3 0.2  BASOSABS 0.0 0.0 0.0    Chemistries  Recent Labs  Lab 07/25/17 0743  07/26/17 0502 07/27/17 0401 07/28/17 0519 07/29/17 0808 07/30/17 0459  NA 139  --  141 138 135 135  --   K 2.8*   < > 4.4 3.9 3.9 3.6 3.8  CL 89*  --  95* 92* 92* 94*  --   CO2 40*  --  39* 38* 33* 32  --   GLUCOSE 119*  --  127* 112* 138* 120*  --   BUN 12  --  9 12 14 15   --   CREATININE <0.30*  --  <0.30* <0.30* <0.30* <0.30*  --   CALCIUM 8.4*  --  8.5* 8.8* 8.8* 8.4*  --   MG 1.9  --  1.7  --  1.6* 1.8 1.7   < > = values in this interval not displayed.   ------------------------------------------------------------------------------------------------------------------ CrCl cannot be calculated (This lab value cannot be used to calculate CrCl because it is not a number: <0.30). ------------------------------------------------------------------------------------------------------------------ No results for input(s): HGBA1C in the last 72  hours. ------------------------------------------------------------------------------------------------------------------ No results for input(s): CHOL, HDL, LDLCALC, TRIG, CHOLHDL, LDLDIRECT in the last 72 hours. ------------------------------------------------------------------------------------------------------------------ No results for input(s): TSH, T4TOTAL, T3FREE, THYROIDAB in the last 72 hours.  Invalid input(s): FREET3 ------------------------------------------------------------------------------------------------------------------ No results for input(s): VITAMINB12, FOLATE, FERRITIN, TIBC, IRON, RETICCTPCT in the last 72 hours.  Coagulation profile Recent Labs  Lab 07/25/17 0743  INR 0.93    No results for input(s): DDIMER in the last 72 hours.  Cardiac Enzymes No results for input(s): CKMB, TROPONINI, MYOGLOBIN in the last 168 hours.  Invalid input(s): CK ------------------------------------------------------------------------------------------------------------------ Invalid input(s): POCBNP    Assessment & Plan   62 year old female patient with history of chronic respiratory failure, GI bleed currently in the  hospital for acute on chronic hypercapnic respiratory failure and anemia secondary to slow GI bleed.  1.Acute on chronic hypercapnic respiratory failure S/p tracheostomy on ventilator support Impaired ventilatory drive and global muscle weakness Appreciate intensivist management.  2. Moderate malnutrition  Feeding supplements  3.  Moderate malnutrition  Continue NG tube feeding, appreciate GI following.,waiting for swallow eval 4. Electrolyte imbalance F/u magnesium and potassium  5.  Acute on chronic gastrointestinal bleeding s/p EGD 3/18 showing Normal duodenal bulb and second portion of the duodenum.Duodenal lipoma. Non-bleeding gastric ulcer with a clean ulcer base (Forrest Class III).Normal gastroesophageal junction and esophagus. - protonix  40 mg IV bid - 6. DVT prophylaxis with SCD  7. Hypomagnesemia : Replace magnesium  8.  Management follow-up for possible LTAC placement          Code Status Orders  (From admission, onward)        Start     Ordered   07/14/17 2115  Full code  Continuous     07/14/17 2122    Code Status History    Date Active Date Inactive Code Status Order ID Comments User Context   06/05/2017 1654 06/08/2017 1820 Full Code [id number]  Bettey Costa, MD Inpatient   11/01/2016 0121 11/09/2016 2044 Full Code [id number]  Lance Coon, MD Inpatient       Time Spent in minutes   35 minutes  Greater than 50% of time spent in care coordination and counseling patient regarding the condition and plan of care.   Epifanio Lesches M.D on 07/31/2017 at 2:25 PM  Between 7am to 6pm - Pager - [phone]  After 6pm go to [email or web address] - Proofreader  [employer]   Office  [phone]

## 2017-07-31 NOTE — Progress Notes (Signed)
   Name: Kayla Boyer MRN: [id number] DOB: 1955-06-29     CONSULTATION DATE: 07/14/2017  Subjective: No major issues last night  Objectives: Remains on vent with poor progress on weaning trials.   HISTORY OF PRESENT ILLNESS:    PAST MEDICAL HISTORY :   has a past medical history of Emphysema of lung ([place]), GI bleed, and HLD (hyperlipidemia).  has a past surgical history that includes No past surgeries; Oophorectomy (Left); Esophagogastroduodenoscopy (egd) with propofol (N/A, 07/17/2017); and Tracheostomy tube placement (N/A, 07/27/2017). Prior to Admission medications   Medication Sig Start Date End Date Taking? Authorizing Provider  feeding supplement, ENSURE ENLIVE, (ENSURE ENLIVE) LIQD Take 237 mLs by mouth 2 (two) times daily between meals. 06/08/17   Vaughan Basta, MD  ferrous sulfate 325 (65 FE) MG tablet Take 1 tablet (325 mg total) by mouth 2 (two) times daily with a meal. 06/08/17   Vaughan Basta, MD  pantoprazole (PROTONIX) 40 MG tablet Take 1 tablet (40 mg total) by mouth 2 (two) times daily before a meal. 06/08/17   Vaughan Basta, MD  vitamin B-12 (CYANOCOBALAMIN) 1000 MCG tablet Take 1,000 mcg by mouth daily.    [provider]  vitamin C (ASCORBIC ACID) 500 MG tablet Take 500 mg by mouth daily.    [provider]   No Known Allergies  FAMILY HISTORY:  Family history is unknown by patient. SOCIAL HISTORY:  reports that she has never smoked. She has never used smokeless tobacco. She reports that she does not drink alcohol or use drugs.  REVIEW OF SYSTEMS:   Unable to obtain due to critical illness   VITAL SIGNS: Temp:  [98.5 F (36.9 C)-99 F (37.2 C)] 99 F (37.2 C) (04/01 1633) Pulse Rate:  [89-121] 95 (04/01 1700) Resp:  [13-24] 13 (04/01 1700) BP: (94-136)/(59-109) 112/66 (04/01 1700) SpO2:  [97 %-100 %] 99 % (04/01 1700) FiO2 (%):  [28 %] 28 % (04/01 1453) Weight:  [58.4 kg (128 lb 12 oz)] 58.4 kg (128 lb 12 oz)  (04/01 1200)  Physical Examination:  Awake and oriented and nonfocal neurological exam Tracheostomy in place, toleratingCPAP/PS,distress, BEAE no rales. Minimal clear secretions S1 + S2 audible and no murmur Benign abdominal exam with feeble peristalsis. Tolerating tube feed No leg edema  ASSESSMENT / PLAN: Acute on chronic respiratory failure.Tracheostomy was placed on 3/28/201 -Vent and trach weaning as tolerated  GI bleeding with history of chronic gastric ulcer -PPI management as per GI.Status post EGD, colonoscopy is a consideration if he continues to have GI bleeding as per GI  Atelectasis. improved bibasilar airspace disease -Monitor CXR + CBC + FiO2 and consider starting empiric antimicrobial if develops SIRS.  Dysphagia -As per GI patient was able to swallow her secretions and recommendation to repeat swallow evaluation in the next 48 hours. -ConsiderFeeding tube placement if continue to have dysphagia or unable to wean of vent by next week.  Constipation -Bowel regimen  Depression -Remeron as per psych.  Full code  DVT+GI prophylaxis. Continue with supportive care.  Critical care time 35 min

## 2017-07-31 NOTE — Progress Notes (Signed)
RN called Dr. Soyla Murphy and made MD aware of abdominal xray results after NG tube advancement and MD gave order that it is okay to use NG tube.

## 2017-07-31 NOTE — Evaluation (Signed)
Occupational Therapy Evaluation Patient Details Name: Kayla Boyer MRN: [id number] DOB: March 02, 1956 Today's Date: 07/31/2017    History of Present Illness Kayla Boyer  is a 62 y.o. female with a known history of gastrointestinal bleeding, gastric ulcer, hyperlipidemia, emphysema of lung was referred by ER physician Dr.malinda for weakness and dark stools.  Patient presented to the emergency room with generalized weakness.  She has noticed a dark black stools for the last few days she was recently in our hospital last month and had an endoscopy which showed gastric ulcers she was discharged on oral proton pump inhibitors.  Patient is lethargic and weak.  Has nausea and vomitings.  No complaints of any chest pain, abdominal pain.  No fever and chills.Hemoglobin dropped from 11.7 to 9.7. Stool for occult blood is positive.  Diagnosis of GI bleed and then developed acute resp distress and placed on BIPAP and then required a ventilator. She is currently vent dependent and did not tolerate weaning over the weekend to a lower setting. NG tube in place and NPO.    Clinical Impression   Pt is 62 year old who lives at home with her husband and has been bed bound since July.  Her sister Daine Floras was present to help with history and level of assist.  Pt is on ventilator support with trache in place but needs full vent support.  She is using a small pen to write on paper and clipboard for short sentences.  Spoke to [facility] therapist Orinda Kenner about giving pt a communication board to assist with communication since she has RA and poor hand strength making writing legibility poor at times.  Pt is able to use BUEs and hands for gross grasp to complete basic hygiene but fatigues quickly.  She would benefit from theraputty to increase B hand strength as well as ADL training, energy conservation techniques, balance training, bed mobility, endurance training and recommendations for home modifications to increase safety and  prevent falls.  Pt is very deconditioned and rec LTACH at this time.      Follow Up Recommendations  LTACH    Equipment Recommendations       Recommendations for Other Services       Precautions / Restrictions Precautions Precautions: (P) Fall Restrictions Weight Bearing Restrictions: (P) No      Mobility Bed Mobility Overal bed mobility: (P) Needs Assistance Bed Mobility: (P) Rolling Rolling: (P) Max assist         General bed mobility comments: (P) able to reach with R arm assisting L arm to bed rail to attempt rolling x 3 times, however complete disassociation between trunk and lower half of body. Unable to fully roll to side. Of note, pt on vent, performed rolling towards R side of bed closest to vent.  Transfers                 General transfer comment: (P) unable to perform    Balance                                           ADL either performed or assessed with clinical judgement   ADL Overall ADL's : Needs assistance/impaired Eating/Feeding: NPO Eating/Feeding Details (indicate cue type and reason): Pt may have a MBSS to assess swallow once she is more stable resp wise and able to get off of vent to trache collar only per  NSG report. Grooming: Wash/dry hands;Wash/dry face;Oral care;Set up;Minimal assistance;Sitting Grooming Details (indicate cue type and reason): pt leans head forward to hands which is limited due to trache collar in place         Upper Body Dressing : Moderate assistance;Set up   Lower Body Dressing: Maximal assistance;Set up;+2 for physical assistance;Bed level Lower Body Dressing Details (indicate cue type and reason): Pt was completing LB dressing and toileting needs in bed and has been bed bound since July per her sister Susie's report  Toilet Transfer: Total assistance                   Vision Patient Visual Report: No change from baseline       Perception     Praxis      Pertinent  Vitals/Pain Pain Assessment: (P) No/denies pain     Hand Dominance (P) Right   Extremity/Trunk Assessment Upper Extremity Assessment Upper Extremity Assessment: (P) Defer to OT evaluation RUE Deficits / Details: 5th digit contracture with limited extension and partial flexion; poor strength and low muslce tone; AROM to about 80 degrees for shoulder flexion LUE Deficits / Details: poor strength and low muslce tone; AROM to about 80 degrees for shoulder flexion   Lower Extremity Assessment Lower Extremity Assessment: (P) Generalized weakness;LLE deficits/detail(R LE grossly 2/5) LLE Deficits / Details: (P) L dorsiflexion 0/5; knee flex/extension 1/5. Reports no sensation issues       Communication Communication Communication: (P) HOH   Cognition Arousal/Alertness: (P) Awake/alert Behavior During Therapy: (P) WFL for tasks assessed/performed Overall Cognitive Status: (P) Within Functional Limits for tasks assessed                                 General Comments: (P) Pt unable to speak but can write answers on paper on clipboard with small marker.  May benefit from a larger writing utensil due to hx of RA.   General Comments       Exercises Exercises: (P) Other exercises Other Exercises Other Exercises: (P) supine ther-ex performed on B LE including R LE ankle pumps, SLRs, heel slides, and hip abd/add. L PROM ankle pumps, AAROM- heel slides and hip abd/add. Needs heavy assist for all ther-ex x 10 reps.   Shoulder Instructions      Home Living Family/patient expects to be discharged to:: (P) Private residence Living Arrangements: (P) Spouse/significant other;Children Available Help at Discharge: (P) Family;Available 24 hours/day Type of Home: (P) House Home Access: (P) Ramped entrance     Home Layout: (P) One level     Bathroom Shower/Tub: (P) Tub/shower unit;Curtain   Bathroom Toilet: (P) Standard     Home Equipment: (P) Walker - 2 wheels;Bedside  commode;Grab bars - tub/shower;Shower seat;Wheelchair - manual          Prior Functioning/Environment Level of Independence: (P) Needs assistance  Gait / Transfers Assistance Needed: (P) Pt has been non-ambulatory for > one year per patient and has been bed bound for the last 6 months with spouse assisting with bed pan. ADL's / Homemaking Assistance Needed: (P) Husband assists with ADLs.   Comments: (P) Pt ambulated with RW previously due to RLE weakness (she fractured her R ankle 91yrs ago s/p a fall), which has progressively gotten worse in last few months.         OT Problem List: Decreased strength;Decreased range of motion;Decreased activity tolerance;Impaired UE functional use;Decreased coordination;Cardiopulmonary status limiting activity;Impaired  balance (sitting and/or standing)      OT Treatment/Interventions: Self-care/ADL training;Energy conservation;Therapeutic activities;Balance training;Patient/family education;Therapeutic exercise    OT Goals(Current goals can be found in the care plan section) Acute Rehab OT Goals Patient Stated Goal: "to get stronger and take care of myself" OT Goal Formulation: With patient/family Time For Goal Achievement: 08/14/17 Potential to Achieve Goals: Fair ADL Goals Pt Will Perform Grooming: with set-up;Independently Pt Will Perform Upper Body Dressing: with set-up;with min guard assist;sitting Pt Will Perform Lower Body Dressing: with mod assist;with set-up;bed level;sitting/lateral leans Pt Will Perform Toileting - Clothing Manipulation and hygiene: with max assist;with set-up;sitting/lateral leans;bed level Pt/caregiver will Perform Home Exercise Program: Increased strength;Both right and left upper extremity;With theraputty;With Supervision;With written HEP provided  OT Frequency: Min 2X/week   Barriers to D/C:            Co-evaluation              AM-PAC PT "6 Clicks" Daily Activity     Outcome Measure Help from another  person eating meals?: Total(NPO) Help from another person taking care of personal grooming?: A Little Help from another person toileting, which includes using toliet, bedpan, or urinal?: Total Help from another person bathing (including washing, rinsing, drying)?: Total Help from another person to put on and taking off regular upper body clothing?: A Lot Help from another person to put on and taking off regular lower body clothing?: Total 6 Click Score: 9   End of Session    Activity Tolerance: Patient tolerated treatment well Patient left: in bed;with call bell/phone within reach;with bed alarm set;with family/visitor present  OT Visit Diagnosis: Muscle weakness (generalized) (M62.81);Feeding difficulties (R63.3);Other abnormalities of gait and mobility (R26.89)                Time: 6503-5465 OT Time Calculation (min): 37 min Charges:  OT General Charges $OT Visit: 1 Visit OT Evaluation $OT Eval Moderate Complexity: 1 Mod OT Treatments $Self Care/Home Management : 8-22 mins G-Codes:     Chrys Racer, OTR/L ascom [phone] 07/31/17, 5:10 PM

## 2017-07-31 NOTE — Progress Notes (Signed)
NG tube advanced to 58 cm as discussed with Dr. Soyla Murphy during rounds.  KUB ordered to verify NG tube placement.

## 2017-07-31 NOTE — Progress Notes (Signed)
Still awaiting swallowing evaluation prior to doing any feeding tube on this patient.  When that is back then we will consider whether or not to place a PEG tube.

## 2017-08-01 ENCOUNTER — Inpatient Hospital Stay: Payer: Medicaid Other

## 2017-08-01 DIAGNOSIS — J9621 Acute and chronic respiratory failure with hypoxia: Secondary | ICD-10-CM

## 2017-08-01 DIAGNOSIS — J9622 Acute and chronic respiratory failure with hypercapnia: Secondary | ICD-10-CM

## 2017-08-01 LAB — CBC WITH DIFFERENTIAL/PLATELET
BASOS ABS: 0 10*3/uL (ref 0–0.1)
Basophils Relative: 1 %
Eosinophils Absolute: 0.2 10*3/uL (ref 0–0.7)
Eosinophils Relative: 2 %
HCT: 22.7 % — ABNORMAL LOW (ref 35.0–47.0)
Hemoglobin: 7.1 g/dL — ABNORMAL LOW (ref 12.0–16.0)
LYMPHS PCT: 8 %
Lymphs Abs: 0.7 10*3/uL — ABNORMAL LOW (ref 1.0–3.6)
MCH: 28.9 pg (ref 26.0–34.0)
MCHC: 31.3 g/dL — ABNORMAL LOW (ref 32.0–36.0)
MCV: 92.2 fL (ref 80.0–100.0)
Monocytes Absolute: 0.5 10*3/uL (ref 0.2–0.9)
Monocytes Relative: 6 %
NEUTROS PCT: 83 %
Neutro Abs: 7.9 10*3/uL — ABNORMAL HIGH (ref 1.4–6.5)
Platelets: 346 10*3/uL (ref 150–440)
RBC: 2.46 MIL/uL — AB (ref 3.80–5.20)
RDW: 16.8 % — ABNORMAL HIGH (ref 11.5–14.5)
WBC: 9.3 10*3/uL (ref 3.6–11.0)

## 2017-08-01 LAB — BLOOD GAS, ARTERIAL
ALLENS TEST (PASS/FAIL): POSITIVE — AB
Acid-Base Excess: 12.9 mmol/L — ABNORMAL HIGH (ref 0.0–2.0)
Bicarbonate: 37.4 mmol/L — ABNORMAL HIGH (ref 20.0–28.0)
FIO2: 0.28
MECHVT: 450 mL
O2 Saturation: 98.5 %
PATIENT TEMPERATURE: 37
PEEP: 5 cmH2O
PO2 ART: 105 mmHg (ref 83.0–108.0)
RATE: 15 resp/min
pCO2 arterial: 48 mmHg (ref 32.0–48.0)
pH, Arterial: 7.5 — ABNORMAL HIGH (ref 7.350–7.450)

## 2017-08-01 LAB — BASIC METABOLIC PANEL
Anion gap: 5 (ref 5–15)
BUN: 16 mg/dL (ref 6–20)
CHLORIDE: 100 mmol/L — AB (ref 101–111)
CO2: 34 mmol/L — ABNORMAL HIGH (ref 22–32)
Calcium: 8.9 mg/dL (ref 8.9–10.3)
Glucose, Bld: 120 mg/dL — ABNORMAL HIGH (ref 65–99)
Potassium: 3.8 mmol/L (ref 3.5–5.1)
SODIUM: 139 mmol/L (ref 135–145)

## 2017-08-01 LAB — GLUCOSE, CAPILLARY
GLUCOSE-CAPILLARY: 118 mg/dL — AB (ref 65–99)
GLUCOSE-CAPILLARY: 103 mg/dL — AB (ref 65–99)
GLUCOSE-CAPILLARY: 90 mg/dL (ref 65–99)
Glucose-Capillary: 92 mg/dL (ref 65–99)

## 2017-08-01 LAB — PREPARE RBC (CROSSMATCH)

## 2017-08-01 LAB — MAGNESIUM: Magnesium: 1.7 mg/dL (ref 1.7–2.4)

## 2017-08-01 LAB — PHOSPHORUS: Phosphorus: 4 mg/dL (ref 2.5–4.6)

## 2017-08-01 MED ORDER — ACETYLCYSTEINE 20 % IN SOLN
3.0000 mL | Freq: Three times a day (TID) | RESPIRATORY_TRACT | Status: DC
  Administered 2017-08-02 (×3): 4 mL via RESPIRATORY_TRACT
  Filled 2017-08-01 (×4): qty 4

## 2017-08-01 MED ORDER — SODIUM CHLORIDE 0.9 % IV SOLN
Freq: Once | INTRAVENOUS | Status: AC
Start: 2017-08-01 — End: 2017-08-01
  Administered 2017-08-01: 18:00:00 via INTRAVENOUS

## 2017-08-01 MED ORDER — IPRATROPIUM-ALBUTEROL 0.5-2.5 (3) MG/3ML IN SOLN
3.0000 mL | Freq: Three times a day (TID) | RESPIRATORY_TRACT | Status: DC
  Administered 2017-08-02 (×3): 3 mL via RESPIRATORY_TRACT
  Filled 2017-08-01 (×3): qty 3

## 2017-08-01 NOTE — Care Management (Signed)
Patient does not have LTAC benefits. CSW following for Vent/SNF. RNCM following for discharge to home if applicable.

## 2017-08-01 NOTE — Progress Notes (Signed)
   Name: Kayla Boyer MRN: [id number] DOB: 11-Feb-1956     CONSULTATION DATE: 07/14/2017  Subjective: No major issues last night  Subjective: Tolerates CPAP/PS and consider PEG placement if   PAST MEDICAL HISTORY :   has a past medical history of Emphysema of lung ([place]), GI bleed, and HLD (hyperlipidemia).  has a past surgical history that includes No past surgeries; Oophorectomy (Left); Esophagogastroduodenoscopy (egd) with propofol (N/A, 07/17/2017); and Tracheostomy tube placement (N/A, 07/27/2017). Prior to Admission medications   Medication Sig Start Date End Date Taking? Authorizing Provider  feeding supplement, ENSURE ENLIVE, (ENSURE ENLIVE) LIQD Take 237 mLs by mouth 2 (two) times daily between meals. 06/08/17   Vaughan Basta, MD  ferrous sulfate 325 (65 FE) MG tablet Take 1 tablet (325 mg total) by mouth 2 (two) times daily with a meal. 06/08/17   Vaughan Basta, MD  pantoprazole (PROTONIX) 40 MG tablet Take 1 tablet (40 mg total) by mouth 2 (two) times daily before a meal. 06/08/17   Vaughan Basta, MD  vitamin B-12 (CYANOCOBALAMIN) 1000 MCG tablet Take 1,000 mcg by mouth daily.    [provider]  vitamin C (ASCORBIC ACID) 500 MG tablet Take 500 mg by mouth daily.    [provider]   No Known Allergies  FAMILY HISTORY:  Family history is unknown by patient. SOCIAL HISTORY:  reports that she has never smoked. She has never used smokeless tobacco. She reports that she does not drink alcohol or use drugs.  REVIEW OF SYSTEMS:   Unable to obtain due to critical illness   VITAL SIGNS: Temp:  [98.5 F (36.9 C)-100.2 F (37.9 C)] 98.9 F (37.2 C) (04/02 1130) Pulse Rate:  [89-118] 118 (04/02 1300) Resp:  [13-25] 14 (04/02 1300) BP: (82-123)/(48-82) 110/79 (04/02 1300) SpO2:  [99 %-100 %] 100 % (04/02 1300) FiO2 (%):  [28 %] 28 % (04/02 1123)  Physical Examination:  Awake and oriented and nonfocal neurological exam Tracheostomy in  place, toleratingCPAP/PS,distress, BEAE no rales. Minimal clear secretions S1 + S2 audible and no murmur Benign abdominal examwith feebleperistalsis. Tolerating tube feed No leg edema  ASSESSMENT / PLAN:  Acute on chronic respiratory failure.Tracheostomy was placed on 3/28/201. Tolerating CPAP/PS. -Vent and trach weaning as tolerated  GI bleeding with history of chronic gastric ulcer -PPI management as per GI.Status post EGD, colonoscopy is a consideration if he continues to have GI bleeding as per GI  Atelectasis. Worsening Rt. L  airspace disease -Respiratory care, with Neb and Mucolytics. Consider starting ABX if develops SIRS. -Monitor CXR + CBC + FiO2.   Dysphagia -As per GI patient was able to swallow her secretions and recommendation to repeat swallow evaluation in the next 48 hours. -ConsiderFeeding tube placement if continue to have dysphagia or unable to wean of vent by next week.  Constipation -Bowel regimen  Depression -Remeron as per psych.  Anemia with HB has been trending down from 9.8 - 7.1 over the last week. -Transfuse 1 RBC and keep Hb > 7 gm/dl.  Full code  DVT+GI prophylaxis. Continue with supportive care.  Critical care time 35 min

## 2017-08-01 NOTE — Progress Notes (Signed)
RN spoke with Dr. Pryor Ochoa and MD gave order for respiratory therapist to remove tracheostomy sutures today.

## 2017-08-01 NOTE — Progress Notes (Signed)
Tolerated pressure support throughout shift. Sutures removed per Dr. Pryor Ochoa.  Sister visited this afternoon.

## 2017-08-01 NOTE — Progress Notes (Signed)
OT Cancellation Note  Patient Details Name: Kayla Boyer MRN: [id number] DOB: May 14, 1955   Cancelled Treatment:    Reason Eval/Treat Not Completed: Medical issues which prohibited therapy. Pt noted with Hbg 7.1, preparing for blood transfusion. Will hold OT treatment this date and re-attempt next date as appropriate.   Jeni Salles, MPH, MS, OTR/L ascom [phone] 08/01/17, 3:40 PM

## 2017-08-01 NOTE — Clinical Social Work Note (Signed)
As noted by MD, patient has a poor prognosis with weaning off the vent. At this time, CSW will begin to look for vent/snf placement for patient and will discuss this with the son. Shela Leff MSW,LCSW [phone]

## 2017-08-01 NOTE — Progress Notes (Signed)
[place] at [facility]                                                                                                                                                                                  Patient Demographics   Kayla Boyer, is a 62 y.o. female, DOB - 01/09/1956, JJH:[id number]  Admit date - 07/14/2017   Admitting Physician Saundra Shelling, MD  Outpatient Primary MD for the patient is Patient, [name]   LOS - 18   she is on weaning trials at this time  Review of Systems:  On ventilator support via tracheostomy Opens eyes and communicate some verbal commands Complete review of systems not possible  Vitals:   Vitals:   08/01/17 1000 08/01/17 1100 08/01/17 1130 08/01/17 1200  BP: 109/70 123/70  111/75  Pulse: (!) 103 (!) 109  (!) 113  Resp: 15 13  14   Temp:   98.9 F (37.2 C)   TempSrc:   Oral   SpO2: 100% 100%  100%  Weight:      Height:        Wt Readings from Last 3 Encounters:  07/31/17 58.4 kg (128 lb 12 oz)  07/05/17 60.8 kg (134 lb)  07/04/17 60.8 kg (134 lb)     Intake/Output Summary (Last 24 hours) at 08/01/2017 1327 Last data filed at 08/01/2017 1200 Gross per 24 hour  Intake 2790 ml  Output 2425 ml  Net 365 ml    Physical Exam:   GENERAL: 62 year old female patient lying on the bed in the stepdown unit on BiPAP HEAD, EYES, EARS, NOSE AND THROAT: Atraumatic, normocephalic. Extraocular muscles are intact. Pupils equal and reactive to light. Sclerae anicteric. No conjunctival injection. No oro-pharyngeal erythema.  NECK: Supple. There is no jugular venous distention. No bruits, no lymphadenopathy, no thyromegaly.  Patient has trach in the neck.   HEART: Regular rate and rhythm,. No murmurs, no rubs, no clicks.  LUNGS: Clear to auscultation bilaterally. No rales or rhonchi. No wheezes.  On ventilator support via tracheostomy ABDOMEN: Soft, flat, nontender, nondistended. Has good bowel sounds. No hepatosplenomegaly  appreciated.  EXTREMITIES: No evidence of any cyanosis, clubbing.  +2 pedal and radial pulses bilaterally.  1+ pedal edema NEUROLOGIC: The patient is alert, arousable, and oriented x2 with   Decreased hearing, left arm weakness improved Right pupil greater than left pupil SKIN: Moist and warm with no rashes appreciated.  Psych: Not be assessed LN: No inguinal LN enlargement    Antibiotics   Anti-infectives (From admission, onward)   Start     Dose/Rate Route Frequency Ordered Stop   07/14/17 2200  azithromycin (ZITHROMAX) 500 mg in  sodium chloride 0.9 % 250 mL IVPB  Status:  Discontinued     500 mg 250 mL/hr over 60 Minutes Intravenous Every 24 hours 07/14/17 2122 07/18/17 0915      Medications   Scheduled Meds: . chlorhexidine  15 mL Mouth Rinse BID  . sennosides  5 mL Per Tube BID   And  . docusate  50 mg Per Tube BID  . free water  200 mL Per Tube Q6H  . insulin aspart  0-9 Units Subcutaneous Q6H  . ipratropium-albuterol  3 mL Nebulization Q4H  . liver oil-zinc oxide   Topical Daily  . mouth rinse  15 mL Mouth Rinse q12n4p  . mirtazapine  15 mg Oral QHS  . multivitamin  15 mL Per Tube Daily  . pantoprazole sodium  40 mg Per Tube BID  . sodium chloride flush  10-40 mL Intracatheter Q12H  . thiamine  100 mg Per Tube Daily  . thyroid  30 mg Per Tube QAC breakfast   Continuous Infusions: . dextrose 25 mL/hr at 07/31/17 2000  . feeding supplement (VITAL HIGH PROTEIN) 1,000 mL (08/01/17 0504)   PRN Meds:.acetaminophen (TYLENOL) oral liquid 160 mg/5 mL, acetaminophen **OR** acetaminophen, ALPRAZolam, bisacodyl, fentaNYL (SUBLIMAZE) injection, metoprolol tartrate, morphine injection, [DISCONTINUED] ondansetron **OR** ondansetron (ZOFRAN) IV, simethicone, sodium chloride flush   Data Review:   Micro Results No results found for this or any previous visit (from the past 240 hour(s)).  Radiology Reports Dg Abd 1 View  Result Date: 07/31/2017 CLINICAL DATA:  Nasogastric  tube placement. EXAM: ABDOMEN - 1 VIEW COMPARISON:  Radiograph July 22, 2017. FINDINGS: The bowel gas pattern is normal. Distal tip of feeding tube is seen in distal stomach. No radio-opaque calculi or other significant radiographic abnormality are seen. IMPRESSION: Distal tip of nasogastric tube seen in distal stomach. No evidence of bowel obstruction or ileus. Electronically Signed   By: Marijo Conception, M.D.   On: 07/31/2017 11:47   Dg Abd 1 View  Result Date: 07/22/2017 CLINICAL DATA:  Encounter for feeding tube placement. EXAM: ABDOMEN - 1 VIEW COMPARISON:  Chest radiograph 07/22/2017 FINDINGS: A nasogastric tube has been advanced into the abdomen. Catheter tip is in the distal stomach region. Bowel gas throughout the abdomen. Persistent densities at the left lung base and the left hemidiaphragm remains obscured. IMPRESSION: Feeding tube tip in the distal stomach region. Electronically Signed   By: Markus Daft M.D.   On: 07/22/2017 16:30   Ct Head Wo Contrast  Result Date: 07/23/2017 CLINICAL DATA:  62 y/o F; changes to left pupil in strength of left arm. EXAM: CT HEAD WITHOUT CONTRAST TECHNIQUE: Contiguous axial images were obtained from the base of the skull through the vertex without intravenous contrast. COMPARISON:  None. FINDINGS: Brain: No evidence of acute infarction, hemorrhage, hydrocephalus, extra-axial collection or mass lesion/mass effect. Vascular: Calcific atherosclerosis of carotid siphons. No hyperdense vessel. Skull: Normal. Negative for fracture or focal lesion. Sinuses/Orbits: No acute finding. Other: None. IMPRESSION: Negative CT of the head. Electronically Signed   By: Kristine Garbe M.D.   On: 07/23/2017 14:33   Nm Gi Blood Loss  Result Date: 07/20/2017 CLINICAL DATA:  GI bleeding EXAM: NUCLEAR MEDICINE GASTROINTESTINAL BLEEDING SCAN TECHNIQUE: Sequential abdominal images were obtained following intravenous administration of Tc-56m labeled red blood cells.  RADIOPHARMACEUTICALS:  Twenty mCi Tc-62m pertechnetate in-vitro labeled red cells. COMPARISON:  None. FINDINGS: Normal distribution of radiotracer is noted. No focal area of tracer accumulation is identified to suggest active GI  bleeding is noted. IMPRESSION: No findings to suggest active GI hemorrhage. Electronically Signed   By: Inez Catalina M.D.   On: 07/20/2017 15:16   US Venous Img Upper Bilat  Result Date: 07/16/2017 CLINICAL DATA:  Bilateral upper extremity edema acutely with pain and discomfort for 1 day EXAM: BILATERAL UPPER EXTREMITY VENOUS DOPPLER ULTRASOUND TECHNIQUE: Gray-scale sonography with graded compression, as well as color Doppler and duplex ultrasound were performed to evaluate the bilateral upper extremity deep venous systems from the level of the subclavian vein and including the jugular, axillary, basilic, radial, ulnar and upper cephalic vein. Spectral Doppler was utilized to evaluate flow at rest and with distal augmentation maneuvers. COMPARISON:  None. FINDINGS: Exam is limited because of upper extremity subcutaneous edema. RIGHT UPPER EXTREMITY Internal Jugular Vein: No evidence of thrombus. Normal compressibility, respiratory phasicity and response to augmentation. Subclavian Vein: No evidence of thrombus. Normal compressibility, respiratory phasicity and response to augmentation. Axillary Vein: No evidence of thrombus. Normal compressibility, respiratory phasicity and response to augmentation. Cephalic Vein: No evidence of thrombus. Normal compressibility, respiratory phasicity and response to augmentation. Basilic Vein: Not well visualized to accurately assess. Brachial Veins: No evidence of thrombus. Normal compressibility, respiratory phasicity and response to augmentation. Radial Veins: No evidence of thrombus. Normal compressibility, respiratory phasicity and response to augmentation. Ulnar Veins: No evidence of thrombus. Normal compressibility, respiratory phasicity and  response to augmentation. Venous Reflux:  None. Other Findings:  Peripheral edema noted LEFT UPPER EXTREMITY Internal Jugular Vein: No evidence of thrombus. Normal compressibility, respiratory phasicity and response to augmentation. Subclavian Vein: No evidence of thrombus. Normal compressibility, respiratory phasicity and response to augmentation. Axillary Vein: No evidence of thrombus. Normal compressibility, respiratory phasicity and response to augmentation. Cephalic Vein: No evidence of thrombus. Normal compressibility, respiratory phasicity and response to augmentation. Basilic Vein: No evidence of thrombus. Normal compressibility, respiratory phasicity and response to augmentation. Brachial Veins: No evidence of thrombus. Normal compressibility, respiratory phasicity and response to augmentation. Radial Veins: No evidence of thrombus. Normal compressibility, respiratory phasicity and response to augmentation. Ulnar Veins: No evidence of thrombus. Normal compressibility, respiratory phasicity and response to augmentation. Venous Reflux:  None. Other Findings:  But peripheral subcutaneous edema noted. IMPRESSION: Limited exam but no gross occlusive upper extremity DVT in either arm. Electronically Signed   By: Jerilynn Mages.  Shick M.D.   On: 07/16/2017 12:34   US Venous Img Upper Uni Left  Result Date: 07/25/2017 CLINICAL DATA:  Left upper extremity edema. EXAM: LEFT UPPER EXTREMITY VENOUS DOPPLER ULTRASOUND TECHNIQUE: Gray-scale sonography with graded compression, as well as color Doppler and duplex ultrasound were performed to evaluate the upper extremity deep venous system from the level of the subclavian vein and including the jugular, axillary, basilic, radial, ulnar and upper cephalic vein. Spectral Doppler was utilized to evaluate flow at rest and with distal augmentation maneuvers. COMPARISON:  None. FINDINGS: Contralateral Subclavian Vein: Respiratory phasicity is normal and symmetric with the symptomatic  side. No evidence of thrombus. Normal compressibility. Internal Jugular Vein: No evidence of thrombus. Normal compressibility, respiratory phasicity and response to augmentation. Subclavian Vein: No evidence of thrombus. Normal compressibility, respiratory phasicity and response to augmentation. Axillary Vein: No evidence of thrombus. Normal compressibility, respiratory phasicity and response to augmentation. Cephalic Vein: No evidence of thrombus. Normal compressibility, respiratory phasicity and response to augmentation. Basilic Vein: No evidence of thrombus. Normal compressibility, respiratory phasicity and response to augmentation. Brachial Veins: No evidence of thrombus. Normal compressibility, respiratory phasicity and response to augmentation. Radial Veins: No  evidence of thrombus. Normal compressibility, respiratory phasicity and response to augmentation. Ulnar Veins: No evidence of thrombus. Normal compressibility, respiratory phasicity and response to augmentation. Venous Reflux:  None visualized. Other Findings: There is significant edema noted in the left upper arm including some more locally marginated fluid in the subcutaneous tissues of the left medial upper arm. This fluid appears to extend over a long distance and likely represents subcutaneous fluid which is more prominent in 1 area of the upper arm. If there is any concern for soft tissue infection, additional imaging may be warranted. IMPRESSION: 1. No evidence of left upper extremity deep venous thrombosis. 2. Significant subcutaneous fluid in the left upper arm with an area of more marginated subcutaneous fluid also identified in the medial left upper arm. Although this does not have the appearance of a focal abscess by ultrasound, if there is concern for upper extremity infection, additional imaging may be warranted with CT and/or MRI. Electronically Signed   By: Aletta Edouard M.D.   On: 07/25/2017 17:48   Dg Chest Port 1 View  Result  Date: 08/01/2017 CLINICAL DATA:  Pneumonia EXAM: PORTABLE CHEST 1 VIEW COMPARISON:  07/30/2017, 07/29/2017, 07/27/2017, 07/23/2017 FINDINGS: Tracheostomy tube remains in place. Esophageal tube tip is below the diaphragm but non included. Interval opacification of the right diaphragm. No change in dense left lung base consolidation. Probable tiny pleural effusions. Enlarged cardiomediastinal silhouette is unchanged. Right upper extremity catheter tip overlies the SVC. IMPRESSION: 1. Increased opacity at the right base may reflect atelectasis 2. No significant change in probable small left effusion and dense left lung base consolidation, atelectasis versus pneumonia. 3. Stable cardiomegaly Electronically Signed   By: Donavan Foil M.D.   On: 08/01/2017 02:56   Dg Chest Port 1 View  Result Date: 07/30/2017 CLINICAL DATA:  Pneumonia EXAM: PORTABLE CHEST 1 VIEW COMPARISON:  07/29/2017 and prior radiographs FINDINGS: A tracheostomy tube, RIGHT PICC line with tip overlying the mid-LOWER SVC and endotracheal tube with tip overlying the distal esophagus again noted. Decreased LEFT LOWER lung consolidation/atelectasis noted. Mild RIGHT basilar atelectasis again noted. There is no evidence of pneumothorax. No other interval changes identified. IMPRESSION: 1. Decreased LEFT LOWER lung consolidation/atelectasis. 2. NG tube with tip overlying the distal esophagus-recommend advancement. Electronically Signed   By: Margarette Canada M.D.   On: 07/30/2017 07:49   Dg Chest Port 1 View  Result Date: 07/29/2017 CLINICAL DATA:  Acute on chronic respiratory failure. EXAM: PORTABLE CHEST 1 VIEW COMPARISON:  Radiograph July 27, 2017. FINDINGS: Stable cardiomegaly. Tracheostomy is in grossly good position. Distal tip of nasogastric tube is seen in distal esophagus. No pneumothorax is noted. Right-sided PICC line is noted with distal tip in expected position of SVC. Mild bibasilar subsegmental atelectasis is noted. Bony thorax is  unremarkable. IMPRESSION: Mild bibasilar subsegmental atelectasis. Endotracheal tube in grossly good position. Distal tip of nasogastric tube has withdrawn into distal esophagus. Electronically Signed   By: Marijo Conception, M.D.   On: 07/29/2017 22:01   Dg Chest Port 1 View  Result Date: 07/27/2017 CLINICAL DATA:  Excessive fluid EXAM: PORTABLE CHEST 1 VIEW COMPARISON:  Chest x-ray of July 23, 2017 FINDINGS: The lungs are well-expanded. There is a trace of pleural fluid on the right and slightly more on the left. The retrocardiac region remains dense. The cardiac silhouette is enlarged. The central pulmonary vascularity is prominent. The mediastinum is normal in width. The trachea is midline. The right-sided PICC line tip projects over the midportion of  the SVC. The tracheostomy tube tip projects at the superior margin of the clavicular heads. The bony thorax exhibits no acute abnormality. IMPRESSION: Slight improved aeration of both lungs today. Small amounts of pleural fluid at both bases. Persistent atelectasis or pneumonia in the left lower lobe. Top-normal cardiac size with central pulmonary vascular congestion but no definite pulmonary edema. Electronically Signed   By: David  Martinique M.D.   On: 07/27/2017 10:55   Dg Chest Port 1 View  Result Date: 07/23/2017 CLINICAL DATA:  Acute on chronic respiratory failure. Airway aspiration. EXAM: PORTABLE CHEST 1 VIEW COMPARISON:  07/22/2017. FINDINGS: Interval enlargement of the cardiac silhouette. Stable dense left lower lobe airspace opacity. Interval increased linear and patchy density at the right lung base. Stable small bilateral pleural effusions. Nasogastric tube extending into the stomach. Thoracic spine degenerative changes. IMPRESSION: 1. No significant change in dense left lower lobe atelectasis or pneumonia and small left pleural effusion. 2. Increased right basilar atelectasis or pneumonia with a stable small right pleural effusion. Electronically  Signed   By: Claudie Revering M.D.   On: 07/23/2017 16:09   Dg Chest Port 1 View  Result Date: 07/22/2017 CLINICAL DATA:  Respiratory failure.  COPD. EXAM: PORTABLE CHEST 1 VIEW COMPARISON:  07/19/2017. FINDINGS: The cardiac silhouette remains borderline enlarged. Stable left pleural effusion and left basilar airspace opacity. Small right pleural effusion, decreased, with decreased adjacent right basilar atelectasis. Thoracic spine degenerative changes. IMPRESSION: 1. Stable left pleural effusion and left lower lobe atelectasis or pneumonia. 2. Decreased right pleural fluid and right basilar atelectasis. Electronically Signed   By: Claudie Revering M.D.   On: 07/22/2017 09:14   Dg Chest Port 1 View  Result Date: 07/19/2017 CLINICAL DATA:  Respiratory failure EXAM: PORTABLE CHEST 1 VIEW COMPARISON:  July 15, 2017 FINDINGS: There are small pleural effusions bilaterally. There is consolidation in the left lower lobe. Lungs elsewhere are clear. Heart size is upper normal with pulmonary vascularity within normal limits. No adenopathy. There is degenerative change in the thoracic spine. IMPRESSION: Left lower lobe consolidation with small pleural effusions bilaterally. There is mild bibasilar atelectasis. Stable cardiac silhouette. Electronically Signed   By: Lowella Grip III M.D.   On: 07/19/2017 07:57   Dg Chest Port 1 View  Result Date: 07/15/2017 CLINICAL DATA:  Acute respiratory failure. EXAM: PORTABLE CHEST 1 VIEW COMPARISON:  Chest x-ray from yesterday FINDINGS: The heart size and mediastinal contours are within normal limits. Normal pulmonary vascularity. Unchanged left greater than right bibasilar atelectasis and scarring at the costophrenic angles. No focal consolidation, pleural effusion, or pneumothorax. No acute osseous abnormality. IMPRESSION: Stable bibasilar atelectasis/scarring. Electronically Signed   By: Titus Dubin M.D.   On: 07/15/2017 10:41   Dg Chest Portable 1 View  Result Date:  07/14/2017 CLINICAL DATA:  Shortness of breath. EXAM: PORTABLE CHEST 1 VIEW COMPARISON:  06/05/2017. FINDINGS: Mediastinum and hilar structures normal. Stable mild cardiomegaly with normal pulmonary vascularity. Low lung volumes with mild basilar atelectasis. Stable bilateral pleural thickening consistent scarring. Degenerative changes thoracic spine. IMPRESSION: Low lung volumes with mild basilar atelectasis and/or pleuroparenchymal scarring again noted. Stable mild cardiomegaly. Chest is stable from prior exam. Electronically Signed   By: Marcello Moores  Register   On: 07/14/2017 17:13   Ct Renal Stone Study  Result Date: 07/04/2017 CLINICAL DATA:  62 year old female with blood on pad. Question rectal bleeding, vaginal bleeding or blood in urine. Initial encounter. EXAM: CT ABDOMEN AND PELVIS WITHOUT CONTRAST TECHNIQUE: Multidetector CT imaging of the  abdomen and pelvis was performed following the standard protocol without IV contrast. COMPARISON:  06/05/2017 CT. FINDINGS: Lower chest: Bibasilar atelectasis greater left once again noted. Hepatobiliary: Multiple gallstones. No CT evidence of surrounding inflammation. If patient had right upper quadrant tenderness, ultrasound can be obtained for further delineation. Taking into account limitation by non contrast imaging, no worrisome liver lesion. Pancreas: Taking into account limitation by non contrast imaging, no worrisome pancreatic lesion or inflammation. Spleen: Taking into account limitation by non contrast imaging, no worrisome splenic lesion or enlargement. Adrenals/Urinary Tract: When compared to the recent CT, there is mild fullness of the right renal collecting system without obstructing stone identified. This may be related to recently passed stone. Bilateral tiny/small nonobstructing renal calculi are noted (largest left lower pole measures 3.5 mm). Taking into account limitation by non contrast imaging, no worrisome renal or adrenal lesion. Noncontrast  filled views of the urinary bladder unremarkable. Stomach/Bowel: Moderate stool rectosigmoid region. Superior extension sigmoid colon. No extraluminal bowel inflammatory process noted. Under distended stomach without gross abnormality. Tiny lipoma third portion of the duodenum. Vascular/Lymphatic: Aortic and iliac artery calcification. No aneurysm. No adenopathy. Reproductive: Retroverted uterus.  No adnexal mass. Other: No free intraperitoneal air or bowel containing hernia. Musculoskeletal: No worrisome osseous abnormality. IMPRESSION: When compared to recent CT, there is mild fullness of the right renal collecting system without obstructing stone identified. This may be related to recently passed stone or blood clot. Bilateral tiny/small nonobstructing renal calculi. Multiple gallstones. Bibasilar atelectasis greater left once again noted. Moderate stool rectosigmoid region. Superior extension sigmoid colon. No extraluminal bowel inflammatory process noted. Tiny lipoma third portion of the duodenum. Aortic Atherosclerosis (ICD10-I70.0). Electronically Signed   By: Genia Del M.D.   On: 07/04/2017 08:03   [place] Ekg Site Rite  Result Date: 07/25/2017 If Site Rite image not attached, placement could not be confirmed due to current cardiac rhythm.    CBC Recent Labs  Lab 07/26/17 0502 07/30/17 0459 08/01/17 0436  WBC 9.3 12.8* 9.3  HGB 8.7* 7.5* 7.1*  HCT 26.0* 23.2* 22.7*  PLT 361 353 346  MCV 91.3 90.9 92.2  MCH 30.4 29.2 28.9  MCHC 33.3 32.1 31.3*  RDW 17.4* 17.2* 16.8*  LYMPHSABS 0.3* 0.7* 0.7*  MONOABS 0.9 0.7 0.5  EOSABS 0.3 0.2 0.2  BASOSABS 0.0 0.0 0.0    Chemistries  Recent Labs  Lab 07/26/17 0502 07/27/17 0401 07/28/17 0519 07/29/17 0808 07/30/17 0459 08/01/17 0436  NA 141 138 135 135  --  139  K 4.4 3.9 3.9 3.6 3.8 3.8  CL 95* 92* 92* 94*  --  100*  CO2 39* 38* 33* 32  --  34*  GLUCOSE 127* 112* 138* 120*  --  120*  BUN 9 12 14 15   --  16  CREATININE <0.30* <0.30*  <0.30* <0.30*  --  <0.30*  CALCIUM 8.5* 8.8* 8.8* 8.4*  --  8.9  MG 1.7  --  1.6* 1.8 1.7 1.7   ------------------------------------------------------------------------------------------------------------------ CrCl cannot be calculated (This lab value cannot be used to calculate CrCl because it is not a number: <0.30). ------------------------------------------------------------------------------------------------------------------ No results for input(s): HGBA1C in the last 72 hours. ------------------------------------------------------------------------------------------------------------------ No results for input(s): CHOL, HDL, LDLCALC, TRIG, CHOLHDL, LDLDIRECT in the last 72 hours. ------------------------------------------------------------------------------------------------------------------ No results for input(s): TSH, T4TOTAL, T3FREE, THYROIDAB in the last 72 hours.  Invalid input(s): FREET3 ------------------------------------------------------------------------------------------------------------------ No results for input(s): VITAMINB12, FOLATE, FERRITIN, TIBC, IRON, RETICCTPCT in the last 72 hours.  Coagulation profile No results for  input(s): INR, PROTIME in the last 168 hours.  No results for input(s): DDIMER in the last 72 hours.  Cardiac Enzymes No results for input(s): CKMB, TROPONINI, MYOGLOBIN in the last 168 hours.  Invalid input(s): CK ------------------------------------------------------------------------------------------------------------------ Invalid input(s): POCBNP    Assessment & Plan   62 year old female patient with history of chronic respiratory failure, GI bleed currently in the hospital for acute on chronic hypercapnic respiratory failure and anemia secondary to slow GI bleed.  1.Acute on chronic hypercapnic respiratory failure S/p tracheostomy on ventilator support Impaired ventilatory drive and global muscle weakness Appreciate  intensivist management. weaning Trial to see if she can be on trach collar  2. Moderate malnutrition  Feeding supplements  3.  Moderate malnutrition  Continue NG tube feeding, appreciate GI following.,waiting for swallow eval 4. Electrolyte imbalance F/u magnesium and potassium  5.  Acute on chronic gastrointestinal bleeding s/p EGD 3/18 showing Normal duodenal bulb and second portion of the duodenum.Duodenal lipoma. Non-bleeding gastric ulcer with a clean ulcer base (Forrest Class III).Normal gastroesophageal junction and esophagus. - protonix 40 mg IV bid - 6. DVT prophylaxis with SCD  7. Hypomagnesemia : Replace magnesium  8.  Management follow-up for possible LTAC placement          Code Status Orders  (From admission, onward)        Start     Ordered   07/14/17 2115  Full code  Continuous     07/14/17 2122    Code Status History    Date Active Date Inactive Code Status Order ID Comments User Context   06/05/2017 1654 06/08/2017 1820 Full Code [id number]  Bettey Costa, MD Inpatient   11/01/2016 0121 11/09/2016 2044 Full Code [id number]  Lance Coon, MD Inpatient       Time Spent in minutes   35 minutes  Greater than 50% of time spent in care coordination and counseling patient regarding the condition and plan of care.   Epifanio Lesches M.D on 08/01/2017 at 1:27 PM  Between 7am to 6pm - Pager - [phone]  After 6pm go to [email or web address] - Proofreader  [employer]   Office  [phone]

## 2017-08-02 ENCOUNTER — Inpatient Hospital Stay: Payer: Medicaid Other

## 2017-08-02 DIAGNOSIS — R1310 Dysphagia, unspecified: Secondary | ICD-10-CM

## 2017-08-02 LAB — BPAM RBC
Blood Product Expiration Date: 201904262359
ISSUE DATE / TIME: 201904021745
Unit Type and Rh: 6200

## 2017-08-02 LAB — BASIC METABOLIC PANEL
Anion gap: 8 (ref 5–15)
BUN: 16 mg/dL (ref 6–20)
CO2: 32 mmol/L (ref 22–32)
Calcium: 9.1 mg/dL (ref 8.9–10.3)
Chloride: 99 mmol/L — ABNORMAL LOW (ref 101–111)
Creatinine, Ser: <0.3 mg/dL — ABNORMAL LOW (ref 0.44–1.00)
Glucose, Bld: 113 mg/dL — ABNORMAL HIGH (ref 65–99)
POTASSIUM: 3.7 mmol/L (ref 3.5–5.1)
SODIUM: 139 mmol/L (ref 135–145)

## 2017-08-02 LAB — CBC WITH DIFFERENTIAL/PLATELET
BASOS PCT: 0 %
Basophils Absolute: 0 10*3/uL (ref 0–0.1)
EOS ABS: 0.2 10*3/uL (ref 0–0.7)
Eosinophils Relative: 1 %
HCT: 28.7 % — ABNORMAL LOW (ref 35.0–47.0)
Hemoglobin: 9.2 g/dL — ABNORMAL LOW (ref 12.0–16.0)
Lymphocytes Relative: 5 %
Lymphs Abs: 0.7 10*3/uL — ABNORMAL LOW (ref 1.0–3.6)
MCH: 29.2 pg (ref 26.0–34.0)
MCHC: 32.2 g/dL (ref 32.0–36.0)
MCV: 90.9 fL (ref 80.0–100.0)
MONO ABS: 0.7 10*3/uL (ref 0.2–0.9)
MONOS PCT: 5 %
NEUTROS PCT: 89 %
Neutro Abs: 11.9 10*3/uL — ABNORMAL HIGH (ref 1.4–6.5)
Platelets: 322 10*3/uL (ref 150–440)
RBC: 3.16 MIL/uL — ABNORMAL LOW (ref 3.80–5.20)
RDW: 16.2 % — ABNORMAL HIGH (ref 11.5–14.5)
WBC: 13.4 10*3/uL — ABNORMAL HIGH (ref 3.6–11.0)

## 2017-08-02 LAB — TYPE AND SCREEN
ABO/RH(D): A POS
Antibody Screen: NEGATIVE
Unit division: 0

## 2017-08-02 LAB — GLUCOSE, CAPILLARY
GLUCOSE-CAPILLARY: 101 mg/dL — AB (ref 65–99)
GLUCOSE-CAPILLARY: 81 mg/dL (ref 65–99)
Glucose-Capillary: 112 mg/dL — ABNORMAL HIGH (ref 65–99)

## 2017-08-02 LAB — PHOSPHORUS: Phosphorus: 3.8 mg/dL (ref 2.5–4.6)

## 2017-08-02 LAB — PROCALCITONIN: PROCALCITONIN: 0.18 ng/mL

## 2017-08-02 LAB — HEMOGLOBIN: HEMOGLOBIN: 9.5 g/dL — AB (ref 12.0–16.0)

## 2017-08-02 LAB — CALCIUM, IONIZED: CALCIUM, IONIZED, SERUM: 5.3 mg/dL (ref 4.5–5.6)

## 2017-08-02 LAB — MAGNESIUM: MAGNESIUM: 1.5 mg/dL — AB (ref 1.7–2.4)

## 2017-08-02 MED ORDER — PIPERACILLIN-TAZOBACTAM 3.375 G IVPB: 3.3750 g | Freq: Three times a day (TID) | INTRAVENOUS | Status: DC

## 2017-08-02 MED ORDER — MAGNESIUM SULFATE 2 GM/50ML IV SOLN
2.0000 g | Freq: Once | INTRAVENOUS | Status: AC
  Administered 2017-08-02: 2 g via INTRAVENOUS
  Filled 2017-08-02: qty 50

## 2017-08-02 MED ORDER — ORAL CARE MOUTH RINSE
15.0000 mL | Freq: Four times a day (QID) | OROMUCOSAL | Status: DC
  Administered 2017-08-02 – 2017-08-17 (×37): 15 mL via OROMUCOSAL

## 2017-08-02 MED ORDER — ACETYLCYSTEINE 20 % IN SOLN
3.0000 mL | Freq: Two times a day (BID) | RESPIRATORY_TRACT | Status: DC
  Administered 2017-08-02 – 2017-08-04 (×3): 3 mL via RESPIRATORY_TRACT
  Filled 2017-08-02 (×3): qty 4

## 2017-08-02 MED ORDER — IPRATROPIUM-ALBUTEROL 0.5-2.5 (3) MG/3ML IN SOLN
3.0000 mL | Freq: Three times a day (TID) | RESPIRATORY_TRACT | Status: DC
  Administered 2017-08-02 – 2017-08-09 (×20): 3 mL via RESPIRATORY_TRACT
  Filled 2017-08-02 (×20): qty 3

## 2017-08-02 MED ORDER — PIPERACILLIN-TAZOBACTAM 3.375 G IVPB
3.3750 g | Freq: Three times a day (TID) | INTRAVENOUS | Status: DC
  Administered 2017-08-02 – 2017-08-04 (×6): 3.375 g via INTRAVENOUS
  Filled 2017-08-02 (×6): qty 50

## 2017-08-02 NOTE — Progress Notes (Signed)
[place] at [facility]                                                                                                                                                                                  Patient Demographics   Kayla Boyer, is a 62 y.o. female, DOB - 1956-04-06, STM:[id number]  Admit date - 07/14/2017   Admitting Physician Saundra Shelling, MD  Outpatient Primary MD for the patient is Patient, [name]   LOS - 19   she is on weaning trials at this time  Review of Systems:  On ventilator support via tracheostomy, patient feels short of breath and states that she could not pretend she is on pressure support.,  Told respiratory therapist  about that Opens eyes and communicate some verbal commands Complete review of systems not possible  Vitals:   Vitals:   08/02/17 1129 08/02/17 1200 08/02/17 1300 08/02/17 1400  BP:  115/76 107/74 99/69  Pulse:  99 93 92  Resp:  12 15 13   Temp:  99 F (37.2 C)    TempSrc:  Oral    SpO2: 100% 100% 96% 99%  Weight:      Height:        Wt Readings from Last 3 Encounters:  08/02/17 57.4 kg (126 lb 8.7 oz)  07/05/17 60.8 kg (134 lb)  07/04/17 60.8 kg (134 lb)     Intake/Output Summary (Last 24 hours) at 08/02/2017 1454 Last data filed at 08/02/2017 1400 Gross per 24 hour  Intake 2404.17 ml  Output 1910 ml  Net 494.17 ml    Physical Exam:   GENERAL: 62 year old female patient lying on the bed in the stepdown unit on BiPAP HEAD, EYES, EARS, NOSE AND THROAT: Atraumatic, normocephalic. Extraocular muscles are intact. Pupils equal and reactive to light. Sclerae anicteric. No conjunctival injection. No oro-pharyngeal erythema.  NECK: Supple. There is no jugular venous distention. No bruits, no lymphadenopathy, no thyromegaly.  Patient has trach in the neck.   HEART: Regular rate and rhythm,. No murmurs, no rubs, no clicks.  LUNGS: Clear to auscultation bilaterally. No rales or rhonchi. No wheezes.  On  ventilator support via tracheostomy ABDOMEN: Soft, flat, nontender, nondistended. Has good bowel sounds. No hepatosplenomegaly appreciated.  EXTREMITIES: No evidence of any cyanosis, clubbing.  +2 pedal and radial pulses bilaterally.  1+ pedal edema NEUROLOGIC: The patient is alert, arousable, and oriented x2 with   Decreased hearing, left arm weakness improved Right pupil greater than left pupil SKIN: Moist and warm with no rashes appreciated.  Psych: Not be assessed LN: No inguinal LN enlargement    Antibiotics   Anti-infectives (From admission, onward)  Start     Dose/Rate Route Frequency Ordered Stop   07/14/17 2200  azithromycin (ZITHROMAX) 500 mg in sodium chloride 0.9 % 250 mL IVPB  Status:  Discontinued     500 mg 250 mL/hr over 60 Minutes Intravenous Every 24 hours 07/14/17 2122 07/18/17 0915      Medications   Scheduled Meds: . acetylcysteine  3 mL Nebulization TID  . chlorhexidine  15 mL Mouth Rinse BID  . sennosides  5 mL Per Tube BID   And  . docusate  50 mg Per Tube BID  . free water  200 mL Per Tube Q6H  . insulin aspart  0-9 Units Subcutaneous Q6H  . ipratropium-albuterol  3 mL Nebulization Q8H  . liver oil-zinc oxide   Topical Daily  . mouth rinse  15 mL Mouth Rinse QID  . mirtazapine  15 mg Oral QHS  . multivitamin  15 mL Per Tube Daily  . pantoprazole sodium  40 mg Per Tube BID  . sodium chloride flush  10-40 mL Intracatheter Q12H  . thiamine  100 mg Per Tube Daily  . thyroid  30 mg Per Tube QAC breakfast   Continuous Infusions: . dextrose 25 mL/hr at 08/02/17 0800  . feeding supplement (VITAL HIGH PROTEIN) 50 mL/hr at 08/02/17 0800   PRN Meds:.acetaminophen (TYLENOL) oral liquid 160 mg/5 mL, acetaminophen **OR** acetaminophen, ALPRAZolam, bisacodyl, metoprolol tartrate, morphine injection, [DISCONTINUED] ondansetron **OR** ondansetron (ZOFRAN) IV, simethicone, sodium chloride flush   Data Review:   Micro Results No results found for this or any  previous visit (from the past 240 hour(s)).  Radiology Reports Dg Abd 1 View  Result Date: 07/31/2017 CLINICAL DATA:  Nasogastric tube placement. EXAM: ABDOMEN - 1 VIEW COMPARISON:  Radiograph July 22, 2017. FINDINGS: The bowel gas pattern is normal. Distal tip of feeding tube is seen in distal stomach. No radio-opaque calculi or other significant radiographic abnormality are seen. IMPRESSION: Distal tip of nasogastric tube seen in distal stomach. No evidence of bowel obstruction or ileus. Electronically Signed   By: Marijo Conception, M.D.   On: 07/31/2017 11:47   Dg Abd 1 View  Result Date: 07/22/2017 CLINICAL DATA:  Encounter for feeding tube placement. EXAM: ABDOMEN - 1 VIEW COMPARISON:  Chest radiograph 07/22/2017 FINDINGS: A nasogastric tube has been advanced into the abdomen. Catheter tip is in the distal stomach region. Bowel gas throughout the abdomen. Persistent densities at the left lung base and the left hemidiaphragm remains obscured. IMPRESSION: Feeding tube tip in the distal stomach region. Electronically Signed   By: Markus Daft M.D.   On: 07/22/2017 16:30   Ct Head Wo Contrast  Result Date: 07/23/2017 CLINICAL DATA:  62 y/o F; changes to left pupil in strength of left arm. EXAM: CT HEAD WITHOUT CONTRAST TECHNIQUE: Contiguous axial images were obtained from the base of the skull through the vertex without intravenous contrast. COMPARISON:  None. FINDINGS: Brain: No evidence of acute infarction, hemorrhage, hydrocephalus, extra-axial collection or mass lesion/mass effect. Vascular: Calcific atherosclerosis of carotid siphons. No hyperdense vessel. Skull: Normal. Negative for fracture or focal lesion. Sinuses/Orbits: No acute finding. Other: None. IMPRESSION: Negative CT of the head. Electronically Signed   By: Kristine Garbe M.D.   On: 07/23/2017 14:33   Nm Gi Blood Loss  Result Date: 07/20/2017 CLINICAL DATA:  GI bleeding EXAM: NUCLEAR MEDICINE GASTROINTESTINAL BLEEDING SCAN  TECHNIQUE: Sequential abdominal images were obtained following intravenous administration of Tc-51m labeled red blood cells. RADIOPHARMACEUTICALS:  Twenty mCi Tc-74m pertechnetate  in-vitro labeled red cells. COMPARISON:  None. FINDINGS: Normal distribution of radiotracer is noted. No focal area of tracer accumulation is identified to suggest active GI bleeding is noted. IMPRESSION: No findings to suggest active GI hemorrhage. Electronically Signed   By: Inez Catalina M.D.   On: 07/20/2017 15:16   US Venous Img Upper Bilat  Result Date: 07/16/2017 CLINICAL DATA:  Bilateral upper extremity edema acutely with pain and discomfort for 1 day EXAM: BILATERAL UPPER EXTREMITY VENOUS DOPPLER ULTRASOUND TECHNIQUE: Gray-scale sonography with graded compression, as well as color Doppler and duplex ultrasound were performed to evaluate the bilateral upper extremity deep venous systems from the level of the subclavian vein and including the jugular, axillary, basilic, radial, ulnar and upper cephalic vein. Spectral Doppler was utilized to evaluate flow at rest and with distal augmentation maneuvers. COMPARISON:  None. FINDINGS: Exam is limited because of upper extremity subcutaneous edema. RIGHT UPPER EXTREMITY Internal Jugular Vein: No evidence of thrombus. Normal compressibility, respiratory phasicity and response to augmentation. Subclavian Vein: No evidence of thrombus. Normal compressibility, respiratory phasicity and response to augmentation. Axillary Vein: No evidence of thrombus. Normal compressibility, respiratory phasicity and response to augmentation. Cephalic Vein: No evidence of thrombus. Normal compressibility, respiratory phasicity and response to augmentation. Basilic Vein: Not well visualized to accurately assess. Brachial Veins: No evidence of thrombus. Normal compressibility, respiratory phasicity and response to augmentation. Radial Veins: No evidence of thrombus. Normal compressibility, respiratory  phasicity and response to augmentation. Ulnar Veins: No evidence of thrombus. Normal compressibility, respiratory phasicity and response to augmentation. Venous Reflux:  None. Other Findings:  Peripheral edema noted LEFT UPPER EXTREMITY Internal Jugular Vein: No evidence of thrombus. Normal compressibility, respiratory phasicity and response to augmentation. Subclavian Vein: No evidence of thrombus. Normal compressibility, respiratory phasicity and response to augmentation. Axillary Vein: No evidence of thrombus. Normal compressibility, respiratory phasicity and response to augmentation. Cephalic Vein: No evidence of thrombus. Normal compressibility, respiratory phasicity and response to augmentation. Basilic Vein: No evidence of thrombus. Normal compressibility, respiratory phasicity and response to augmentation. Brachial Veins: No evidence of thrombus. Normal compressibility, respiratory phasicity and response to augmentation. Radial Veins: No evidence of thrombus. Normal compressibility, respiratory phasicity and response to augmentation. Ulnar Veins: No evidence of thrombus. Normal compressibility, respiratory phasicity and response to augmentation. Venous Reflux:  None. Other Findings:  But peripheral subcutaneous edema noted. IMPRESSION: Limited exam but no gross occlusive upper extremity DVT in either arm. Electronically Signed   By: Jerilynn Mages.  Shick M.D.   On: 07/16/2017 12:34   US Venous Img Upper Uni Left  Result Date: 07/25/2017 CLINICAL DATA:  Left upper extremity edema. EXAM: LEFT UPPER EXTREMITY VENOUS DOPPLER ULTRASOUND TECHNIQUE: Gray-scale sonography with graded compression, as well as color Doppler and duplex ultrasound were performed to evaluate the upper extremity deep venous system from the level of the subclavian vein and including the jugular, axillary, basilic, radial, ulnar and upper cephalic vein. Spectral Doppler was utilized to evaluate flow at rest and with distal augmentation maneuvers.  COMPARISON:  None. FINDINGS: Contralateral Subclavian Vein: Respiratory phasicity is normal and symmetric with the symptomatic side. No evidence of thrombus. Normal compressibility. Internal Jugular Vein: No evidence of thrombus. Normal compressibility, respiratory phasicity and response to augmentation. Subclavian Vein: No evidence of thrombus. Normal compressibility, respiratory phasicity and response to augmentation. Axillary Vein: No evidence of thrombus. Normal compressibility, respiratory phasicity and response to augmentation. Cephalic Vein: No evidence of thrombus. Normal compressibility, respiratory phasicity and response to augmentation. Basilic Vein: No evidence of  thrombus. Normal compressibility, respiratory phasicity and response to augmentation. Brachial Veins: No evidence of thrombus. Normal compressibility, respiratory phasicity and response to augmentation. Radial Veins: No evidence of thrombus. Normal compressibility, respiratory phasicity and response to augmentation. Ulnar Veins: No evidence of thrombus. Normal compressibility, respiratory phasicity and response to augmentation. Venous Reflux:  None visualized. Other Findings: There is significant edema noted in the left upper arm including some more locally marginated fluid in the subcutaneous tissues of the left medial upper arm. This fluid appears to extend over a long distance and likely represents subcutaneous fluid which is more prominent in 1 area of the upper arm. If there is any concern for soft tissue infection, additional imaging may be warranted. IMPRESSION: 1. No evidence of left upper extremity deep venous thrombosis. 2. Significant subcutaneous fluid in the left upper arm with an area of more marginated subcutaneous fluid also identified in the medial left upper arm. Although this does not have the appearance of a focal abscess by ultrasound, if there is concern for upper extremity infection, additional imaging may be warranted  with CT and/or MRI. Electronically Signed   By: Aletta Edouard M.D.   On: 07/25/2017 17:48   Dg Chest Port 1 View  Result Date: 08/02/2017 CLINICAL DATA:  Shortness of Breath EXAM: PORTABLE CHEST 1 VIEW COMPARISON:  August 01, 2017 FINDINGS: Tracheostomy catheter tip is 5.0 cm above the carina. Central catheter tip is in the superior vena cava. Nasogastric tube tip and side port are below the diaphragm. No pneumothorax. There is airspace consolidation left lower lobe with small left pleural effusion. There is mild right base atelectasis. There is cardiomegaly with pulmonary vascularity within normal limits. No adenopathy. No bone lesions. IMPRESSION: Tube and catheter positions as described without evident pneumothorax. Airspace consolidation consistent with pneumonia left lower lobe with small left pleural effusion. Mild right base atelectasis. Stable cardiac prominence. Electronically Signed   By: Lowella Grip III M.D.   On: 08/02/2017 07:55   Dg Chest Port 1 View  Result Date: 08/01/2017 CLINICAL DATA:  Pneumonia EXAM: PORTABLE CHEST 1 VIEW COMPARISON:  07/30/2017, 07/29/2017, 07/27/2017, 07/23/2017 FINDINGS: Tracheostomy tube remains in place. Esophageal tube tip is below the diaphragm but non included. Interval opacification of the right diaphragm. No change in dense left lung base consolidation. Probable tiny pleural effusions. Enlarged cardiomediastinal silhouette is unchanged. Right upper extremity catheter tip overlies the SVC. IMPRESSION: 1. Increased opacity at the right base may reflect atelectasis 2. No significant change in probable small left effusion and dense left lung base consolidation, atelectasis versus pneumonia. 3. Stable cardiomegaly Electronically Signed   By: Donavan Foil M.D.   On: 08/01/2017 02:56   Dg Chest Port 1 View  Result Date: 07/30/2017 CLINICAL DATA:  Pneumonia EXAM: PORTABLE CHEST 1 VIEW COMPARISON:  07/29/2017 and prior radiographs FINDINGS: A tracheostomy tube,  RIGHT PICC line with tip overlying the mid-LOWER SVC and endotracheal tube with tip overlying the distal esophagus again noted. Decreased LEFT LOWER lung consolidation/atelectasis noted. Mild RIGHT basilar atelectasis again noted. There is no evidence of pneumothorax. No other interval changes identified. IMPRESSION: 1. Decreased LEFT LOWER lung consolidation/atelectasis. 2. NG tube with tip overlying the distal esophagus-recommend advancement. Electronically Signed   By: Margarette Canada M.D.   On: 07/30/2017 07:49   Dg Chest Port 1 View  Result Date: 07/29/2017 CLINICAL DATA:  Acute on chronic respiratory failure. EXAM: PORTABLE CHEST 1 VIEW COMPARISON:  Radiograph July 27, 2017. FINDINGS: Stable cardiomegaly. Tracheostomy is in grossly  good position. Distal tip of nasogastric tube is seen in distal esophagus. No pneumothorax is noted. Right-sided PICC line is noted with distal tip in expected position of SVC. Mild bibasilar subsegmental atelectasis is noted. Bony thorax is unremarkable. IMPRESSION: Mild bibasilar subsegmental atelectasis. Endotracheal tube in grossly good position. Distal tip of nasogastric tube has withdrawn into distal esophagus. Electronically Signed   By: Marijo Conception, M.D.   On: 07/29/2017 22:01   Dg Chest Port 1 View  Result Date: 07/27/2017 CLINICAL DATA:  Excessive fluid EXAM: PORTABLE CHEST 1 VIEW COMPARISON:  Chest x-ray of July 23, 2017 FINDINGS: The lungs are well-expanded. There is a trace of pleural fluid on the right and slightly more on the left. The retrocardiac region remains dense. The cardiac silhouette is enlarged. The central pulmonary vascularity is prominent. The mediastinum is normal in width. The trachea is midline. The right-sided PICC line tip projects over the midportion of the SVC. The tracheostomy tube tip projects at the superior margin of the clavicular heads. The bony thorax exhibits no acute abnormality. IMPRESSION: Slight improved aeration of both lungs  today. Small amounts of pleural fluid at both bases. Persistent atelectasis or pneumonia in the left lower lobe. Top-normal cardiac size with central pulmonary vascular congestion but no definite pulmonary edema. Electronically Signed   By: David  Martinique M.D.   On: 07/27/2017 10:55   Dg Chest Port 1 View  Result Date: 07/23/2017 CLINICAL DATA:  Acute on chronic respiratory failure. Airway aspiration. EXAM: PORTABLE CHEST 1 VIEW COMPARISON:  07/22/2017. FINDINGS: Interval enlargement of the cardiac silhouette. Stable dense left lower lobe airspace opacity. Interval increased linear and patchy density at the right lung base. Stable small bilateral pleural effusions. Nasogastric tube extending into the stomach. Thoracic spine degenerative changes. IMPRESSION: 1. No significant change in dense left lower lobe atelectasis or pneumonia and small left pleural effusion. 2. Increased right basilar atelectasis or pneumonia with a stable small right pleural effusion. Electronically Signed   By: Claudie Revering M.D.   On: 07/23/2017 16:09   Dg Chest Port 1 View  Result Date: 07/22/2017 CLINICAL DATA:  Respiratory failure.  COPD. EXAM: PORTABLE CHEST 1 VIEW COMPARISON:  07/19/2017. FINDINGS: The cardiac silhouette remains borderline enlarged. Stable left pleural effusion and left basilar airspace opacity. Small right pleural effusion, decreased, with decreased adjacent right basilar atelectasis. Thoracic spine degenerative changes. IMPRESSION: 1. Stable left pleural effusion and left lower lobe atelectasis or pneumonia. 2. Decreased right pleural fluid and right basilar atelectasis. Electronically Signed   By: Claudie Revering M.D.   On: 07/22/2017 09:14   Dg Chest Port 1 View  Result Date: 07/19/2017 CLINICAL DATA:  Respiratory failure EXAM: PORTABLE CHEST 1 VIEW COMPARISON:  July 15, 2017 FINDINGS: There are small pleural effusions bilaterally. There is consolidation in the left lower lobe. Lungs elsewhere are clear.  Heart size is upper normal with pulmonary vascularity within normal limits. No adenopathy. There is degenerative change in the thoracic spine. IMPRESSION: Left lower lobe consolidation with small pleural effusions bilaterally. There is mild bibasilar atelectasis. Stable cardiac silhouette. Electronically Signed   By: Lowella Grip III M.D.   On: 07/19/2017 07:57   Dg Chest Port 1 View  Result Date: 07/15/2017 CLINICAL DATA:  Acute respiratory failure. EXAM: PORTABLE CHEST 1 VIEW COMPARISON:  Chest x-ray from yesterday FINDINGS: The heart size and mediastinal contours are within normal limits. Normal pulmonary vascularity. Unchanged left greater than right bibasilar atelectasis and scarring at the costophrenic angles. No  focal consolidation, pleural effusion, or pneumothorax. No acute osseous abnormality. IMPRESSION: Stable bibasilar atelectasis/scarring. Electronically Signed   By: Titus Dubin M.D.   On: 07/15/2017 10:41   Dg Chest Portable 1 View  Result Date: 07/14/2017 CLINICAL DATA:  Shortness of breath. EXAM: PORTABLE CHEST 1 VIEW COMPARISON:  06/05/2017. FINDINGS: Mediastinum and hilar structures normal. Stable mild cardiomegaly with normal pulmonary vascularity. Low lung volumes with mild basilar atelectasis. Stable bilateral pleural thickening consistent scarring. Degenerative changes thoracic spine. IMPRESSION: Low lung volumes with mild basilar atelectasis and/or pleuroparenchymal scarring again noted. Stable mild cardiomegaly. Chest is stable from prior exam. Electronically Signed   By: Marcello Moores  Register   On: 07/14/2017 17:13   Ct Renal Stone Study  Result Date: 07/04/2017 CLINICAL DATA:  62 year old female with blood on pad. Question rectal bleeding, vaginal bleeding or blood in urine. Initial encounter. EXAM: CT ABDOMEN AND PELVIS WITHOUT CONTRAST TECHNIQUE: Multidetector CT imaging of the abdomen and pelvis was performed following the standard protocol without IV contrast.  COMPARISON:  06/05/2017 CT. FINDINGS: Lower chest: Bibasilar atelectasis greater left once again noted. Hepatobiliary: Multiple gallstones. No CT evidence of surrounding inflammation. If patient had right upper quadrant tenderness, ultrasound can be obtained for further delineation. Taking into account limitation by non contrast imaging, no worrisome liver lesion. Pancreas: Taking into account limitation by non contrast imaging, no worrisome pancreatic lesion or inflammation. Spleen: Taking into account limitation by non contrast imaging, no worrisome splenic lesion or enlargement. Adrenals/Urinary Tract: When compared to the recent CT, there is mild fullness of the right renal collecting system without obstructing stone identified. This may be related to recently passed stone. Bilateral tiny/small nonobstructing renal calculi are noted (largest left lower pole measures 3.5 mm). Taking into account limitation by non contrast imaging, no worrisome renal or adrenal lesion. Noncontrast filled views of the urinary bladder unremarkable. Stomach/Bowel: Moderate stool rectosigmoid region. Superior extension sigmoid colon. No extraluminal bowel inflammatory process noted. Under distended stomach without gross abnormality. Tiny lipoma third portion of the duodenum. Vascular/Lymphatic: Aortic and iliac artery calcification. No aneurysm. No adenopathy. Reproductive: Retroverted uterus.  No adnexal mass. Other: No free intraperitoneal air or bowel containing hernia. Musculoskeletal: No worrisome osseous abnormality. IMPRESSION: When compared to recent CT, there is mild fullness of the right renal collecting system without obstructing stone identified. This may be related to recently passed stone or blood clot. Bilateral tiny/small nonobstructing renal calculi. Multiple gallstones. Bibasilar atelectasis greater left once again noted. Moderate stool rectosigmoid region. Superior extension sigmoid colon. No extraluminal bowel  inflammatory process noted. Tiny lipoma third portion of the duodenum. Aortic Atherosclerosis (ICD10-I70.0). Electronically Signed   By: Genia Del M.D.   On: 07/04/2017 08:03   [place] Ekg Site Rite  Result Date: 07/25/2017 If Site Rite image not attached, placement could not be confirmed due to current cardiac rhythm.    CBC Recent Labs  Lab 07/30/17 0459 08/01/17 0436 08/01/17 2342 08/02/17 0507  WBC 12.8* 9.3  --  13.4*  HGB 7.5* 7.1* 9.5* 9.2*  HCT 23.2* 22.7*  --  28.7*  PLT 353 346  --  322  MCV 90.9 92.2  --  90.9  MCH 29.2 28.9  --  29.2  MCHC 32.1 31.3*  --  32.2  RDW 17.2* 16.8*  --  16.2*  LYMPHSABS 0.7* 0.7*  --  0.7*  MONOABS 0.7 0.5  --  0.7  EOSABS 0.2 0.2  --  0.2  BASOSABS 0.0 0.0  --  0.0  Chemistries  Recent Labs  Lab 07/27/17 0401 07/28/17 0519 07/29/17 0808 07/30/17 0459 08/01/17 0436 08/02/17 0507  NA 138 135 135  --  139 139  K 3.9 3.9 3.6 3.8 3.8 3.7  CL 92* 92* 94*  --  100* 99*  CO2 38* 33* 32  --  34* 32  GLUCOSE 112* 138* 120*  --  120* 113*  BUN 12 14 15   --  16 16  CREATININE <0.30* <0.30* <0.30*  --  <0.30* <0.30*  CALCIUM 8.8* 8.8* 8.4*  --  8.9 9.1  MG  --  1.6* 1.8 1.7 1.7 1.5*   ------------------------------------------------------------------------------------------------------------------ CrCl cannot be calculated (This lab value cannot be used to calculate CrCl because it is not a number: <0.30). ------------------------------------------------------------------------------------------------------------------ No results for input(s): HGBA1C in the last 72 hours. ------------------------------------------------------------------------------------------------------------------ No results for input(s): CHOL, HDL, LDLCALC, TRIG, CHOLHDL, LDLDIRECT in the last 72 hours. ------------------------------------------------------------------------------------------------------------------ No results for input(s): TSH, T4TOTAL, T3FREE,  THYROIDAB in the last 72 hours.  Invalid input(s): FREET3 ------------------------------------------------------------------------------------------------------------------ No results for input(s): VITAMINB12, FOLATE, FERRITIN, TIBC, IRON, RETICCTPCT in the last 72 hours.  Coagulation profile No results for input(s): INR, PROTIME in the last 168 hours.  No results for input(s): DDIMER in the last 72 hours.  Cardiac Enzymes No results for input(s): CKMB, TROPONINI, MYOGLOBIN in the last 168 hours.  Invalid input(s): CK ------------------------------------------------------------------------------------------------------------------ Invalid input(s): POCBNP    Assessment & Plan   62 year old female patient with history of chronic respiratory failure, GI bleed currently in the hospital for acute on chronic hypercapnic respiratory failure and anemia secondary to slow GI bleed.  1.Acute on chronic hypercapnic respiratory failure S/p tracheostomy on ventilator support Impaired ventilatory drive and global muscle weakness Appreciate intensivist management.  Continue weaning trial on pressure support as tolerated.  2. Moderate malnutrition  Feeding supplements  3.  Moderate malnutrition   Continue NG tube feeding, appreciate GI following.,waiting for swallow eval 4. Electrolyte imbalance F/u magnesium and potassium  5.  Acute on chronic gastrointestinal bleeding s/p EGD 3/18 showing Normal duodenal bulb and second portion of the duodenum.Duodenal lipoma. Non-bleeding gastric ulcer with a clean ulcer base (Forrest Class III).Normal gastroesophageal junction and esophagus. - protonix 40 mg IV bid - 6. DVT prophylaxis with SCD  7. Hypomagnesemia : Replace magnesium  8.  Management follow-up for possible LTAC placement  9. euthyroid sick syndrome.        Code Status Orders  (From admission, onward)        Start     Ordered   07/14/17 2115  Full code  Continuous      07/14/17 2122    Code Status History    Date Active Date Inactive Code Status Order ID Comments User Context   06/05/2017 1654 06/08/2017 1820 Full Code [id number]  Bettey Costa, MD Inpatient   11/01/2016 0121 11/09/2016 2044 Full Code [id number]  Lance Coon, MD Inpatient       Time Spent in minutes   35 minutes  Greater than 50% of time spent in care coordination and counseling patient regarding the condition and plan of care.   Epifanio Lesches M.D on 08/02/2017 at 2:54 PM  Between 7am to 6pm - Pager - [phone]  After 6pm go to [email or web address] - Proofreader  [employer]   Office  [phone]

## 2017-08-02 NOTE — Progress Notes (Signed)
Pharmacy Antibiotic Note  Kayla Boyer is a 62 y.o. female admitted on 07/14/2017 with pneumonia.  Pharmacy has been consulted for Zosyn dosing to cover possible HCAP.   Plan: Will initiate Zosyn EI 3.375g IV Q8hr. Procalcitonin is pending.   Height: 5' 2.5" (158.8 cm) Weight: 126 lb 8.7 oz (57.4 kg) IBW/kg (Calculated) : 51.25  Temp (24hrs), Avg:99.1 F (37.3 C), Min:98.8 F (37.1 C), Max:99.3 F (37.4 C)  Recent Labs  Lab 07/27/17 0401 07/28/17 0519 07/29/17 0808 07/30/17 0459 08/01/17 0436 08/02/17 0507  WBC  --   --   --  12.8* 9.3 13.4*  CREATININE <0.30* <0.30* <0.30*  --  <0.30* <0.30*    CrCl cannot be calculated (This lab value cannot be used to calculate CrCl because it is not a number: <0.30).    No Known Allergies  Antimicrobials this admission: Zosyn 4/3 >>   Dose adjustments this admission: N/A  Microbiology results: 4/3 Sputum: pending  3/16 MRSA PCR: negative   Thank you for allowing pharmacy to be a part of this patient's care.  Adaly Puder L 08/02/2017 4:40 PM

## 2017-08-02 NOTE — Progress Notes (Signed)
Results for Kayla Boyer, Kayla Boyer (MRN [id number]) as of 08/02/2017 00:52  Ref. Range 08/01/2017 23:42  Hemoglobin Latest Ref Range: 12.0 - 16.0 g/dL 9.5 (L)   Notified Hinton Dyer, NP

## 2017-08-02 NOTE — Care Management (Addendum)
One barrier to placement at a facilty is feeding access (PEG) however per RN goal is to wean from vent before placing a PEG (if needed) pending SLP.

## 2017-08-02 NOTE — Progress Notes (Signed)
   Name: Kayla Boyer MRN: [id number] DOB: 29-Oct-1955     CONSULTATION DATE: 07/14/2017  REFERRING MD :    Remains on vent, poor progress on vent weaning   PAST MEDICAL HISTORY :   has a past medical history of Emphysema of lung ([place]), GI bleed, and HLD (hyperlipidemia).  has a past surgical history that includes No past surgeries; Oophorectomy (Left); Esophagogastroduodenoscopy (egd) with propofol (N/A, 07/17/2017); and Tracheostomy tube placement (N/A, 07/27/2017). Prior to Admission medications   Medication Sig Start Date End Date Taking? Authorizing Provider  feeding supplement, ENSURE ENLIVE, (ENSURE ENLIVE) LIQD Take 237 mLs by mouth 2 (two) times daily between meals. 06/08/17   Vaughan Basta, MD  ferrous sulfate 325 (65 FE) MG tablet Take 1 tablet (325 mg total) by mouth 2 (two) times daily with a meal. 06/08/17   Vaughan Basta, MD  pantoprazole (PROTONIX) 40 MG tablet Take 1 tablet (40 mg total) by mouth 2 (two) times daily before a meal. 06/08/17   Vaughan Basta, MD  vitamin B-12 (CYANOCOBALAMIN) 1000 MCG tablet Take 1,000 mcg by mouth daily.    [provider]  vitamin C (ASCORBIC ACID) 500 MG tablet Take 500 mg by mouth daily.    [provider]   No Known Allergies  FAMILY HISTORY:  Family history is unknown by patient. SOCIAL HISTORY:  reports that she has never smoked. She has never used smokeless tobacco. She reports that she does not drink alcohol or use drugs.  REVIEW OF SYSTEMS:   Unable to obtain due to critical illness   VITAL SIGNS: Temp:  [98.8 F (37.1 C)-99.3 F (37.4 C)] 99 F (37.2 C) (04/03 1200) Pulse Rate:  [92-113] 92 (04/03 1400) Resp:  [12-26] 13 (04/03 1400) BP: (96-159)/(64-94) 99/69 (04/03 1400) SpO2:  [93 %-100 %] 99 % (04/03 1514) FiO2 (%):  [28 %] 28 % (04/03 1514) Weight:  [56.5 kg (124 lb 9 oz)-57.4 kg (126 lb 8.7 oz)] 57.4 kg (126 lb 8.7 oz) (04/03 0528)  Physical Examination:  Awake and oriented  and nonfocal neurological exam Tracheostomy in place, toleratingCPAP/PS,distress, BEAE no rales. Minimal clear secretions S1 + S2 audible and no murmur Benign abdominal examwith feebleperistalsis. Tolerating tube feed No leg edema  ASSESSMENT / PLAN:  Acute on chronic respiratory failure.Tracheostomy was placed on 3/28/201. Tolerating CPAP/PS. -Vent and trach weaning as tolerated  GI bleeding with history of chronic gastric ulcer -PPI management as per GI.Status post EGD,colonoscopy is a consideration if he continues to have GI bleeding as per GI  Atelectasis. Worsening Lt. L  airspace disease, Leukocytosis -Respiratory care, with Neb and Mucolytics. Start on Zosyn. Monitor CXR + CBC + FiO2. MRSA PCR -ve.   Dysphagia -As per GI patient was able to swallow her secretions and recommendation to repeat swallow evaluation in the next 48 hours. -ConsiderFeeding tube placement ifcontinue to have dysphagiaor unable to wean of vent by next week.  Constipation -Bowel regimen  Depression -Remeron as per psych.  Anemia with HB has been trending down from 9.8 - 7.1 over the last week. -Transfuse 1 RBC and keep Hb > 7 gm/dl.  Full code  DVT+GI prophylaxis. Continue with supportive care.  Critical care time 35 min

## 2017-08-02 NOTE — Progress Notes (Signed)
Daily Progress Note   Patient Name: Kayla Boyer       Date: 08/02/2017 DOB: 1955/08/08  Age: 62 y.o. MRN#: [id number] Attending Physician: Epifanio Lesches, MD Primary Care Physician: Patient, [name] Admit Date: 07/14/2017  Reason for Consultation/Follow-up: Establishing goals of care  Subjective: Patient awake and alert. Trach/weaning on vent. Denies dyspnea or pain. Patient can mouth words but mostly communicates by writing.   Discussed course of hospitalization and attempt to wean off ventilator. Explained that if she cannot wean from vent and safe for SLP evaluation, she will need a feeding tube. Patient nods her head yes.   She wishes to sit on the side of bed with physical therapy. She tells me they have been working with her. I explained that SW is following for disposition plan to facility because she will require a lot of assist upon discharge.   I explored patient's thoughts on current plan of care. She writes of her deep faith in [place] and that he is with her now and will be with her when she dies but "would rather live to proclaim his name." She appreciates me doing my "job" but writing that we are not in control and "the [place] told me everything would be ok."   Emotional/spiritual support provided.   Length of Stay: 19  Current Medications: Scheduled Meds:  . acetylcysteine  3 mL Nebulization TID  . chlorhexidine  15 mL Mouth Rinse BID  . sennosides  5 mL Per Tube BID   And  . docusate  50 mg Per Tube BID  . free water  200 mL Per Tube Q6H  . insulin aspart  0-9 Units Subcutaneous Q6H  . ipratropium-albuterol  3 mL Nebulization Q8H  . liver oil-zinc oxide   Topical Daily  . mouth rinse  15 mL Mouth Rinse QID  . mirtazapine  15 mg Oral QHS  . multivitamin  15 mL  Per Tube Daily  . pantoprazole sodium  40 mg Per Tube BID  . sodium chloride flush  10-40 mL Intracatheter Q12H  . thiamine  100 mg Per Tube Daily  . thyroid  30 mg Per Tube QAC breakfast    Continuous Infusions: . dextrose 25 mL/hr at 08/02/17 0800  . feeding supplement (VITAL HIGH PROTEIN) 50 mL/hr at 08/02/17 0800  PRN Meds: acetaminophen (TYLENOL) oral liquid 160 mg/5 mL, acetaminophen **OR** acetaminophen, ALPRAZolam, bisacodyl, metoprolol tartrate, morphine injection, [DISCONTINUED] ondansetron **OR** ondansetron (ZOFRAN) IV, simethicone, sodium chloride flush  Physical Exam  Constitutional: She is oriented to person, place, and time. She is cooperative.  HENT:  Head: Normocephalic and atraumatic.  Cardiovascular: Regular rhythm.  Pulmonary/Chest: No accessory muscle usage. No tachypnea. No respiratory distress.  Trach/vent. Currently weaning  Neurological: She is alert and oriented to person, place, and time.  Skin: Skin is warm and dry.  Psychiatric: She has a normal mood and affect. Her behavior is normal. Cognition and memory are normal.  Nursing note and vitals reviewed.          Vital Signs: BP 115/76 (BP Location: Right Arm)   Pulse 99   Temp 99 F (37.2 C) (Oral)   Resp 12   Ht 5' 2.5" (1.588 m)   Wt 57.4 kg (126 lb 8.7 oz)   SpO2 100%   BMI 22.78 kg/m  SpO2: SpO2: 100 % O2 Device: O2 Device: Ventilator O2 Flow Rate: O2 Flow Rate (L/min): 2 L/min  Intake/output summary:   Intake/Output Summary (Last 24 hours) at 08/02/2017 1319 Last data filed at 08/02/2017 1217 Gross per 24 hour  Intake 2479.17 ml  Output 1720 ml  Net 759.17 ml   LBM: Last BM Date: 07/30/17 Baseline Weight: Weight: 60.8 kg (134 lb) Most recent weight: Weight: 57.4 kg (126 lb 8.7 oz)       Palliative Assessment/Data: PPS 30%   Flowsheet Rows     Most Recent Value  Intake Tab  Referral Department  Hospitalist  Unit at Time of Referral  Med/Surg Unit  Palliative Care Primary  Diagnosis  Pulmonary  Date Notified  07/19/17  Palliative Care Type  New Palliative care  Reason for referral  Clarify Goals of Care  Date of Admission  07/14/17  Date first seen by Palliative Care  07/21/17  # of days Palliative referral response time  2 Day(s)  # of days IP prior to Palliative referral  5  Clinical Assessment  Psychosocial & Spiritual Assessment  Palliative Care Outcomes      Patient Active Problem List   Diagnosis Date Noted  . Major depressive disorder, single episode, severe without psychotic features ([place]) 07/30/2017  . Acute on chronic respiratory failure with hypercapnia ([place])   . Protein-calorie malnutrition ([place])   . At high risk for aspiration   . Palliative care by specialist   . Acute respiratory failure ([place])   . Goals of care, counseling/discussion   . Palliative care encounter   . Chronic gastric ulcer without hemorrhage and without perforation   . GI bleed 07/14/2017  . Anemia 06/05/2017  . Fall 10/31/2016  . Tachycardia 10/31/2016  . HLD (hyperlipidemia) 10/31/2016    Palliative Care Assessment & Plan   Patient Profile: 62 y.o. female  with past medical history of recent admission for GIB gastric ulcers, HLD, COPD admitted on 07/14/2017 with weakness and dark stools. Hospitalization complicated by continued reliance on BiPAP and impaired ventilation as well as GIB. PCCM questioning "low T3 syndrome." Per notes she has been wheelchair bound for ~3 years and with poor appetite and low functional status. CT head negative on 3/24. Unable to tolerate MRI. Patient remains on BiPAP.   Assessment: Acute on chronic hypercapnic respiratory failure secondary to impaired ventilatory drive/muscle weakness requiring trach/vent. Sick Euthyroid UGIB resolved Malnutrition Severe deconditioning ? CVA  Recommendations/Plan:  FULL code/FULL scope per patient/family wishes.  PCCM continuing to attempt vent wean. Likely will need PEG tube.  SW following  for SNF placement. Would benefit from outpatient palliative follow-up.   Goals of Care and Additional Recommendations:  Limitations on Scope of Treatment: Full Scope Treatment  Code Status: FULL   Code Status Orders  (From admission, onward)        Start     Ordered   07/14/17 2115  Full code  Continuous     07/14/17 2122    Code Status History    Date Active Date Inactive Code Status Order ID Comments User Context   06/05/2017 1654 06/08/2017 1820 Full Code [id number]  Bettey Costa, MD Inpatient   11/01/2016 0121 11/09/2016 2044 Full Code [id number]  Lance Coon, MD Inpatient       Prognosis:   Unable to determine   Discharge Planning:  To Be Determined  Care plan was discussed with patient, multidisciplinary team rounds  Thank you for allowing the Palliative Medicine Team to assist in the care of this patient.   Time In: 1230 Time Out: 1250 Total Time 1min Prolonged Time Billed no      Greater than 50%  of this time was spent counseling and coordinating care related to the above assessment and plan.  Ihor Dow, FNP-C Palliative Medicine Team  Phone: [phone] Fax: [phone]  Please contact Palliative Medicine Team phone at [phone] for questions and concerns.

## 2017-08-03 ENCOUNTER — Inpatient Hospital Stay: Payer: Medicaid Other

## 2017-08-03 LAB — BASIC METABOLIC PANEL
ANION GAP: 7 (ref 5–15)
BUN: 20 mg/dL (ref 6–20)
CALCIUM: 9 mg/dL (ref 8.9–10.3)
CO2: 33 mmol/L — ABNORMAL HIGH (ref 22–32)
CREATININE: 0.47 mg/dL (ref 0.44–1.00)
Chloride: 99 mmol/L — ABNORMAL LOW (ref 101–111)
GFR calc Af Amer: >60 mL/min (ref >60)
GLUCOSE: 118 mg/dL — AB (ref 65–99)
Potassium: 3.7 mmol/L (ref 3.5–5.1)
Sodium: 139 mmol/L (ref 135–145)

## 2017-08-03 LAB — BLOOD GAS, ARTERIAL
Acid-Base Excess: 10.6 mmol/L — ABNORMAL HIGH (ref 0.0–2.0)
Bicarbonate: 34.3 mmol/L — ABNORMAL HIGH (ref 20.0–28.0)
FIO2: 0.28
MECHVT: 450 mL
O2 SAT: 99.5 %
PATIENT TEMPERATURE: 37
PCO2 ART: 41 mmHg (ref 32.0–48.0)
PEEP: 5 cmH2O
PO2 ART: 154 mmHg — AB (ref 83.0–108.0)
RATE: 15 resp/min
pH, Arterial: 7.53 — ABNORMAL HIGH (ref 7.350–7.450)

## 2017-08-03 LAB — CBC WITH DIFFERENTIAL/PLATELET
BASOS PCT: 0 %
Basophils Absolute: 0 10*3/uL (ref 0–0.1)
Eosinophils Absolute: 0.1 10*3/uL (ref 0–0.7)
Eosinophils Relative: 1 %
HCT: 27.9 % — ABNORMAL LOW (ref 35.0–47.0)
Hemoglobin: 9 g/dL — ABNORMAL LOW (ref 12.0–16.0)
LYMPHS ABS: 0.6 10*3/uL — AB (ref 1.0–3.6)
Lymphocytes Relative: 6 %
MCH: 29.7 pg (ref 26.0–34.0)
MCHC: 32.4 g/dL (ref 32.0–36.0)
MCV: 91.7 fL (ref 80.0–100.0)
Monocytes Absolute: 0.6 10*3/uL (ref 0.2–0.9)
Monocytes Relative: 6 %
NEUTROS ABS: 8.7 10*3/uL — AB (ref 1.4–6.5)
Neutrophils Relative %: 87 %
PLATELETS: 268 10*3/uL (ref 150–440)
RBC: 3.04 MIL/uL — ABNORMAL LOW (ref 3.80–5.20)
RDW: 15.7 % — AB (ref 11.5–14.5)
WBC: 10 10*3/uL (ref 3.6–11.0)

## 2017-08-03 LAB — MAGNESIUM: Magnesium: 1.9 mg/dL (ref 1.7–2.4)

## 2017-08-03 LAB — GLUCOSE, CAPILLARY
GLUCOSE-CAPILLARY: 108 mg/dL — AB (ref 65–99)
GLUCOSE-CAPILLARY: 77 mg/dL (ref 65–99)
Glucose-Capillary: 108 mg/dL — ABNORMAL HIGH (ref 65–99)
Glucose-Capillary: 113 mg/dL — ABNORMAL HIGH (ref 65–99)
Glucose-Capillary: 88 mg/dL (ref 65–99)

## 2017-08-03 LAB — PHOSPHORUS: Phosphorus: 4.1 mg/dL (ref 2.5–4.6)

## 2017-08-03 LAB — CALCIUM, IONIZED: CALCIUM, IONIZED, SERUM: 5.3 mg/dL (ref 4.5–5.6)

## 2017-08-03 LAB — PROCALCITONIN: PROCALCITONIN: 0.13 ng/mL

## 2017-08-03 NOTE — Progress Notes (Signed)
PT Cancellation Note  Patient Details Name: Kayla Boyer MRN: [id number] DOB: August 16, 1955   Cancelled Treatment:    Reason Eval/Treat Not Completed: Medical issues which prohibited therapy. Treatment attempted; nursing in room and states pt O2 saturation too low at this time after bed rolling for bed change requiring increase in supplemental O2 and still in the low to mid 80s. Re attempt tomorrow.    Larae Grooms, PTA 08/03/2017, 5:07 PM

## 2017-08-03 NOTE — Progress Notes (Signed)
Patient continued on ventilator. Only tolerated pressure support ventilation for a little over an hour and then O2 saturations dropped to low 80s, ventilator alarmed multiple times with no patient effort, and patient requested to go back on a rate. Refused most mobility, let staff turn her side to side a few times per shift but always wanted to be left supine after. RN gave prn suppository as patient had not had a bowel movement for several days and reported feeling constipated. Patient had large BM and then medium sized BM.  In afternoon, patient de-sated to 80s after rolling so sheets could be changed. Amy RT at bedside and increased FiO2 to 50%, by 17:00 patient's o2 sats 100%.

## 2017-08-03 NOTE — Progress Notes (Signed)
   Name: Kayla Boyer MRN: [id number] DOB: 01-Jul-1955     CONSULTATION DATE: 07/14/2017 Has been started on Zosyn because of increased airspace disease and leukocytosis. She remains on vent and poor progress on vent weaning.  PAST MEDICAL HISTORY :   has a past medical history of Emphysema of lung ([place]), GI bleed, and HLD (hyperlipidemia).  has a past surgical history that includes No past surgeries; Oophorectomy (Left); Esophagogastroduodenoscopy (egd) with propofol (N/A, 07/17/2017); and Tracheostomy tube placement (N/A, 07/27/2017). Prior to Admission medications   Medication Sig Start Date End Date Taking? Authorizing Provider  feeding supplement, ENSURE ENLIVE, (ENSURE ENLIVE) LIQD Take 237 mLs by mouth 2 (two) times daily between meals. 06/08/17   Vaughan Basta, MD  ferrous sulfate 325 (65 FE) MG tablet Take 1 tablet (325 mg total) by mouth 2 (two) times daily with a meal. 06/08/17   Vaughan Basta, MD  pantoprazole (PROTONIX) 40 MG tablet Take 1 tablet (40 mg total) by mouth 2 (two) times daily before a meal. 06/08/17   Vaughan Basta, MD  vitamin B-12 (CYANOCOBALAMIN) 1000 MCG tablet Take 1,000 mcg by mouth daily.    [provider]  vitamin C (ASCORBIC ACID) 500 MG tablet Take 500 mg by mouth daily.    [provider]   No Known Allergies  FAMILY HISTORY:  Family history is unknown by patient. SOCIAL HISTORY:  reports that she has never smoked. She has never used smokeless tobacco. She reports that she does not drink alcohol or use drugs.  REVIEW OF SYSTEMS:   Unable to obtain due to critical illness   VITAL SIGNS: Temp:  [97.7 F (36.5 C)-99.1 F (37.3 C)] 97.7 F (36.5 C) (04/04 1231) Pulse Rate:  [82-107] 85 (04/04 1300) Resp:  [13-24] 15 (04/04 1300) BP: (88-144)/(61-113) 118/86 (04/04 1300) SpO2:  [92 %-100 %] 100 % (04/04 1300) FiO2 (%):  [21 %-28 %] 21 % (04/04 1300)  Physical Examination:   Awake and oriented and nonfocal  neurological exam Tracheostomy in place, BEAE no rales. Minimal clear secretions S1 + S2 audible and no murmur Benign abdominal examwith feebleperistalsis. Tolerating tube feed No leg edema  ASSESSMENT / PLAN:   Acute on chronic respiratory failure.Tracheostomy was placed on 3/28/201. Poor progress on vent weaning, didn't tolerate CPAP/PS because of increased work of breathing. -Vent and trach weaning as tolerated  GI bleeding with history of chronic gastric ulcer -PPI management as per GI.Status post EGD,colonoscopy is a consideration if he continues to have GI bleeding as per GI  Atelectasis and pneumonia (GPR in trach aspirate).improvedairspace disease, Leukocytosis -Respiratory care, with Neb and Mucolytics. C/W Zosyn. Monitor CXR + CBC + FiO2.MRSA PCR -ve.   Dysphagia -As per GI patient was able to swallow her secretions and recommendation to repeat swallow evaluation in the next 48 hours. -ConsiderFeeding tube placement ifcontinue to have dysphagiaor unable to wean of vent by next week.  Constipation -Bowel regimen  Depression -Remeron as per psych.  Anemia with HB has been trending down from 9.8 - 7.1 over the last week. -Transfuse 1 RBC and keep Hb > 7 gm/dl.  Full code  DVT+GI prophylaxis. Continue with supportive care.  Critical care time 35 min

## 2017-08-03 NOTE — Progress Notes (Signed)
[place] at [facility]                                                                                                                                                                                  Patient Demographics   Kayla Boyer, is a 62 y.o. female, DOB - 04/05/1956, IOE:[id number]  Admit date - 07/14/2017   Admitting Physician Saundra Shelling, MD  Outpatient Primary MD for the patient is Patient, [name]   LOS - 20  Is on full vent support.  Unable to wean because of respiratory distress  Review of Systems:  On ventilator support via tracheostomy, patient feels short of breath and states that she could not pretend she is on pressure support.,  Told respiratory therapist  about that Opens eyes and communicate some verbal commands Complete review of systems not possible  Vitals:   Vitals:   08/03/17 1100 08/03/17 1200 08/03/17 1231 08/03/17 1300  BP: (!) 144/111 105/79  118/86  Pulse: 95 86  85  Resp: 14 15  15   Temp:   97.7 F (36.5 C)   TempSrc:   Oral   SpO2: 97% 100%  100%  Weight:      Height:        Wt Readings from Last 3 Encounters:  08/02/17 57.4 kg (126 lb 8.7 oz)  07/05/17 60.8 kg (134 lb)  07/04/17 60.8 kg (134 lb)     Intake/Output Summary (Last 24 hours) at 08/03/2017 1406 Last data filed at 08/03/2017 1200 Gross per 24 hour  Intake 2175 ml  Output 1050 ml  Net 1125 ml    Physical Exam:   GENERAL: 62 year old female patient lying on the bed in the stepdown unit on BiPAP HEAD, EYES, EARS, NOSE AND THROAT: Atraumatic, normocephalic. Extraocular muscles are intact. Pupils equal and reactive to light. Sclerae anicteric. No conjunctival injection. No oro-pharyngeal erythema.  NECK: Supple. There is no jugular venous distention. No bruits, no lymphadenopathy, no thyromegaly.  Patient has trach in the neck.   HEART: Regular rate and rhythm,. No murmurs, no rubs, no clicks.  LUNGS: Clear to auscultation bilaterally. No rales  or rhonchi. No wheezes.  On ventilator support via tracheostomy ABDOMEN: Soft, flat, nontender, nondistended. Has good bowel sounds. No hepatosplenomegaly appreciated.  EXTREMITIES: No evidence of any cyanosis, clubbing.  +2 pedal and radial pulses bilaterally.  1+ pedal edema NEUROLOGIC: The patient is alert, arousable, and oriented x2 with   Decreased hearing, left arm weakness improved Right pupil greater than left pupil SKIN: Moist and warm with no rashes appreciated.  Psych: Not be assessed LN: No inguinal LN enlargement    Antibiotics  Anti-infectives (From admission, onward)   Start     Dose/Rate Route Frequency Ordered Stop   08/03/17 0100  piperacillin-tazobactam (ZOSYN) IVPB 3.375 g  Status:  Discontinued     3.375 g 12.5 mL/hr over 240 Minutes Intravenous Every 8 hours 08/02/17 1701 08/02/17 1704   08/02/17 1730  piperacillin-tazobactam (ZOSYN) IVPB 3.375 g     3.375 g 12.5 mL/hr over 240 Minutes Intravenous Every 8 hours 08/02/17 1704     08/02/17 1645  piperacillin-tazobactam (ZOSYN) IVPB 3.375 g  Status:  Discontinued     3.375 g 12.5 mL/hr over 240 Minutes Intravenous Every 8 hours 08/02/17 1639 08/02/17 1701   07/14/17 2200  azithromycin (ZITHROMAX) 500 mg in sodium chloride 0.9 % 250 mL IVPB  Status:  Discontinued     500 mg 250 mL/hr over 60 Minutes Intravenous Every 24 hours 07/14/17 2122 07/18/17 0915      Medications   Scheduled Meds: . acetylcysteine  3 mL Nebulization BID  . chlorhexidine  15 mL Mouth Rinse BID  . sennosides  5 mL Per Tube BID   And  . docusate  50 mg Per Tube BID  . free water  200 mL Per Tube Q6H  . insulin aspart  0-9 Units Subcutaneous Q6H  . ipratropium-albuterol  3 mL Nebulization TID  . liver oil-zinc oxide   Topical Daily  . mouth rinse  15 mL Mouth Rinse QID  . mirtazapine  15 mg Oral QHS  . multivitamin  15 mL Per Tube Daily  . pantoprazole sodium  40 mg Per Tube BID  . sodium chloride flush  10-40 mL Intracatheter  Q12H  . thiamine  100 mg Per Tube Daily  . thyroid  30 mg Per Tube QAC breakfast   Continuous Infusions: . dextrose 25 mL/hr at 08/03/17 0400  . feeding supplement (VITAL HIGH PROTEIN) 50 mL/hr at 08/03/17 0400  . piperacillin-tazobactam (ZOSYN)  IV Stopped (08/03/17 1300)   PRN Meds:.acetaminophen (TYLENOL) oral liquid 160 mg/5 mL, acetaminophen **OR** acetaminophen, ALPRAZolam, bisacodyl, metoprolol tartrate, morphine injection, [DISCONTINUED] ondansetron **OR** ondansetron (ZOFRAN) IV, simethicone, sodium chloride flush   Data Review:   Micro Results Recent Results (from the past 240 hour(s))  Culture, respiratory (NON-Expectorated)     Status: None (Preliminary result)   Collection Time: 08/02/17  6:23 PM  Result Value Ref Range Status   Specimen Description   Final    TRACHEAL ASPIRATE Performed at [facility], [address], [place], [place] [address]    Special Requests   Final    NONE Performed at [facility], [address], [place], [place] [address]    Gram Stain   Final    ABUNDANT WBC PRESENT, PREDOMINANTLY PMN FEW GRAM POSITIVE RODS Performed at [facility], [place] [address] [place],  [address]    Culture PENDING  Incomplete   Report Status PENDING  Incomplete    Radiology Reports Dg Abd 1 View  Result Date: 07/31/2017 CLINICAL DATA:  Nasogastric tube placement. EXAM: ABDOMEN - 1 VIEW COMPARISON:  Radiograph July 22, 2017. FINDINGS: The bowel gas pattern is normal. Distal tip of feeding tube is seen in distal stomach. No radio-opaque calculi or other significant radiographic abnormality are seen. IMPRESSION: Distal tip of nasogastric tube seen in distal stomach. No evidence of bowel obstruction or ileus. Electronically Signed   By: Marijo Conception, M.D.   On: 07/31/2017 11:47   Dg Abd 1 View  Result Date: 07/22/2017 CLINICAL DATA:  Encounter for feeding tube placement. EXAM: ABDOMEN - 1 VIEW COMPARISON:  Chest radiograph  07/22/2017 FINDINGS: A nasogastric tube has been advanced into the abdomen. Catheter tip is in the distal stomach region. Bowel gas throughout the abdomen. Persistent densities at the left lung base and the left hemidiaphragm remains obscured. IMPRESSION: Feeding tube tip in the distal stomach region. Electronically Signed   By: Markus Daft M.D.   On: 07/22/2017 16:30   Ct Head Wo Contrast  Result Date: 07/23/2017 CLINICAL DATA:  62 y/o F; changes to left pupil in strength of left arm. EXAM: CT HEAD WITHOUT CONTRAST TECHNIQUE: Contiguous axial images were obtained from the base of the skull through the vertex without intravenous contrast. COMPARISON:  None. FINDINGS: Brain: No evidence of acute infarction, hemorrhage, hydrocephalus, extra-axial collection or mass lesion/mass effect. Vascular: Calcific atherosclerosis of carotid siphons. No hyperdense vessel. Skull: Normal. Negative for fracture or focal lesion. Sinuses/Orbits: No acute finding. Other: None. IMPRESSION: Negative CT of the head. Electronically Signed   By: Kristine Garbe M.D.   On: 07/23/2017 14:33   Nm Gi Blood Loss  Result Date: 07/20/2017 CLINICAL DATA:  GI bleeding EXAM: NUCLEAR MEDICINE GASTROINTESTINAL BLEEDING SCAN TECHNIQUE: Sequential abdominal images were obtained following intravenous administration of Tc-71m labeled red blood cells. RADIOPHARMACEUTICALS:  Twenty mCi Tc-25m pertechnetate in-vitro labeled red cells. COMPARISON:  None. FINDINGS: Normal distribution of radiotracer is noted. No focal area of tracer accumulation is identified to suggest active GI bleeding is noted. IMPRESSION: No findings to suggest active GI hemorrhage. Electronically Signed   By: Inez Catalina M.D.   On: 07/20/2017 15:16   US Venous Img Upper Bilat  Result Date: 07/16/2017 CLINICAL DATA:  Bilateral upper extremity edema acutely with pain and discomfort for 1 day EXAM: BILATERAL UPPER EXTREMITY VENOUS DOPPLER ULTRASOUND TECHNIQUE: Gray-scale  sonography with graded compression, as well as color Doppler and duplex ultrasound were performed to evaluate the bilateral upper extremity deep venous systems from the level of the subclavian vein and including the jugular, axillary, basilic, radial, ulnar and upper cephalic vein. Spectral Doppler was utilized to evaluate flow at rest and with distal augmentation maneuvers. COMPARISON:  None. FINDINGS: Exam is limited because of upper extremity subcutaneous edema. RIGHT UPPER EXTREMITY Internal Jugular Vein: No evidence of thrombus. Normal compressibility, respiratory phasicity and response to augmentation. Subclavian Vein: No evidence of thrombus. Normal compressibility, respiratory phasicity and response to augmentation. Axillary Vein: No evidence of thrombus. Normal compressibility, respiratory phasicity and response to augmentation. Cephalic Vein: No evidence of thrombus. Normal compressibility, respiratory phasicity and response to augmentation. Basilic Vein: Not well visualized to accurately assess. Brachial Veins: No evidence of thrombus. Normal compressibility, respiratory phasicity and response to augmentation. Radial Veins: No evidence of thrombus. Normal compressibility, respiratory phasicity and response to augmentation. Ulnar Veins: No evidence of thrombus. Normal compressibility, respiratory phasicity and response to augmentation. Venous Reflux:  None. Other Findings:  Peripheral edema noted LEFT UPPER EXTREMITY Internal Jugular Vein: No evidence of thrombus. Normal compressibility, respiratory phasicity and response to augmentation. Subclavian Vein: No evidence of thrombus. Normal compressibility, respiratory phasicity and response to augmentation. Axillary Vein: No evidence of thrombus. Normal compressibility, respiratory phasicity and response to augmentation. Cephalic Vein: No evidence of thrombus. Normal compressibility, respiratory phasicity and response to augmentation. Basilic Vein: No  evidence of thrombus. Normal compressibility, respiratory phasicity and response to augmentation. Brachial Veins: No evidence of thrombus. Normal compressibility, respiratory phasicity and response to augmentation. Radial Veins: No evidence of thrombus. Normal compressibility, respiratory  phasicity and response to augmentation. Ulnar Veins: No evidence of thrombus. Normal compressibility, respiratory phasicity and response to augmentation. Venous Reflux:  None. Other Findings:  But peripheral subcutaneous edema noted. IMPRESSION: Limited exam but no gross occlusive upper extremity DVT in either arm. Electronically Signed   By: Jerilynn Mages.  Shick M.D.   On: 07/16/2017 12:34   US Venous Img Upper Uni Left  Result Date: 07/25/2017 CLINICAL DATA:  Left upper extremity edema. EXAM: LEFT UPPER EXTREMITY VENOUS DOPPLER ULTRASOUND TECHNIQUE: Gray-scale sonography with graded compression, as well as color Doppler and duplex ultrasound were performed to evaluate the upper extremity deep venous system from the level of the subclavian vein and including the jugular, axillary, basilic, radial, ulnar and upper cephalic vein. Spectral Doppler was utilized to evaluate flow at rest and with distal augmentation maneuvers. COMPARISON:  None. FINDINGS: Contralateral Subclavian Vein: Respiratory phasicity is normal and symmetric with the symptomatic side. No evidence of thrombus. Normal compressibility. Internal Jugular Vein: No evidence of thrombus. Normal compressibility, respiratory phasicity and response to augmentation. Subclavian Vein: No evidence of thrombus. Normal compressibility, respiratory phasicity and response to augmentation. Axillary Vein: No evidence of thrombus. Normal compressibility, respiratory phasicity and response to augmentation. Cephalic Vein: No evidence of thrombus. Normal compressibility, respiratory phasicity and response to augmentation. Basilic Vein: No evidence of thrombus. Normal compressibility, respiratory  phasicity and response to augmentation. Brachial Veins: No evidence of thrombus. Normal compressibility, respiratory phasicity and response to augmentation. Radial Veins: No evidence of thrombus. Normal compressibility, respiratory phasicity and response to augmentation. Ulnar Veins: No evidence of thrombus. Normal compressibility, respiratory phasicity and response to augmentation. Venous Reflux:  None visualized. Other Findings: There is significant edema noted in the left upper arm including some more locally marginated fluid in the subcutaneous tissues of the left medial upper arm. This fluid appears to extend over a long distance and likely represents subcutaneous fluid which is more prominent in 1 area of the upper arm. If there is any concern for soft tissue infection, additional imaging may be warranted. IMPRESSION: 1. No evidence of left upper extremity deep venous thrombosis. 2. Significant subcutaneous fluid in the left upper arm with an area of more marginated subcutaneous fluid also identified in the medial left upper arm. Although this does not have the appearance of a focal abscess by ultrasound, if there is concern for upper extremity infection, additional imaging may be warranted with CT and/or MRI. Electronically Signed   By: Aletta Edouard M.D.   On: 07/25/2017 17:48   Dg Chest Port 1 View  Result Date: 08/03/2017 CLINICAL DATA:  Pneumonia EXAM: PORTABLE CHEST 1 VIEW COMPARISON:  08/02/2017 FINDINGS: Tracheostomy in good position. Right arm PICC tip in the SVC. NG tube in the stomach. Bibasilar airspace disease unchanged. Small left effusion. Negative for edema. IMPRESSION: Bibasilar atelectasis/infiltrate unchanged. Support lines remain in good position. Electronically Signed   By: Franchot Gallo M.D.   On: 08/03/2017 07:14   Dg Chest Port 1 View  Result Date: 08/02/2017 CLINICAL DATA:  Shortness of Breath EXAM: PORTABLE CHEST 1 VIEW COMPARISON:  August 01, 2017 FINDINGS: Tracheostomy  catheter tip is 5.0 cm above the carina. Central catheter tip is in the superior vena cava. Nasogastric tube tip and side port are below the diaphragm. No pneumothorax. There is airspace consolidation left lower lobe with small left pleural effusion. There is mild right base atelectasis. There is cardiomegaly with pulmonary vascularity within normal limits. No adenopathy. No bone lesions. IMPRESSION: Tube and catheter positions  as described without evident pneumothorax. Airspace consolidation consistent with pneumonia left lower lobe with small left pleural effusion. Mild right base atelectasis. Stable cardiac prominence. Electronically Signed   By: Lowella Grip III M.D.   On: 08/02/2017 07:55   Dg Chest Port 1 View  Result Date: 08/01/2017 CLINICAL DATA:  Pneumonia EXAM: PORTABLE CHEST 1 VIEW COMPARISON:  07/30/2017, 07/29/2017, 07/27/2017, 07/23/2017 FINDINGS: Tracheostomy tube remains in place. Esophageal tube tip is below the diaphragm but non included. Interval opacification of the right diaphragm. No change in dense left lung base consolidation. Probable tiny pleural effusions. Enlarged cardiomediastinal silhouette is unchanged. Right upper extremity catheter tip overlies the SVC. IMPRESSION: 1. Increased opacity at the right base may reflect atelectasis 2. No significant change in probable small left effusion and dense left lung base consolidation, atelectasis versus pneumonia. 3. Stable cardiomegaly Electronically Signed   By: Donavan Foil M.D.   On: 08/01/2017 02:56   Dg Chest Port 1 View  Result Date: 07/30/2017 CLINICAL DATA:  Pneumonia EXAM: PORTABLE CHEST 1 VIEW COMPARISON:  07/29/2017 and prior radiographs FINDINGS: A tracheostomy tube, RIGHT PICC line with tip overlying the mid-LOWER SVC and endotracheal tube with tip overlying the distal esophagus again noted. Decreased LEFT LOWER lung consolidation/atelectasis noted. Mild RIGHT basilar atelectasis again noted. There is no evidence of  pneumothorax. No other interval changes identified. IMPRESSION: 1. Decreased LEFT LOWER lung consolidation/atelectasis. 2. NG tube with tip overlying the distal esophagus-recommend advancement. Electronically Signed   By: Margarette Canada M.D.   On: 07/30/2017 07:49   Dg Chest Port 1 View  Result Date: 07/29/2017 CLINICAL DATA:  Acute on chronic respiratory failure. EXAM: PORTABLE CHEST 1 VIEW COMPARISON:  Radiograph July 27, 2017. FINDINGS: Stable cardiomegaly. Tracheostomy is in grossly good position. Distal tip of nasogastric tube is seen in distal esophagus. No pneumothorax is noted. Right-sided PICC line is noted with distal tip in expected position of SVC. Mild bibasilar subsegmental atelectasis is noted. Bony thorax is unremarkable. IMPRESSION: Mild bibasilar subsegmental atelectasis. Endotracheal tube in grossly good position. Distal tip of nasogastric tube has withdrawn into distal esophagus. Electronically Signed   By: Marijo Conception, M.D.   On: 07/29/2017 22:01   Dg Chest Port 1 View  Result Date: 07/27/2017 CLINICAL DATA:  Excessive fluid EXAM: PORTABLE CHEST 1 VIEW COMPARISON:  Chest x-ray of July 23, 2017 FINDINGS: The lungs are well-expanded. There is a trace of pleural fluid on the right and slightly more on the left. The retrocardiac region remains dense. The cardiac silhouette is enlarged. The central pulmonary vascularity is prominent. The mediastinum is normal in width. The trachea is midline. The right-sided PICC line tip projects over the midportion of the SVC. The tracheostomy tube tip projects at the superior margin of the clavicular heads. The bony thorax exhibits no acute abnormality. IMPRESSION: Slight improved aeration of both lungs today. Small amounts of pleural fluid at both bases. Persistent atelectasis or pneumonia in the left lower lobe. Top-normal cardiac size with central pulmonary vascular congestion but no definite pulmonary edema. Electronically Signed   By: David  Martinique  M.D.   On: 07/27/2017 10:55   Dg Chest Port 1 View  Result Date: 07/23/2017 CLINICAL DATA:  Acute on chronic respiratory failure. Airway aspiration. EXAM: PORTABLE CHEST 1 VIEW COMPARISON:  07/22/2017. FINDINGS: Interval enlargement of the cardiac silhouette. Stable dense left lower lobe airspace opacity. Interval increased linear and patchy density at the right lung base. Stable small bilateral pleural effusions. Nasogastric tube extending into  the stomach. Thoracic spine degenerative changes. IMPRESSION: 1. No significant change in dense left lower lobe atelectasis or pneumonia and small left pleural effusion. 2. Increased right basilar atelectasis or pneumonia with a stable small right pleural effusion. Electronically Signed   By: Claudie Revering M.D.   On: 07/23/2017 16:09   Dg Chest Port 1 View  Result Date: 07/22/2017 CLINICAL DATA:  Respiratory failure.  COPD. EXAM: PORTABLE CHEST 1 VIEW COMPARISON:  07/19/2017. FINDINGS: The cardiac silhouette remains borderline enlarged. Stable left pleural effusion and left basilar airspace opacity. Small right pleural effusion, decreased, with decreased adjacent right basilar atelectasis. Thoracic spine degenerative changes. IMPRESSION: 1. Stable left pleural effusion and left lower lobe atelectasis or pneumonia. 2. Decreased right pleural fluid and right basilar atelectasis. Electronically Signed   By: Claudie Revering M.D.   On: 07/22/2017 09:14   Dg Chest Port 1 View  Result Date: 07/19/2017 CLINICAL DATA:  Respiratory failure EXAM: PORTABLE CHEST 1 VIEW COMPARISON:  July 15, 2017 FINDINGS: There are small pleural effusions bilaterally. There is consolidation in the left lower lobe. Lungs elsewhere are clear. Heart size is upper normal with pulmonary vascularity within normal limits. No adenopathy. There is degenerative change in the thoracic spine. IMPRESSION: Left lower lobe consolidation with small pleural effusions bilaterally. There is mild bibasilar  atelectasis. Stable cardiac silhouette. Electronically Signed   By: Lowella Grip III M.D.   On: 07/19/2017 07:57   Dg Chest Port 1 View  Result Date: 07/15/2017 CLINICAL DATA:  Acute respiratory failure. EXAM: PORTABLE CHEST 1 VIEW COMPARISON:  Chest x-ray from yesterday FINDINGS: The heart size and mediastinal contours are within normal limits. Normal pulmonary vascularity. Unchanged left greater than right bibasilar atelectasis and scarring at the costophrenic angles. No focal consolidation, pleural effusion, or pneumothorax. No acute osseous abnormality. IMPRESSION: Stable bibasilar atelectasis/scarring. Electronically Signed   By: Titus Dubin M.D.   On: 07/15/2017 10:41   Dg Chest Portable 1 View  Result Date: 07/14/2017 CLINICAL DATA:  Shortness of breath. EXAM: PORTABLE CHEST 1 VIEW COMPARISON:  06/05/2017. FINDINGS: Mediastinum and hilar structures normal. Stable mild cardiomegaly with normal pulmonary vascularity. Low lung volumes with mild basilar atelectasis. Stable bilateral pleural thickening consistent scarring. Degenerative changes thoracic spine. IMPRESSION: Low lung volumes with mild basilar atelectasis and/or pleuroparenchymal scarring again noted. Stable mild cardiomegaly. Chest is stable from prior exam. Electronically Signed   By: Marcello Moores  Register   On: 07/14/2017 17:13   [place] Ekg Site Rite  Result Date: 07/25/2017 If Site Rite image not attached, placement could not be confirmed due to current cardiac rhythm.    CBC Recent Labs  Lab 07/30/17 0459 08/01/17 0436 08/01/17 2342 08/02/17 0507 08/03/17 0539  WBC 12.8* 9.3  --  13.4* 10.0  HGB 7.5* 7.1* 9.5* 9.2* 9.0*  HCT 23.2* 22.7*  --  28.7* 27.9*  PLT 353 346  --  322 268  MCV 90.9 92.2  --  90.9 91.7  MCH 29.2 28.9  --  29.2 29.7  MCHC 32.1 31.3*  --  32.2 32.4  RDW 17.2* 16.8*  --  16.2* 15.7*  LYMPHSABS 0.7* 0.7*  --  0.7* 0.6*  MONOABS 0.7 0.5  --  0.7 0.6  EOSABS 0.2 0.2  --  0.2 0.1  BASOSABS 0.0 0.0   --  0.0 0.0    Chemistries  Recent Labs  Lab 07/28/17 0519 07/29/17 0808 07/30/17 0459 08/01/17 0436 08/02/17 0507 08/03/17 0539  NA 135 135  --  139 139  139  K 3.9 3.6 3.8 3.8 3.7 3.7  CL 92* 94*  --  100* 99* 99*  CO2 33* 32  --  34* 32 33*  GLUCOSE 138* 120*  --  120* 113* 118*  BUN 14 15  --  16 16 20   CREATININE <0.30* <0.30*  --  <0.30* <0.30* 0.47  CALCIUM 8.8* 8.4*  --  8.9 9.1 9.0  MG 1.6* 1.8 1.7 1.7 1.5* 1.9   ------------------------------------------------------------------------------------------------------------------ estimated creatinine clearance is 58.3 mL/min (by C-G formula based on SCr of 0.47 mg/dL). ------------------------------------------------------------------------------------------------------------------ No results for input(s): HGBA1C in the last 72 hours. ------------------------------------------------------------------------------------------------------------------ No results for input(s): CHOL, HDL, LDLCALC, TRIG, CHOLHDL, LDLDIRECT in the last 72 hours. ------------------------------------------------------------------------------------------------------------------ No results for input(s): TSH, T4TOTAL, T3FREE, THYROIDAB in the last 72 hours.  Invalid input(s): FREET3 ------------------------------------------------------------------------------------------------------------------ No results for input(s): VITAMINB12, FOLATE, FERRITIN, TIBC, IRON, RETICCTPCT in the last 72 hours.  Coagulation profile No results for input(s): INR, PROTIME in the last 168 hours.  No results for input(s): DDIMER in the last 72 hours.  Cardiac Enzymes No results for input(s): CKMB, TROPONINI, MYOGLOBIN in the last 168 hours.  Invalid input(s): CK ------------------------------------------------------------------------------------------------------------------ Invalid input(s): POCBNP    Assessment & Plan   62 year old female patient with history of  chronic respiratory failure, GI bleed currently in the hospital for acute on chronic hypercapnic respiratory failure and anemia secondary to slow GI bleed.  1.Acute on chronic hypercapnic respiratory failure S/p tracheostomy on ventilator support, patient did not tolerate weaning trials yesterday with increased work of breathing.  Patient is on full vent support. Impaired ventilatory drive and global muscle weakness Appreciate intensivist management.    2. Moderate malnutrition  Feeding supplements  3.  Moderate malnutrition   Continue NG tube feeding, appreciate GI following.,waiting for swallow eval 4. Electrolyte imbalance F/u magnesium and potassium  5.  Acute on chronic gastrointestinal bleeding s/p EGD 3/18 showing Normal duodenal bulb and second portion of the duodenum.Duodenal lipoma. Non-bleeding gastric ulcer with a clean ulcer base (Forrest Class III).Normal gastroesophageal junction and esophagus. - protonix 40 mg IV bid - 6. DVT prophylaxis with SCD  7. Hypomagnesemia : Replace magnesium  8.  Management follow-up for possible LTAC placement  9. euthyroid sick syndrome.        Code Status Orders  (From admission, onward)        Start     Ordered   07/14/17 2115  Full code  Continuous     07/14/17 2122    Code Status History    Date Active Date Inactive Code Status Order ID Comments User Context   06/05/2017 1654 06/08/2017 1820 Full Code [id number]  Bettey Costa, MD Inpatient   11/01/2016 0121 11/09/2016 2044 Full Code [id number]  Lance Coon, MD Inpatient       Time Spent in minutes   35 minutes  Greater than 50% of time spent in care coordination and counseling patient regarding the condition and plan of care.   Epifanio Lesches M.D on 08/03/2017 at 2:06 PM  Between 7am to 6pm - Pager - [phone]  After 6pm go to [email or web address] - Proofreader  [employer]   Office  [phone]

## 2017-08-03 NOTE — Progress Notes (Signed)
Nutrition Follow-up  DOCUMENTATION CODES:   Not applicable  INTERVENTION:  Continue Vital High Protein at 50 mL/hr (1200 mL goal daily volume) via NGT. Provides 1200 kcal, 105 grams of protein, 1008 mL H2O daily. With current rate of D5 provides 1302 kcal daily.  With free water flush of 200 mL Q6hrs patient is receiving a total of 1808 mL H2O daily including water in tube feeding.  Continue liquid MVI daily per tube and thiamine 100 mg daily per tube.  NUTRITION DIAGNOSIS:   Increased nutrient needs related to catabolic illness(acute exacerbation of COPD) as evidenced by estimated needs.  Ongoing.  GOAL:   Patient will meet greater than or equal to 90% of their needs  Met with TF regimen.  MONITOR:   PO intake, Labs, Weight trends, TF tolerance, I & O's  REASON FOR ASSESSMENT:   Malnutrition Screening Tool, Consult Enteral/tube feeding initiation and management  ASSESSMENT:   62 year old female with PMHx of HLD, GI bleed, emphysema of lung, COPD who was admitted 3/15 with acute blood loss anemia, acute encephalopathy, and acute on chronic hypercapnic respiratory failure secondary to acute exacerbation of COPD requiring BiPAP. On 3/17 she was able to transfer to [facility] unit. She underwent EGD on 3/18 and a rapid response was called due to respiratory failure likely due to sedating medications once again requiring BiPAP.   -Patient s/p tracheostomy tube placement on 3/28.  Patient intubated and sedated. Trials at weaning from vent have been unsuccessful so far. Patient sleeping at time of RD assessment this morning so did not wake her up. When awake she is able to communicate by writing.  Access: 14 Fr. NGT placed 3/23; terminates in distal stomach per abdominal x-ray 4/1; 58 cm at left nare  TF: pt tolerating VHP @ 50 mL/hr  Patient is currently intubated on ventilator support MV: 6.6 L/min Temp (24hrs), Avg:98.4 F (36.9 C), Min:97.7 F (36.5 C), Max:99.1 F (37.3  C)  Propofol: N/A  Medications reviewed and include: Senokot, Colace, FWF 200 mL Q6hrs, Novolog 0-9 units Q6hrs, Remeron, liquid MVI daily, Protonix, thiamine 100 mg daily, D5 @ 25 mL/hr (30 grams dextrose, 102 kcal daily), Zosyn.  Labs reviewed: CBG 88-113, Chloride 99, CO2 33.  I/O: 1060 mL UOP yesterday (0.8 mL/kg/hr)  Discussed with RN and on rounds. Plan is to consider PEG tube placement next week if she is still unable to wean off of the vent.  Diet Order:  Diet NPO time specified  EDUCATION NEEDS:   Not appropriate for education at this time  Skin:  Skin Assessment: Skin Integrity Issues:(scattered ecchymosis) Skin Integrity Issues:: Incisions Incisions: closed incision neck  Last BM:  07/25/2017 - large type 6  Height:   Ht Readings from Last 1 Encounters:  07/14/17 5' 2.5" (1.588 m)    Weight:   Wt Readings from Last 1 Encounters:  08/02/17 126 lb 8.7 oz (57.4 kg)    Ideal Body Weight:  51.1 kg  BMI:  Body mass index is 22.78 kg/m.  Estimated Nutritional Needs:   Kcal:  0272 (PSU 2003b w/ MSJ 1120, Ve 6.8, Tmax 37)  Protein:  90-105 grams (1.5-1.7 grams/kg)  Fluid:  1.5-1.8 L/day (25-30 mL/kg)  Willey Blade, MS, RD, LDN Office: [phone] Pager: [phone] After Hours/Weekend Pager: [phone]

## 2017-08-03 NOTE — Progress Notes (Signed)
Occupational Therapy Treatment Patient Details Name: Kayla Boyer MRN: [id number] DOB: 1955-10-14 Today's Date: 08/03/2017    History of present illness Kayla Boyer  is a 62 y.o. female with a known history of gastrointestinal bleeding, gastric ulcer, hyperlipidemia, emphysema of lung was referred by ER physician Dr.malinda for weakness and dark stools.  Patient presented to the emergency room with generalized weakness.  She has noticed a dark black stools for the last few days she was recently in our hospital last month and had an endoscopy which showed gastric ulcers she was discharged on oral proton pump inhibitors.  Patient is lethargic and weak.  Has nausea and vomitings.  No complaints of any chest pain, abdominal pain.  No fever and chills.Hemoglobin dropped from 11.7 to 9.7. Stool for occult blood is positive.  Diagnosis of GI bleed and then developed acute resp distress and placed on BIPAP and then required a ventilator. She is currently vent dependent and did not tolerate weaning over the weekend to a lower setting. NG tube in place and NPO.    OT comments  Pt. Education was provided about hand strengthening exercises using pink theraputty. Pt. Was provided with visual demonstration, and visual cues, and well as a visual handout. Pt. Was able to return demonstration. Pt. Continues to benefit from OT services for ADL training, A/E training, and pt. Education about home modification, DM, and review of theraputty ex.   Follow Up Recommendations  LTACH    Equipment Recommendations       Recommendations for Other Services      Precautions / Restrictions Precautions Precautions: Fall Restrictions Weight Bearing Restrictions: No RLE Weight Bearing: Non weight bearing LLE Weight Bearing: Non weight bearing       Mobility Bed Mobility                  Transfers  deferred. Pt. Seen at bed level.                    Balance                                            ADL either performed or assessed with clinical judgement   ADL Overall ADL's : Needs assistance/impaired Eating/Feeding: NPO   Grooming: Minimal assistance   Upper Body Bathing: Maximal assistance   Lower Body Bathing: Total assistance   Upper Body Dressing : Moderate assistance   Lower Body Dressing: Maximal assistance;+2 for physical assistance;Bed level   Toilet Transfer: Total assistance           Functional mobility during ADLs: Maximal assistance;+2 for physical assistance       Vision Patient Visual Report: No change from baseline     Perception     Praxis      Cognition                                                Exercises Other Exercises Other Exercises: Pt. education was provided about pink theraputty exercises following a HEP. Pt. was provided with a handout of the exercises.   Shoulder Instructions       General Comments      Pertinent Vitals/ Pain  Home Living                                          Prior Functioning/Environment              Frequency  Min 2X/week        Progress Toward Goals  OT Goals(current goals can now be found in the care plan section)     Acute Rehab OT Goals Patient Stated Goal: "to get stronger and take care of myself" OT Goal Formulation: With patient Potential to Achieve Goals: Fair  Plan      Co-evaluation                 AM-PAC PT "6 Clicks" Daily Activity     Outcome Measure   Help from another person eating meals?: Total Help from another person taking care of personal grooming?: A Little Help from another person toileting, which includes using toliet, bedpan, or urinal?: Total Help from another person bathing (including washing, rinsing, drying)?: Total Help from another person to put on and taking off regular upper body clothing?: A Lot Help from another person to put on and taking off regular lower body  clothing?: Total 6 Click Score: 9    End of Session    OT Visit Diagnosis: Muscle weakness (generalized) (M62.81);Feeding difficulties (R63.3);Other abnormalities of gait and mobility (R26.89)   Activity Tolerance Patient tolerated treatment well   Patient Left in bed;with call bell/phone within reach;with bed alarm set;with family/visitor present   Nurse Communication          Time: 1450-1510 OT Time Calculation (min): 20 min  Charges: OT Treatments $Self Care/Home Management : 8-22 mins  Harrel Carina, MS, OTR/L  Harrel Carina, MS, OTR/L 08/03/2017, 6:11 PM

## 2017-08-04 ENCOUNTER — Inpatient Hospital Stay: Payer: Medicaid Other

## 2017-08-04 LAB — BASIC METABOLIC PANEL
Anion gap: 7 (ref 5–15)
BUN: 17 mg/dL (ref 6–20)
CALCIUM: 9 mg/dL (ref 8.9–10.3)
CHLORIDE: 99 mmol/L — AB (ref 101–111)
CO2: 35 mmol/L — ABNORMAL HIGH (ref 22–32)
Creatinine, Ser: <0.3 mg/dL — ABNORMAL LOW (ref 0.44–1.00)
Glucose, Bld: 87 mg/dL (ref 65–99)
Potassium: 3.4 mmol/L — ABNORMAL LOW (ref 3.5–5.1)
SODIUM: 141 mmol/L (ref 135–145)

## 2017-08-04 LAB — CBC WITH DIFFERENTIAL/PLATELET
BASOS PCT: 0 %
Basophils Absolute: 0 10*3/uL (ref 0–0.1)
Eosinophils Absolute: 0.2 10*3/uL (ref 0–0.7)
Eosinophils Relative: 2 %
HEMATOCRIT: 26.1 % — AB (ref 35.0–47.0)
HEMOGLOBIN: 8.2 g/dL — AB (ref 12.0–16.0)
LYMPHS ABS: 0.9 10*3/uL — AB (ref 1.0–3.6)
Lymphocytes Relative: 11 %
MCH: 28.8 pg (ref 26.0–34.0)
MCHC: 31.5 g/dL — ABNORMAL LOW (ref 32.0–36.0)
MCV: 91.4 fL (ref 80.0–100.0)
Monocytes Absolute: 0.6 10*3/uL (ref 0.2–0.9)
Monocytes Relative: 8 %
NEUTROS ABS: 6.2 10*3/uL (ref 1.4–6.5)
NEUTROS PCT: 79 %
Platelets: 257 10*3/uL (ref 150–440)
RBC: 2.86 MIL/uL — AB (ref 3.80–5.20)
RDW: 16 % — ABNORMAL HIGH (ref 11.5–14.5)
WBC: 7.9 10*3/uL (ref 3.6–11.0)

## 2017-08-04 LAB — GLUCOSE, CAPILLARY
GLUCOSE-CAPILLARY: 84 mg/dL (ref 65–99)
Glucose-Capillary: 86 mg/dL (ref 65–99)
Glucose-Capillary: 79 mg/dL (ref 65–99)

## 2017-08-04 LAB — MAGNESIUM: MAGNESIUM: 1.7 mg/dL (ref 1.7–2.4)

## 2017-08-04 LAB — CALCIUM, IONIZED: Calcium, Ionized, Serum: 5.2 mg/dL (ref 4.5–5.6)

## 2017-08-04 LAB — PROCALCITONIN: PROCALCITONIN: 0.17 ng/mL

## 2017-08-04 LAB — PHOSPHORUS: Phosphorus: 3.8 mg/dL (ref 2.5–4.6)

## 2017-08-04 MED ORDER — SODIUM CHLORIDE 0.9 % IV SOLN
1.0000 g | Freq: Three times a day (TID) | INTRAVENOUS | Status: DC
  Administered 2017-08-04 – 2017-08-07 (×8): 1 g via INTRAVENOUS
  Filled 2017-08-04 (×10): qty 1

## 2017-08-04 MED ORDER — POTASSIUM CHLORIDE 10 MEQ/100ML IV SOLN
10.0000 meq | INTRAVENOUS | Status: AC
  Administered 2017-08-04 (×2): 10 meq via INTRAVENOUS
  Filled 2017-08-04 (×2): qty 100

## 2017-08-04 MED ORDER — MAGNESIUM OXIDE 400 (241.3 MG) MG PO TABS
400.0000 mg | ORAL_TABLET | Freq: Two times a day (BID) | ORAL | Status: AC
  Administered 2017-08-04 – 2017-08-06 (×4): 400 mg
  Filled 2017-08-04 (×4): qty 1

## 2017-08-04 MED ORDER — POTASSIUM CHLORIDE 20 MEQ PO PACK
40.0000 meq | PACK | Freq: Once | ORAL | Status: AC
  Administered 2017-08-04: 40 meq
  Filled 2017-08-04: qty 2

## 2017-08-04 MED ORDER — MAGNESIUM OXIDE 400 (241.3 MG) MG PO TABS: 400.0000 mg | ORAL_TABLET | Freq: Two times a day (BID) | ORAL | Status: DC

## 2017-08-04 MED ORDER — SODIUM CHLORIDE 0.9 % IV SOLN
1.0000 g | Freq: Three times a day (TID) | INTRAVENOUS | Status: DC
  Filled 2017-08-04: qty 1

## 2017-08-04 NOTE — Progress Notes (Signed)
Physical Therapy Treatment Patient Details Name: Kayla Boyer MRN: [id number] DOB: 02-Dec-1955 Today's Date: 08/04/2017    History of Present Illness Kayla Boyer  is a 62 y.o. female with a known history of gastrointestinal bleeding, gastric ulcer, hyperlipidemia, emphysema of lung was referred by ER physician Dr.malinda for weakness and dark stools.  Patient presented to the emergency room with generalized weakness.  She has noticed a dark black stools for the last few days she was recently in our hospital last month and had an endoscopy which showed gastric ulcers she was discharged on oral proton pump inhibitors.  Patient is lethargic and weak.  Has nausea and vomitings.  No complaints of any chest pain, abdominal pain.  No fever and chills.Hemoglobin dropped from 11.7 to 9.7. Stool for occult blood is positive.  Diagnosis of GI bleed and then developed acute resp distress and placed on BIPAP and then required a ventilator. She is currently vent dependent and did not tolerate weaning over the weekend to a lower setting. NG tube in place and NPO.     PT Comments    Pt is making gradual progress towards goals with pt more motivated and willing to work with therapy this date. Pt still on vent, however on pressure and is more capable to breathe on own. Sats maintained throughout therapy session. Good progress with there-ex and pt updated on HEP. Able to tolerate sitting at EOB this session with +2 assist however fatigues quickly. Not able to progress to OOB mobility at this time. Will continue to progress. Pt remains in 40 degrees/ 55 degrees PF on R/L foot. Will continue to progress.  Follow Up Recommendations  LTACH     Equipment Recommendations  (possible hoyer)    Recommendations for Other Services       Precautions / Restrictions Precautions Precautions: Fall Restrictions Weight Bearing Restrictions: No    Mobility  Bed Mobility Overal bed mobility: Needs Assistance Bed Mobility:  Supine to Sit     Supine to sit: Max assist;+2 for physical assistance     General bed mobility comments: able to reach with R arm for railing, however still requires +2 for trunk control and B LEs. Once seated at EOB, 1 LOB noted with pt losing balance in post direction. Worked on weight shifts and pt able to progress to sitting with min assist and occasional cga. Able to tolerate sitting for 2-3 mintues. Returned back supine and adjusted with positioning.  Transfers                 General transfer comment: unable to perform  Ambulation/Gait                 Stairs            Wheelchair Mobility    Modified Rankin (Stroke Patients Only)       Balance                                            Cognition Arousal/Alertness: Awake/alert Behavior During Therapy: WFL for tasks assessed/performed Overall Cognitive Status: Within Functional Limits for tasks assessed                                 General Comments: Of note, pt writes on clipboard, received larger pen  Exercises Other Exercises Other Exercises: Supine ther-ex performed on B LE including ankle pumps (max assist on L and min assist on R), quad sets, heel slides, and hip abd/add. All ther-ex performed x 10 reps. Also performed 3 reps of dorsiflexion stretch x 30 second each as pt lacking neutral position. Discussed with RN about positioning. Placed in boots in netural position as well as positioned B arms. Wrote ther-ex on white board and discussed with pt and RN on frequency and duration. O2 sats maintained during entire session.    General Comments        Pertinent Vitals/Pain Pain Assessment: No/denies pain    Home Living                      Prior Function            PT Goals (current goals can now be found in the care plan section) Acute Rehab PT Goals Patient Stated Goal: "to get stronger and take care of myself" PT Goal Formulation:  With patient Time For Goal Achievement: 08/14/17 Potential to Achieve Goals: Good Progress towards PT goals: Progressing toward goals    Frequency    Min 2X/week      PT Plan Current plan remains appropriate    Co-evaluation              AM-PAC PT "6 Clicks" Daily Activity  Outcome Measure  Difficulty turning over in bed (including adjusting bedclothes, sheets and blankets)?: Unable Difficulty moving from lying on back to sitting on the side of the bed? : Unable Difficulty sitting down on and standing up from a chair with arms (e.g., wheelchair, bedside commode, etc,.)?: Unable Help needed moving to and from a bed to chair (including a wheelchair)?: Total Help needed walking in hospital room?: Total Help needed climbing 3-5 steps with a railing? : Total 6 Click Score: 6    End of Session Equipment Utilized During Treatment: (on vent, on pressure support) Activity Tolerance: Patient tolerated treatment well Patient left: in bed;with bed alarm set Nurse Communication: Mobility status PT Visit Diagnosis: Muscle weakness (generalized) (M62.81);Difficulty in walking, not elsewhere classified (R26.2)     Time: 3845-3646 PT Time Calculation (min) (ACUTE ONLY): 33 min  Charges:  $Therapeutic Exercise: 23-37 mins                    G Codes:       Greggory Stallion, PT, DPT [phone]    Dera Vanaken 08/04/2017, 11:47 AM

## 2017-08-04 NOTE — Progress Notes (Addendum)
Pharmacy Electrolyte Monitoring Consult:  Pharmacy consulted to assist in monitoring and replacing electrolytes in this 62 y.o. female admitted on 07/14/2017 with Weakness  Patient received potassium 71mEq IV Q1hr x 2 doses this am.   Labs:  Sodium (mmol/L)  Date Value  08/04/2017 141   Potassium (mmol/L)  Date Value  08/04/2017 3.4 (L)   Magnesium (mg/dL)  Date Value  08/04/2017 1.7   Phosphorus (mg/dL)  Date Value  08/04/2017 3.8   Calcium (mg/dL)  Date Value  08/04/2017 9.0   Albumin (g/dL)  Date Value  07/26/2017 2.5 (L)    Plan: Magnesium is trending down, will order magnesium 400mg  VT Q12hr x 4 doses.   Will order additional potassium 4mEq VT x 1.   Will recheck electrolytes with am labs.   Pharmacy will continue to monitor and adjust per consult.   Simpson,Michael L 08/04/2017 3:00 PM

## 2017-08-04 NOTE — Progress Notes (Signed)
[facility] Progess Note    SYNOPSIS   Patient with underlying history of severe emphysema, hypertension, GI bleed, status post endoscopy, respiratory failure, pneumonia, tracheostomy on mechanical ventilation.  ASSESSMENT/PLAN   Respiratory failure. Patient is presently on mechanical ventilation, albuterol, Atrovent, Zosyn empirically  GI bleed. Mild decrease in hemoglobin, no clear evidence of active bleeding at this time. Is on a PPI and has been seenogy.  Hypokalemia. We'll replace  Critical care time 35 minutes  VENTILATOR SETTINGS: Vent Mode: PRVC FiO2 (%):  [21 %-50 %] 50 % Set Rate:  [15 bmp-18 bmp] 15 bmp Vt Set:  [450 mL] 450 mL PEEP:  [5 cmH20] 5 cmH20 Plateau Pressure:  [16 cmH20-18 cmH20] 17 cmH20  INTAKE / OUTPUT:  Intake/Output Summary (Last 24 hours) at 08/04/2017 0908 Last data filed at 08/04/2017 0800 Gross per 24 hour  Intake 2135 ml  Output 1150 ml  Net 985 ml    Name: Kayla Boyer MRN: [id number] DOB: 12/24/1955    ADMISSION DATE:  07/14/2017  SUBJECTIVE:   Pt currently on the ventilator. Awake, alert, communicating voicing no complaints at this time  VITAL SIGNS: Temp:  [97.7 F (36.5 C)-98 F (36.7 C)] 98 F (36.7 C) (04/04 1600) Pulse Rate:  [80-100] 91 (04/05 0800) Resp:  [14-26] 16 (04/05 0800) BP: (93-145)/(64-113) 133/96 (04/05 0800) SpO2:  [82 %-100 %] 99 % (04/05 0800) FiO2 (%):  [21 %-50 %] 50 % (04/05 0800)  PHYSICAL EXAMINATION: Physical Examination:   VS: BP (!) 133/96 (BP Location: Left Arm)   Pulse 91   Temp 98 F (36.7 C) (Oral)   Resp 16   Ht 5' 2.5" (1.588 m)   Wt 57.4 kg (126 lb 8.7 oz)   SpO2 99%   BMI 22.78 kg/m   General Appearance: No distress  Neuro:without focal findings, mental status normal. HEENT: tracheostomy in place on mechanical ventilation Pulmonary: relatively clear, right lower lobe posterior inspiratory crackles appreciated CardiovascularNormal S1,S2.  No m/r/g.     Abdomen: Soft positive bowel sounds. Skin:   warm, no rashes, no ecchymosis  Extremities: normal, no cyanosis, clubbing.    LABORATORY PANEL:   CBC Recent Labs  Lab 08/04/17 0559  WBC 7.9  HGB 8.2*  HCT 26.1*  PLT 257    Chemistries  Recent Labs  Lab 08/04/17 0559  NA 141  K 3.4*  CL 99*  CO2 35*  GLUCOSE 87  BUN 17  CREATININE <0.30*  CALCIUM 9.0  MG 1.7  PHOS 3.8    Recent Labs  Lab 08/03/17 0030 08/03/17 0543 08/03/17 1154 08/03/17 1754 08/03/17 2350 08/04/17 0555  GLUCAP 108* 113* 88 77 108* 86   Recent Labs  Lab 08/01/17 0515 08/03/17 0525  PHART 7.50* 7.53*  PCO2ART 48 41  PO2ART 105 154*   No results for input(s): AST, ALT, ALKPHOS, BILITOT, ALBUMIN in the last 168 hours.  Cardiac Enzymes No results for input(s): TROPONINI in the last 168 hours.  RADIOLOGY:  Dg Chest Port 1 View  Result Date: 08/04/2017 CLINICAL DATA:  Pneumonia EXAM: PORTABLE CHEST 1 VIEW COMPARISON:  08/03/2017, 08/02/2017, 07/30/2017 FINDINGS: Tracheostomy tube is in place. Worsening airspace disease at the right lung base. No change in dense left lung base consolidation. Probable small effusions. Stable enlarged cardiomediastinal silhouette. IMPRESSION: 1. Worsening airspace disease at the right lung base may reflect worsening atelectasis or pneumonia 2. No change in dense left lower lobe atelectasis or pneumonia. Probable small pleural effusions. Electronically Signed  By: Donavan Foil M.D.   On: 08/04/2017 03:58   Dg Chest Port 1 View  Result Date: 08/03/2017 CLINICAL DATA:  Pneumonia EXAM: PORTABLE CHEST 1 VIEW COMPARISON:  08/02/2017 FINDINGS: Tracheostomy in good position. Right arm PICC tip in the SVC. NG tube in the stomach. Bibasilar airspace disease unchanged. Small left effusion. Negative for edema. IMPRESSION: Bibasilar atelectasis/infiltrate unchanged. Support lines remain in good position. Electronically Signed   By: Franchot Gallo M.D.   On: 08/03/2017 07:14     Hermelinda Dellen, DO  08/04/2017

## 2017-08-04 NOTE — Progress Notes (Signed)
Pt's NG tube has clotted off, unable to unclog.  Removed NG from left nare. New NG tube placed to right nare at 65 cm.  Abdominal xray ordered to ensure proper placement, awaiting xray.

## 2017-08-04 NOTE — Progress Notes (Signed)
[place] at [facility]                                                                                                                                                                                  Patient Demographics   Kayla Boyer, is a 62 y.o. female, DOB - [birth year], NID:[id number]  Admit date - 07/14/2017   Admitting Physician Saundra Shelling, MD  Outpatient Primary MD for the patient is Patient, [name]   LOS - 21   on pressure support with weaning trials.  She says she is feeling okay today.  Review of Systems:  On ventilator support via tracheostomy Opens eyes and communicate some verbal commands 10 point review of systems negative except documented in HPI.  Vitals:   Vitals:   08/04/17 1300 08/04/17 1400 08/04/17 1500 08/04/17 1600  BP: 91/64 109/81 104/71 98/72  Pulse: 86 84 91   Resp: 13 15 (!) 22 15  Temp:    97.9 F (36.6 C)  TempSrc:    Oral  SpO2: 100% 100% 100% 100%  Weight:      Height:        Wt Readings from Last 3 Encounters:  08/02/17 57.4 kg (126 lb 8.7 oz)  07/05/17 60.8 kg (134 lb)  07/04/17 60.8 kg (134 lb)     Intake/Output Summary (Last 24 hours) at 08/04/2017 1626 Last data filed at 08/04/2017 1613 Gross per 24 hour  Intake 2615 ml  Output 1500 ml  Net 1115 ml    Physical Exam:   GENERAL: 62 year old female patient status post tracheostomy and patient is connected to vent. HEAD, EYES, EARS, NOSE AND THROAT: Atraumatic, normocephalic. Extraocular muscles are intact. Pupils equal and reactive to light. Sclerae anicteric. No conjunctival injection. No oro-pharyngeal erythema.  NECK: Supple. There is no jugular venous distention. No bruits, no lymphadenopathy, no thyromegaly.  Patient has trach in the neck.   HEART: Regular rate and rhythm,. No murmurs, no rubs, no clicks.  LUNGS: Clear to auscultation bilaterally. No rales or rhonchi. No wheezes.  On ventilator support via tracheostomy ABDOMEN: Soft, flat,  nontender, nondistended. Has good bowel sounds. No hepatosplenomegaly appreciated.  EXTREMITIES: No evidence of any cyanosis, clubbing.  +2 pedal and radial pulses bilaterally.  1+ pedal edema NEUROLOGIC: The patient is alert, arousable, and oriented x2 with   Decreased hearing, left arm weakness improved Right pupil greater than left pupil SKIN: Moist and warm with no rashes appreciated.  Psych: Not be assessed LN: No inguinal LN enlargement    Antibiotics   Anti-infectives (From admission, onward)   Start     Dose/Rate Route Frequency Ordered Stop   08/04/17  2200  ceFEPIme (MAXIPIME) 1 g in sodium chloride 0.9 % 100 mL IVPB  Status:  Discontinued     1 g 200 mL/hr over 30 Minutes Intravenous Every 8 hours 08/04/17 1218 08/04/17 1448   08/04/17 2200  ceFEPIme (MAXIPIME) 1 g in sodium chloride 0.9 % 100 mL IVPB     1 g 200 mL/hr over 30 Minutes Intravenous Every 8 hours 08/04/17 1448 08/09/17 2159   08/03/17 0100  piperacillin-tazobactam (ZOSYN) IVPB 3.375 g  Status:  Discontinued     3.375 g 12.5 mL/hr over 240 Minutes Intravenous Every 8 hours 08/02/17 1701 08/02/17 1704   08/02/17 1730  piperacillin-tazobactam (ZOSYN) IVPB 3.375 g  Status:  Discontinued     3.375 g 12.5 mL/hr over 240 Minutes Intravenous Every 8 hours 08/02/17 1704 08/04/17 1217   08/02/17 1645  piperacillin-tazobactam (ZOSYN) IVPB 3.375 g  Status:  Discontinued     3.375 g 12.5 mL/hr over 240 Minutes Intravenous Every 8 hours 08/02/17 1639 08/02/17 1701   07/14/17 2200  azithromycin (ZITHROMAX) 500 mg in sodium chloride 0.9 % 250 mL IVPB  Status:  Discontinued     500 mg 250 mL/hr over 60 Minutes Intravenous Every 24 hours 07/14/17 2122 07/18/17 0915      Medications   Scheduled Meds: . acetylcysteine  3 mL Nebulization BID  . chlorhexidine  15 mL Mouth Rinse BID  . sennosides  5 mL Per Tube BID   And  . docusate  50 mg Per Tube BID  . free water  200 mL Per Tube Q6H  . insulin aspart  0-9 Units  Subcutaneous Q6H  . ipratropium-albuterol  3 mL Nebulization TID  . liver oil-zinc oxide   Topical Daily  . magnesium oxide  400 mg Per Tube BID  . mouth rinse  15 mL Mouth Rinse QID  . mirtazapine  15 mg Oral QHS  . multivitamin  15 mL Per Tube Daily  . pantoprazole sodium  40 mg Per Tube BID  . potassium chloride  40 mEq Per Tube Once  . sodium chloride flush  10-40 mL Intracatheter Q12H  . thiamine  100 mg Per Tube Daily  . thyroid  30 mg Per Tube QAC breakfast   Continuous Infusions: . ceFEPime (MAXIPIME) IV    . dextrose 25 mL/hr at 08/04/17 1600  . feeding supplement (VITAL HIGH PROTEIN) 50 mL/hr at 08/04/17 1600   PRN Meds:.acetaminophen (TYLENOL) oral liquid 160 mg/5 mL, acetaminophen **OR** acetaminophen, ALPRAZolam, bisacodyl, metoprolol tartrate, morphine injection, [DISCONTINUED] ondansetron **OR** ondansetron (ZOFRAN) IV, simethicone, sodium chloride flush   Data Review:   Micro Results Recent Results (from the past 240 hour(s))  Culture, respiratory (NON-Expectorated)     Status: None (Preliminary result)   Collection Time: 08/02/17  6:23 PM  Result Value Ref Range Status   Specimen Description   Final    TRACHEAL ASPIRATE Performed at [facility], [address] [place], [place] [address]    Special Requests   Final    NONE Performed at [facility], [address], [place], [place] [address]    Gram Stain   Final    ABUNDANT WBC PRESENT, PREDOMINANTLY PMN FEW GRAM POSITIVE RODS    Culture   Final    CULTURE REINCUBATED FOR BETTER GROWTH Performed at [facility], [place] [address], [place], [place] [address]    Report Status PENDING  Incomplete    Radiology Reports Dg Abd 1 View  Result Date: 07/31/2017 CLINICAL  DATA:  Nasogastric tube placement. EXAM: ABDOMEN - 1 VIEW COMPARISON:  Radiograph July 22, 2017. FINDINGS: The bowel gas pattern is normal. Distal tip of feeding tube is seen in distal stomach. No radio-opaque calculi  or other significant radiographic abnormality are seen. IMPRESSION: Distal tip of nasogastric tube seen in distal stomach. No evidence of bowel obstruction or ileus. Electronically Signed   By: Marijo Conception, M.D.   On: 07/31/2017 11:47   Dg Abd 1 View  Result Date: 07/22/2017 CLINICAL DATA:  Encounter for feeding tube placement. EXAM: ABDOMEN - 1 VIEW COMPARISON:  Chest radiograph 07/22/2017 FINDINGS: A nasogastric tube has been advanced into the abdomen. Catheter tip is in the distal stomach region. Bowel gas throughout the abdomen. Persistent densities at the left lung base and the left hemidiaphragm remains obscured. IMPRESSION: Feeding tube tip in the distal stomach region. Electronically Signed   By: Markus Daft M.D.   On: 07/22/2017 16:30   Ct Head Wo Contrast  Result Date: 07/23/2017 CLINICAL DATA:  62 y/o F; changes to left pupil in strength of left arm. EXAM: CT HEAD WITHOUT CONTRAST TECHNIQUE: Contiguous axial images were obtained from the base of the skull through the vertex without intravenous contrast. COMPARISON:  None. FINDINGS: Brain: No evidence of acute infarction, hemorrhage, hydrocephalus, extra-axial collection or mass lesion/mass effect. Vascular: Calcific atherosclerosis of carotid siphons. No hyperdense vessel. Skull: Normal. Negative for fracture or focal lesion. Sinuses/Orbits: No acute finding. Other: None. IMPRESSION: Negative CT of the head. Electronically Signed   By: Kristine Garbe M.D.   On: 07/23/2017 14:33   Nm Gi Blood Loss  Result Date: 07/20/2017 CLINICAL DATA:  GI bleeding EXAM: NUCLEAR MEDICINE GASTROINTESTINAL BLEEDING SCAN TECHNIQUE: Sequential abdominal images were obtained following intravenous administration of Tc-65m labeled red blood cells. RADIOPHARMACEUTICALS:  Twenty mCi Tc-58m pertechnetate in-vitro labeled red cells. COMPARISON:  None. FINDINGS: Normal distribution of radiotracer is noted. No focal area of tracer accumulation is identified to  suggest active GI bleeding is noted. IMPRESSION: No findings to suggest active GI hemorrhage. Electronically Signed   By: Inez Catalina M.D.   On: 07/20/2017 15:16   US Venous Img Upper Bilat  Result Date: 07/16/2017 CLINICAL DATA:  Bilateral upper extremity edema acutely with pain and discomfort for 1 day EXAM: BILATERAL UPPER EXTREMITY VENOUS DOPPLER ULTRASOUND TECHNIQUE: Gray-scale sonography with graded compression, as well as color Doppler and duplex ultrasound were performed to evaluate the bilateral upper extremity deep venous systems from the level of the subclavian vein and including the jugular, axillary, basilic, radial, ulnar and upper cephalic vein. Spectral Doppler was utilized to evaluate flow at rest and with distal augmentation maneuvers. COMPARISON:  None. FINDINGS: Exam is limited because of upper extremity subcutaneous edema. RIGHT UPPER EXTREMITY Internal Jugular Vein: No evidence of thrombus. Normal compressibility, respiratory phasicity and response to augmentation. Subclavian Vein: No evidence of thrombus. Normal compressibility, respiratory phasicity and response to augmentation. Axillary Vein: No evidence of thrombus. Normal compressibility, respiratory phasicity and response to augmentation. Cephalic Vein: No evidence of thrombus. Normal compressibility, respiratory phasicity and response to augmentation. Basilic Vein: Not well visualized to accurately assess. Brachial Veins: No evidence of thrombus. Normal compressibility, respiratory phasicity and response to augmentation. Radial Veins: No evidence of thrombus. Normal compressibility, respiratory phasicity and response to augmentation. Ulnar Veins: No evidence of thrombus. Normal compressibility, respiratory phasicity and response to augmentation. Venous Reflux:  None. Other Findings:  Peripheral edema noted LEFT UPPER EXTREMITY Internal Jugular Vein: No evidence of thrombus.  Normal compressibility, respiratory phasicity and response  to augmentation. Subclavian Vein: No evidence of thrombus. Normal compressibility, respiratory phasicity and response to augmentation. Axillary Vein: No evidence of thrombus. Normal compressibility, respiratory phasicity and response to augmentation. Cephalic Vein: No evidence of thrombus. Normal compressibility, respiratory phasicity and response to augmentation. Basilic Vein: No evidence of thrombus. Normal compressibility, respiratory phasicity and response to augmentation. Brachial Veins: No evidence of thrombus. Normal compressibility, respiratory phasicity and response to augmentation. Radial Veins: No evidence of thrombus. Normal compressibility, respiratory phasicity and response to augmentation. Ulnar Veins: No evidence of thrombus. Normal compressibility, respiratory phasicity and response to augmentation. Venous Reflux:  None. Other Findings:  But peripheral subcutaneous edema noted. IMPRESSION: Limited exam but no gross occlusive upper extremity DVT in either arm. Electronically Signed   By: Jerilynn Mages.  Shick M.D.   On: 07/16/2017 12:34   US Venous Img Upper Uni Left  Result Date: 07/25/2017 CLINICAL DATA:  Left upper extremity edema. EXAM: LEFT UPPER EXTREMITY VENOUS DOPPLER ULTRASOUND TECHNIQUE: Gray-scale sonography with graded compression, as well as color Doppler and duplex ultrasound were performed to evaluate the upper extremity deep venous system from the level of the subclavian vein and including the jugular, axillary, basilic, radial, ulnar and upper cephalic vein. Spectral Doppler was utilized to evaluate flow at rest and with distal augmentation maneuvers. COMPARISON:  None. FINDINGS: Contralateral Subclavian Vein: Respiratory phasicity is normal and symmetric with the symptomatic side. No evidence of thrombus. Normal compressibility. Internal Jugular Vein: No evidence of thrombus. Normal compressibility, respiratory phasicity and response to augmentation. Subclavian Vein: No evidence of  thrombus. Normal compressibility, respiratory phasicity and response to augmentation. Axillary Vein: No evidence of thrombus. Normal compressibility, respiratory phasicity and response to augmentation. Cephalic Vein: No evidence of thrombus. Normal compressibility, respiratory phasicity and response to augmentation. Basilic Vein: No evidence of thrombus. Normal compressibility, respiratory phasicity and response to augmentation. Brachial Veins: No evidence of thrombus. Normal compressibility, respiratory phasicity and response to augmentation. Radial Veins: No evidence of thrombus. Normal compressibility, respiratory phasicity and response to augmentation. Ulnar Veins: No evidence of thrombus. Normal compressibility, respiratory phasicity and response to augmentation. Venous Reflux:  None visualized. Other Findings: There is significant edema noted in the left upper arm including some more locally marginated fluid in the subcutaneous tissues of the left medial upper arm. This fluid appears to extend over a long distance and likely represents subcutaneous fluid which is more prominent in 1 area of the upper arm. If there is any concern for soft tissue infection, additional imaging may be warranted. IMPRESSION: 1. No evidence of left upper extremity deep venous thrombosis. 2. Significant subcutaneous fluid in the left upper arm with an area of more marginated subcutaneous fluid also identified in the medial left upper arm. Although this does not have the appearance of a focal abscess by ultrasound, if there is concern for upper extremity infection, additional imaging may be warranted with CT and/or MRI. Electronically Signed   By: Aletta Edouard M.D.   On: 07/25/2017 17:48   Dg Chest Port 1 View  Result Date: 08/04/2017 CLINICAL DATA:  Pneumonia EXAM: PORTABLE CHEST 1 VIEW COMPARISON:  08/03/2017, 08/02/2017, 07/30/2017 FINDINGS: Tracheostomy tube is in place. Worsening airspace disease at the right lung base. No  change in dense left lung base consolidation. Probable small effusions. Stable enlarged cardiomediastinal silhouette. IMPRESSION: 1. Worsening airspace disease at the right lung base may reflect worsening atelectasis or pneumonia 2. No change in dense left lower lobe atelectasis or pneumonia.  Probable small pleural effusions. Electronically Signed   By: Donavan Foil M.D.   On: 08/04/2017 03:58   Dg Chest Port 1 View  Result Date: 08/03/2017 CLINICAL DATA:  Pneumonia EXAM: PORTABLE CHEST 1 VIEW COMPARISON:  08/02/2017 FINDINGS: Tracheostomy in good position. Right arm PICC tip in the SVC. NG tube in the stomach. Bibasilar airspace disease unchanged. Small left effusion. Negative for edema. IMPRESSION: Bibasilar atelectasis/infiltrate unchanged. Support lines remain in good position. Electronically Signed   By: Franchot Gallo M.D.   On: 08/03/2017 07:14   Dg Chest Port 1 View  Result Date: 08/02/2017 CLINICAL DATA:  Shortness of Breath EXAM: PORTABLE CHEST 1 VIEW COMPARISON:  August 01, 2017 FINDINGS: Tracheostomy catheter tip is 5.0 cm above the carina. Central catheter tip is in the superior vena cava. Nasogastric tube tip and side port are below the diaphragm. No pneumothorax. There is airspace consolidation left lower lobe with small left pleural effusion. There is mild right base atelectasis. There is cardiomegaly with pulmonary vascularity within normal limits. No adenopathy. No bone lesions. IMPRESSION: Tube and catheter positions as described without evident pneumothorax. Airspace consolidation consistent with pneumonia left lower lobe with small left pleural effusion. Mild right base atelectasis. Stable cardiac prominence. Electronically Signed   By: Lowella Grip III M.D.   On: 08/02/2017 07:55   Dg Chest Port 1 View  Result Date: 08/01/2017 CLINICAL DATA:  Pneumonia EXAM: PORTABLE CHEST 1 VIEW COMPARISON:  07/30/2017, 07/29/2017, 07/27/2017, 07/23/2017 FINDINGS: Tracheostomy tube remains in  place. Esophageal tube tip is below the diaphragm but non included. Interval opacification of the right diaphragm. No change in dense left lung base consolidation. Probable tiny pleural effusions. Enlarged cardiomediastinal silhouette is unchanged. Right upper extremity catheter tip overlies the SVC. IMPRESSION: 1. Increased opacity at the right base may reflect atelectasis 2. No significant change in probable small left effusion and dense left lung base consolidation, atelectasis versus pneumonia. 3. Stable cardiomegaly Electronically Signed   By: Donavan Foil M.D.   On: 08/01/2017 02:56   Dg Chest Port 1 View  Result Date: 07/30/2017 CLINICAL DATA:  Pneumonia EXAM: PORTABLE CHEST 1 VIEW COMPARISON:  07/29/2017 and prior radiographs FINDINGS: A tracheostomy tube, RIGHT PICC line with tip overlying the mid-LOWER SVC and endotracheal tube with tip overlying the distal esophagus again noted. Decreased LEFT LOWER lung consolidation/atelectasis noted. Mild RIGHT basilar atelectasis again noted. There is no evidence of pneumothorax. No other interval changes identified. IMPRESSION: 1. Decreased LEFT LOWER lung consolidation/atelectasis. 2. NG tube with tip overlying the distal esophagus-recommend advancement. Electronically Signed   By: Margarette Canada M.D.   On: 07/30/2017 07:49   Dg Chest Port 1 View  Result Date: 07/29/2017 CLINICAL DATA:  Acute on chronic respiratory failure. EXAM: PORTABLE CHEST 1 VIEW COMPARISON:  Radiograph July 27, 2017. FINDINGS: Stable cardiomegaly. Tracheostomy is in grossly good position. Distal tip of nasogastric tube is seen in distal esophagus. No pneumothorax is noted. Right-sided PICC line is noted with distal tip in expected position of SVC. Mild bibasilar subsegmental atelectasis is noted. Bony thorax is unremarkable. IMPRESSION: Mild bibasilar subsegmental atelectasis. Endotracheal tube in grossly good position. Distal tip of nasogastric tube has withdrawn into distal esophagus.  Electronically Signed   By: Marijo Conception, M.D.   On: 07/29/2017 22:01   Dg Chest Port 1 View  Result Date: 07/27/2017 CLINICAL DATA:  Excessive fluid EXAM: PORTABLE CHEST 1 VIEW COMPARISON:  Chest x-ray of July 23, 2017 FINDINGS: The lungs are well-expanded. There is  a trace of pleural fluid on the right and slightly more on the left. The retrocardiac region remains dense. The cardiac silhouette is enlarged. The central pulmonary vascularity is prominent. The mediastinum is normal in width. The trachea is midline. The right-sided PICC line tip projects over the midportion of the SVC. The tracheostomy tube tip projects at the superior margin of the clavicular heads. The bony thorax exhibits no acute abnormality. IMPRESSION: Slight improved aeration of both lungs today. Small amounts of pleural fluid at both bases. Persistent atelectasis or pneumonia in the left lower lobe. Top-normal cardiac size with central pulmonary vascular congestion but no definite pulmonary edema. Electronically Signed   By: David  Martinique M.D.   On: 07/27/2017 10:55   Dg Chest Port 1 View  Result Date: 07/23/2017 CLINICAL DATA:  Acute on chronic respiratory failure. Airway aspiration. EXAM: PORTABLE CHEST 1 VIEW COMPARISON:  07/22/2017. FINDINGS: Interval enlargement of the cardiac silhouette. Stable dense left lower lobe airspace opacity. Interval increased linear and patchy density at the right lung base. Stable small bilateral pleural effusions. Nasogastric tube extending into the stomach. Thoracic spine degenerative changes. IMPRESSION: 1. No significant change in dense left lower lobe atelectasis or pneumonia and small left pleural effusion. 2. Increased right basilar atelectasis or pneumonia with a stable small right pleural effusion. Electronically Signed   By: Claudie Revering M.D.   On: 07/23/2017 16:09   Dg Chest Port 1 View  Result Date: 07/22/2017 CLINICAL DATA:  Respiratory failure.  COPD. EXAM: PORTABLE CHEST 1 VIEW  COMPARISON:  07/19/2017. FINDINGS: The cardiac silhouette remains borderline enlarged. Stable left pleural effusion and left basilar airspace opacity. Small right pleural effusion, decreased, with decreased adjacent right basilar atelectasis. Thoracic spine degenerative changes. IMPRESSION: 1. Stable left pleural effusion and left lower lobe atelectasis or pneumonia. 2. Decreased right pleural fluid and right basilar atelectasis. Electronically Signed   By: Claudie Revering M.D.   On: 07/22/2017 09:14   Dg Chest Port 1 View  Result Date: 07/19/2017 CLINICAL DATA:  Respiratory failure EXAM: PORTABLE CHEST 1 VIEW COMPARISON:  July 15, 2017 FINDINGS: There are small pleural effusions bilaterally. There is consolidation in the left lower lobe. Lungs elsewhere are clear. Heart size is upper normal with pulmonary vascularity within normal limits. No adenopathy. There is degenerative change in the thoracic spine. IMPRESSION: Left lower lobe consolidation with small pleural effusions bilaterally. There is mild bibasilar atelectasis. Stable cardiac silhouette. Electronically Signed   By: Lowella Grip III M.D.   On: 07/19/2017 07:57   Dg Chest Port 1 View  Result Date: 07/15/2017 CLINICAL DATA:  Acute respiratory failure. EXAM: PORTABLE CHEST 1 VIEW COMPARISON:  Chest x-ray from yesterday FINDINGS: The heart size and mediastinal contours are within normal limits. Normal pulmonary vascularity. Unchanged left greater than right bibasilar atelectasis and scarring at the costophrenic angles. No focal consolidation, pleural effusion, or pneumothorax. No acute osseous abnormality. IMPRESSION: Stable bibasilar atelectasis/scarring. Electronically Signed   By: Titus Dubin M.D.   On: 07/15/2017 10:41   Dg Chest Portable 1 View  Result Date: 07/14/2017 CLINICAL DATA:  Shortness of breath. EXAM: PORTABLE CHEST 1 VIEW COMPARISON:  06/05/2017. FINDINGS: Mediastinum and hilar structures normal. Stable mild cardiomegaly  with normal pulmonary vascularity. Low lung volumes with mild basilar atelectasis. Stable bilateral pleural thickening consistent scarring. Degenerative changes thoracic spine. IMPRESSION: Low lung volumes with mild basilar atelectasis and/or pleuroparenchymal scarring again noted. Stable mild cardiomegaly. Chest is stable from prior exam. Electronically Signed   By: Marcello Moores  Register   On: 07/14/2017 17:13   [place] Ekg Site Rite  Result Date: 07/25/2017 If Site Rite image not attached, placement could not be confirmed due to current cardiac rhythm.    CBC Recent Labs  Lab 07/30/17 0459 08/01/17 0436 08/01/17 2342 08/02/17 0507 08/03/17 0539 08/04/17 0559  WBC 12.8* 9.3  --  13.4* 10.0 7.9  HGB 7.5* 7.1* 9.5* 9.2* 9.0* 8.2*  HCT 23.2* 22.7*  --  28.7* 27.9* 26.1*  PLT 353 346  --  322 268 257  MCV 90.9 92.2  --  90.9 91.7 91.4  MCH 29.2 28.9  --  29.2 29.7 28.8  MCHC 32.1 31.3*  --  32.2 32.4 31.5*  RDW 17.2* 16.8*  --  16.2* 15.7* 16.0*  LYMPHSABS 0.7* 0.7*  --  0.7* 0.6* 0.9*  MONOABS 0.7 0.5  --  0.7 0.6 0.6  EOSABS 0.2 0.2  --  0.2 0.1 0.2  BASOSABS 0.0 0.0  --  0.0 0.0 0.0    Chemistries  Recent Labs  Lab 07/29/17 0808 07/30/17 0459 08/01/17 0436 08/02/17 0507 08/03/17 0539 08/04/17 0559  NA 135  --  139 139 139 141  K 3.6 3.8 3.8 3.7 3.7 3.4*  CL 94*  --  100* 99* 99* 99*  CO2 32  --  34* 32 33* 35*  GLUCOSE 120*  --  120* 113* 118* 87  BUN 15  --  16 16 20 17   CREATININE <0.30*  --  <0.30* <0.30* 0.47 <0.30*  CALCIUM 8.4*  --  8.9 9.1 9.0 9.0  MG 1.8 1.7 1.7 1.5* 1.9 1.7   ------------------------------------------------------------------------------------------------------------------ CrCl cannot be calculated (This lab value cannot be used to calculate CrCl because it is not a number: <0.30). ------------------------------------------------------------------------------------------------------------------ No results for input(s): HGBA1C in the last 72  hours. ------------------------------------------------------------------------------------------------------------------ No results for input(s): CHOL, HDL, LDLCALC, TRIG, CHOLHDL, LDLDIRECT in the last 72 hours. ------------------------------------------------------------------------------------------------------------------ No results for input(s): TSH, T4TOTAL, T3FREE, THYROIDAB in the last 72 hours.  Invalid input(s): FREET3 ------------------------------------------------------------------------------------------------------------------ No results for input(s): VITAMINB12, FOLATE, FERRITIN, TIBC, IRON, RETICCTPCT in the last 72 hours.  Coagulation profile No results for input(s): INR, PROTIME in the last 168 hours.  No results for input(s): DDIMER in the last 72 hours.  Cardiac Enzymes No results for input(s): CKMB, TROPONINI, MYOGLOBIN in the last 168 hours.  Invalid input(s): CK ------------------------------------------------------------------------------------------------------------------ Invalid input(s): POCBNP    Assessment & Plan   62 year old female patient with history of chronic respiratory failure, GI bleed currently in the hospital for acute on chronic hypercapnic respiratory failure and anemia secondary to slow GI bleed.  1.Acute on chronic hypercapnic respiratory failure S/p tracheostomy on ventilator support, p continue vent support and weaning trials as tolerated as per ICU physician and respiratory therapist protocol t.    2. Moderate malnutrition  Feeding supplements, continue NG tube feedings.  3.  Moderate malnutrition   Continue NG tube feeding, appreciate GI following.,waiting for swallow eval 4. Electrolyte imbalance F/u magnesium and potassium  5.  Acute on chronic gastrointestinal bleeding s/p EGD 3/18 showing Normal duodenal bulb and second portion of the duodenum.Duodenal lipoma. Non-bleeding gastric ulcer with a clean ulcer base (Forrest  Class III).Normal gastroesophageal junction and esophagus. - protonix 40 mg IV bid - 6. DVT prophylaxis with SCD  7. Hypomagnesemia : Replace magnesium Replace electrolytes as per ICU protocol.  8.  Constipation, patient started on stool softeners, patient had a big BM yesterday.  9. euthyroid sick syndrome.:  Continue Synthyroid.  Code Status Orders  (From admission, onward)        Start     Ordered   07/14/17 2115  Full code  Continuous     07/14/17 2122    Code Status History    Date Active Date Inactive Code Status Order ID Comments User Context   06/05/2017 1654 06/08/2017 1820 Full Code [id number]  Bettey Costa, MD Inpatient   11/01/2016 0121 11/09/2016 2044 Full Code [id number]  Lance Coon, MD Inpatient       Time Spent in minutes   35 minutes  Greater than 50% of time spent in care coordination and counseling patient regarding the condition and plan of care.   Epifanio Lesches M.D on 08/04/2017 at 4:26 PM  Between 7am to 6pm - Pager - [phone]  After 6pm go to [email or web address] - Proofreader  [employer]   Office  [phone]

## 2017-08-04 NOTE — Progress Notes (Signed)
Pharmacy Antibiotic Note  Kayla Boyer is a 62 y.o. female admitted on 07/14/2017 with pneumonia.  Pharmacy has been consulted for cefepime dosing to cover possible HCAP. Patient transitioned from Zosyn to cefepime on 4/5; with total duration of antibiotics for 7 days. Procalcitonin 0.17.   Plan: Continue cefepime 1g IV Q8hr for total of 7 days of therapy.   Height: 5' 2.5" (158.8 cm) Weight: 126 lb 8.7 oz (57.4 kg) IBW/kg (Calculated) : 51.25  Temp (24hrs), Avg:98 F (36.7 C), Min:97.9 F (36.6 C), Max:98.1 F (36.7 C)  Recent Labs  Lab 07/29/17 0808 07/30/17 0459 08/01/17 0436 08/02/17 0507 08/03/17 0539 08/04/17 0559  WBC  --  12.8* 9.3 13.4* 10.0 7.9  CREATININE <0.30*  --  <0.30* <0.30* 0.47 <0.30*    CrCl cannot be calculated (This lab value cannot be used to calculate CrCl because it is not a number: <0.30).    No Known Allergies  Antimicrobials this admission: Zosyn 4/3 >> 4/5 Cefepime 4/5 >>   Dose adjustments this admission: N/A  Microbiology results: 4/3 Sputum: reincubated for better growth.  3/16 MRSA PCR: negative   Thank you for allowing pharmacy to be a part of this patient's care.  Baileigh Modisette L 08/04/2017 2:48 PM

## 2017-08-04 NOTE — Progress Notes (Signed)
OT Cancellation Note  Patient Details Name: Kayla Boyer MRN: [id number] DOB: 1955/05/20   Cancelled Treatment:    Reason Eval/Treat Not Completed: Patient declined, no reason specified Attempted to see patient for hand strengthening.  She was asleep upon OTs arrival and when given her paper and ped to write she indicated she felt SOB and NSG notified who called RT.  NSG reported that pt has been on pressure support and doing well is probably getting tired.  Continue OT as tolerated.  Chrys Racer, OTR/L ascom [phone] 08/04/17, 3:23 PM

## 2017-08-05 ENCOUNTER — Inpatient Hospital Stay: Payer: Medicaid Other

## 2017-08-05 LAB — GLUCOSE, CAPILLARY
GLUCOSE-CAPILLARY: 89 mg/dL (ref 65–99)
GLUCOSE-CAPILLARY: 124 mg/dL — AB (ref 65–99)
GLUCOSE-CAPILLARY: 90 mg/dL (ref 65–99)
Glucose-Capillary: 120 mg/dL — ABNORMAL HIGH (ref 65–99)
Glucose-Capillary: 76 mg/dL (ref 65–99)

## 2017-08-05 LAB — MAGNESIUM: Magnesium: 1.8 mg/dL (ref 1.7–2.4)

## 2017-08-05 LAB — BASIC METABOLIC PANEL
ANION GAP: 7 (ref 5–15)
BUN: 16 mg/dL (ref 6–20)
CHLORIDE: 99 mmol/L — AB (ref 101–111)
CO2: 30 mmol/L (ref 22–32)
Calcium: 9.1 mg/dL (ref 8.9–10.3)
Glucose, Bld: 99 mg/dL (ref 65–99)
POTASSIUM: 4.3 mmol/L (ref 3.5–5.1)
Sodium: 136 mmol/L (ref 135–145)

## 2017-08-05 LAB — CULTURE, RESPIRATORY W GRAM STAIN: Culture: NORMAL

## 2017-08-05 LAB — CBC
HCT: 28.6 % — ABNORMAL LOW (ref 35.0–47.0)
HEMOGLOBIN: 9 g/dL — AB (ref 12.0–16.0)
MCH: 28.9 pg (ref 26.0–34.0)
MCHC: 31.5 g/dL — ABNORMAL LOW (ref 32.0–36.0)
MCV: 91.6 fL (ref 80.0–100.0)
PLATELETS: 270 10*3/uL (ref 150–440)
RBC: 3.13 MIL/uL — AB (ref 3.80–5.20)
RDW: 15.7 % — ABNORMAL HIGH (ref 11.5–14.5)
WBC: 5.7 10*3/uL (ref 3.6–11.0)

## 2017-08-05 LAB — CULTURE, RESPIRATORY

## 2017-08-05 LAB — PHOSPHORUS: Phosphorus: 3.3 mg/dL (ref 2.5–4.6)

## 2017-08-05 MED ORDER — MORPHINE SULFATE (PF) 2 MG/ML IV SOLN
2.0000 mg | INTRAVENOUS | Status: DC | PRN
  Administered 2017-08-05: 2 mg via INTRAVENOUS
  Filled 2017-08-05: qty 1

## 2017-08-05 MED ORDER — FUROSEMIDE 10 MG/ML IJ SOLN
40.0000 mg | Freq: Once | INTRAMUSCULAR | Status: AC
  Administered 2017-08-05: 40 mg via INTRAVENOUS
  Filled 2017-08-05: qty 4

## 2017-08-05 NOTE — Progress Notes (Addendum)
[place] at [facility]                                                                                                                                                                                  Patient Demographics   Kayla Boyer, is a 62 y.o. female, DOB - 05-02-1956, PTW:[id number]  Admit date - 07/14/2017   Admitting Physician Saundra Shelling, MD  Outpatient Primary MD for the patient is Patient, [name]   LOS - 22  Subjective: Patient seen and evaluated today On tracheostomy with vent support Communicates by writing on the pad and on verbal commands No complaints of pain Has a NG tube feeding Mild drop in hemoglobin no evidence of any active bleed   Review of Systems:   CONSTITUTIONAL: No documented fever. No fatigue, weakness. No weight gain, no weight loss.  EYES: No blurry or double vision.  ENT: No tinnitus. No postnasal drip. No redness of the oropharynx.  RESPIRATORY: No cough, no wheeze, no hemoptysis. No dyspnea.  CARDIOVASCULAR: No chest pain. No orthopnea. No palpitations. No syncope.  GASTROINTESTINAL: No nausea, no vomiting or diarrhea. No abdominal pain. No melena or hematochezia.  GENITOURINARY: No dysuria or hematuria.  ENDOCRINE: No polyuria or nocturia. No heat or cold intolerance.  HEMATOLOGY: No anemia. No bruising. No bleeding.  INTEGUMENTARY: No rashes. No lesions.  MUSCULOSKELETAL: No arthritis. No swelling. No gout.  NEUROLOGIC: No numbness, tingling, or ataxia. No seizure-type activity.  PSYCHIATRIC: No anxiety. No insomnia. No ADD.    Vitals:   Vitals:   08/05/17 1000 08/05/17 1100 08/05/17 1200 08/05/17 1227  BP: 101/71 101/67 111/75   Pulse: 85 89 90   Resp: 16 15 20    Temp:    98.4 F (36.9 C)  TempSrc:    Axillary  SpO2: 100% 100% 97%   Weight:      Height:        Wt Readings from Last 3 Encounters:  08/05/17 57.3 kg (126 lb 5.2 oz)  07/05/17 60.8 kg (134 lb)  07/04/17 60.8 kg (134 lb)      Intake/Output Summary (Last 24 hours) at 08/05/2017 1230 Last data filed at 08/05/2017 1227 Gross per 24 hour  Intake 1805 ml  Output 1900 ml  Net -95 ml    Physical Exam:   GENERAL: Pleasant-appearing female patient lying on the bed with ventilator support via tracheostomy HEAD, EYES, EARS, NOSE AND THROAT: Atraumatic, normocephalic. Extraocular muscles are intact. Pupils equal and reactive to light. Sclerae anicteric. No conjunctival injection. No oro-pharyngeal erythema.  NECK: Supple. There is no jugular venous distention. No bruits, no lymphadenopathy, no thyromegaly.  Has tracheostomy, Has NGT  HEART: Regular rate and rhythm,. No murmurs, no rubs, no clicks.  LUNGS: Clear to auscultation bilaterally. No rales or rhonchi. No wheezes.  Has tracheostomy with ventilator support Tidal volume 450 rate 15 PEEP 5 FiO2 30% ABDOMEN: Soft, flat, nontender, nondistended. Has good bowel sounds. No hepatosplenomegaly appreciated.  EXTREMITIES: No evidence of any cyanosis, clubbing, or peripheral edema.  +2 pedal and radial pulses bilaterally.  NEUROLOGIC: The patient is alert, awake, and oriented x3 with no focal motor or sensory deficits appreciated bilaterally.  SKIN: Moist and warm with no rashes appreciated.  Psych: Not anxious, depressed LN: No inguinal LN enlargement    Antibiotics   Anti-infectives (From admission, onward)   Start     Dose/Rate Route Frequency Ordered Stop   08/04/17 2200  ceFEPIme (MAXIPIME) 1 g in sodium chloride 0.9 % 100 mL IVPB  Status:  Discontinued     1 g 200 mL/hr over 30 Minutes Intravenous Every 8 hours 08/04/17 1218 08/04/17 1448   08/04/17 2200  ceFEPIme (MAXIPIME) 1 g in sodium chloride 0.9 % 100 mL IVPB     1 g 200 mL/hr over 30 Minutes Intravenous Every 8 hours 08/04/17 1448 08/09/17 2159   08/03/17 0100  piperacillin-tazobactam (ZOSYN) IVPB 3.375 g  Status:  Discontinued     3.375 g 12.5 mL/hr over 240 Minutes Intravenous Every 8 hours  08/02/17 1701 08/02/17 1704   08/02/17 1730  piperacillin-tazobactam (ZOSYN) IVPB 3.375 g  Status:  Discontinued     3.375 g 12.5 mL/hr over 240 Minutes Intravenous Every 8 hours 08/02/17 1704 08/04/17 1217   08/02/17 1645  piperacillin-tazobactam (ZOSYN) IVPB 3.375 g  Status:  Discontinued     3.375 g 12.5 mL/hr over 240 Minutes Intravenous Every 8 hours 08/02/17 1639 08/02/17 1701   07/14/17 2200  azithromycin (ZITHROMAX) 500 mg in sodium chloride 0.9 % 250 mL IVPB  Status:  Discontinued     500 mg 250 mL/hr over 60 Minutes Intravenous Every 24 hours 07/14/17 2122 07/18/17 0915      Medications   Scheduled Meds: . chlorhexidine  15 mL Mouth Rinse BID  . sennosides  5 mL Per Tube BID   And  . docusate  50 mg Per Tube BID  . free water  200 mL Per Tube Q6H  . insulin aspart  0-9 Units Subcutaneous Q6H  . ipratropium-albuterol  3 mL Nebulization TID  . liver oil-zinc oxide   Topical Daily  . magnesium oxide  400 mg Per Tube BID  . mouth rinse  15 mL Mouth Rinse QID  . mirtazapine  15 mg Oral QHS  . multivitamin  15 mL Per Tube Daily  . pantoprazole sodium  40 mg Per Tube BID  . sodium chloride flush  10-40 mL Intracatheter Q12H  . thiamine  100 mg Per Tube Daily  . thyroid  30 mg Per Tube QAC breakfast   Continuous Infusions: . ceFEPime (MAXIPIME) IV Stopped (08/05/17 2951)  . dextrose 25 mL/hr at 08/05/17 0700  . feeding supplement (VITAL HIGH PROTEIN) 50 mL/hr at 08/05/17 0700   PRN Meds:.acetaminophen (TYLENOL) oral liquid 160 mg/5 mL, acetaminophen **OR** acetaminophen, ALPRAZolam, bisacodyl, metoprolol tartrate, morphine injection, [DISCONTINUED] ondansetron **OR** ondansetron (ZOFRAN) IV, simethicone, sodium chloride flush   Data Review:   Micro Results Recent Results (from the past 240 hour(s))  Culture, respiratory (NON-Expectorated)     Status: None (Preliminary result)   Collection Time: 08/02/17  6:23 PM  Result Value Ref Range Status   Specimen Description  Final    TRACHEAL ASPIRATE Performed at [facility], [address] [place], [place] [address]    Special Requests   Final    NONE Performed at [facility], [address] [place], [place] [address]    Gram Stain   Final    ABUNDANT WBC PRESENT, PREDOMINANTLY PMN FEW GRAM POSITIVE RODS    Culture   Final    CULTURE REINCUBATED FOR BETTER GROWTH Performed at [facility], [place] [address] [place], [place] [address]    Report Status PENDING  Incomplete    Radiology Reports Dg Abd 1 View  Result Date: 07/31/2017 CLINICAL DATA:  Nasogastric tube placement. EXAM: ABDOMEN - 1 VIEW COMPARISON:  Radiograph July 22, 2017. FINDINGS: The bowel gas pattern is normal. Distal tip of feeding tube is seen in distal stomach. No radio-opaque calculi or other significant radiographic abnormality are seen. IMPRESSION: Distal tip of nasogastric tube seen in distal stomach. No evidence of bowel obstruction or ileus. Electronically Signed   By: Marijo Conception, M.D.   On: 07/31/2017 11:47   Dg Abd 1 View  Result Date: 07/22/2017 CLINICAL DATA:  Encounter for feeding tube placement. EXAM: ABDOMEN - 1 VIEW COMPARISON:  Chest radiograph 07/22/2017 FINDINGS: A nasogastric tube has been advanced into the abdomen. Catheter tip is in the distal stomach region. Bowel gas throughout the abdomen. Persistent densities at the left lung base and the left hemidiaphragm remains obscured. IMPRESSION: Feeding tube tip in the distal stomach region. Electronically Signed   By: Markus Daft M.D.   On: 07/22/2017 16:30   Ct Head Wo Contrast  Result Date: 07/23/2017 CLINICAL DATA:  62 y/o F; changes to left pupil in strength of left arm. EXAM: CT HEAD WITHOUT CONTRAST TECHNIQUE: Contiguous axial images were obtained from the base of the skull through the vertex without intravenous contrast. COMPARISON:  None. FINDINGS: Brain: No evidence of acute infarction, hemorrhage, hydrocephalus, extra-axial  collection or mass lesion/mass effect. Vascular: Calcific atherosclerosis of carotid siphons. No hyperdense vessel. Skull: Normal. Negative for fracture or focal lesion. Sinuses/Orbits: No acute finding. Other: None. IMPRESSION: Negative CT of the head. Electronically Signed   By: Kristine Garbe M.D.   On: 07/23/2017 14:33   Nm Gi Blood Loss  Result Date: 07/20/2017 CLINICAL DATA:  GI bleeding EXAM: NUCLEAR MEDICINE GASTROINTESTINAL BLEEDING SCAN TECHNIQUE: Sequential abdominal images were obtained following intravenous administration of Tc-4m labeled red blood cells. RADIOPHARMACEUTICALS:  Twenty mCi Tc-91m pertechnetate in-vitro labeled red cells. COMPARISON:  None. FINDINGS: Normal distribution of radiotracer is noted. No focal area of tracer accumulation is identified to suggest active GI bleeding is noted. IMPRESSION: No findings to suggest active GI hemorrhage. Electronically Signed   By: Inez Catalina M.D.   On: 07/20/2017 15:16   US Venous Img Upper Bilat  Result Date: 07/16/2017 CLINICAL DATA:  Bilateral upper extremity edema acutely with pain and discomfort for 1 day EXAM: BILATERAL UPPER EXTREMITY VENOUS DOPPLER ULTRASOUND TECHNIQUE: Gray-scale sonography with graded compression, as well as color Doppler and duplex ultrasound were performed to evaluate the bilateral upper extremity deep venous systems from the level of the subclavian vein and including the jugular, axillary, basilic, radial, ulnar and upper cephalic vein. Spectral Doppler was utilized to evaluate flow at rest and with distal augmentation maneuvers. COMPARISON:  None. FINDINGS: Exam is limited because of upper extremity subcutaneous edema. RIGHT UPPER EXTREMITY Internal Jugular Vein: No evidence of thrombus. Normal compressibility, respiratory phasicity and response to augmentation. Subclavian  Vein: No evidence of thrombus. Normal compressibility, respiratory phasicity and response to augmentation. Axillary Vein: No  evidence of thrombus. Normal compressibility, respiratory phasicity and response to augmentation. Cephalic Vein: No evidence of thrombus. Normal compressibility, respiratory phasicity and response to augmentation. Basilic Vein: Not well visualized to accurately assess. Brachial Veins: No evidence of thrombus. Normal compressibility, respiratory phasicity and response to augmentation. Radial Veins: No evidence of thrombus. Normal compressibility, respiratory phasicity and response to augmentation. Ulnar Veins: No evidence of thrombus. Normal compressibility, respiratory phasicity and response to augmentation. Venous Reflux:  None. Other Findings:  Peripheral edema noted LEFT UPPER EXTREMITY Internal Jugular Vein: No evidence of thrombus. Normal compressibility, respiratory phasicity and response to augmentation. Subclavian Vein: No evidence of thrombus. Normal compressibility, respiratory phasicity and response to augmentation. Axillary Vein: No evidence of thrombus. Normal compressibility, respiratory phasicity and response to augmentation. Cephalic Vein: No evidence of thrombus. Normal compressibility, respiratory phasicity and response to augmentation. Basilic Vein: No evidence of thrombus. Normal compressibility, respiratory phasicity and response to augmentation. Brachial Veins: No evidence of thrombus. Normal compressibility, respiratory phasicity and response to augmentation. Radial Veins: No evidence of thrombus. Normal compressibility, respiratory phasicity and response to augmentation. Ulnar Veins: No evidence of thrombus. Normal compressibility, respiratory phasicity and response to augmentation. Venous Reflux:  None. Other Findings:  But peripheral subcutaneous edema noted. IMPRESSION: Limited exam but no gross occlusive upper extremity DVT in either arm. Electronically Signed   By: Jerilynn Mages.  Shick M.D.   On: 07/16/2017 12:34   US Venous Img Upper Uni Left  Result Date: 07/25/2017 CLINICAL DATA:  Left upper  extremity edema. EXAM: LEFT UPPER EXTREMITY VENOUS DOPPLER ULTRASOUND TECHNIQUE: Gray-scale sonography with graded compression, as well as color Doppler and duplex ultrasound were performed to evaluate the upper extremity deep venous system from the level of the subclavian vein and including the jugular, axillary, basilic, radial, ulnar and upper cephalic vein. Spectral Doppler was utilized to evaluate flow at rest and with distal augmentation maneuvers. COMPARISON:  None. FINDINGS: Contralateral Subclavian Vein: Respiratory phasicity is normal and symmetric with the symptomatic side. No evidence of thrombus. Normal compressibility. Internal Jugular Vein: No evidence of thrombus. Normal compressibility, respiratory phasicity and response to augmentation. Subclavian Vein: No evidence of thrombus. Normal compressibility, respiratory phasicity and response to augmentation. Axillary Vein: No evidence of thrombus. Normal compressibility, respiratory phasicity and response to augmentation. Cephalic Vein: No evidence of thrombus. Normal compressibility, respiratory phasicity and response to augmentation. Basilic Vein: No evidence of thrombus. Normal compressibility, respiratory phasicity and response to augmentation. Brachial Veins: No evidence of thrombus. Normal compressibility, respiratory phasicity and response to augmentation. Radial Veins: No evidence of thrombus. Normal compressibility, respiratory phasicity and response to augmentation. Ulnar Veins: No evidence of thrombus. Normal compressibility, respiratory phasicity and response to augmentation. Venous Reflux:  None visualized. Other Findings: There is significant edema noted in the left upper arm including some more locally marginated fluid in the subcutaneous tissues of the left medial upper arm. This fluid appears to extend over a long distance and likely represents subcutaneous fluid which is more prominent in 1 area of the upper arm. If there is any concern  for soft tissue infection, additional imaging may be warranted. IMPRESSION: 1. No evidence of left upper extremity deep venous thrombosis. 2. Significant subcutaneous fluid in the left upper arm with an area of more marginated subcutaneous fluid also identified in the medial left upper arm. Although this does not have the appearance of a focal abscess by ultrasound, if  there is concern for upper extremity infection, additional imaging may be warranted with CT and/or MRI. Electronically Signed   By: Aletta Edouard M.D.   On: 07/25/2017 17:48   Dg Chest Port 1 View  Result Date: 08/05/2017 CLINICAL DATA:  Acute respiratory failure EXAM: PORTABLE CHEST 1 VIEW COMPARISON:  08/04/2017 FINDINGS: Tracheostomy in good position. Right arm PICC tip in the mid SVC. NG tube in the stomach. Progression of right lower lobe atelectasis/infiltrate. Mild left lower lobe atelectasis slightly improved. Small right effusion. Negative for edema. IMPRESSION: Mild progression of right lower lobe airspace disease. Mild improvement in left lower lobe atelectasis. Electronically Signed   By: Franchot Gallo M.D.   On: 08/05/2017 07:33   Dg Chest Port 1 View  Result Date: 08/04/2017 CLINICAL DATA:  Pneumonia EXAM: PORTABLE CHEST 1 VIEW COMPARISON:  08/03/2017, 08/02/2017, 07/30/2017 FINDINGS: Tracheostomy tube is in place. Worsening airspace disease at the right lung base. No change in dense left lung base consolidation. Probable small effusions. Stable enlarged cardiomediastinal silhouette. IMPRESSION: 1. Worsening airspace disease at the right lung base may reflect worsening atelectasis or pneumonia 2. No change in dense left lower lobe atelectasis or pneumonia. Probable small pleural effusions. Electronically Signed   By: Donavan Foil M.D.   On: 08/04/2017 03:58   Dg Chest Port 1 View  Result Date: 08/03/2017 CLINICAL DATA:  Pneumonia EXAM: PORTABLE CHEST 1 VIEW COMPARISON:  08/02/2017 FINDINGS: Tracheostomy in good position.  Right arm PICC tip in the SVC. NG tube in the stomach. Bibasilar airspace disease unchanged. Small left effusion. Negative for edema. IMPRESSION: Bibasilar atelectasis/infiltrate unchanged. Support lines remain in good position. Electronically Signed   By: Franchot Gallo M.D.   On: 08/03/2017 07:14   Dg Chest Port 1 View  Result Date: 08/02/2017 CLINICAL DATA:  Shortness of Breath EXAM: PORTABLE CHEST 1 VIEW COMPARISON:  August 01, 2017 FINDINGS: Tracheostomy catheter tip is 5.0 cm above the carina. Central catheter tip is in the superior vena cava. Nasogastric tube tip and side port are below the diaphragm. No pneumothorax. There is airspace consolidation left lower lobe with small left pleural effusion. There is mild right base atelectasis. There is cardiomegaly with pulmonary vascularity within normal limits. No adenopathy. No bone lesions. IMPRESSION: Tube and catheter positions as described without evident pneumothorax. Airspace consolidation consistent with pneumonia left lower lobe with small left pleural effusion. Mild right base atelectasis. Stable cardiac prominence. Electronically Signed   By: Lowella Grip III M.D.   On: 08/02/2017 07:55   Dg Chest Port 1 View  Result Date: 08/01/2017 CLINICAL DATA:  Pneumonia EXAM: PORTABLE CHEST 1 VIEW COMPARISON:  07/30/2017, 07/29/2017, 07/27/2017, 07/23/2017 FINDINGS: Tracheostomy tube remains in place. Esophageal tube tip is below the diaphragm but non included. Interval opacification of the right diaphragm. No change in dense left lung base consolidation. Probable tiny pleural effusions. Enlarged cardiomediastinal silhouette is unchanged. Right upper extremity catheter tip overlies the SVC. IMPRESSION: 1. Increased opacity at the right base may reflect atelectasis 2. No significant change in probable small left effusion and dense left lung base consolidation, atelectasis versus pneumonia. 3. Stable cardiomegaly Electronically Signed   By: Donavan Foil  M.D.   On: 08/01/2017 02:56   Dg Chest Port 1 View  Result Date: 07/30/2017 CLINICAL DATA:  Pneumonia EXAM: PORTABLE CHEST 1 VIEW COMPARISON:  07/29/2017 and prior radiographs FINDINGS: A tracheostomy tube, RIGHT PICC line with tip overlying the mid-LOWER SVC and endotracheal tube with tip overlying the distal esophagus again  noted. Decreased LEFT LOWER lung consolidation/atelectasis noted. Mild RIGHT basilar atelectasis again noted. There is no evidence of pneumothorax. No other interval changes identified. IMPRESSION: 1. Decreased LEFT LOWER lung consolidation/atelectasis. 2. NG tube with tip overlying the distal esophagus-recommend advancement. Electronically Signed   By: Margarette Canada M.D.   On: 07/30/2017 07:49   Dg Chest Port 1 View  Result Date: 07/29/2017 CLINICAL DATA:  Acute on chronic respiratory failure. EXAM: PORTABLE CHEST 1 VIEW COMPARISON:  Radiograph July 27, 2017. FINDINGS: Stable cardiomegaly. Tracheostomy is in grossly good position. Distal tip of nasogastric tube is seen in distal esophagus. No pneumothorax is noted. Right-sided PICC line is noted with distal tip in expected position of SVC. Mild bibasilar subsegmental atelectasis is noted. Bony thorax is unremarkable. IMPRESSION: Mild bibasilar subsegmental atelectasis. Endotracheal tube in grossly good position. Distal tip of nasogastric tube has withdrawn into distal esophagus. Electronically Signed   By: Marijo Conception, M.D.   On: 07/29/2017 22:01   Dg Chest Port 1 View  Result Date: 07/27/2017 CLINICAL DATA:  Excessive fluid EXAM: PORTABLE CHEST 1 VIEW COMPARISON:  Chest x-ray of July 23, 2017 FINDINGS: The lungs are well-expanded. There is a trace of pleural fluid on the right and slightly more on the left. The retrocardiac region remains dense. The cardiac silhouette is enlarged. The central pulmonary vascularity is prominent. The mediastinum is normal in width. The trachea is midline. The right-sided PICC line tip projects  over the midportion of the SVC. The tracheostomy tube tip projects at the superior margin of the clavicular heads. The bony thorax exhibits no acute abnormality. IMPRESSION: Slight improved aeration of both lungs today. Small amounts of pleural fluid at both bases. Persistent atelectasis or pneumonia in the left lower lobe. Top-normal cardiac size with central pulmonary vascular congestion but no definite pulmonary edema. Electronically Signed   By: David  Martinique M.D.   On: 07/27/2017 10:55   Dg Chest Port 1 View  Result Date: 07/23/2017 CLINICAL DATA:  Acute on chronic respiratory failure. Airway aspiration. EXAM: PORTABLE CHEST 1 VIEW COMPARISON:  07/22/2017. FINDINGS: Interval enlargement of the cardiac silhouette. Stable dense left lower lobe airspace opacity. Interval increased linear and patchy density at the right lung base. Stable small bilateral pleural effusions. Nasogastric tube extending into the stomach. Thoracic spine degenerative changes. IMPRESSION: 1. No significant change in dense left lower lobe atelectasis or pneumonia and small left pleural effusion. 2. Increased right basilar atelectasis or pneumonia with a stable small right pleural effusion. Electronically Signed   By: Claudie Revering M.D.   On: 07/23/2017 16:09   Dg Chest Port 1 View  Result Date: 07/22/2017 CLINICAL DATA:  Respiratory failure.  COPD. EXAM: PORTABLE CHEST 1 VIEW COMPARISON:  07/19/2017. FINDINGS: The cardiac silhouette remains borderline enlarged. Stable left pleural effusion and left basilar airspace opacity. Small right pleural effusion, decreased, with decreased adjacent right basilar atelectasis. Thoracic spine degenerative changes. IMPRESSION: 1. Stable left pleural effusion and left lower lobe atelectasis or pneumonia. 2. Decreased right pleural fluid and right basilar atelectasis. Electronically Signed   By: Claudie Revering M.D.   On: 07/22/2017 09:14   Dg Chest Port 1 View  Result Date: 07/19/2017 CLINICAL  DATA:  Respiratory failure EXAM: PORTABLE CHEST 1 VIEW COMPARISON:  July 15, 2017 FINDINGS: There are small pleural effusions bilaterally. There is consolidation in the left lower lobe. Lungs elsewhere are clear. Heart size is upper normal with pulmonary vascularity within normal limits. No adenopathy. There is degenerative change in  the thoracic spine. IMPRESSION: Left lower lobe consolidation with small pleural effusions bilaterally. There is mild bibasilar atelectasis. Stable cardiac silhouette. Electronically Signed   By: Lowella Grip III M.D.   On: 07/19/2017 07:57   Dg Chest Port 1 View  Result Date: 07/15/2017 CLINICAL DATA:  Acute respiratory failure. EXAM: PORTABLE CHEST 1 VIEW COMPARISON:  Chest x-ray from yesterday FINDINGS: The heart size and mediastinal contours are within normal limits. Normal pulmonary vascularity. Unchanged left greater than right bibasilar atelectasis and scarring at the costophrenic angles. No focal consolidation, pleural effusion, or pneumothorax. No acute osseous abnormality. IMPRESSION: Stable bibasilar atelectasis/scarring. Electronically Signed   By: Titus Dubin M.D.   On: 07/15/2017 10:41   Dg Chest Portable 1 View  Result Date: 07/14/2017 CLINICAL DATA:  Shortness of breath. EXAM: PORTABLE CHEST 1 VIEW COMPARISON:  06/05/2017. FINDINGS: Mediastinum and hilar structures normal. Stable mild cardiomegaly with normal pulmonary vascularity. Low lung volumes with mild basilar atelectasis. Stable bilateral pleural thickening consistent scarring. Degenerative changes thoracic spine. IMPRESSION: Low lung volumes with mild basilar atelectasis and/or pleuroparenchymal scarring again noted. Stable mild cardiomegaly. Chest is stable from prior exam. Electronically Signed   By: Marcello Moores  Register   On: 07/14/2017 17:13   Dg Abd Portable 1v  Result Date: 08/04/2017 CLINICAL DATA:  Nasogastric placement. EXAM: PORTABLE ABDOMEN - 1 VIEW COMPARISON:  07/31/2017 FINDINGS:  Nasogastric tube enters the stomach, curves along the greater curvature and has its tip in the antrum. Moderate amount of intestinal gas is noted without obstructive pattern. IMPRESSION: Nasogastric tube tip in the gastric antrum. Electronically Signed   By: Nelson Chimes M.D.   On: 08/04/2017 19:23   [place] Ekg Site Rite  Result Date: 07/25/2017 If Site Rite image not attached, placement could not be confirmed due to current cardiac rhythm.    CBC Recent Labs  Lab 07/30/17 0459 08/01/17 0436 08/01/17 2342 08/02/17 0507 08/03/17 0539 08/04/17 0559 08/05/17 0754  WBC 12.8* 9.3  --  13.4* 10.0 7.9 5.7  HGB 7.5* 7.1* 9.5* 9.2* 9.0* 8.2* 9.0*  HCT 23.2* 22.7*  --  28.7* 27.9* 26.1* 28.6*  PLT 353 346  --  322 268 257 270  MCV 90.9 92.2  --  90.9 91.7 91.4 91.6  MCH 29.2 28.9  --  29.2 29.7 28.8 28.9  MCHC 32.1 31.3*  --  32.2 32.4 31.5* 31.5*  RDW 17.2* 16.8*  --  16.2* 15.7* 16.0* 15.7*  LYMPHSABS 0.7* 0.7*  --  0.7* 0.6* 0.9*  --   MONOABS 0.7 0.5  --  0.7 0.6 0.6  --   EOSABS 0.2 0.2  --  0.2 0.1 0.2  --   BASOSABS 0.0 0.0  --  0.0 0.0 0.0  --     Chemistries  Recent Labs  Lab 08/01/17 0436 08/02/17 0507 08/03/17 0539 08/04/17 0559 08/05/17 0754  NA 139 139 139 141 136  K 3.8 3.7 3.7 3.4* 4.3  CL 100* 99* 99* 99* 99*  CO2 34* 32 33* 35* 30  GLUCOSE 120* 113* 118* 87 99  BUN 16 16 20 17 16   CREATININE <0.30* <0.30* 0.47 <0.30* <0.30*  CALCIUM 8.9 9.1 9.0 9.0 9.1  MG 1.7 1.5* 1.9 1.7 1.8   ------------------------------------------------------------------------------------------------------------------ CrCl cannot be calculated (This lab value cannot be used to calculate CrCl because it is not a number: <0.30). ------------------------------------------------------------------------------------------------------------------ No results for input(s): HGBA1C in the last 72  hours. ------------------------------------------------------------------------------------------------------------------ No results for input(s): CHOL, HDL, LDLCALC, TRIG, CHOLHDL, LDLDIRECT in  the last 72 hours. ------------------------------------------------------------------------------------------------------------------ No results for input(s): TSH, T4TOTAL, T3FREE, THYROIDAB in the last 72 hours.  Invalid input(s): FREET3 ------------------------------------------------------------------------------------------------------------------ No results for input(s): VITAMINB12, FOLATE, FERRITIN, TIBC, IRON, RETICCTPCT in the last 72 hours.  Coagulation profile No results for input(s): INR, PROTIME in the last 168 hours.  No results for input(s): DDIMER in the last 72 hours.  Cardiac Enzymes No results for input(s): CKMB, TROPONINI, MYOGLOBIN in the last 168 hours.  Invalid input(s): CK ------------------------------------------------------------------------------------------------------------------ Invalid input(s): POCBNP    Assessment & Plan   62 year old female patient with history of chronic respiratory failure, GI bleed currently in the hospital for acute on chronic hypercapnic respiratory failure and anemia secondary to slow GI bleed.  1.Acute on chronic hypercapnic respiratory failure S/p tracheostomy on ventilator support,  weaning trials as tolerated as per ICU physician and respiratory therapist protocol . On IV Zosyn antibiotic empirically  2.  Moderate malnutrition   Continue NG tube feeding, appreciate GI following.,waiting for swallow eval  3. Electrolyte imbalance F/u magnesium and potassium  4.  Acute on chronic gastrointestinal bleeding s/p EGD 3/18 showing Normal duodenal bulb and second portion of the duodenum.Duodenal lipoma. Non-bleeding gastric ulcer with a clean ulcer base (Forrest Class III).Normal gastroesophageal junction and esophagus. -  protonix 40 mg IV bid - 5. DVT prophylaxis with SCD  6.  Constipation, patient started on stool softeners, patient had a big BM yesterday.  7. euthyroid sick syndrome.:  Continue Synthyroid.  8.  Hypokalemia replace potassium        Code Status Orders  (From admission, onward)        Start     Ordered   07/14/17 2115  Full code  Continuous     07/14/17 2122    Code Status History    Date Active Date Inactive Code Status Order ID Comments User Context   06/05/2017 1654 06/08/2017 1820 Full Code [id number]  Bettey Costa, MD Inpatient   11/01/2016 0121 11/09/2016 2044 Full Code [id number]  Lance Coon, MD Inpatient      Time Spent in minutes   35  Greater than 50% of time spent in care coordination and counseling patient regarding the condition and plan of care.   Saundra Shelling M.D on 08/05/2017 at 12:30 PM  Between 7am to 6pm - Pager - [phone]  After 6pm go to [email or web address] - Proofreader  [employer]   Office  [phone]

## 2017-08-05 NOTE — Progress Notes (Signed)
Pharmacy Antibiotic Note  Kayla Boyer is a 62 y.o. female admitted on 07/14/2017 with pneumonia.  Pharmacy has been consulted for cefepime dosing to cover possible HCAP. Patient transitioned from Zosyn to cefepime on 4/5; with total duration of antibiotics for 7 days. Procalcitonin 0.17.   Plan: Continue cefepime 1g IV Q8hr for total of 7 days of therapy.   Height: 5' 2.5" (158.8 cm) Weight: 126 lb 5.2 oz (57.3 kg) IBW/kg (Calculated) : 51.25  Temp (24hrs), Avg:98.4 F (36.9 C), Min:97.9 F (36.6 C), Max:98.9 F (37.2 C)  Recent Labs  Lab 08/01/17 0436 08/02/17 0507 08/03/17 0539 08/04/17 0559 08/05/17 0754  WBC 9.3 13.4* 10.0 7.9 5.7  CREATININE <0.30* <0.30* 0.47 <0.30* <0.30*    CrCl cannot be calculated (This lab value cannot be used to calculate CrCl because it is not a number: <0.30).    No Known Allergies  Antimicrobials this admission: Zosyn 4/3 >> 4/5 Cefepime 4/5 >>   Dose adjustments this admission: N/A  Microbiology results: 4/3 Sputum: reincubated for better growth.  3/16 MRSA PCR: negative   Thank you for allowing pharmacy to be a part of this patient's care.  Rodolph Hagemann A 08/05/2017 11:28 AM

## 2017-08-05 NOTE — Progress Notes (Signed)
[facility] Progess Note    SYNOPSIS   Patient with underlying history of severe emphysema, hypertension, GI bleed, status post endoscopy, respiratory failure, pneumonia, tracheostomy on mechanical ventilation.  ASSESSMENT/PLAN   Respiratory failure. Patient is presently on mechanical ventilation, albuterol, Atrovent, Zosyn empirically. Will try to progress ventilator weaning as tolerated  GI bleed. Mild decrease in hemoglobin, no clear evidence of active bleeding at this time. Is on a PPI and has been seenogy.  Hypokalemia. We'll replace    VENTILATOR SETTINGS: Vent Mode: PRVC FiO2 (%):  [30 %] 30 % Set Rate:  [15 bmp] 15 bmp Vt Set:  [450 mL] 450 mL PEEP:  [5 cmH20] 5 cmH20 Pressure Support:  [15 cmH20] 15 cmH20 Plateau Pressure:  [18 cmH20-20 cmH20] 20 cmH20  INTAKE / OUTPUT:  Intake/Output Summary (Last 24 hours) at 08/05/2017 0802 Last data filed at 08/05/2017 0600 Gross per 24 hour  Intake 1075 ml  Output 1600 ml  Net -525 ml    Name: Kayla Boyer MRN: [id number] DOB: [birth year]    ADMISSION DATE:  07/14/2017  SUBJECTIVE:   Pt currently on the ventilator. Awake, alert, communicating voicing no complaints at this time  VITAL SIGNS: Temp:  [97.9 F (36.6 C)-98.1 F (36.7 C)] 97.9 F (36.6 C) (04/05 1600) Pulse Rate:  [79-97] 95 (04/05 1900) Resp:  [13-22] 15 (04/05 1600) BP: (91-133)/(64-100) 133/100 (04/05 1900) SpO2:  [97 %-100 %] 97 % (04/06 0400) FiO2 (%):  [30 %] 30 % (04/06 0400) Weight:  [57.3 kg (126 lb 5.2 oz)] 57.3 kg (126 lb 5.2 oz) (04/06 0604)  PHYSICAL EXAMINATION: Physical Examination:   VS: BP (!) 133/100   Pulse 95   Temp 97.9 F (36.6 C) (Oral)   Resp 15   Ht 5' 2.5" (1.588 m)   Wt 57.3 kg (126 lb 5.2 oz)   SpO2 97%   BMI 22.74 kg/m   General Appearance: No distress  Neuro:without focal findings, mental status normal. HEENT: tracheostomy in place on mechanical ventilation Pulmonary: relatively clear,  right lower lobe posterior inspiratory crackles appreciated CardiovascularNormal S1,S2.  No m/r/g.   Abdomen: Soft positive bowel sounds. Skin:   warm, no rashes, no ecchymosis  Extremities: normal, no cyanosis, clubbing.    LABORATORY PANEL:   CBC Recent Labs  Lab 08/04/17 0559  WBC 7.9  HGB 8.2*  HCT 26.1*  PLT 257    Chemistries  Recent Labs  Lab 08/04/17 0559  NA 141  K 3.4*  CL 99*  CO2 35*  GLUCOSE 87  BUN 17  CREATININE <0.30*  CALCIUM 9.0  MG 1.7  PHOS 3.8    Recent Labs  Lab 08/03/17 1754 08/03/17 2350 08/04/17 0555 08/04/17 1159 08/04/17 1819 08/05/17 0046  GLUCAP 77 108* 86 84 79 76   Recent Labs  Lab 08/01/17 0515 08/03/17 0525  PHART 7.50* 7.53*  PCO2ART 48 41  PO2ART 105 154*   No results for input(s): AST, ALT, ALKPHOS, BILITOT, ALBUMIN in the last 168 hours.  Cardiac Enzymes No results for input(s): TROPONINI in the last 168 hours.  RADIOLOGY:  Dg Chest Port 1 View  Result Date: 08/05/2017 CLINICAL DATA:  Acute respiratory failure EXAM: PORTABLE CHEST 1 VIEW COMPARISON:  08/04/2017 FINDINGS: Tracheostomy in good position. Right arm PICC tip in the mid SVC. NG tube in the stomach. Progression of right lower lobe atelectasis/infiltrate. Mild left lower lobe atelectasis slightly improved. Small right effusion. Negative for edema. IMPRESSION: Mild progression of right lower lobe airspace  disease. Mild improvement in left lower lobe atelectasis. Electronically Signed   By: Franchot Gallo M.D.   On: 08/05/2017 07:33   Dg Chest Port 1 View  Result Date: 08/04/2017 CLINICAL DATA:  Pneumonia EXAM: PORTABLE CHEST 1 VIEW COMPARISON:  08/03/2017, 08/02/2017, 07/30/2017 FINDINGS: Tracheostomy tube is in place. Worsening airspace disease at the right lung base. No change in dense left lung base consolidation. Probable small effusions. Stable enlarged cardiomediastinal silhouette. IMPRESSION: 1. Worsening airspace disease at the right lung base may  reflect worsening atelectasis or pneumonia 2. No change in dense left lower lobe atelectasis or pneumonia. Probable small pleural effusions. Electronically Signed   By: Donavan Foil M.D.   On: 08/04/2017 03:58   Dg Abd Portable 1v  Result Date: 08/04/2017 CLINICAL DATA:  Nasogastric placement. EXAM: PORTABLE ABDOMEN - 1 VIEW COMPARISON:  07/31/2017 FINDINGS: Nasogastric tube enters the stomach, curves along the greater curvature and has its tip in the antrum. Moderate amount of intestinal gas is noted without obstructive pattern. IMPRESSION: Nasogastric tube tip in the gastric antrum. Electronically Signed   By: Nelson Chimes M.D.   On: 08/04/2017 19:23    Hermelinda Dellen, DO  08/05/2017

## 2017-08-05 NOTE — Progress Notes (Signed)
Pharmacy Electrolyte Monitoring Consult:  Pharmacy consulted to assist in monitoring and replacing electrolytes in this 62 y.o. female admitted on 07/14/2017 with Weakness   Labs:  Sodium (mmol/L)  Date Value  08/05/2017 136   Potassium (mmol/L)  Date Value  08/05/2017 4.3   Magnesium (mg/dL)  Date Value  08/05/2017 1.8   Phosphorus (mg/dL)  Date Value  08/05/2017 3.3   Calcium (mg/dL)  Date Value  08/05/2017 9.1   Albumin (g/dL)  Date Value  07/26/2017 2.5 (L)    Plan: Magnesium is trending down, will order magnesium 400mg  VT Q12hr x 4 doses.  Will order additional potassium 61mEq VT x 1.   4/6:  K 4.3,  Mag 1.8, Phos 3.3 Patient on Mag-Ox 400 mg po bid x 4 doses started 08/04/17. No supplementation at this time Will recheck electrolytes with am labs.   Pharmacy will continue to monitor and adjust per consult.   Kayla Boyer 08/05/2017 11:29 AM

## 2017-08-05 NOTE — Plan of Care (Signed)
Patient tolerating trach collar well.  No distress noted.  Suctions herself as needed.  Makes needs known. No acute distress noted.  Will continue to monitor.

## 2017-08-06 ENCOUNTER — Inpatient Hospital Stay: Payer: Medicaid Other

## 2017-08-06 LAB — GLUCOSE, CAPILLARY
GLUCOSE-CAPILLARY: 110 mg/dL — AB (ref 65–99)
GLUCOSE-CAPILLARY: 98 mg/dL (ref 65–99)
Glucose-Capillary: 88 mg/dL (ref 65–99)
Glucose-Capillary: 93 mg/dL (ref 65–99)
Glucose-Capillary: 95 mg/dL (ref 65–99)
Glucose-Capillary: 98 mg/dL (ref 65–99)

## 2017-08-06 LAB — BASIC METABOLIC PANEL
Anion gap: 8 (ref 5–15)
BUN: 18 mg/dL (ref 6–20)
CALCIUM: 9.4 mg/dL (ref 8.9–10.3)
CHLORIDE: 97 mmol/L — AB (ref 101–111)
CO2: 33 mmol/L — AB (ref 22–32)
Glucose, Bld: 99 mg/dL (ref 65–99)
Potassium: 3.6 mmol/L (ref 3.5–5.1)
SODIUM: 138 mmol/L (ref 135–145)

## 2017-08-06 LAB — PROCALCITONIN: Procalcitonin: 0.13 ng/mL

## 2017-08-06 NOTE — Plan of Care (Signed)
Patient continues on trach/vent.  Makes needs known.  Tolerating trach well. Morphine given x 1 for SOB.  No acute distress this shift. Will continue to monitor.

## 2017-08-06 NOTE — Progress Notes (Signed)
[facility] Progess Note    SYNOPSIS   Patient with underlying history of severe emphysema, hypertension, GI bleed, status post endoscopy, respiratory failure, pneumonia, tracheostomy on mechanical ventilation.  ASSESSMENT/PLAN   Respiratory failure. Patient is presently on mechanical ventilation, albuterol, Atrovent, Zosyn empirically.  Patient with right lower lobe atelectasis/effusion/pneumonia on the right.  Presently on antibiotic therapy, will diurese as tolerated, will try to progress ventilator weaning as tolerated  GI bleed. Mild decrease in hemoglobin, no clear evidence of active bleeding at this time. Is on a PPI and has been seenogy.   VENTILATOR SETTINGS: Vent Mode: PRVC FiO2 (%):  [30 %-40 %] 40 % Set Rate:  [15 bmp] 15 bmp Vt Set:  [450 mL] 450 mL PEEP:  [5 cmH20] 5 cmH20 Plateau Pressure:  [19 cmH20] 19 cmH20  INTAKE / OUTPUT:  Intake/Output Summary (Last 24 hours) at 08/06/2017 0815 Last data filed at 08/06/2017 0700 Gross per 24 hour  Intake 2025 ml  Output 2750 ml  Net -725 ml    Name: Kayla Boyer MRN: [id number] DOB: 1955/08/05    ADMISSION DATE:  07/14/2017  SUBJECTIVE:   Pt currently on the ventilator. Awake, alert, communicating voicing no complaints at this time  VITAL SIGNS: Temp:  [97.7 F (36.5 C)-98.8 F (37.1 C)] 98.8 F (37.1 C) (04/07 0200) Pulse Rate:  [85-104] 90 (04/07 0700) Resp:  [15-27] 15 (04/07 0700) BP: (92-138)/(58-100) 105/77 (04/07 0700) SpO2:  [87 %-100 %] 97 % (04/07 0807) FiO2 (%):  [30 %-40 %] 40 % (04/07 0807) Weight:  [57.5 kg (126 lb 12.8 oz)] 57.5 kg (126 lb 12.8 oz) (04/07 0500)  PHYSICAL EXAMINATION: Physical Examination:   VS: BP 105/77   Pulse 90   Temp 98.8 F (37.1 C) (Oral)   Resp 15   Ht 5' 2.5" (1.588 m)   Wt 57.5 kg (126 lb 12.8 oz)   SpO2 97%   BMI 22.82 kg/m   General Appearance: No distress  Neuro:without focal findings, mental status normal. HEENT: tracheostomy in  place on mechanical ventilation Pulmonary: relatively clear, right lower lobe posterior inspiratory crackles appreciated CardiovascularNormal S1,S2.  No m/r/g.   Abdomen: Soft positive bowel sounds. Skin:   warm, no rashes, no ecchymosis  Extremities: normal, no cyanosis, clubbing.    LABORATORY PANEL:   CBC Recent Labs  Lab 08/05/17 0754  WBC 5.7  HGB 9.0*  HCT 28.6*  PLT 270    Chemistries  Recent Labs  Lab 08/05/17 0754 08/06/17 0515  NA 136 138  K 4.3 3.6  CL 99* 97*  CO2 30 33*  GLUCOSE 99 99  BUN 16 18  CREATININE <0.30* <0.30*  CALCIUM 9.1 9.4  MG 1.8  --   PHOS 3.3  --     Recent Labs  Lab 08/05/17 0759 08/05/17 1149 08/05/17 1737 08/05/17 1824 08/06/17 0015 08/06/17 0523  GLUCAP 95 124* 90 120* 88 98   Recent Labs  Lab 08/01/17 0515 08/03/17 0525  PHART 7.50* 7.53*  PCO2ART 48 41  PO2ART 105 154*   No results for input(s): AST, ALT, ALKPHOS, BILITOT, ALBUMIN in the last 168 hours.  Cardiac Enzymes No results for input(s): TROPONINI in the last 168 hours.  RADIOLOGY:  Dg Chest Port 1 View  Result Date: 08/05/2017 CLINICAL DATA:  Acute respiratory failure EXAM: PORTABLE CHEST 1 VIEW COMPARISON:  08/04/2017 FINDINGS: Tracheostomy in good position. Right arm PICC tip in the mid SVC. NG tube in the stomach. Progression of right lower  lobe atelectasis/infiltrate. Mild left lower lobe atelectasis slightly improved. Small right effusion. Negative for edema. IMPRESSION: Mild progression of right lower lobe airspace disease. Mild improvement in left lower lobe atelectasis. Electronically Signed   By: Franchot Gallo M.D.   On: 08/05/2017 07:33   Dg Abd Portable 1v  Result Date: 08/04/2017 CLINICAL DATA:  Nasogastric placement. EXAM: PORTABLE ABDOMEN - 1 VIEW COMPARISON:  07/31/2017 FINDINGS: Nasogastric tube enters the stomach, curves along the greater curvature and has its tip in the antrum. Moderate amount of intestinal gas is noted without  obstructive pattern. IMPRESSION: Nasogastric tube tip in the gastric antrum. Electronically Signed   By: Nelson Chimes M.D.   On: 08/04/2017 19:23    Hermelinda Dellen, DO  08/06/2017

## 2017-08-06 NOTE — Progress Notes (Signed)
[place] [facility]                                                                                                                                                                                  Patient Demographics   Kayla Boyer, is a 62 y.o. female, DOB - 18-Nov-1955, FXT:[id number]  Admit date - 07/14/2017   Admitting Physician Saundra Shelling, MD  Outpatient Primary MD for the patient is Patient, [name]   LOS - 23  Subjective: Patient seen and evaluated today On tracheostomy with vent support Communicates by writing on the pad and on verbal commands No complaints of pain Has a NG tube feeding Had 3 loose bowel movements last night.   Review of Systems:   CONSTITUTIONAL: No documented fever. No fatigue, weakness. No weight gain, no weight loss.  EYES: No blurry or double vision.  ENT: No tinnitus. No postnasal drip. No redness of the oropharynx.  Has tracheostomy RESPIRATORY: No cough, no wheeze, no hemoptysis. No dyspnea.  CARDIOVASCULAR: No chest pain. No orthopnea. No palpitations. No syncope.  GASTROINTESTINAL: No nausea, no vomiting or diarrhea. No abdominal pain. No melena or hematochezia. Had diarrhoea  GENITOURINARY: No dysuria or hematuria.  ENDOCRINE: No polyuria or nocturia. No heat or cold intolerance.  HEMATOLOGY: No anemia. No bruising. No bleeding.  INTEGUMENTARY: No rashes. No lesions.  MUSCULOSKELETAL: No arthritis. No swelling. No gout.  NEUROLOGIC: No numbness, tingling, or ataxia. No seizure-type activity.  PSYCHIATRIC: No anxiety. No insomnia. No ADD.    Vitals:   Vitals:   08/06/17 0700 08/06/17 0800 08/06/17 0807 08/06/17 0900  BP: 105/77 90/66  100/65  Pulse: 90 95  96  Resp: 15 15  15   Temp:  99.5 F (37.5 C)    TempSrc:  Axillary    SpO2: 100% 100% 97% 100%  Weight:      Height:        Wt Readings from Last 3 Encounters:  08/06/17 57.5 kg (126 lb 12.8 oz)  07/05/17 60.8 kg (134 lb)  07/04/17 60.8  kg (134 lb)     Intake/Output Summary (Last 24 hours) at 08/06/2017 1021 Last data filed at 08/06/2017 0800 Gross per 24 hour  Intake 1950 ml  Output 2950 ml  Net -1000 ml    Physical Exam:   GENERAL: Pleasant-appearing female patient lying on the bed with ventilator support via tracheostomy HEAD, EYES, EARS, NOSE AND THROAT: Atraumatic, normocephalic. Extraocular muscles are intact. Pupils equal and reactive to light. Sclerae anicteric. No conjunctival injection. No oro-pharyngeal erythema.  NECK: Supple. There is no jugular venous distention. No bruits, no lymphadenopathy, no thyromegaly.  Has tracheostomy, Has  NGT HEART: Regular rate and rhythm,. No murmurs, no rubs, no clicks.  LUNGS: Clear to auscultation bilaterally. No rales or rhonchi. No wheezes.  Has tracheostomy with ventilator support Tidal volume 450 rate 15 PEEP 5 FiO2 30% ABDOMEN: Soft, flat, nontender, nondistended. Has good bowel sounds. No hepatosplenomegaly appreciated.  EXTREMITIES: No evidence of any cyanosis, clubbing, or peripheral edema.  +2 pedal and radial pulses bilaterally.  NEUROLOGIC: The patient is alert, awake, and oriented x3 with no focal motor or sensory deficits appreciated bilaterally.  SKIN: Moist and warm with no rashes appreciated.  Psych: Not anxious, depressed LN: No inguinal LN enlargement    Antibiotics   Anti-infectives (From admission, onward)   Start     Dose/Rate Route Frequency Ordered Stop   08/04/17 2200  ceFEPIme (MAXIPIME) 1 g in sodium chloride 0.9 % 100 mL IVPB  Status:  Discontinued     1 g 200 mL/hr over 30 Minutes Intravenous Every 8 hours 08/04/17 1218 08/04/17 1448   08/04/17 2200  ceFEPIme (MAXIPIME) 1 g in sodium chloride 0.9 % 100 mL IVPB     1 g 200 mL/hr over 30 Minutes Intravenous Every 8 hours 08/04/17 1448 08/09/17 2159   08/03/17 0100  piperacillin-tazobactam (ZOSYN) IVPB 3.375 g  Status:  Discontinued     3.375 g 12.5 mL/hr over 240 Minutes Intravenous Every  8 hours 08/02/17 1701 08/02/17 1704   08/02/17 1730  piperacillin-tazobactam (ZOSYN) IVPB 3.375 g  Status:  Discontinued     3.375 g 12.5 mL/hr over 240 Minutes Intravenous Every 8 hours 08/02/17 1704 08/04/17 1217   08/02/17 1645  piperacillin-tazobactam (ZOSYN) IVPB 3.375 g  Status:  Discontinued     3.375 g 12.5 mL/hr over 240 Minutes Intravenous Every 8 hours 08/02/17 1639 08/02/17 1701   07/14/17 2200  azithromycin (ZITHROMAX) 500 mg in sodium chloride 0.9 % 250 mL IVPB  Status:  Discontinued     500 mg 250 mL/hr over 60 Minutes Intravenous Every 24 hours 07/14/17 2122 07/18/17 0915      Medications   Scheduled Meds: . chlorhexidine  15 mL Mouth Rinse BID  . sennosides  5 mL Per Tube BID   And  . docusate  50 mg Per Tube BID  . free water  200 mL Per Tube Q6H  . insulin aspart  0-9 Units Subcutaneous Q6H  . ipratropium-albuterol  3 mL Nebulization TID  . liver oil-zinc oxide   Topical Daily  . mouth rinse  15 mL Mouth Rinse QID  . mirtazapine  15 mg Oral QHS  . multivitamin  15 mL Per Tube Daily  . pantoprazole sodium  40 mg Per Tube BID  . sodium chloride flush  10-40 mL Intracatheter Q12H  . thiamine  100 mg Per Tube Daily  . thyroid  30 mg Per Tube QAC breakfast   Continuous Infusions: . ceFEPime (MAXIPIME) IV Stopped (08/06/17 0545)  . dextrose 25 mL/hr at 08/06/17 0800  . feeding supplement (VITAL HIGH PROTEIN) 50 mL/hr at 08/06/17 0800   PRN Meds:.acetaminophen (TYLENOL) oral liquid 160 mg/5 mL, acetaminophen **OR** acetaminophen, ALPRAZolam, bisacodyl, metoprolol tartrate, morphine injection, [DISCONTINUED] ondansetron **OR** ondansetron (ZOFRAN) IV, simethicone, sodium chloride flush   Data Review:   Micro Results Recent Results (from the past 240 hour(s))  Culture, respiratory (NON-Expectorated)     Status: None   Collection Time: 08/02/17  6:23 PM  Result Value Ref Range Status   Specimen Description   Final    TRACHEAL ASPIRATE Performed at [facility], [address] [place], [place] [address]    Special Requests   Final    NONE Performed at [facility], [address] [place], [place] [address]    Gram Stain   Final    ABUNDANT WBC PRESENT, PREDOMINANTLY PMN FEW GRAM POSITIVE RODS    Culture   Final    Consistent with normal respiratory flora. Performed at [facility], [place] [address], [place], [place] [address]    Report Status 08/05/2017 FINAL  Final    Radiology Reports Dg Abd 1 View  Result Date: 07/31/2017 CLINICAL DATA:  Nasogastric tube placement. EXAM: ABDOMEN - 1 VIEW COMPARISON:  Radiograph July 22, 2017. FINDINGS: The bowel gas pattern is normal. Distal tip of feeding tube is seen in distal stomach. No radio-opaque calculi or other significant radiographic abnormality are seen. IMPRESSION: Distal tip of nasogastric tube seen in distal stomach. No evidence of bowel obstruction or ileus. Electronically Signed   By: Marijo Conception, M.D.   On: 07/31/2017 11:47   Dg Abd 1 View  Result Date: 07/22/2017 CLINICAL DATA:  Encounter for feeding tube placement. EXAM: ABDOMEN - 1 VIEW COMPARISON:  Chest radiograph 07/22/2017 FINDINGS: A nasogastric tube has been advanced into the abdomen. Catheter tip is in the distal stomach region. Bowel gas throughout the abdomen. Persistent densities at the left lung base and the left hemidiaphragm remains obscured. IMPRESSION: Feeding tube tip in the distal stomach region. Electronically Signed   By: Markus Daft M.D.   On: 07/22/2017 16:30   Ct Head Wo Contrast  Result Date: 07/23/2017 CLINICAL DATA:  62 y/o F; changes to left pupil in strength of left arm. EXAM: CT HEAD WITHOUT CONTRAST TECHNIQUE: Contiguous axial images were obtained from the base of the skull through the vertex without intravenous contrast. COMPARISON:  None. FINDINGS: Brain: No evidence of acute infarction, hemorrhage, hydrocephalus, extra-axial collection or mass lesion/mass effect. Vascular:  Calcific atherosclerosis of carotid siphons. No hyperdense vessel. Skull: Normal. Negative for fracture or focal lesion. Sinuses/Orbits: No acute finding. Other: None. IMPRESSION: Negative CT of the head. Electronically Signed   By: Kristine Garbe M.D.   On: 07/23/2017 14:33   Nm Gi Blood Loss  Result Date: 07/20/2017 CLINICAL DATA:  GI bleeding EXAM: NUCLEAR MEDICINE GASTROINTESTINAL BLEEDING SCAN TECHNIQUE: Sequential abdominal images were obtained following intravenous administration of Tc-68m labeled red blood cells. RADIOPHARMACEUTICALS:  Twenty mCi Tc-66m pertechnetate in-vitro labeled red cells. COMPARISON:  None. FINDINGS: Normal distribution of radiotracer is noted. No focal area of tracer accumulation is identified to suggest active GI bleeding is noted. IMPRESSION: No findings to suggest active GI hemorrhage. Electronically Signed   By: Inez Catalina M.D.   On: 07/20/2017 15:16   US Venous Img Upper Bilat  Result Date: 07/16/2017 CLINICAL DATA:  Bilateral upper extremity edema acutely with pain and discomfort for 1 day EXAM: BILATERAL UPPER EXTREMITY VENOUS DOPPLER ULTRASOUND TECHNIQUE: Gray-scale sonography with graded compression, as well as color Doppler and duplex ultrasound were performed to evaluate the bilateral upper extremity deep venous systems from the level of the subclavian vein and including the jugular, axillary, basilic, radial, ulnar and upper cephalic vein. Spectral Doppler was utilized to evaluate flow at rest and with distal augmentation maneuvers. COMPARISON:  None. FINDINGS: Exam is limited because of upper extremity subcutaneous edema. RIGHT UPPER EXTREMITY Internal Jugular Vein: No evidence of thrombus. Normal compressibility, respiratory phasicity and response to augmentation. Subclavian Vein: No evidence of thrombus. Normal compressibility, respiratory  phasicity and response to augmentation. Axillary Vein: No evidence of thrombus. Normal compressibility,  respiratory phasicity and response to augmentation. Cephalic Vein: No evidence of thrombus. Normal compressibility, respiratory phasicity and response to augmentation. Basilic Vein: Not well visualized to accurately assess. Brachial Veins: No evidence of thrombus. Normal compressibility, respiratory phasicity and response to augmentation. Radial Veins: No evidence of thrombus. Normal compressibility, respiratory phasicity and response to augmentation. Ulnar Veins: No evidence of thrombus. Normal compressibility, respiratory phasicity and response to augmentation. Venous Reflux:  None. Other Findings:  Peripheral edema noted LEFT UPPER EXTREMITY Internal Jugular Vein: No evidence of thrombus. Normal compressibility, respiratory phasicity and response to augmentation. Subclavian Vein: No evidence of thrombus. Normal compressibility, respiratory phasicity and response to augmentation. Axillary Vein: No evidence of thrombus. Normal compressibility, respiratory phasicity and response to augmentation. Cephalic Vein: No evidence of thrombus. Normal compressibility, respiratory phasicity and response to augmentation. Basilic Vein: No evidence of thrombus. Normal compressibility, respiratory phasicity and response to augmentation. Brachial Veins: No evidence of thrombus. Normal compressibility, respiratory phasicity and response to augmentation. Radial Veins: No evidence of thrombus. Normal compressibility, respiratory phasicity and response to augmentation. Ulnar Veins: No evidence of thrombus. Normal compressibility, respiratory phasicity and response to augmentation. Venous Reflux:  None. Other Findings:  But peripheral subcutaneous edema noted. IMPRESSION: Limited exam but no gross occlusive upper extremity DVT in either arm. Electronically Signed   By: Jerilynn Mages.  Shick M.D.   On: 07/16/2017 12:34   US Venous Img Upper Uni Left  Result Date: 07/25/2017 CLINICAL DATA:  Left upper extremity edema. EXAM: LEFT UPPER EXTREMITY  VENOUS DOPPLER ULTRASOUND TECHNIQUE: Gray-scale sonography with graded compression, as well as color Doppler and duplex ultrasound were performed to evaluate the upper extremity deep venous system from the level of the subclavian vein and including the jugular, axillary, basilic, radial, ulnar and upper cephalic vein. Spectral Doppler was utilized to evaluate flow at rest and with distal augmentation maneuvers. COMPARISON:  None. FINDINGS: Contralateral Subclavian Vein: Respiratory phasicity is normal and symmetric with the symptomatic side. No evidence of thrombus. Normal compressibility. Internal Jugular Vein: No evidence of thrombus. Normal compressibility, respiratory phasicity and response to augmentation. Subclavian Vein: No evidence of thrombus. Normal compressibility, respiratory phasicity and response to augmentation. Axillary Vein: No evidence of thrombus. Normal compressibility, respiratory phasicity and response to augmentation. Cephalic Vein: No evidence of thrombus. Normal compressibility, respiratory phasicity and response to augmentation. Basilic Vein: No evidence of thrombus. Normal compressibility, respiratory phasicity and response to augmentation. Brachial Veins: No evidence of thrombus. Normal compressibility, respiratory phasicity and response to augmentation. Radial Veins: No evidence of thrombus. Normal compressibility, respiratory phasicity and response to augmentation. Ulnar Veins: No evidence of thrombus. Normal compressibility, respiratory phasicity and response to augmentation. Venous Reflux:  None visualized. Other Findings: There is significant edema noted in the left upper arm including some more locally marginated fluid in the subcutaneous tissues of the left medial upper arm. This fluid appears to extend over a long distance and likely represents subcutaneous fluid which is more prominent in 1 area of the upper arm. If there is any concern for soft tissue infection, additional  imaging may be warranted. IMPRESSION: 1. No evidence of left upper extremity deep venous thrombosis. 2. Significant subcutaneous fluid in the left upper arm with an area of more marginated subcutaneous fluid also identified in the medial left upper arm. Although this does not have the appearance of a focal abscess by ultrasound, if there is concern for upper extremity infection, additional  imaging may be warranted with CT and/or MRI. Electronically Signed   By: Aletta Edouard M.D.   On: 07/25/2017 17:48   Dg Chest Port 1 View  Result Date: 08/06/2017 CLINICAL DATA:  Chronic ventilator dependent respiratory failure. Follow-up RIGHT lower lobe atelectasis and/or pneumonia. EXAM: PORTABLE CHEST 1 VIEW COMPARISON:  08/05/2017, 08/04/2017 and earlier. FINDINGS: Tracheostomy tube tip in satisfactory position projecting below the thoracic inlet. RIGHT arm PICC tip projects over the LOWER SVC. Nasogastric tube courses below the diaphragm into the stomach. Cardiac silhouette upper normal in size for AP portable technique, unchanged. Dense airspace consolidation in the RIGHT lower lobe, unchanged, possibly associated with a RIGHT pleural effusion. LEFT lung clear, without evidence of recurrent LEFT lower lobe atelectasis as noted on the examination 2 days ago. No new pulmonary parenchymal abnormalities in either lung. IMPRESSION: 1.  Support apparatus satisfactory. 2. Stable dense RIGHT lower lobe atelectasis and/or pneumonia and possible associated RIGHT pleural effusion. 3. No new abnormalities. Electronically Signed   By: Evangeline Dakin M.D.   On: 08/06/2017 08:43   Dg Chest Port 1 View  Result Date: 08/05/2017 CLINICAL DATA:  Acute respiratory failure EXAM: PORTABLE CHEST 1 VIEW COMPARISON:  08/04/2017 FINDINGS: Tracheostomy in good position. Right arm PICC tip in the mid SVC. NG tube in the stomach. Progression of right lower lobe atelectasis/infiltrate. Mild left lower lobe atelectasis slightly improved. Small  right effusion. Negative for edema. IMPRESSION: Mild progression of right lower lobe airspace disease. Mild improvement in left lower lobe atelectasis. Electronically Signed   By: Franchot Gallo M.D.   On: 08/05/2017 07:33   Dg Chest Port 1 View  Result Date: 08/04/2017 CLINICAL DATA:  Pneumonia EXAM: PORTABLE CHEST 1 VIEW COMPARISON:  08/03/2017, 08/02/2017, 07/30/2017 FINDINGS: Tracheostomy tube is in place. Worsening airspace disease at the right lung base. No change in dense left lung base consolidation. Probable small effusions. Stable enlarged cardiomediastinal silhouette. IMPRESSION: 1. Worsening airspace disease at the right lung base may reflect worsening atelectasis or pneumonia 2. No change in dense left lower lobe atelectasis or pneumonia. Probable small pleural effusions. Electronically Signed   By: Donavan Foil M.D.   On: 08/04/2017 03:58   Dg Chest Port 1 View  Result Date: 08/03/2017 CLINICAL DATA:  Pneumonia EXAM: PORTABLE CHEST 1 VIEW COMPARISON:  08/02/2017 FINDINGS: Tracheostomy in good position. Right arm PICC tip in the SVC. NG tube in the stomach. Bibasilar airspace disease unchanged. Small left effusion. Negative for edema. IMPRESSION: Bibasilar atelectasis/infiltrate unchanged. Support lines remain in good position. Electronically Signed   By: Franchot Gallo M.D.   On: 08/03/2017 07:14   Dg Chest Port 1 View  Result Date: 08/02/2017 CLINICAL DATA:  Shortness of Breath EXAM: PORTABLE CHEST 1 VIEW COMPARISON:  August 01, 2017 FINDINGS: Tracheostomy catheter tip is 5.0 cm above the carina. Central catheter tip is in the superior vena cava. Nasogastric tube tip and side port are below the diaphragm. No pneumothorax. There is airspace consolidation left lower lobe with small left pleural effusion. There is mild right base atelectasis. There is cardiomegaly with pulmonary vascularity within normal limits. No adenopathy. No bone lesions. IMPRESSION: Tube and catheter positions as described  without evident pneumothorax. Airspace consolidation consistent with pneumonia left lower lobe with small left pleural effusion. Mild right base atelectasis. Stable cardiac prominence. Electronically Signed   By: Lowella Grip III M.D.   On: 08/02/2017 07:55   Dg Chest Port 1 View  Result Date: 08/01/2017 CLINICAL DATA:  Pneumonia EXAM:  PORTABLE CHEST 1 VIEW COMPARISON:  07/30/2017, 07/29/2017, 07/27/2017, 07/23/2017 FINDINGS: Tracheostomy tube remains in place. Esophageal tube tip is below the diaphragm but non included. Interval opacification of the right diaphragm. No change in dense left lung base consolidation. Probable tiny pleural effusions. Enlarged cardiomediastinal silhouette is unchanged. Right upper extremity catheter tip overlies the SVC. IMPRESSION: 1. Increased opacity at the right base may reflect atelectasis 2. No significant change in probable small left effusion and dense left lung base consolidation, atelectasis versus pneumonia. 3. Stable cardiomegaly Electronically Signed   By: Donavan Foil M.D.   On: 08/01/2017 02:56   Dg Chest Port 1 View  Result Date: 07/30/2017 CLINICAL DATA:  Pneumonia EXAM: PORTABLE CHEST 1 VIEW COMPARISON:  07/29/2017 and prior radiographs FINDINGS: A tracheostomy tube, RIGHT PICC line with tip overlying the mid-LOWER SVC and endotracheal tube with tip overlying the distal esophagus again noted. Decreased LEFT LOWER lung consolidation/atelectasis noted. Mild RIGHT basilar atelectasis again noted. There is no evidence of pneumothorax. No other interval changes identified. IMPRESSION: 1. Decreased LEFT LOWER lung consolidation/atelectasis. 2. NG tube with tip overlying the distal esophagus-recommend advancement. Electronically Signed   By: Margarette Canada M.D.   On: 07/30/2017 07:49   Dg Chest Port 1 View  Result Date: 07/29/2017 CLINICAL DATA:  Acute on chronic respiratory failure. EXAM: PORTABLE CHEST 1 VIEW COMPARISON:  Radiograph July 27, 2017. FINDINGS:  Stable cardiomegaly. Tracheostomy is in grossly good position. Distal tip of nasogastric tube is seen in distal esophagus. No pneumothorax is noted. Right-sided PICC line is noted with distal tip in expected position of SVC. Mild bibasilar subsegmental atelectasis is noted. Bony thorax is unremarkable. IMPRESSION: Mild bibasilar subsegmental atelectasis. Endotracheal tube in grossly good position. Distal tip of nasogastric tube has withdrawn into distal esophagus. Electronically Signed   By: Marijo Conception, M.D.   On: 07/29/2017 22:01   Dg Chest Port 1 View  Result Date: 07/27/2017 CLINICAL DATA:  Excessive fluid EXAM: PORTABLE CHEST 1 VIEW COMPARISON:  Chest x-ray of July 23, 2017 FINDINGS: The lungs are well-expanded. There is a trace of pleural fluid on the right and slightly more on the left. The retrocardiac region remains dense. The cardiac silhouette is enlarged. The central pulmonary vascularity is prominent. The mediastinum is normal in width. The trachea is midline. The right-sided PICC line tip projects over the midportion of the SVC. The tracheostomy tube tip projects at the superior margin of the clavicular heads. The bony thorax exhibits no acute abnormality. IMPRESSION: Slight improved aeration of both lungs today. Small amounts of pleural fluid at both bases. Persistent atelectasis or pneumonia in the left lower lobe. Top-normal cardiac size with central pulmonary vascular congestion but no definite pulmonary edema. Electronically Signed   By: David  Martinique M.D.   On: 07/27/2017 10:55   Dg Chest Port 1 View  Result Date: 07/23/2017 CLINICAL DATA:  Acute on chronic respiratory failure. Airway aspiration. EXAM: PORTABLE CHEST 1 VIEW COMPARISON:  07/22/2017. FINDINGS: Interval enlargement of the cardiac silhouette. Stable dense left lower lobe airspace opacity. Interval increased linear and patchy density at the right lung base. Stable small bilateral pleural effusions. Nasogastric tube  extending into the stomach. Thoracic spine degenerative changes. IMPRESSION: 1. No significant change in dense left lower lobe atelectasis or pneumonia and small left pleural effusion. 2. Increased right basilar atelectasis or pneumonia with a stable small right pleural effusion. Electronically Signed   By: Claudie Revering M.D.   On: 07/23/2017 16:09   Dg Chest  Port 1 View  Result Date: 07/22/2017 CLINICAL DATA:  Respiratory failure.  COPD. EXAM: PORTABLE CHEST 1 VIEW COMPARISON:  07/19/2017. FINDINGS: The cardiac silhouette remains borderline enlarged. Stable left pleural effusion and left basilar airspace opacity. Small right pleural effusion, decreased, with decreased adjacent right basilar atelectasis. Thoracic spine degenerative changes. IMPRESSION: 1. Stable left pleural effusion and left lower lobe atelectasis or pneumonia. 2. Decreased right pleural fluid and right basilar atelectasis. Electronically Signed   By: Claudie Revering M.D.   On: 07/22/2017 09:14   Dg Chest Port 1 View  Result Date: 07/19/2017 CLINICAL DATA:  Respiratory failure EXAM: PORTABLE CHEST 1 VIEW COMPARISON:  July 15, 2017 FINDINGS: There are small pleural effusions bilaterally. There is consolidation in the left lower lobe. Lungs elsewhere are clear. Heart size is upper normal with pulmonary vascularity within normal limits. No adenopathy. There is degenerative change in the thoracic spine. IMPRESSION: Left lower lobe consolidation with small pleural effusions bilaterally. There is mild bibasilar atelectasis. Stable cardiac silhouette. Electronically Signed   By: Lowella Grip III M.D.   On: 07/19/2017 07:57   Dg Chest Port 1 View  Result Date: 07/15/2017 CLINICAL DATA:  Acute respiratory failure. EXAM: PORTABLE CHEST 1 VIEW COMPARISON:  Chest x-ray from yesterday FINDINGS: The heart size and mediastinal contours are within normal limits. Normal pulmonary vascularity. Unchanged left greater than right bibasilar atelectasis and  scarring at the costophrenic angles. No focal consolidation, pleural effusion, or pneumothorax. No acute osseous abnormality. IMPRESSION: Stable bibasilar atelectasis/scarring. Electronically Signed   By: Titus Dubin M.D.   On: 07/15/2017 10:41   Dg Chest Portable 1 View  Result Date: 07/14/2017 CLINICAL DATA:  Shortness of breath. EXAM: PORTABLE CHEST 1 VIEW COMPARISON:  06/05/2017. FINDINGS: Mediastinum and hilar structures normal. Stable mild cardiomegaly with normal pulmonary vascularity. Low lung volumes with mild basilar atelectasis. Stable bilateral pleural thickening consistent scarring. Degenerative changes thoracic spine. IMPRESSION: Low lung volumes with mild basilar atelectasis and/or pleuroparenchymal scarring again noted. Stable mild cardiomegaly. Chest is stable from prior exam. Electronically Signed   By: Marcello Moores  Register   On: 07/14/2017 17:13   Dg Abd Portable 1v  Result Date: 08/04/2017 CLINICAL DATA:  Nasogastric placement. EXAM: PORTABLE ABDOMEN - 1 VIEW COMPARISON:  07/31/2017 FINDINGS: Nasogastric tube enters the stomach, curves along the greater curvature and has its tip in the antrum. Moderate amount of intestinal gas is noted without obstructive pattern. IMPRESSION: Nasogastric tube tip in the gastric antrum. Electronically Signed   By: Nelson Chimes M.D.   On: 08/04/2017 19:23   [place] Ekg Site Rite  Result Date: 07/25/2017 If Site Rite image not attached, placement could not be confirmed due to current cardiac rhythm.    CBC Recent Labs  Lab 08/01/17 0436 08/01/17 2342 08/02/17 0507 08/03/17 0539 08/04/17 0559 08/05/17 0754  WBC 9.3  --  13.4* 10.0 7.9 5.7  HGB 7.1* 9.5* 9.2* 9.0* 8.2* 9.0*  HCT 22.7*  --  28.7* 27.9* 26.1* 28.6*  PLT 346  --  322 268 257 270  MCV 92.2  --  90.9 91.7 91.4 91.6  MCH 28.9  --  29.2 29.7 28.8 28.9  MCHC 31.3*  --  32.2 32.4 31.5* 31.5*  RDW 16.8*  --  16.2* 15.7* 16.0* 15.7*  LYMPHSABS 0.7*  --  0.7* 0.6* 0.9*  --   MONOABS 0.5   --  0.7 0.6 0.6  --   EOSABS 0.2  --  0.2 0.1 0.2  --   BASOSABS  0.0  --  0.0 0.0 0.0  --     Chemistries  Recent Labs  Lab 08/01/17 0436 08/02/17 0507 08/03/17 0539 08/04/17 0559 08/05/17 0754 08/06/17 0515  NA 139 139 139 141 136 138  K 3.8 3.7 3.7 3.4* 4.3 3.6  CL 100* 99* 99* 99* 99* 97*  CO2 34* 32 33* 35* 30 33*  GLUCOSE 120* 113* 118* 87 99 99  BUN 16 16 20 17 16 18   CREATININE <0.30* <0.30* 0.47 <0.30* <0.30* <0.30*  CALCIUM 8.9 9.1 9.0 9.0 9.1 9.4  MG 1.7 1.5* 1.9 1.7 1.8  --    ------------------------------------------------------------------------------------------------------------------ CrCl cannot be calculated (This lab value cannot be used to calculate CrCl because it is not a number: <0.30). ------------------------------------------------------------------------------------------------------------------ No results for input(s): HGBA1C in the last 72 hours. ------------------------------------------------------------------------------------------------------------------ No results for input(s): CHOL, HDL, LDLCALC, TRIG, CHOLHDL, LDLDIRECT in the last 72 hours. ------------------------------------------------------------------------------------------------------------------ No results for input(s): TSH, T4TOTAL, T3FREE, THYROIDAB in the last 72 hours.  Invalid input(s): FREET3 ------------------------------------------------------------------------------------------------------------------ No results for input(s): VITAMINB12, FOLATE, FERRITIN, TIBC, IRON, RETICCTPCT in the last 72 hours.  Coagulation profile No results for input(s): INR, PROTIME in the last 168 hours.  No results for input(s): DDIMER in the last 72 hours.  Cardiac Enzymes No results for input(s): CKMB, TROPONINI, MYOGLOBIN in the last 168 hours.  Invalid input(s):  CK ------------------------------------------------------------------------------------------------------------------ Invalid input(s): POCBNP    Assessment & Plan   62 year old female patient with history of chronic respiratory failure, GI bleed currently in the hospital for acute on chronic hypercapnic respiratory failure and anemia secondary to slow GI bleed.  1.Acute on chronic hypercapnic respiratory failure Has tracheostomy on ventilator support,  weaning trials as tolerated as per ICU physician and respiratory therapist protocol . On IV Zosyn antibiotic empirically  2.  Moderate malnutrition   Continue NG tube feeding, appreciate GI following.,waiting for swallow eval  3. Electrolyte imbalance F/u magnesium and potassium  4.  Acute on chronic gastrointestinal bleeding s/p EGD 3/18 showing Normal duodenal bulb and second portion of the duodenum.Duodenal lipoma. Non-bleeding gastric ulcer with a clean ulcer base ([place] Class III).Normal gastroesophageal junction and esophagus. - protonix 40 mg IV bid - 5. DVT prophylaxis with SCD  6.  Constipation, patient started on stool softeners, patient had a big BM yesterday.  7. euthyroid sick syndrome.:  Continue Synthyroid.  8.  Hypokalemia resolved  9. Diarhhea : Hold stool softeners        Code Status Orders  (From admission, onward)        Start     Ordered   07/14/17 2115  Full code  Continuous     07/14/17 2122    Code Status History    Date Active Date Inactive Code Status Order ID Comments User Context   06/05/2017 1654 06/08/2017 1820 Full Code [id number]  Bettey Costa, MD Inpatient   11/01/2016 0121 11/09/2016 2044 Full Code [id number]  Lance Coon, MD Inpatient      Time Spent in minutes   35  Greater than 50% of time spent in care coordination and counseling patient regarding the condition and plan of care.   Saundra Shelling M.D on 08/06/2017 at 10:21 AM  Between 7am to 6pm - Pager -  [phone]  After 6pm go to [email or web address] - Proofreader  [employer]   Office  [phone]

## 2017-08-06 NOTE — Progress Notes (Signed)
Pharmacy Electrolyte Monitoring Consult:  Pharmacy consulted to assist in monitoring and replacing electrolytes in this 62 y.o. female admitted on 07/14/2017 with Weakness   Labs:  Sodium (mmol/L)  Date Value  08/06/2017 138   Potassium (mmol/L)  Date Value  08/06/2017 3.6   Magnesium (mg/dL)  Date Value  08/05/2017 1.8   Phosphorus (mg/dL)  Date Value  08/05/2017 3.3   Calcium (mg/dL)  Date Value  08/06/2017 9.4   Albumin (g/dL)  Date Value  07/26/2017 2.5 (L)    Plan: Magnesium is trending down, will order magnesium 400mg  VT Q12hr x 4 doses.  Will order additional potassium 45mEq VT x 1.   4/6:  K 4.3,  Mag 1.8, Phos 3.3 Patient on Mag-Ox 400 mg po bid x 4 doses started 08/04/17. No supplementation at this time Will recheck electrolytes with am labs.   4/7: K 3.6.  Patient received lasix x 1 last evening. No supplementation at this time Will recheck electrolytes with am labs.   Pharmacy will continue to monitor and adjust per consult.   Edrik Rundle A 08/06/2017 10:57 AM

## 2017-08-06 NOTE — Progress Notes (Signed)
Pharmacy Antibiotic Note  Kayla Boyer is a 62 y.o. female admitted on 07/14/2017 with pneumonia.  Pharmacy has been consulted for cefepime dosing to cover possible HCAP. Patient transitioned from Zosyn to cefepime on 4/5; with total duration of antibiotics for 7 days. Procalcitonin 0.17.   Plan: Day 3- Continue cefepime 1g IV Q8hr for total of 7 days of therapy.   Height: 5' 2.5" (158.8 cm) Weight: 126 lb 12.8 oz (57.5 kg) IBW/kg (Calculated) : 51.25  Temp (24hrs), Avg:98.6 F (37 C), Min:97.7 F (36.5 C), Max:99.5 F (37.5 C)  Recent Labs  Lab 08/01/17 0436 08/02/17 0507 08/03/17 0539 08/04/17 0559 08/05/17 0754 08/06/17 0515  WBC 9.3 13.4* 10.0 7.9 5.7  --   CREATININE <0.30* <0.30* 0.47 <0.30* <0.30* <0.30*    CrCl cannot be calculated (This lab value cannot be used to calculate CrCl because it is not a number: <0.30).    No Known Allergies  Antimicrobials this admission: Zosyn 4/3 >> 4/5 Cefepime 4/5 >>   Dose adjustments this admission: N/A  Microbiology results: 4/3 Sputum: reincubated for better growth.  3/16 MRSA PCR: negative   Thank you for allowing pharmacy to be a part of this patient's care.  Kaesyn Johnston A 08/06/2017 10:56 AM

## 2017-08-07 LAB — GLUCOSE, CAPILLARY
GLUCOSE-CAPILLARY: 106 mg/dL — AB (ref 65–99)
GLUCOSE-CAPILLARY: 106 mg/dL — AB (ref 65–99)
GLUCOSE-CAPILLARY: 114 mg/dL — AB (ref 65–99)
GLUCOSE-CAPILLARY: 123 mg/dL — AB (ref 65–99)

## 2017-08-07 LAB — BASIC METABOLIC PANEL
Anion gap: 6 (ref 5–15)
BUN: 21 mg/dL — AB (ref 6–20)
CO2: 33 mmol/L — ABNORMAL HIGH (ref 22–32)
Calcium: 9.2 mg/dL (ref 8.9–10.3)
Chloride: 100 mmol/L — ABNORMAL LOW (ref 101–111)
Creatinine, Ser: <0.3 mg/dL — ABNORMAL LOW (ref 0.44–1.00)
GLUCOSE: 115 mg/dL — AB (ref 65–99)
POTASSIUM: 4 mmol/L (ref 3.5–5.1)
Sodium: 139 mmol/L (ref 135–145)

## 2017-08-07 LAB — PROCALCITONIN: Procalcitonin: <0.1 ng/mL

## 2017-08-07 LAB — FOLATE: Folate: 13.3 ng/mL (ref >5.9)

## 2017-08-07 LAB — MAGNESIUM: MAGNESIUM: 1.8 mg/dL (ref 1.7–2.4)

## 2017-08-07 LAB — PHOSPHORUS: Phosphorus: 4 mg/dL (ref 2.5–4.6)

## 2017-08-07 MED ORDER — FREE WATER
100.0000 mL | Freq: Four times a day (QID) | Status: DC
  Administered 2017-08-07 – 2017-08-17 (×28): 100 mL

## 2017-08-07 MED ORDER — ACETAMINOPHEN 325 MG PO TABS
ORAL_TABLET | ORAL | Status: AC
  Administered 2017-08-07: 650 mg
  Filled 2017-08-07: qty 2

## 2017-08-07 MED ORDER — FOLIC ACID 1 MG PO TABS
1.0000 mg | ORAL_TABLET | Freq: Every day | ORAL | Status: DC
  Administered 2017-08-07 – 2017-08-09 (×3): 1 mg via ORAL
  Filled 2017-08-07 (×3): qty 1

## 2017-08-07 MED ORDER — MIRTAZAPINE 15 MG PO TBDP
15.0000 mg | ORAL_TABLET | Freq: Every day | ORAL | Status: DC
  Administered 2017-08-07 – 2017-08-16 (×10): 15 mg
  Filled 2017-08-07 (×12): qty 1

## 2017-08-07 MED ORDER — SIMETHICONE 80 MG PO CHEW
80.0000 mg | CHEWABLE_TABLET | Freq: Four times a day (QID) | ORAL | Status: DC | PRN
  Administered 2017-08-07 – 2017-08-15 (×2): 80 mg via ORAL
  Filled 2017-08-07 (×3): qty 1

## 2017-08-07 MED ORDER — SENNOSIDES-DOCUSATE SODIUM 8.6-50 MG PO TABS
2.0000 | ORAL_TABLET | Freq: Two times a day (BID) | ORAL | Status: DC
  Administered 2017-08-10 – 2017-08-16 (×7): 2 via ORAL
  Filled 2017-08-07 (×10): qty 2

## 2017-08-07 MED ORDER — PANTOPRAZOLE SODIUM 40 MG PO PACK: 40.0000 mg | PACK | Freq: Every day | ORAL | Status: DC

## 2017-08-07 MED ORDER — ACETAMINOPHEN 325 MG PO TABS
650.0000 mg | ORAL_TABLET | Freq: Four times a day (QID) | ORAL | Status: DC | PRN
  Administered 2017-08-07 – 2017-08-16 (×10): 650 mg
  Filled 2017-08-07 (×9): qty 2

## 2017-08-07 NOTE — Progress Notes (Deleted)
OT Cancellation Note  Patient Details Name: Kayla Boyer MRN: [id number] DOB: Jul 17, 1955   Cancelled Treatment:    Reason Eval/Treat Not Completed: Other (comment). Pt noted with new cancellation of existing OT order. Will complete current order. Please re-consult OT in the future as appropriate.   Jeni Salles, MPH, MS, OTR/L ascom [phone] 08/07/17, 11:26 AM

## 2017-08-07 NOTE — Progress Notes (Signed)
Cephas Darby, MD [address]  [place]  [place], [place] [address]  Main: [phone]  Fax: [phone] Pager: [phone]   Subjective: Patient is tolerating tube feeds very well, following commands, alert Denies any abdominal pain, nausea and vomiting  Objective: Vital signs in last 24 hours: Vitals:   08/07/17 1200 08/07/17 1259 08/07/17 1600 08/07/17 1700  BP: 105/78  97/77 114/90  Pulse: (!) 114 (!) 117 (!) 113 (!) 111  Resp: 20 (!) 24 18 18   Temp:  98.7 F (37.1 C)  98.8 F (37.1 C)  TempSrc:  Axillary  Axillary  SpO2: 94% (!) 88% 96% 95%  Weight:      Height:       Weight change: 10 lb 5.2 oz (4.684 kg)  Intake/Output Summary (Last 24 hours) at 08/07/2017 1756 Last data filed at 08/07/2017 1424 Gross per 24 hour  Intake 1072.92 ml  Output 975 ml  Net 97.92 ml     Exam: Heart:: Regular rate and rhythm Lungs: clear to auscultation Abdomen: soft, nontender, normal bowel sounds   Lab Results: @LABTEST2 @ Micro Results: Recent Results (from the past 240 hour(s))  Culture, respiratory (NON-Expectorated)     Status: None   Collection Time: 08/02/17  6:23 PM  Result Value Ref Range Status   Specimen Description   Final    TRACHEAL ASPIRATE Performed at [facility], [address] [place], [place] [address]    Special Requests   Final    NONE Performed at [facility], [address] [place], [place] [address]    Gram Stain   Final    ABUNDANT WBC PRESENT, PREDOMINANTLY PMN FEW GRAM POSITIVE RODS    Culture   Final    Consistent with normal respiratory flora. Performed at [facility], [place] [address], [place], [place] [address]    Report Status 08/05/2017 FINAL  Final  Culture, respiratory (NON-Expectorated)     Status: None (Preliminary result)   Collection Time: 08/06/17  9:32 PM  Result Value Ref Range Status   Specimen Description   Final    INDUCED SPUTUM Performed at [facility], [address], [place], [place] [address]    Special Requests   Final    NONE Performed at [facility], [address] [place], [place] [address]    Gram Stain   Final    RARE WBC PRESENT, PREDOMINANTLY PMN NO ORGANISMS SEEN Performed at [facility], [place] [address], [place], [place] [address]    Culture PENDING  Incomplete   Report Status PENDING  Incomplete   Studies/Results: Dg Chest Port 1 View  Result Date: 08/06/2017 CLINICAL DATA:  Chronic ventilator dependent respiratory failure. Follow-up RIGHT lower lobe atelectasis and/or pneumonia. EXAM: PORTABLE CHEST 1 VIEW COMPARISON:  08/05/2017, 08/04/2017 and earlier. FINDINGS: Tracheostomy tube tip in satisfactory position projecting below the thoracic inlet. RIGHT arm PICC tip projects over the LOWER SVC. Nasogastric tube courses below the diaphragm into the stomach. Cardiac silhouette upper normal in size for AP portable technique, unchanged. Dense airspace consolidation in the RIGHT lower lobe, unchanged, possibly associated with a RIGHT pleural effusion. LEFT lung clear, without evidence of recurrent LEFT lower lobe atelectasis as noted on the examination 2 days ago. No new pulmonary parenchymal abnormalities in either lung. IMPRESSION: 1.  Support apparatus satisfactory. 2. Stable dense RIGHT lower lobe atelectasis and/or pneumonia and possible associated RIGHT pleural effusion. 3. No new abnormalities. Electronically Signed  By: Evangeline Dakin M.D.   On: 08/06/2017 08:43   Medications: I have reviewed the patient's current medications. Scheduled Meds: . chlorhexidine  15 mL Mouth Rinse BID  . free water  100 mL Per Tube Q6H  . ipratropium-albuterol  3 mL Nebulization TID  . liver oil-zinc oxide   Topical Daily  . mouth rinse  15 mL Mouth Rinse QID  . mirtazapine  15 mg Per Tube QHS  . multivitamin  15 mL Per Tube Daily  . [START ON 08/08/2017] pantoprazole sodium  40 mg Per Tube Daily  . senna-docusate  2 tablet  Oral BID  . sodium chloride flush  10-40 mL Intracatheter Q12H  . thyroid  30 mg Per Tube QAC breakfast   Continuous Infusions: . feeding supplement (VITAL HIGH PROTEIN) 1,000 mL (08/07/17 1659)   PRN Meds:.acetaminophen, ALPRAZolam, bisacodyl, metoprolol tartrate, [DISCONTINUED] ondansetron **OR** ondansetron (ZOFRAN) IV, simethicone, sodium chloride flush   Assessment: Principal Problem:   Major depressive disorder, single episode, severe without psychotic features ([place]) Active Problems:   GI bleed   Chronic gastric ulcer without hemorrhage and without perforation   Acute respiratory failure (HCC)   Goals of care, counseling/discussion   Palliative care encounter   Acute on chronic respiratory failure with hypercapnia ([employer])   Protein-calorie malnutrition ([place])   At high risk for aspiration   Palliative care by specialist   Dysphagia    Plan: Recommend swallowing evaluation, consult placed. PEG tube placement pending results from swallow study Continue tube feeds Has folate deficiency, recommend folic acid 1 mg daily Check prealbumin levels   LOS: 24 days   Arelyn Gauer 08/07/2017, 5:56 PM

## 2017-08-07 NOTE — Progress Notes (Signed)
Pharmacy Electrolyte Monitoring Consult:  Pharmacy consulted to assist in monitoring and replacing electrolytes in this 62 y.o. female admitted on 07/14/2017 with Weakness   Labs:  Sodium (mmol/L)  Date Value  08/07/2017 139   Potassium (mmol/L)  Date Value  08/07/2017 4.0   Magnesium (mg/dL)  Date Value  08/07/2017 1.8   Phosphorus (mg/dL)  Date Value  08/07/2017 4.0   Calcium (mg/dL)  Date Value  08/07/2017 9.2   Albumin (g/dL)  Date Value  07/26/2017 2.5 (L)    Plan: No replacement warranted at this time. Will plan to recheck electrolytes on 4/10.   Pharmacy will continue to monitor and adjust per consult.   Simpson,Michael L 08/07/2017 9:21 AM

## 2017-08-07 NOTE — Progress Notes (Signed)
[place] at [facility]                                                                                                                                                                                  Patient Demographics   Kayla Boyer, is a 62 y.o. female, DOB - [birth year], IRC:[id number]  Admit date - 07/14/2017   Admitting Physician Saundra Shelling, MD  Outpatient Primary MD for the patient is Patient, [name]   LOS - 24  Subjective: Patient seen and evaluated today On tracheostomy with vent support Communicates by writing on the pad and on verbal commands No complaints of pain Has a NG tube feeding Diarrhea improved Status post physical therapy evaluation today   Review of Systems:   CONSTITUTIONAL: No documented fever. No fatigue, weakness. No weight gain, no weight loss.  EYES: No blurry or double vision.  ENT: No tinnitus. No postnasal drip. No redness of the oropharynx.  Has tracheostomy RESPIRATORY: No cough, no wheeze, no hemoptysis. No dyspnea.  CARDIOVASCULAR: No chest pain. No orthopnea. No palpitations. No syncope.  GASTROINTESTINAL: No nausea, no vomiting or diarrhea. No abdominal pain. No melena or hematochezia. GENITOURINARY: No dysuria or hematuria.  ENDOCRINE: No polyuria or nocturia. No heat or cold intolerance.  HEMATOLOGY: No anemia. No bruising. No bleeding.  INTEGUMENTARY: No rashes. No lesions.  MUSCULOSKELETAL: No arthritis. No swelling. No gout.  NEUROLOGIC: No numbness, tingling, or ataxia. No seizure-type activity.  PSYCHIATRIC: No anxiety. No insomnia. No ADD.    Vitals:   Vitals:   08/07/17 1000 08/07/17 1100 08/07/17 1200 08/07/17 1259  BP:  110/84 105/78   Pulse: (!) 107  (!) 114 (!) 117  Resp:  (!) 25 20 (!) 24  Temp:    98.7 F (37.1 C)  TempSrc:    Axillary  SpO2: (!) 88%  94% (!) 88%  Weight:      Height:        Wt Readings from Last 3 Encounters:  08/07/17 62.2 kg (137 lb 2 oz)  07/05/17 60.8 kg  (134 lb)  07/04/17 60.8 kg (134 lb)     Intake/Output Summary (Last 24 hours) at 08/07/2017 1317 Last data filed at 08/07/2017 1202 Gross per 24 hour  Intake 1295.42 ml  Output 1235 ml  Net 60.42 ml    Physical Exam:   GENERAL: Pleasant-appearing female patient lying on the bed with ventilator support via tracheostomy HEAD, EYES, EARS, NOSE AND THROAT: Atraumatic, normocephalic. Extraocular muscles are intact. Pupils equal and reactive to light. Sclerae anicteric. No conjunctival injection. No oro-pharyngeal erythema.  NECK: Supple. There is no jugular venous distention. No bruits, no lymphadenopathy, no  thyromegaly.  Has tracheostomy, Has NGT for feeds HEART: Regular rate and rhythm,. No murmurs, no rubs, no clicks.  LUNGS: Clear to auscultation bilaterally. No rales or rhonchi. No wheezes.  Has tracheostomy with ventilator support Tidal volume 450 rate 15 PEEP 5 FiO2 30% ABDOMEN: Soft, flat, nontender, nondistended. Has good bowel sounds. No hepatosplenomegaly appreciated.  EXTREMITIES: No evidence of any cyanosis, clubbing, or peripheral edema.  +2 pedal and radial pulses bilaterally.  NEUROLOGIC: The patient is alert, awake, and oriented x3 with no focal motor or sensory deficits appreciated bilaterally.  SKIN: Moist and warm with no rashes appreciated.  Psych: Not anxious, depressed LN: No inguinal LN enlargement    Antibiotics   Anti-infectives (From admission, onward)   Start     Dose/Rate Route Frequency Ordered Stop   08/04/17 2200  ceFEPIme (MAXIPIME) 1 g in sodium chloride 0.9 % 100 mL IVPB  Status:  Discontinued     1 g 200 mL/hr over 30 Minutes Intravenous Every 8 hours 08/04/17 1218 08/04/17 1448   08/04/17 2200  ceFEPIme (MAXIPIME) 1 g in sodium chloride 0.9 % 100 mL IVPB  Status:  Discontinued     1 g 200 mL/hr over 30 Minutes Intravenous Every 8 hours 08/04/17 1448 08/07/17 1119   08/03/17 0100  piperacillin-tazobactam (ZOSYN) IVPB 3.375 g  Status:   Discontinued     3.375 g 12.5 mL/hr over 240 Minutes Intravenous Every 8 hours 08/02/17 1701 08/02/17 1704   08/02/17 1730  piperacillin-tazobactam (ZOSYN) IVPB 3.375 g  Status:  Discontinued     3.375 g 12.5 mL/hr over 240 Minutes Intravenous Every 8 hours 08/02/17 1704 08/04/17 1217   08/02/17 1645  piperacillin-tazobactam (ZOSYN) IVPB 3.375 g  Status:  Discontinued     3.375 g 12.5 mL/hr over 240 Minutes Intravenous Every 8 hours 08/02/17 1639 08/02/17 1701   07/14/17 2200  azithromycin (ZITHROMAX) 500 mg in sodium chloride 0.9 % 250 mL IVPB  Status:  Discontinued     500 mg 250 mL/hr over 60 Minutes Intravenous Every 24 hours 07/14/17 2122 07/18/17 0915      Medications   Scheduled Meds: . chlorhexidine  15 mL Mouth Rinse BID  . sennosides  5 mL Per Tube BID   And  . docusate  50 mg Per Tube BID  . free water  100 mL Per Tube Q6H  . ipratropium-albuterol  3 mL Nebulization TID  . liver oil-zinc oxide   Topical Daily  . mouth rinse  15 mL Mouth Rinse QID  . mirtazapine  15 mg Per Tube QHS  . multivitamin  15 mL Per Tube Daily  . [START ON 08/08/2017] pantoprazole sodium  40 mg Per Tube Daily  . sodium chloride flush  10-40 mL Intracatheter Q12H  . thyroid  30 mg Per Tube QAC breakfast   Continuous Infusions: . feeding supplement (VITAL HIGH PROTEIN) 50 mL/hr at 08/07/17 0700   PRN Meds:.acetaminophen (TYLENOL) oral liquid 160 mg/5 mL, ALPRAZolam, bisacodyl, metoprolol tartrate, [DISCONTINUED] ondansetron **OR** ondansetron (ZOFRAN) IV, simethicone, sodium chloride flush   Data Review:   Micro Results Recent Results (from the past 240 hour(s))  Culture, respiratory (NON-Expectorated)     Status: None   Collection Time: 08/02/17  6:23 PM  Result Value Ref Range Status   Specimen Description   Final    TRACHEAL ASPIRATE Performed at [facility], [address] [place], [place] [address]    Special Requests   Final    NONE Performed at [facility], [place], [place] [address]    Gram Stain   Final    ABUNDANT WBC PRESENT, PREDOMINANTLY PMN FEW GRAM POSITIVE RODS    Culture   Final    Consistent with normal respiratory flora. Performed at [facility], [place] [address], [place], [place] [address]    Report Status 08/05/2017 FINAL  Final  Culture, respiratory (NON-Expectorated)     Status: None (Preliminary result)   Collection Time: 08/06/17  9:32 PM  Result Value Ref Range Status   Specimen Description   Final    INDUCED SPUTUM Performed at [facility], [address], [place], [place] [address]    Special Requests   Final    NONE Performed at [facility], [address] [place], [place] [address]    Gram Stain   Final    RARE WBC PRESENT, PREDOMINANTLY PMN NO ORGANISMS SEEN Performed at [facility], [place] [address], [place], [place] [address]    Culture PENDING  Incomplete   Report Status PENDING  Incomplete    Radiology Reports Dg Abd 1 View  Result Date: 07/31/2017 CLINICAL DATA:  Nasogastric tube placement. EXAM: ABDOMEN - 1 VIEW COMPARISON:  Radiograph July 22, 2017. FINDINGS: The bowel gas pattern is normal. Distal tip of feeding tube is seen in distal stomach. No radio-opaque calculi or other significant radiographic abnormality are seen. IMPRESSION: Distal tip of nasogastric tube seen in distal stomach. No evidence of bowel obstruction or ileus. Electronically Signed   By: Marijo Conception, M.D.   On: 07/31/2017 11:47   Dg Abd 1 View  Result Date: 07/22/2017 CLINICAL DATA:  Encounter for feeding tube placement. EXAM: ABDOMEN - 1 VIEW COMPARISON:  Chest radiograph 07/22/2017 FINDINGS: A nasogastric tube has been advanced into the abdomen. Catheter tip is in the distal stomach region. Bowel gas throughout the abdomen. Persistent densities at the left lung base and the left hemidiaphragm remains obscured. IMPRESSION: Feeding tube tip in the distal stomach region.  Electronically Signed   By: Markus Daft M.D.   On: 07/22/2017 16:30   Ct Head Wo Contrast  Result Date: 07/23/2017 CLINICAL DATA:  62 y/o F; changes to left pupil in strength of left arm. EXAM: CT HEAD WITHOUT CONTRAST TECHNIQUE: Contiguous axial images were obtained from the base of the skull through the vertex without intravenous contrast. COMPARISON:  None. FINDINGS: Brain: No evidence of acute infarction, hemorrhage, hydrocephalus, extra-axial collection or mass lesion/mass effect. Vascular: Calcific atherosclerosis of carotid siphons. No hyperdense vessel. Skull: Normal. Negative for fracture or focal lesion. Sinuses/Orbits: No acute finding. Other: None. IMPRESSION: Negative CT of the head. Electronically Signed   By: Kristine Garbe M.D.   On: 07/23/2017 14:33   Nm Gi Blood Loss  Result Date: 07/20/2017 CLINICAL DATA:  GI bleeding EXAM: NUCLEAR MEDICINE GASTROINTESTINAL BLEEDING SCAN TECHNIQUE: Sequential abdominal images were obtained following intravenous administration of Tc-50m labeled red blood cells. RADIOPHARMACEUTICALS:  Twenty mCi Tc-37m pertechnetate in-vitro labeled red cells. COMPARISON:  None. FINDINGS: Normal distribution of radiotracer is noted. No focal area of tracer accumulation is identified to suggest active GI bleeding is noted. IMPRESSION: No findings to suggest active GI hemorrhage. Electronically Signed   By: Inez Catalina M.D.   On: 07/20/2017 15:16   US Venous Img Upper Bilat  Result Date: 07/16/2017 CLINICAL DATA:  Bilateral upper extremity edema acutely with pain and discomfort for 1 day EXAM: BILATERAL UPPER EXTREMITY VENOUS DOPPLER ULTRASOUND TECHNIQUE:  Gray-scale sonography with graded compression, as well as color Doppler and duplex ultrasound were performed to evaluate the bilateral upper extremity deep venous systems from the level of the subclavian vein and including the jugular, axillary, basilic, radial, ulnar and upper cephalic vein. Spectral Doppler  was utilized to evaluate flow at rest and with distal augmentation maneuvers. COMPARISON:  None. FINDINGS: Exam is limited because of upper extremity subcutaneous edema. RIGHT UPPER EXTREMITY Internal Jugular Vein: No evidence of thrombus. Normal compressibility, respiratory phasicity and response to augmentation. Subclavian Vein: No evidence of thrombus. Normal compressibility, respiratory phasicity and response to augmentation. Axillary Vein: No evidence of thrombus. Normal compressibility, respiratory phasicity and response to augmentation. Cephalic Vein: No evidence of thrombus. Normal compressibility, respiratory phasicity and response to augmentation. Basilic Vein: Not well visualized to accurately assess. Brachial Veins: No evidence of thrombus. Normal compressibility, respiratory phasicity and response to augmentation. Radial Veins: No evidence of thrombus. Normal compressibility, respiratory phasicity and response to augmentation. Ulnar Veins: No evidence of thrombus. Normal compressibility, respiratory phasicity and response to augmentation. Venous Reflux:  None. Other Findings:  Peripheral edema noted LEFT UPPER EXTREMITY Internal Jugular Vein: No evidence of thrombus. Normal compressibility, respiratory phasicity and response to augmentation. Subclavian Vein: No evidence of thrombus. Normal compressibility, respiratory phasicity and response to augmentation. Axillary Vein: No evidence of thrombus. Normal compressibility, respiratory phasicity and response to augmentation. Cephalic Vein: No evidence of thrombus. Normal compressibility, respiratory phasicity and response to augmentation. Basilic Vein: No evidence of thrombus. Normal compressibility, respiratory phasicity and response to augmentation. Brachial Veins: No evidence of thrombus. Normal compressibility, respiratory phasicity and response to augmentation. Radial Veins: No evidence of thrombus. Normal compressibility, respiratory phasicity and  response to augmentation. Ulnar Veins: No evidence of thrombus. Normal compressibility, respiratory phasicity and response to augmentation. Venous Reflux:  None. Other Findings:  But peripheral subcutaneous edema noted. IMPRESSION: Limited exam but no gross occlusive upper extremity DVT in either arm. Electronically Signed   By: Jerilynn Mages.  Shick M.D.   On: 07/16/2017 12:34   US Venous Img Upper Uni Left  Result Date: 07/25/2017 CLINICAL DATA:  Left upper extremity edema. EXAM: LEFT UPPER EXTREMITY VENOUS DOPPLER ULTRASOUND TECHNIQUE: Gray-scale sonography with graded compression, as well as color Doppler and duplex ultrasound were performed to evaluate the upper extremity deep venous system from the level of the subclavian vein and including the jugular, axillary, basilic, radial, ulnar and upper cephalic vein. Spectral Doppler was utilized to evaluate flow at rest and with distal augmentation maneuvers. COMPARISON:  None. FINDINGS: Contralateral Subclavian Vein: Respiratory phasicity is normal and symmetric with the symptomatic side. No evidence of thrombus. Normal compressibility. Internal Jugular Vein: No evidence of thrombus. Normal compressibility, respiratory phasicity and response to augmentation. Subclavian Vein: No evidence of thrombus. Normal compressibility, respiratory phasicity and response to augmentation. Axillary Vein: No evidence of thrombus. Normal compressibility, respiratory phasicity and response to augmentation. Cephalic Vein: No evidence of thrombus. Normal compressibility, respiratory phasicity and response to augmentation. Basilic Vein: No evidence of thrombus. Normal compressibility, respiratory phasicity and response to augmentation. Brachial Veins: No evidence of thrombus. Normal compressibility, respiratory phasicity and response to augmentation. Radial Veins: No evidence of thrombus. Normal compressibility, respiratory phasicity and response to augmentation. Ulnar Veins: No evidence of  thrombus. Normal compressibility, respiratory phasicity and response to augmentation. Venous Reflux:  None visualized. Other Findings: There is significant edema noted in the left upper arm including some more locally marginated fluid in the subcutaneous tissues of the left medial upper arm.  This fluid appears to extend over a long distance and likely represents subcutaneous fluid which is more prominent in 1 area of the upper arm. If there is any concern for soft tissue infection, additional imaging may be warranted. IMPRESSION: 1. No evidence of left upper extremity deep venous thrombosis. 2. Significant subcutaneous fluid in the left upper arm with an area of more marginated subcutaneous fluid also identified in the medial left upper arm. Although this does not have the appearance of a focal abscess by ultrasound, if there is concern for upper extremity infection, additional imaging may be warranted with CT and/or MRI. Electronically Signed   By: Aletta Edouard M.D.   On: 07/25/2017 17:48   Dg Chest Port 1 View  Result Date: 08/06/2017 CLINICAL DATA:  Chronic ventilator dependent respiratory failure. Follow-up RIGHT lower lobe atelectasis and/or pneumonia. EXAM: PORTABLE CHEST 1 VIEW COMPARISON:  08/05/2017, 08/04/2017 and earlier. FINDINGS: Tracheostomy tube tip in satisfactory position projecting below the thoracic inlet. RIGHT arm PICC tip projects over the LOWER SVC. Nasogastric tube courses below the diaphragm into the stomach. Cardiac silhouette upper normal in size for AP portable technique, unchanged. Dense airspace consolidation in the RIGHT lower lobe, unchanged, possibly associated with a RIGHT pleural effusion. LEFT lung clear, without evidence of recurrent LEFT lower lobe atelectasis as noted on the examination 2 days ago. No new pulmonary parenchymal abnormalities in either lung. IMPRESSION: 1.  Support apparatus satisfactory. 2. Stable dense RIGHT lower lobe atelectasis and/or pneumonia and  possible associated RIGHT pleural effusion. 3. No new abnormalities. Electronically Signed   By: Evangeline Dakin M.D.   On: 08/06/2017 08:43   Dg Chest Port 1 View  Result Date: 08/05/2017 CLINICAL DATA:  Acute respiratory failure EXAM: PORTABLE CHEST 1 VIEW COMPARISON:  08/04/2017 FINDINGS: Tracheostomy in good position. Right arm PICC tip in the mid SVC. NG tube in the stomach. Progression of right lower lobe atelectasis/infiltrate. Mild left lower lobe atelectasis slightly improved. Small right effusion. Negative for edema. IMPRESSION: Mild progression of right lower lobe airspace disease. Mild improvement in left lower lobe atelectasis. Electronically Signed   By: Franchot Gallo M.D.   On: 08/05/2017 07:33   Dg Chest Port 1 View  Result Date: 08/04/2017 CLINICAL DATA:  Pneumonia EXAM: PORTABLE CHEST 1 VIEW COMPARISON:  08/03/2017, 08/02/2017, 07/30/2017 FINDINGS: Tracheostomy tube is in place. Worsening airspace disease at the right lung base. No change in dense left lung base consolidation. Probable small effusions. Stable enlarged cardiomediastinal silhouette. IMPRESSION: 1. Worsening airspace disease at the right lung base may reflect worsening atelectasis or pneumonia 2. No change in dense left lower lobe atelectasis or pneumonia. Probable small pleural effusions. Electronically Signed   By: Donavan Foil M.D.   On: 08/04/2017 03:58   Dg Chest Port 1 View  Result Date: 08/03/2017 CLINICAL DATA:  Pneumonia EXAM: PORTABLE CHEST 1 VIEW COMPARISON:  08/02/2017 FINDINGS: Tracheostomy in good position. Right arm PICC tip in the SVC. NG tube in the stomach. Bibasilar airspace disease unchanged. Small left effusion. Negative for edema. IMPRESSION: Bibasilar atelectasis/infiltrate unchanged. Support lines remain in good position. Electronically Signed   By: Franchot Gallo M.D.   On: 08/03/2017 07:14   Dg Chest Port 1 View  Result Date: 08/02/2017 CLINICAL DATA:  Shortness of Breath EXAM: PORTABLE CHEST 1  VIEW COMPARISON:  August 01, 2017 FINDINGS: Tracheostomy catheter tip is 5.0 cm above the carina. Central catheter tip is in the superior vena cava. Nasogastric tube tip and side port are below  the diaphragm. No pneumothorax. There is airspace consolidation left lower lobe with small left pleural effusion. There is mild right base atelectasis. There is cardiomegaly with pulmonary vascularity within normal limits. No adenopathy. No bone lesions. IMPRESSION: Tube and catheter positions as described without evident pneumothorax. Airspace consolidation consistent with pneumonia left lower lobe with small left pleural effusion. Mild right base atelectasis. Stable cardiac prominence. Electronically Signed   By: Lowella Grip III M.D.   On: 08/02/2017 07:55   Dg Chest Port 1 View  Result Date: 08/01/2017 CLINICAL DATA:  Pneumonia EXAM: PORTABLE CHEST 1 VIEW COMPARISON:  07/30/2017, 07/29/2017, 07/27/2017, 07/23/2017 FINDINGS: Tracheostomy tube remains in place. Esophageal tube tip is below the diaphragm but non included. Interval opacification of the right diaphragm. No change in dense left lung base consolidation. Probable tiny pleural effusions. Enlarged cardiomediastinal silhouette is unchanged. Right upper extremity catheter tip overlies the SVC. IMPRESSION: 1. Increased opacity at the right base may reflect atelectasis 2. No significant change in probable small left effusion and dense left lung base consolidation, atelectasis versus pneumonia. 3. Stable cardiomegaly Electronically Signed   By: Donavan Foil M.D.   On: 08/01/2017 02:56   Dg Chest Port 1 View  Result Date: 07/30/2017 CLINICAL DATA:  Pneumonia EXAM: PORTABLE CHEST 1 VIEW COMPARISON:  07/29/2017 and prior radiographs FINDINGS: A tracheostomy tube, RIGHT PICC line with tip overlying the mid-LOWER SVC and endotracheal tube with tip overlying the distal esophagus again noted. Decreased LEFT LOWER lung consolidation/atelectasis noted. Mild RIGHT  basilar atelectasis again noted. There is no evidence of pneumothorax. No other interval changes identified. IMPRESSION: 1. Decreased LEFT LOWER lung consolidation/atelectasis. 2. NG tube with tip overlying the distal esophagus-recommend advancement. Electronically Signed   By: Margarette Canada M.D.   On: 07/30/2017 07:49   Dg Chest Port 1 View  Result Date: 07/29/2017 CLINICAL DATA:  Acute on chronic respiratory failure. EXAM: PORTABLE CHEST 1 VIEW COMPARISON:  Radiograph July 27, 2017. FINDINGS: Stable cardiomegaly. Tracheostomy is in grossly good position. Distal tip of nasogastric tube is seen in distal esophagus. No pneumothorax is noted. Right-sided PICC line is noted with distal tip in expected position of SVC. Mild bibasilar subsegmental atelectasis is noted. Bony thorax is unremarkable. IMPRESSION: Mild bibasilar subsegmental atelectasis. Endotracheal tube in grossly good position. Distal tip of nasogastric tube has withdrawn into distal esophagus. Electronically Signed   By: Marijo Conception, M.D.   On: 07/29/2017 22:01   Dg Chest Port 1 View  Result Date: 07/27/2017 CLINICAL DATA:  Excessive fluid EXAM: PORTABLE CHEST 1 VIEW COMPARISON:  Chest x-ray of July 23, 2017 FINDINGS: The lungs are well-expanded. There is a trace of pleural fluid on the right and slightly more on the left. The retrocardiac region remains dense. The cardiac silhouette is enlarged. The central pulmonary vascularity is prominent. The mediastinum is normal in width. The trachea is midline. The right-sided PICC line tip projects over the midportion of the SVC. The tracheostomy tube tip projects at the superior margin of the clavicular heads. The bony thorax exhibits no acute abnormality. IMPRESSION: Slight improved aeration of both lungs today. Small amounts of pleural fluid at both bases. Persistent atelectasis or pneumonia in the left lower lobe. Top-normal cardiac size with central pulmonary vascular congestion but no definite  pulmonary edema. Electronically Signed   By: David  Martinique M.D.   On: 07/27/2017 10:55   Dg Chest Port 1 View  Result Date: 07/23/2017 CLINICAL DATA:  Acute on chronic respiratory failure. Airway aspiration. EXAM:  PORTABLE CHEST 1 VIEW COMPARISON:  07/22/2017. FINDINGS: Interval enlargement of the cardiac silhouette. Stable dense left lower lobe airspace opacity. Interval increased linear and patchy density at the right lung base. Stable small bilateral pleural effusions. Nasogastric tube extending into the stomach. Thoracic spine degenerative changes. IMPRESSION: 1. No significant change in dense left lower lobe atelectasis or pneumonia and small left pleural effusion. 2. Increased right basilar atelectasis or pneumonia with a stable small right pleural effusion. Electronically Signed   By: Claudie Revering M.D.   On: 07/23/2017 16:09   Dg Chest Port 1 View  Result Date: 07/22/2017 CLINICAL DATA:  Respiratory failure.  COPD. EXAM: PORTABLE CHEST 1 VIEW COMPARISON:  07/19/2017. FINDINGS: The cardiac silhouette remains borderline enlarged. Stable left pleural effusion and left basilar airspace opacity. Small right pleural effusion, decreased, with decreased adjacent right basilar atelectasis. Thoracic spine degenerative changes. IMPRESSION: 1. Stable left pleural effusion and left lower lobe atelectasis or pneumonia. 2. Decreased right pleural fluid and right basilar atelectasis. Electronically Signed   By: Claudie Revering M.D.   On: 07/22/2017 09:14   Dg Chest Port 1 View  Result Date: 07/19/2017 CLINICAL DATA:  Respiratory failure EXAM: PORTABLE CHEST 1 VIEW COMPARISON:  July 15, 2017 FINDINGS: There are small pleural effusions bilaterally. There is consolidation in the left lower lobe. Lungs elsewhere are clear. Heart size is upper normal with pulmonary vascularity within normal limits. No adenopathy. There is degenerative change in the thoracic spine. IMPRESSION: Left lower lobe consolidation with small  pleural effusions bilaterally. There is mild bibasilar atelectasis. Stable cardiac silhouette. Electronically Signed   By: Lowella Grip III M.D.   On: 07/19/2017 07:57   Dg Chest Port 1 View  Result Date: 07/15/2017 CLINICAL DATA:  Acute respiratory failure. EXAM: PORTABLE CHEST 1 VIEW COMPARISON:  Chest x-ray from yesterday FINDINGS: The heart size and mediastinal contours are within normal limits. Normal pulmonary vascularity. Unchanged left greater than right bibasilar atelectasis and scarring at the costophrenic angles. No focal consolidation, pleural effusion, or pneumothorax. No acute osseous abnormality. IMPRESSION: Stable bibasilar atelectasis/scarring. Electronically Signed   By: Titus Dubin M.D.   On: 07/15/2017 10:41   Dg Chest Portable 1 View  Result Date: 07/14/2017 CLINICAL DATA:  Shortness of breath. EXAM: PORTABLE CHEST 1 VIEW COMPARISON:  06/05/2017. FINDINGS: Mediastinum and hilar structures normal. Stable mild cardiomegaly with normal pulmonary vascularity. Low lung volumes with mild basilar atelectasis. Stable bilateral pleural thickening consistent scarring. Degenerative changes thoracic spine. IMPRESSION: Low lung volumes with mild basilar atelectasis and/or pleuroparenchymal scarring again noted. Stable mild cardiomegaly. Chest is stable from prior exam. Electronically Signed   By: Marcello Moores  Register   On: 07/14/2017 17:13   Dg Abd Portable 1v  Result Date: 08/04/2017 CLINICAL DATA:  Nasogastric placement. EXAM: PORTABLE ABDOMEN - 1 VIEW COMPARISON:  07/31/2017 FINDINGS: Nasogastric tube enters the stomach, curves along the greater curvature and has its tip in the antrum. Moderate amount of intestinal gas is noted without obstructive pattern. IMPRESSION: Nasogastric tube tip in the gastric antrum. Electronically Signed   By: Nelson Chimes M.D.   On: 08/04/2017 19:23   [place] Ekg Site Rite  Result Date: 07/25/2017 If Site Rite image not attached, placement could not be  confirmed due to current cardiac rhythm.    CBC Recent Labs  Lab 08/01/17 0436 08/01/17 2342 08/02/17 0507 08/03/17 0539 08/04/17 0559 08/05/17 0754  WBC 9.3  --  13.4* 10.0 7.9 5.7  HGB 7.1* 9.5* 9.2* 9.0* 8.2* 9.0*  HCT 22.7*  --  28.7* 27.9* 26.1* 28.6*  PLT 346  --  322 268 257 270  MCV 92.2  --  90.9 91.7 91.4 91.6  MCH 28.9  --  29.2 29.7 28.8 28.9  MCHC 31.3*  --  32.2 32.4 31.5* 31.5*  RDW 16.8*  --  16.2* 15.7* 16.0* 15.7*  LYMPHSABS 0.7*  --  0.7* 0.6* 0.9*  --   MONOABS 0.5  --  0.7 0.6 0.6  --   EOSABS 0.2  --  0.2 0.1 0.2  --   BASOSABS 0.0  --  0.0 0.0 0.0  --     Chemistries  Recent Labs  Lab 08/02/17 0507 08/03/17 0539 08/04/17 0559 08/05/17 0754 08/06/17 0515 08/07/17 0622  NA 139 139 141 136 138 139  K 3.7 3.7 3.4* 4.3 3.6 4.0  CL 99* 99* 99* 99* 97* 100*  CO2 32 33* 35* 30 33* 33*  GLUCOSE 113* 118* 87 99 99 115*  BUN 16 20 17 16 18  21*  CREATININE <0.30* 0.47 <0.30* <0.30* <0.30* <0.30*  CALCIUM 9.1 9.0 9.0 9.1 9.4 9.2  MG 1.5* 1.9 1.7 1.8  --  1.8   ------------------------------------------------------------------------------------------------------------------ CrCl cannot be calculated (This lab value cannot be used to calculate CrCl because it is not a number: <0.30). ------------------------------------------------------------------------------------------------------------------ No results for input(s): HGBA1C in the last 72 hours. ------------------------------------------------------------------------------------------------------------------ No results for input(s): CHOL, HDL, LDLCALC, TRIG, CHOLHDL, LDLDIRECT in the last 72 hours. ------------------------------------------------------------------------------------------------------------------ No results for input(s): TSH, T4TOTAL, T3FREE, THYROIDAB in the last 72 hours.  Invalid input(s):  FREET3 ------------------------------------------------------------------------------------------------------------------ No results for input(s): VITAMINB12, FOLATE, FERRITIN, TIBC, IRON, RETICCTPCT in the last 72 hours.  Coagulation profile No results for input(s): INR, PROTIME in the last 168 hours.  No results for input(s): DDIMER in the last 72 hours.  Cardiac Enzymes No results for input(s): CKMB, TROPONINI, MYOGLOBIN in the last 168 hours.  Invalid input(s): CK ------------------------------------------------------------------------------------------------------------------ Invalid input(s): POCBNP    Assessment & Plan   62 year old female patient with history of chronic respiratory failure, GI bleed currently in the hospital for acute on chronic hypercapnic respiratory failure and anemia secondary to slow GI bleed.  1.Acute on chronic hypercapnic respiratory failure Has tracheostomy on ventilator support,  weaning trials as tolerated as per ICU physician and respiratory therapist protocol . Off antibiotics  2.  Moderate malnutrition   Continue NG tube feeding, appreciate GI following.,waiting for swallow evaluation  3. Electrolyte imbalance F/u magnesium and potassium  4.  Acute on chronic gastrointestinal bleeding s/p EGD 3/18 showing Normal duodenal bulb and second portion of the duodenum.Duodenal lipoma. Non-bleeding gastric ulcer with a clean ulcer base (Forrest Class III).Normal gastroesophageal junction and esophagus. - protonix 40 mg IV bid - 5. DVT prophylaxis with SCD  6.  Constipation, patient started on stool softeners, patient had a big BM yesterday.  7. euthyroid sick syndrome.:  Continue Synthyroid.  8.  Hypokalemia resolved  9.  Physical therapy for endurance training      Code Status Orders  (From admission, onward)        Start     Ordered   07/14/17 2115  Full code  Continuous     07/14/17 2122    Code Status History    Date  Active Date Inactive Code Status Order ID Comments User Context   06/05/2017 1654 06/08/2017 1820 Full Code [id number]  Bettey Costa, MD Inpatient   11/01/2016 0121 11/09/2016 2044 Full Code [id number]  Lance Coon, MD Inpatient  Time Spent in minutes   35  Greater than 50% of time spent in care coordination and counseling patient regarding the condition and plan of care.   Saundra Shelling M.D on 08/07/2017 at 1:17 PM  Between 7am to 6pm - Pager - [phone]  After 6pm go to [email or web address] - Proofreader  [employer]   Office  [phone]

## 2017-08-07 NOTE — Plan of Care (Signed)
Patient tolerating trach/vent.  Tylenol given x 3 this shift for pain in legs and throat.  No acute distress noted.  Good urine output. Will continue to monitor.

## 2017-08-07 NOTE — Progress Notes (Signed)
Physical Therapy Treatment Patient Details Name: Kayla Boyer MRN: [id number] DOB: 05-15-1955 Today's Date: 08/07/2017    History of Present Illness Kayla Boyer  is a 62 y.o. female with a known history of gastrointestinal bleeding, gastric ulcer, hyperlipidemia, emphysema of lung was referred by ER physician Dr.malinda for weakness and dark stools.  Patient presented to the emergency room with generalized weakness.  She has noticed a dark black stools for the last few days she was recently in our hospital last month and had an endoscopy which showed gastric ulcers she was discharged on oral proton pump inhibitors.  Patient is lethargic and weak.  Has nausea and vomitings.  No complaints of any chest pain, abdominal pain.  No fever and chills.Hemoglobin dropped from 11.7 to 9.7. Stool for occult blood is positive.  Diagnosis of GI bleed and then developed acute resp distress and placed on BIPAP and then required a ventilator. She is currently vent dependent and did not tolerate weaning over the weekend to a lower setting. NG tube in place and NPO.     PT Comments    Pt is making good progress towards goals with improved there-ex progress this date. Still with weakness present on B LE and feet in plantar flexion. Improved ROM noted 15 degrees on R foot in PF and 40 degrees in L foot in plantar flexion. Educated on positioning and encouraged to continue to promote dorsiflexion. Able to sit at EOB with improved endurance noted this session. Of note, pt on full vent support at this time. Will continue to progress as tolerated.    Follow Up Recommendations  LTACH     Equipment Recommendations       Recommendations for Other Services       Precautions / Restrictions Precautions Precautions: Fall Restrictions Weight Bearing Restrictions: No    Mobility  Bed Mobility Overal bed mobility: Needs Assistance Bed Mobility: Supine to Sit     Supine to sit: Max assist;+2 for physical assistance      General bed mobility comments: able to reach for railing with R arm, however still needs heavy +2 assist to sit at EOB. ONce seated at EOB, able to maintain balance with cga and B UE support. Able to maintain seated position for 5 minutes this date. Pt on full vent support, O2 sats decreased to 88%. Heavy neck flexion noted  Transfers                 General transfer comment: unable to perform  Ambulation/Gait                 Stairs            Wheelchair Mobility    Modified Rankin (Stroke Patients Only)       Balance                                            Cognition Arousal/Alertness: Awake/alert Behavior During Therapy: WFL for tasks assessed/performed Overall Cognitive Status: Within Functional Limits for tasks assessed                                 General Comments: Of note, pt writes on clipboard, received larger pen      Exercises Other Exercises Other Exercises: Supine ther-ex performed on B LE including ankle pumps (  max assist on L and min assist on R), quad sets, SAQ, SLRs, and hip abd/add. All ther-ex performed x 12 reps. Also performed 3 reps of dorsiflexion stretch x 30 second each as pt lacking neutral position. Discussed with RN about positioning. Placed in boots in netural position as well as positioned B arms. Wrote ther-ex on white board and discussed with pt and RN on frequency and duration. O2 sats maintained during entire session.    General Comments        Pertinent Vitals/Pain Pain Assessment: No/denies pain    Home Living                      Prior Function            PT Goals (current goals can now be found in the care plan section) Acute Rehab PT Goals Patient Stated Goal: "to get stronger and take care of myself" PT Goal Formulation: With patient Time For Goal Achievement: 08/14/17 Potential to Achieve Goals: Good Progress towards PT goals: Progressing toward goals     Frequency    Min 2X/week      PT Plan Current plan remains appropriate    Co-evaluation              AM-PAC PT "6 Clicks" Daily Activity  Outcome Measure  Difficulty turning over in bed (including adjusting bedclothes, sheets and blankets)?: Unable Difficulty moving from lying on back to sitting on the side of the bed? : Unable Difficulty sitting down on and standing up from a chair with arms (e.g., wheelchair, bedside commode, etc,.)?: Unable Help needed moving to and from a bed to chair (including a wheelchair)?: Total Help needed walking in hospital room?: Total Help needed climbing 3-5 steps with a railing? : Total 6 Click Score: 6    End of Session Equipment Utilized During Treatment: (vent) Activity Tolerance: Patient tolerated treatment well Patient left: in bed;with bed alarm set Nurse Communication: Mobility status PT Visit Diagnosis: Muscle weakness (generalized) (M62.81);Difficulty in walking, not elsewhere classified (R26.2)     Time: 1057-1130 PT Time Calculation (min) (ACUTE ONLY): 33 min  Charges:  $Therapeutic Exercise: 8-22 mins $Therapeutic Activity: 8-22 mins                    G Codes:       Greggory Stallion, PT, DPT [phone]    Deseree Zemaitis 08/07/2017, 11:59 AM

## 2017-08-07 NOTE — Progress Notes (Addendum)
Pharmacy Antibiotic Note  Kayla Boyer is a 62 y.o. female admitted on 07/14/2017 with pneumonia.  Pharmacy has been consulted for cefepime dosing to cover possible HCAP. Patient transitioned from Zosyn to cefepime on 4/5; with total duration of antibiotics for 7 days. Procalcitonin 0.1.   Plan: Cefepime stopped by CCM.  Height: 5' 2.5" (158.8 cm) Weight: 137 lb 2 oz (62.2 kg) IBW/kg (Calculated) : 51.25  Temp (24hrs), Avg:99.2 F (37.3 C), Min:98.6 F (37 C), Max:99.7 F (37.6 C)  Recent Labs  Lab 08/01/17 0436 08/02/17 0507 08/03/17 0539 08/04/17 0559 08/05/17 0754 08/06/17 0515 08/07/17 0622  WBC 9.3 13.4* 10.0 7.9 5.7  --   --   CREATININE <0.30* <0.30* 0.47 <0.30* <0.30* <0.30* <0.30*    CrCl cannot be calculated (This lab value cannot be used to calculate CrCl because it is not a number: <0.30).    No Known Allergies  Antimicrobials this admission: Zosyn 4/3 >> 4/5 Cefepime 4/5 >> 4/8  Dose adjustments this admission: N/A  Microbiology results: 4/3 Sputum: normal flora 4/7 Sputum: no organisms seen  3/16 MRSA PCR: negative   Thank you for allowing pharmacy to be a part of this patient's care.  Mardell Suttles L 08/07/2017 9:22 AM

## 2017-08-08 DIAGNOSIS — Z4659 Encounter for fitting and adjustment of other gastrointestinal appliance and device: Secondary | ICD-10-CM

## 2017-08-08 DIAGNOSIS — R1310 Dysphagia, unspecified: Secondary | ICD-10-CM

## 2017-08-08 LAB — PREALBUMIN: Prealbumin: 21.3 mg/dL (ref 18–38)

## 2017-08-08 LAB — GLUCOSE, CAPILLARY
GLUCOSE-CAPILLARY: 105 mg/dL — AB (ref 65–99)
Glucose-Capillary: 117 mg/dL — ABNORMAL HIGH (ref 65–99)

## 2017-08-08 LAB — FERRITIN: Ferritin: 176 ng/mL (ref 11–307)

## 2017-08-08 MED ORDER — PANTOPRAZOLE SODIUM 40 MG PO PACK
40.0000 mg | PACK | Freq: Two times a day (BID) | ORAL | Status: DC
  Administered 2017-08-08 – 2017-08-09 (×4): 40 mg
  Filled 2017-08-08 (×4): qty 20

## 2017-08-08 NOTE — Progress Notes (Signed)
Occupational Therapy Treatment Patient Details Name: Kayla Boyer MRN: [id number] DOB: 1955/09/10 Today's Date: 08/08/2017    History of present illness Raizel Wesolowski  is a 62 y.o. female with a known history of gastrointestinal bleeding, gastric ulcer, hyperlipidemia, emphysema of lung was referred by ER physician Dr.malinda for weakness and dark stools.  Patient presented to the emergency room with generalized weakness.  She has noticed a dark black stools for the last few days she was recently in our hospital last month and had an endoscopy which showed gastric ulcers she was discharged on oral proton pump inhibitors.  Patient is lethargic and weak.  Has nausea and vomitings.  No complaints of any chest pain, abdominal pain.  No fever and chills.Hemoglobin dropped from 11.7 to 9.7. Stool for occult blood is positive.  Diagnosis of GI bleed and then developed acute resp distress and placed on BIPAP and then required a ventilator. She is currently vent dependent and did not tolerate weaning over the weekend to a lower setting. NG tube in place and NPO.    OT comments  Pt seen for OT treatment this date, agreeable to OT session. Spoke with RN who cleared her to participate. Pt instructed in there-ex with theraputty and theraband to increase BUE strength and activity tolerance. Pt able to perform grooming tasks at bed level with set up and no additional assist which is an improvement from initial evaluation. Pt motivated to participate. Will continue to progress.    Follow Up Recommendations  LTACH    Equipment Recommendations       Recommendations for Other Services      Precautions / Restrictions Precautions Precautions: Fall Restrictions Weight Bearing Restrictions: No       Mobility Bed Mobility                  Transfers                      Balance                                           ADL either performed or assessed with clinical  judgement   ADL Overall ADL's : Needs assistance/impaired Eating/Feeding: NPO Eating/Feeding Details (indicate cue type and reason): G tube, scheduled for peg tube Grooming: Bed level;Set up;Wash/dry face;Wash/dry hands Grooming Details (indicate cue type and reason): Pt able to wash face and hands with set up of wash cloth and no physical assist required                                     Vision Patient Visual Report: No change from baseline     Perception     Praxis      Cognition Arousal/Alertness: Awake/alert Behavior During Therapy: WFL for tasks assessed/performed Overall Cognitive Status: Within Functional Limits for tasks assessed                                 General Comments: Of note, pt writes on clipboard, received larger pen        Exercises Other Exercises Other Exercises: Pt performed grip strength and pinch exercises with pink theraputty with handout for reference  Other Exercises: Pt instructed in theraband BUE exercises  using yellow theraband with focus on elbow flexion, shoulder external rotation, shoulder flexion    Shoulder Instructions       General Comments      Pertinent Vitals/ Pain       Pain Assessment: No/denies pain(pt wrote down that she just recently received tylenol)  Home Living                                          Prior Functioning/Environment              Frequency  Min 2X/week        Progress Toward Goals  OT Goals(current goals can now be found in the care plan section)  Progress towards OT goals: Progressing toward goals  Acute Rehab OT Goals Patient Stated Goal: "to get stronger and take care of myself" OT Goal Formulation: With patient Time For Goal Achievement: 08/14/17 Potential to Achieve Goals: [place] Discharge plan remains appropriate;Frequency remains appropriate    Co-evaluation                 AM-PAC PT "6 Clicks" Daily Activity      Outcome Measure   Help from another person eating meals?: Total Help from another person taking care of personal grooming?: A Little Help from another person toileting, which includes using toliet, bedpan, or urinal?: Total Help from another person bathing (including washing, rinsing, drying)?: A Lot Help from another person to put on and taking off regular upper body clothing?: A Lot Help from another person to put on and taking off regular lower body clothing?: Total 6 Click Score: 10    End of Session    OT Visit Diagnosis: Muscle weakness (generalized) (M62.81);Feeding difficulties (R63.3);Other abnormalities of gait and mobility (R26.89)   Activity Tolerance Patient tolerated treatment well   Patient Left in bed;with call bell/phone within reach;with bed alarm set   Nurse Communication          Time: 0315-9458 OT Time Calculation (min): 26 min  Charges: OT Treatments $Self Care/Home Management : 8-22 mins $Therapeutic Exercise: 8-22 mins  Jeni Salles, MPH, MS, OTR/L ascom [phone] 08/08/17, 4:13 PM

## 2017-08-08 NOTE — Clinical Social Work Note (Signed)
Now that patient is receiving a PEG today, CSW has faxed out patient's referral to the vent/snf facilities that are in the general area. This includes: [facility] in [place], [facility], [place], [facility] in [place], [place]. [place] has left message for patient's son to discuss this. Shela Leff MSW,LCSW [phone]

## 2017-08-08 NOTE — Progress Notes (Signed)
PT Cancellation Note  Patient Details Name: Kayla Boyer MRN: [id number] DOB: Sep 11, 1955   Cancelled Treatment:    Reason Eval/Treat Not Completed: Other (comment). Per notes, pt scheduled for PEG placement this date. Will hold at this time. Will need new orders s/p SX for continuing therapy. Please re-order when medically stable.   Armanda Forand 08/08/2017, 2:05 PM Greggory Stallion, PT, DPT [phone]

## 2017-08-08 NOTE — Progress Notes (Signed)
[place] at [facility]                                                                                                                                                                                  Patient Demographics   Kayla Boyer, is a 62 y.o. female, DOB - 19-Apr-1956, OMV:[id number]  Admit date - 07/14/2017   Admitting Physician Saundra Shelling, MD  Outpatient Primary MD for the patient is Patient, [name]   LOS - 25  Subjective: Patient seen and evaluated today On tracheostomy with vent support Communicates by writing on the pad and on verbal commands No complaints of pain Has a NG tube feeding   Review of Systems:   CONSTITUTIONAL: No documented fever. No fatigue, weakness. No weight gain, no weight loss.  EYES: No blurry or double vision.  ENT: No tinnitus. No postnasal drip. No redness of the oropharynx.  Has tracheostomy RESPIRATORY: No cough, no wheeze, no hemoptysis. No dyspnea.  CARDIOVASCULAR: No chest pain. No orthopnea. No palpitations. No syncope.  GASTROINTESTINAL: No nausea, no vomiting or diarrhea. No abdominal pain. No melena or hematochezia. GENITOURINARY: No dysuria or hematuria.  ENDOCRINE: No polyuria or nocturia. No heat or cold intolerance.  HEMATOLOGY: No anemia. No bruising. No bleeding.  INTEGUMENTARY: No rashes. No lesions.  MUSCULOSKELETAL: No arthritis. No swelling. No gout.  NEUROLOGIC: No numbness, tingling, or ataxia. No seizure-type activity.  PSYCHIATRIC: No anxiety. No insomnia. No ADD.    Vitals:   Vitals:   08/08/17 0820 08/08/17 0900 08/08/17 1000 08/08/17 1100  BP:  106/86 117/78 115/77  Pulse:  (!) 101 100 98  Resp:  (!) 22 (!) 25 (!) 29  Temp:      TempSrc:      SpO2: 97% 96% 98% 97%  Weight:      Height:        Wt Readings from Last 3 Encounters:  08/08/17 55.6 kg (122 lb 9.2 oz)  07/05/17 60.8 kg (134 lb)  07/04/17 60.8 kg (134 lb)     Intake/Output Summary (Last 24 hours) at  08/08/2017 1301 Last data filed at 08/08/2017 1100 Gross per 24 hour  Intake 1500 ml  Output 695 ml  Net 805 ml    Physical Exam:   GENERAL: Pleasant-appearing female patient lying on the bed with ventilator support via tracheostomy HEAD, EYES, EARS, NOSE AND THROAT: Atraumatic, normocephalic. Extraocular muscles are intact. Pupils equal and reactive to light. Sclerae anicteric. No conjunctival injection. No oro-pharyngeal erythema.  NECK: Supple. There is no jugular venous distention. No bruits, no lymphadenopathy, no thyromegaly.  Has tracheostomy, Has NGT for feeds HEART: Regular rate and rhythm,. No  murmurs, no rubs, no clicks.  LUNGS: Clear to auscultation bilaterally. No rales or rhonchi. No wheezes.  Has tracheostomy with ventilator support Tidal volume 450 rate 15 PEEP 5 FiO2 30% ABDOMEN: Soft, flat, nontender, nondistended. Has good bowel sounds. No hepatosplenomegaly appreciated.  EXTREMITIES: No evidence of any cyanosis, clubbing, or peripheral edema.  +2 pedal and radial pulses bilaterally.  NEUROLOGIC: The patient is alert, awake, and oriented x3 with no focal motor or sensory deficits appreciated bilaterally.  SKIN: Moist and warm with no rashes appreciated.  Psych: Not anxious, depressed LN: No inguinal LN enlargement    Antibiotics   Anti-infectives (From admission, onward)   Start     Dose/Rate Route Frequency Ordered Stop   08/04/17 2200  ceFEPIme (MAXIPIME) 1 g in sodium chloride 0.9 % 100 mL IVPB  Status:  Discontinued     1 g 200 mL/hr over 30 Minutes Intravenous Every 8 hours 08/04/17 1218 08/04/17 1448   08/04/17 2200  ceFEPIme (MAXIPIME) 1 g in sodium chloride 0.9 % 100 mL IVPB  Status:  Discontinued     1 g 200 mL/hr over 30 Minutes Intravenous Every 8 hours 08/04/17 1448 08/07/17 1119   08/03/17 0100  piperacillin-tazobactam (ZOSYN) IVPB 3.375 g  Status:  Discontinued     3.375 g 12.5 mL/hr over 240 Minutes Intravenous Every 8 hours 08/02/17 1701 08/02/17  1704   08/02/17 1730  piperacillin-tazobactam (ZOSYN) IVPB 3.375 g  Status:  Discontinued     3.375 g 12.5 mL/hr over 240 Minutes Intravenous Every 8 hours 08/02/17 1704 08/04/17 1217   08/02/17 1645  piperacillin-tazobactam (ZOSYN) IVPB 3.375 g  Status:  Discontinued     3.375 g 12.5 mL/hr over 240 Minutes Intravenous Every 8 hours 08/02/17 1639 08/02/17 1701   07/14/17 2200  azithromycin (ZITHROMAX) 500 mg in sodium chloride 0.9 % 250 mL IVPB  Status:  Discontinued     500 mg 250 mL/hr over 60 Minutes Intravenous Every 24 hours 07/14/17 2122 07/18/17 0915      Medications   Scheduled Meds: . chlorhexidine  15 mL Mouth Rinse BID  . folic acid  1 mg Oral Daily  . free water  100 mL Per Tube Q6H  . ipratropium-albuterol  3 mL Nebulization TID  . liver oil-zinc oxide   Topical Daily  . mouth rinse  15 mL Mouth Rinse QID  . mirtazapine  15 mg Per Tube QHS  . multivitamin  15 mL Per Tube Daily  . pantoprazole sodium  40 mg Per Tube BID  . senna-docusate  2 tablet Oral BID  . sodium chloride flush  10-40 mL Intracatheter Q12H  . thyroid  30 mg Per Tube QAC breakfast   Continuous Infusions: . feeding supplement (VITAL HIGH PROTEIN) 50 mL/hr at 08/08/17 0600   PRN Meds:.acetaminophen, ALPRAZolam, bisacodyl, metoprolol tartrate, [DISCONTINUED] ondansetron **OR** ondansetron (ZOFRAN) IV, simethicone, sodium chloride flush   Data Review:   Micro Results Recent Results (from the past 240 hour(s))  Culture, respiratory (NON-Expectorated)     Status: None   Collection Time: 08/02/17  6:23 PM  Result Value Ref Range Status   Specimen Description   Final    TRACHEAL ASPIRATE Performed at [facility], [address] [place], [place] [address]    Special Requests   Final    NONE Performed at [facility], [address] [place], [place] [address]    Gram Stain   Final    ABUNDANT WBC PRESENT, PREDOMINANTLY PMN FEW GRAM  POSITIVE RODS    Culture   Final     Consistent with normal respiratory flora. Performed at [facility], [place] [address], [place], [place] [address]    Report Status 08/05/2017 FINAL  Final  Culture, respiratory (NON-Expectorated)     Status: None (Preliminary result)   Collection Time: 08/06/17  9:32 PM  Result Value Ref Range Status   Specimen Description   Final    INDUCED SPUTUM Performed at [facility], [address], [place], [place] [address]    Special Requests   Final    NONE Performed at [facility], [address] [place], [place] [address]    Gram Stain   Final    RARE WBC PRESENT, PREDOMINANTLY PMN NO ORGANISMS SEEN    Culture   Final    CULTURE REINCUBATED FOR BETTER GROWTH Performed at [facility], [place] [address], [place], [place] [address]    Report Status PENDING  Incomplete    Radiology Reports Dg Abd 1 View  Result Date: 07/31/2017 CLINICAL DATA:  Nasogastric tube placement. EXAM: ABDOMEN - 1 VIEW COMPARISON:  Radiograph July 22, 2017. FINDINGS: The bowel gas pattern is normal. Distal tip of feeding tube is seen in distal stomach. No radio-opaque calculi or other significant radiographic abnormality are seen. IMPRESSION: Distal tip of nasogastric tube seen in distal stomach. No evidence of bowel obstruction or ileus. Electronically Signed   By: Marijo Conception, M.D.   On: 07/31/2017 11:47   Dg Abd 1 View  Result Date: 07/22/2017 CLINICAL DATA:  Encounter for feeding tube placement. EXAM: ABDOMEN - 1 VIEW COMPARISON:  Chest radiograph 07/22/2017 FINDINGS: A nasogastric tube has been advanced into the abdomen. Catheter tip is in the distal stomach region. Bowel gas throughout the abdomen. Persistent densities at the left lung base and the left hemidiaphragm remains obscured. IMPRESSION: Feeding tube tip in the distal stomach region. Electronically Signed   By: Markus Daft M.D.   On: 07/22/2017 16:30   Ct Head Wo Contrast  Result Date: 07/23/2017 CLINICAL DATA:  62  y/o F; changes to left pupil in strength of left arm. EXAM: CT HEAD WITHOUT CONTRAST TECHNIQUE: Contiguous axial images were obtained from the base of the skull through the vertex without intravenous contrast. COMPARISON:  None. FINDINGS: Brain: No evidence of acute infarction, hemorrhage, hydrocephalus, extra-axial collection or mass lesion/mass effect. Vascular: Calcific atherosclerosis of carotid siphons. No hyperdense vessel. Skull: Normal. Negative for fracture or focal lesion. Sinuses/Orbits: No acute finding. Other: None. IMPRESSION: Negative CT of the head. Electronically Signed   By: Kristine Garbe M.D.   On: 07/23/2017 14:33   Nm Gi Blood Loss  Result Date: 07/20/2017 CLINICAL DATA:  GI bleeding EXAM: NUCLEAR MEDICINE GASTROINTESTINAL BLEEDING SCAN TECHNIQUE: Sequential abdominal images were obtained following intravenous administration of Tc-48m labeled red blood cells. RADIOPHARMACEUTICALS:  Twenty mCi Tc-27m pertechnetate in-vitro labeled red cells. COMPARISON:  None. FINDINGS: Normal distribution of radiotracer is noted. No focal area of tracer accumulation is identified to suggest active GI bleeding is noted. IMPRESSION: No findings to suggest active GI hemorrhage. Electronically Signed   By: Inez Catalina M.D.   On: 07/20/2017 15:16   US Venous Img Upper Bilat  Result Date: 07/16/2017 CLINICAL DATA:  Bilateral upper extremity edema acutely with pain and discomfort for 1 day EXAM: BILATERAL UPPER EXTREMITY VENOUS DOPPLER ULTRASOUND TECHNIQUE: Gray-scale sonography with graded compression, as well as color Doppler and duplex ultrasound were performed to evaluate the  bilateral upper extremity deep venous systems from the level of the subclavian vein and including the jugular, axillary, basilic, radial, ulnar and upper cephalic vein. Spectral Doppler was utilized to evaluate flow at rest and with distal augmentation maneuvers. COMPARISON:  None. FINDINGS: Exam is limited because of upper  extremity subcutaneous edema. RIGHT UPPER EXTREMITY Internal Jugular Vein: No evidence of thrombus. Normal compressibility, respiratory phasicity and response to augmentation. Subclavian Vein: No evidence of thrombus. Normal compressibility, respiratory phasicity and response to augmentation. Axillary Vein: No evidence of thrombus. Normal compressibility, respiratory phasicity and response to augmentation. Cephalic Vein: No evidence of thrombus. Normal compressibility, respiratory phasicity and response to augmentation. Basilic Vein: Not well visualized to accurately assess. Brachial Veins: No evidence of thrombus. Normal compressibility, respiratory phasicity and response to augmentation. Radial Veins: No evidence of thrombus. Normal compressibility, respiratory phasicity and response to augmentation. Ulnar Veins: No evidence of thrombus. Normal compressibility, respiratory phasicity and response to augmentation. Venous Reflux:  None. Other Findings:  Peripheral edema noted LEFT UPPER EXTREMITY Internal Jugular Vein: No evidence of thrombus. Normal compressibility, respiratory phasicity and response to augmentation. Subclavian Vein: No evidence of thrombus. Normal compressibility, respiratory phasicity and response to augmentation. Axillary Vein: No evidence of thrombus. Normal compressibility, respiratory phasicity and response to augmentation. Cephalic Vein: No evidence of thrombus. Normal compressibility, respiratory phasicity and response to augmentation. Basilic Vein: No evidence of thrombus. Normal compressibility, respiratory phasicity and response to augmentation. Brachial Veins: No evidence of thrombus. Normal compressibility, respiratory phasicity and response to augmentation. Radial Veins: No evidence of thrombus. Normal compressibility, respiratory phasicity and response to augmentation. Ulnar Veins: No evidence of thrombus. Normal compressibility, respiratory phasicity and response to augmentation.  Venous Reflux:  None. Other Findings:  But peripheral subcutaneous edema noted. IMPRESSION: Limited exam but no gross occlusive upper extremity DVT in either arm. Electronically Signed   By: Jerilynn Mages.  Shick M.D.   On: 07/16/2017 12:34   US Venous Img Upper Uni Left  Result Date: 07/25/2017 CLINICAL DATA:  Left upper extremity edema. EXAM: LEFT UPPER EXTREMITY VENOUS DOPPLER ULTRASOUND TECHNIQUE: Gray-scale sonography with graded compression, as well as color Doppler and duplex ultrasound were performed to evaluate the upper extremity deep venous system from the level of the subclavian vein and including the jugular, axillary, basilic, radial, ulnar and upper cephalic vein. Spectral Doppler was utilized to evaluate flow at rest and with distal augmentation maneuvers. COMPARISON:  None. FINDINGS: Contralateral Subclavian Vein: Respiratory phasicity is normal and symmetric with the symptomatic side. No evidence of thrombus. Normal compressibility. Internal Jugular Vein: No evidence of thrombus. Normal compressibility, respiratory phasicity and response to augmentation. Subclavian Vein: No evidence of thrombus. Normal compressibility, respiratory phasicity and response to augmentation. Axillary Vein: No evidence of thrombus. Normal compressibility, respiratory phasicity and response to augmentation. Cephalic Vein: No evidence of thrombus. Normal compressibility, respiratory phasicity and response to augmentation. Basilic Vein: No evidence of thrombus. Normal compressibility, respiratory phasicity and response to augmentation. Brachial Veins: No evidence of thrombus. Normal compressibility, respiratory phasicity and response to augmentation. Radial Veins: No evidence of thrombus. Normal compressibility, respiratory phasicity and response to augmentation. Ulnar Veins: No evidence of thrombus. Normal compressibility, respiratory phasicity and response to augmentation. Venous Reflux:  None visualized. Other Findings: There is  significant edema noted in the left upper arm including some more locally marginated fluid in the subcutaneous tissues of the left medial upper arm. This fluid appears to extend over a long distance and likely represents subcutaneous fluid which is more prominent  in 1 area of the upper arm. If there is any concern for soft tissue infection, additional imaging may be warranted. IMPRESSION: 1. No evidence of left upper extremity deep venous thrombosis. 2. Significant subcutaneous fluid in the left upper arm with an area of more marginated subcutaneous fluid also identified in the medial left upper arm. Although this does not have the appearance of a focal abscess by ultrasound, if there is concern for upper extremity infection, additional imaging may be warranted with CT and/or MRI. Electronically Signed   By: Aletta Edouard M.D.   On: 07/25/2017 17:48   Dg Chest Port 1 View  Result Date: 08/06/2017 CLINICAL DATA:  Chronic ventilator dependent respiratory failure. Follow-up RIGHT lower lobe atelectasis and/or pneumonia. EXAM: PORTABLE CHEST 1 VIEW COMPARISON:  08/05/2017, 08/04/2017 and earlier. FINDINGS: Tracheostomy tube tip in satisfactory position projecting below the thoracic inlet. RIGHT arm PICC tip projects over the LOWER SVC. Nasogastric tube courses below the diaphragm into the stomach. Cardiac silhouette upper normal in size for AP portable technique, unchanged. Dense airspace consolidation in the RIGHT lower lobe, unchanged, possibly associated with a RIGHT pleural effusion. LEFT lung clear, without evidence of recurrent LEFT lower lobe atelectasis as noted on the examination 2 days ago. No new pulmonary parenchymal abnormalities in either lung. IMPRESSION: 1.  Support apparatus satisfactory. 2. Stable dense RIGHT lower lobe atelectasis and/or pneumonia and possible associated RIGHT pleural effusion. 3. No new abnormalities. Electronically Signed   By: Evangeline Dakin M.D.   On: 08/06/2017 08:43    Dg Chest Port 1 View  Result Date: 08/05/2017 CLINICAL DATA:  Acute respiratory failure EXAM: PORTABLE CHEST 1 VIEW COMPARISON:  08/04/2017 FINDINGS: Tracheostomy in good position. Right arm PICC tip in the mid SVC. NG tube in the stomach. Progression of right lower lobe atelectasis/infiltrate. Mild left lower lobe atelectasis slightly improved. Small right effusion. Negative for edema. IMPRESSION: Mild progression of right lower lobe airspace disease. Mild improvement in left lower lobe atelectasis. Electronically Signed   By: Franchot Gallo M.D.   On: 08/05/2017 07:33   Dg Chest Port 1 View  Result Date: 08/04/2017 CLINICAL DATA:  Pneumonia EXAM: PORTABLE CHEST 1 VIEW COMPARISON:  08/03/2017, 08/02/2017, 07/30/2017 FINDINGS: Tracheostomy tube is in place. Worsening airspace disease at the right lung base. No change in dense left lung base consolidation. Probable small effusions. Stable enlarged cardiomediastinal silhouette. IMPRESSION: 1. Worsening airspace disease at the right lung base may reflect worsening atelectasis or pneumonia 2. No change in dense left lower lobe atelectasis or pneumonia. Probable small pleural effusions. Electronically Signed   By: Donavan Foil M.D.   On: 08/04/2017 03:58   Dg Chest Port 1 View  Result Date: 08/03/2017 CLINICAL DATA:  Pneumonia EXAM: PORTABLE CHEST 1 VIEW COMPARISON:  08/02/2017 FINDINGS: Tracheostomy in good position. Right arm PICC tip in the SVC. NG tube in the stomach. Bibasilar airspace disease unchanged. Small left effusion. Negative for edema. IMPRESSION: Bibasilar atelectasis/infiltrate unchanged. Support lines remain in good position. Electronically Signed   By: Franchot Gallo M.D.   On: 08/03/2017 07:14   Dg Chest Port 1 View  Result Date: 08/02/2017 CLINICAL DATA:  Shortness of Breath EXAM: PORTABLE CHEST 1 VIEW COMPARISON:  August 01, 2017 FINDINGS: Tracheostomy catheter tip is 5.0 cm above the carina. Central catheter tip is in the superior vena  cava. Nasogastric tube tip and side port are below the diaphragm. No pneumothorax. There is airspace consolidation left lower lobe with small left pleural effusion. There is  mild right base atelectasis. There is cardiomegaly with pulmonary vascularity within normal limits. No adenopathy. No bone lesions. IMPRESSION: Tube and catheter positions as described without evident pneumothorax. Airspace consolidation consistent with pneumonia left lower lobe with small left pleural effusion. Mild right base atelectasis. Stable cardiac prominence. Electronically Signed   By: Lowella Grip III M.D.   On: 08/02/2017 07:55   Dg Chest Port 1 View  Result Date: 08/01/2017 CLINICAL DATA:  Pneumonia EXAM: PORTABLE CHEST 1 VIEW COMPARISON:  07/30/2017, 07/29/2017, 07/27/2017, 07/23/2017 FINDINGS: Tracheostomy tube remains in place. Esophageal tube tip is below the diaphragm but non included. Interval opacification of the right diaphragm. No change in dense left lung base consolidation. Probable tiny pleural effusions. Enlarged cardiomediastinal silhouette is unchanged. Right upper extremity catheter tip overlies the SVC. IMPRESSION: 1. Increased opacity at the right base may reflect atelectasis 2. No significant change in probable small left effusion and dense left lung base consolidation, atelectasis versus pneumonia. 3. Stable cardiomegaly Electronically Signed   By: Donavan Foil M.D.   On: 08/01/2017 02:56   Dg Chest Port 1 View  Result Date: 07/30/2017 CLINICAL DATA:  Pneumonia EXAM: PORTABLE CHEST 1 VIEW COMPARISON:  07/29/2017 and prior radiographs FINDINGS: A tracheostomy tube, RIGHT PICC line with tip overlying the mid-LOWER SVC and endotracheal tube with tip overlying the distal esophagus again noted. Decreased LEFT LOWER lung consolidation/atelectasis noted. Mild RIGHT basilar atelectasis again noted. There is no evidence of pneumothorax. No other interval changes identified. IMPRESSION: 1. Decreased LEFT LOWER  lung consolidation/atelectasis. 2. NG tube with tip overlying the distal esophagus-recommend advancement. Electronically Signed   By: Margarette Canada M.D.   On: 07/30/2017 07:49   Dg Chest Port 1 View  Result Date: 07/29/2017 CLINICAL DATA:  Acute on chronic respiratory failure. EXAM: PORTABLE CHEST 1 VIEW COMPARISON:  Radiograph July 27, 2017. FINDINGS: Stable cardiomegaly. Tracheostomy is in grossly good position. Distal tip of nasogastric tube is seen in distal esophagus. No pneumothorax is noted. Right-sided PICC line is noted with distal tip in expected position of SVC. Mild bibasilar subsegmental atelectasis is noted. Bony thorax is unremarkable. IMPRESSION: Mild bibasilar subsegmental atelectasis. Endotracheal tube in grossly good position. Distal tip of nasogastric tube has withdrawn into distal esophagus. Electronically Signed   By: Marijo Conception, M.D.   On: 07/29/2017 22:01   Dg Chest Port 1 View  Result Date: 07/27/2017 CLINICAL DATA:  Excessive fluid EXAM: PORTABLE CHEST 1 VIEW COMPARISON:  Chest x-ray of July 23, 2017 FINDINGS: The lungs are well-expanded. There is a trace of pleural fluid on the right and slightly more on the left. The retrocardiac region remains dense. The cardiac silhouette is enlarged. The central pulmonary vascularity is prominent. The mediastinum is normal in width. The trachea is midline. The right-sided PICC line tip projects over the midportion of the SVC. The tracheostomy tube tip projects at the superior margin of the clavicular heads. The bony thorax exhibits no acute abnormality. IMPRESSION: Slight improved aeration of both lungs today. Small amounts of pleural fluid at both bases. Persistent atelectasis or pneumonia in the left lower lobe. Top-normal cardiac size with central pulmonary vascular congestion but no definite pulmonary edema. Electronically Signed   By: David  Martinique M.D.   On: 07/27/2017 10:55   Dg Chest Port 1 View  Result Date:  07/23/2017 CLINICAL DATA:  Acute on chronic respiratory failure. Airway aspiration. EXAM: PORTABLE CHEST 1 VIEW COMPARISON:  07/22/2017. FINDINGS: Interval enlargement of the cardiac silhouette. Stable dense left lower  lobe airspace opacity. Interval increased linear and patchy density at the right lung base. Stable small bilateral pleural effusions. Nasogastric tube extending into the stomach. Thoracic spine degenerative changes. IMPRESSION: 1. No significant change in dense left lower lobe atelectasis or pneumonia and small left pleural effusion. 2. Increased right basilar atelectasis or pneumonia with a stable small right pleural effusion. Electronically Signed   By: Claudie Revering M.D.   On: 07/23/2017 16:09   Dg Chest Port 1 View  Result Date: 07/22/2017 CLINICAL DATA:  Respiratory failure.  COPD. EXAM: PORTABLE CHEST 1 VIEW COMPARISON:  07/19/2017. FINDINGS: The cardiac silhouette remains borderline enlarged. Stable left pleural effusion and left basilar airspace opacity. Small right pleural effusion, decreased, with decreased adjacent right basilar atelectasis. Thoracic spine degenerative changes. IMPRESSION: 1. Stable left pleural effusion and left lower lobe atelectasis or pneumonia. 2. Decreased right pleural fluid and right basilar atelectasis. Electronically Signed   By: Claudie Revering M.D.   On: 07/22/2017 09:14   Dg Chest Port 1 View  Result Date: 07/19/2017 CLINICAL DATA:  Respiratory failure EXAM: PORTABLE CHEST 1 VIEW COMPARISON:  July 15, 2017 FINDINGS: There are small pleural effusions bilaterally. There is consolidation in the left lower lobe. Lungs elsewhere are clear. Heart size is upper normal with pulmonary vascularity within normal limits. No adenopathy. There is degenerative change in the thoracic spine. IMPRESSION: Left lower lobe consolidation with small pleural effusions bilaterally. There is mild bibasilar atelectasis. Stable cardiac silhouette. Electronically Signed   By: Lowella Grip III M.D.   On: 07/19/2017 07:57   Dg Chest Port 1 View  Result Date: 07/15/2017 CLINICAL DATA:  Acute respiratory failure. EXAM: PORTABLE CHEST 1 VIEW COMPARISON:  Chest x-ray from yesterday FINDINGS: The heart size and mediastinal contours are within normal limits. Normal pulmonary vascularity. Unchanged left greater than right bibasilar atelectasis and scarring at the costophrenic angles. No focal consolidation, pleural effusion, or pneumothorax. No acute osseous abnormality. IMPRESSION: Stable bibasilar atelectasis/scarring. Electronically Signed   By: Titus Dubin M.D.   On: 07/15/2017 10:41   Dg Chest Portable 1 View  Result Date: 07/14/2017 CLINICAL DATA:  Shortness of breath. EXAM: PORTABLE CHEST 1 VIEW COMPARISON:  06/05/2017. FINDINGS: Mediastinum and hilar structures normal. Stable mild cardiomegaly with normal pulmonary vascularity. Low lung volumes with mild basilar atelectasis. Stable bilateral pleural thickening consistent scarring. Degenerative changes thoracic spine. IMPRESSION: Low lung volumes with mild basilar atelectasis and/or pleuroparenchymal scarring again noted. Stable mild cardiomegaly. Chest is stable from prior exam. Electronically Signed   By: Marcello Moores  Register   On: 07/14/2017 17:13   Dg Abd Portable 1v  Result Date: 08/04/2017 CLINICAL DATA:  Nasogastric placement. EXAM: PORTABLE ABDOMEN - 1 VIEW COMPARISON:  07/31/2017 FINDINGS: Nasogastric tube enters the stomach, curves along the greater curvature and has its tip in the antrum. Moderate amount of intestinal gas is noted without obstructive pattern. IMPRESSION: Nasogastric tube tip in the gastric antrum. Electronically Signed   By: Nelson Chimes M.D.   On: 08/04/2017 19:23   [place] Ekg Site Rite  Result Date: 07/25/2017 If Site Rite image not attached, placement could not be confirmed due to current cardiac rhythm.    CBC Recent Labs  Lab 08/01/17 2342 08/02/17 0507 08/03/17 0539 08/04/17 0559  08/05/17 0754  WBC  --  13.4* 10.0 7.9 5.7  HGB 9.5* 9.2* 9.0* 8.2* 9.0*  HCT  --  28.7* 27.9* 26.1* 28.6*  PLT  --  322 268 257 270  MCV  --  90.9 91.7 91.4 91.6  MCH  --  29.2 29.7 28.8 28.9  MCHC  --  32.2 32.4 31.5* 31.5*  RDW  --  16.2* 15.7* 16.0* 15.7*  LYMPHSABS  --  0.7* 0.6* 0.9*  --   MONOABS  --  0.7 0.6 0.6  --   EOSABS  --  0.2 0.1 0.2  --   BASOSABS  --  0.0 0.0 0.0  --     Chemistries  Recent Labs  Lab 08/02/17 0507 08/03/17 0539 08/04/17 0559 08/05/17 0754 08/06/17 0515 08/07/17 0622  NA 139 139 141 136 138 139  K 3.7 3.7 3.4* 4.3 3.6 4.0  CL 99* 99* 99* 99* 97* 100*  CO2 32 33* 35* 30 33* 33*  GLUCOSE 113* 118* 87 99 99 115*  BUN 16 20 17 16 18  21*  CREATININE <0.30* 0.47 <0.30* <0.30* <0.30* <0.30*  CALCIUM 9.1 9.0 9.0 9.1 9.4 9.2  MG 1.5* 1.9 1.7 1.8  --  1.8   ------------------------------------------------------------------------------------------------------------------ CrCl cannot be calculated (This lab value cannot be used to calculate CrCl because it is not a number: <0.30). ------------------------------------------------------------------------------------------------------------------ No results for input(s): HGBA1C in the last 72 hours. ------------------------------------------------------------------------------------------------------------------ No results for input(s): CHOL, HDL, LDLCALC, TRIG, CHOLHDL, LDLDIRECT in the last 72 hours. ------------------------------------------------------------------------------------------------------------------ No results for input(s): TSH, T4TOTAL, T3FREE, THYROIDAB in the last 72 hours.  Invalid input(s): FREET3 ------------------------------------------------------------------------------------------------------------------ Recent Labs    08/07/17 0622 08/08/17 0508  FOLATE 13.3  --   FERRITIN  --  176    Coagulation profile No results for input(s): INR, PROTIME in the last 168  hours.  No results for input(s): DDIMER in the last 72 hours.  Cardiac Enzymes No results for input(s): CKMB, TROPONINI, MYOGLOBIN in the last 168 hours.  Invalid input(s): CK ------------------------------------------------------------------------------------------------------------------ Invalid input(s): POCBNP    Assessment & Plan   62 year old female patient with history of chronic respiratory failure, GI bleed currently in the hospital for acute on chronic hypercapnic respiratory failure and anemia secondary to slow GI bleed.  1.Acute on chronic hypercapnic respiratory failure Has tracheostomy on ventilator support,  weaning trials as tolerated as per ICU physician and respiratory therapist protocol . Off antibiotics  2.  Moderate malnutrition   Continue NG tube feeding, appreciate GI following Swallow evaluation deferred PEG tube placement plan as per ICU attending  3. Electrolyte imbalance F/u magnesium and potassium  4.  Acute on chronic gastrointestinal bleeding s/p EGD 3/18 showing Normal duodenal bulb and second portion of the duodenum.Duodenal lipoma. Non-bleeding gastric ulcer with a clean ulcer base (Forrest Class III).Normal gastroesophageal junction and esophagus. - protonix 40 mg via NGT bid - 5. DVT prophylaxis with SCD  6.  Constipation, patient started on stool softeners, patient had a big BM yesterday.  7. euthyroid sick syndrome.:  Continue Synthyroid.  8.  Hypokalemia resolved  9.  Physical therapy for endurance training  10. Palliative care consult.      Code Status Orders  (From admission, onward)        Start     Ordered   07/14/17 2115  Full code  Continuous     07/14/17 2122    Code Status History    Date Active Date Inactive Code Status Order ID Comments User Context   06/05/2017 1654 06/08/2017 1820 Full Code [id number]  Bettey Costa, MD Inpatient   11/01/2016 0121 11/09/2016 2044 Full Code [id number]  Lance Coon, MD  Inpatient      Time Spent in minutes  35  Greater than 50% of time spent in care coordination and counseling patient regarding the condition and plan of care.   Saundra Shelling M.D on 08/08/2017 at 1:01 PM  Between 7am to 6pm - Pager - [phone]  After 6pm go to [email or web address] - Proofreader  [employer]   Office  [phone]

## 2017-08-08 NOTE — Progress Notes (Signed)
  [facility] Progess Note  CC follow up resp failure  HPI Remains on vent, s/p trach Alert and awake Plan for G tube today   VENTILATOR SETTINGS: Vent Mode: PRVC FiO2 (%):  [40 %] 40 % Set Rate:  [15 bmp] 15 bmp Vt Set:  [450 mL] 450 mL PEEP:  [5 cmH20] 5 cmH20  INTAKE / OUTPUT:  Intake/Output Summary (Last 24 hours) at 08/08/2017 0440 Last data filed at 08/08/2017 0100 Gross per 24 hour  Intake 1247.92 ml  Output 865 ml  Net 382.92 ml    Name: [facility]la [facility] MRN: [id number] DOB: [birth year]    ADMISSION DATE:  07/14/2017   VITAL SIGNS: Temp:  [97.8 F (36.6 C)-98.9 F (37.2 C)] 97.8 F (36.6 C) (04/09 0000) Pulse Rate:  [96-122] 107 (04/09 0300) Resp:  [15-26] 20 (04/09 0300) BP: (89-118)/(63-90) 116/76 (04/09 0300) SpO2:  [85 %-100 %] 94 % (04/09 0300) FiO2 (%):  [40 %] 40 % (04/09 0342) Weight:  [137 lb 2 oz (62.2 kg)] 137 lb 2 oz (62.2 kg) (04/08 0600)  PHYSICAL EXAMINATION: Physical Examination:   VS: BP 116/76   Pulse (!) 107   Temp 97.8 F (36.6 C) (Oral)   Resp 20   Ht 5' 2.5" (1.588 m)   Wt 137 lb 2 oz (62.2 kg)   SpO2 94%   BMI 24.68 kg/m   General Appearance: No distress  Neuro:without focal findings, mental status normal. HEENT: tracheostomy in place on mechanical ventilation Pulmonary: relatively clear, right lower lobe posterior inspiratory crackles appreciated CardiovascularNormal S1,S2.  No m/r/g.   Abdomen: Soft positive bowel sounds. Skin:   warm, no rashes, no ecchymosis  Extremities: normal, no cyanosis, clubbing.    LABORATORY PANEL:   CBC Recent Labs  Lab 08/05/17 0754  WBC 5.7  HGB 9.0*  HCT 28.6*  PLT 270    Chemistries  Recent Labs  Lab 08/07/17 0622  NA 139  K 4.0  CL 100*  CO2 33*  GLUCOSE 115*  BUN 21*  CREATININE <0.30*  CALCIUM 9.2  MG 1.8  PHOS 4.0    Recent Labs  Lab 08/06/17 1747 08/06/17 2344 08/07/17 0611 08/07/17 1150 08/07/17 1834 08/07/17 2347  GLUCAP 93 110*  106* 114* 106* 35*      SYNOPSIS   Patient with underlying history of severe emphysema, hypertension, GI bleed, status post endoscopy, respiratory failure, pneumonia, tracheostomy on mechanical ventilation.  ASSESSMENT/PLAN   Respiratory failure. Patient is presently on mechanical ventilation, albuterol, Atrovent, Zosyn empirically. Patient with right lower lobe atelectasis/effusion/pneumonia on the right.  Presently on antibiotic therapy, will diurese as tolerated, will try to progress ventilator weaning as tolerated  GI bleed. Mild decrease in hemoglobin, no clear evidence of active bleeding at this time. Is on a PPI and has been seenogy.  G tube placement pending-swallow eval pending  Assess for LTACH    Clark Cuff Patricia Pesa, M.D.  [facility]  Medical Director [place] Director [facility] Cardio-Pulmonary Department

## 2017-08-08 NOTE — Progress Notes (Signed)
Kayla Darby, MD [address]  [place]  [place], [place] [address]  Main: [phone]  Fax: [phone] Pager: [phone]   Subjective: On trach vent, still requiring high pressures. Tolerating TFs well, having BM daily. Seen by speech and swallow services who did not recommend swallow evaluation to clear for PO diet due to ongoing need for mechanical vent support. Pt otherwise denies complaints, she was anxious when I saw her, vent was accidentally disconnected, nurse is bedside, vent reconnected and not in respiratory distress.   Objective: Vital signs in last 24 hours: Vitals:   08/08/17 1900 08/08/17 1958 08/08/17 2000 08/08/17 2100  BP: 116/62  120/86 120/84  Pulse: (!) 109  (!) 109 (!) 101  Resp: (!) 24  (!) 27 19  Temp: 98.6 F (37 C)     TempSrc: Oral     SpO2: 94% 94% 97% 95%  Weight:      Height:       Weight change: -14 lb 8.8 oz (-6.6 kg)  Intake/Output Summary (Last 24 hours) at 08/08/2017 2231 Last data filed at 08/08/2017 2000 Gross per 24 hour  Intake 1250.83 ml  Output 730 ml  Net 520.83 ml     Exam: Heart:: Tachycardia, regular rhythm  Lungs: clear to auscultation Abdomen: soft, nontender, normal bowel sounds   Lab Results: CBC Latest Ref Rng & Units 08/05/2017 08/04/2017 08/03/2017  WBC 3.6 - 11.0 K/uL 5.7 7.9 10.0  Hemoglobin 12.0 - 16.0 g/dL 9.0(L) 8.2(L) 9.0(L)  Hematocrit 35.0 - 47.0 % 28.6(L) 26.1(L) 27.9(L)  Platelets 150 - 440 K/uL 270 257 268   BMP Latest Ref Rng & Units 08/07/2017 08/06/2017 08/05/2017  Glucose 65 - 99 mg/dL 115(H) 99 99  BUN 6 - 20 mg/dL 21(H) 18 16  Creatinine 0.44 - 1.00 mg/dL <0.30(L) <0.30(L) <0.30(L)  Sodium 135 - 145 mmol/L 139 138 136  Potassium 3.5 - 5.1 mmol/L 4.0 3.6 4.3  Chloride 101 - 111 mmol/L 100(L) 97(L) 99(L)  CO2 22 - 32 mmol/L 33(H) 33(H) 30  Calcium 8.9 - 10.3 mg/dL 9.2 9.4 9.1   Hepatic Function Latest Ref Rng & Units 07/26/2017 07/19/2017 07/14/2017  Total Protein 6.5 - 8.1 g/dL - 5.7(L)  7.5  Albumin 3.5 - 5.0 g/dL 2.5(L) 2.5(L) 3.6  AST 15 - 41 U/L - 20 17  ALT 14 - 54 U/L - 14 13(L)  Alk Phosphatase 38 - 126 U/L - 40 48  Total Bilirubin 0.3 - 1.2 mg/dL - 1.0 0.5  Bilirubin, Direct 0.1 - 0.5 mg/dL - - 0.1    Micro Results: Recent Results (from the past 240 hour(s))  Culture, respiratory (NON-Expectorated)     Status: None   Collection Time: 08/02/17  6:23 PM  Result Value Ref Range Status   Specimen Description   Final    TRACHEAL ASPIRATE Performed at [facility], [address] [place], [place] [address]    Special Requests   Final    NONE Performed at [facility], [address] [place], [place] [address]    Gram Stain   Final    ABUNDANT WBC PRESENT, PREDOMINANTLY PMN FEW GRAM POSITIVE RODS    Culture   Final    Consistent with normal respiratory flora. Performed at [facility], [place] [address], [place], [place] [address]    Report Status 08/05/2017 FINAL  Final  Culture, respiratory (NON-Expectorated)     Status: None (Preliminary result)   Collection Time: 08/06/17  9:32  PM  Result Value Ref Range Status   Specimen Description   Final    INDUCED SPUTUM Performed at [facility], [address] [place], [place] [address]    Special Requests   Final    NONE Performed at [facility], [address] [place], [place] [address]    Gram Stain   Final    RARE WBC PRESENT, PREDOMINANTLY PMN NO ORGANISMS SEEN    Culture   Final    CULTURE REINCUBATED FOR BETTER GROWTH Performed at [facility], [place] [address] [place], [place] [address]    Report Status PENDING  Incomplete   Studies/Results: No results found. Medications: I have reviewed the patient's current medications. Scheduled Meds: . chlorhexidine  15 mL Mouth Rinse BID  . folic acid  1 mg Oral Daily  . free water  100 mL Per Tube Q6H  . ipratropium-albuterol  3 mL Nebulization TID  . liver oil-zinc oxide   Topical Daily  .  mouth rinse  15 mL Mouth Rinse QID  . mirtazapine  15 mg Per Tube QHS  . multivitamin  15 mL Per Tube Daily  . pantoprazole sodium  40 mg Per Tube BID  . senna-docusate  2 tablet Oral BID  . sodium chloride flush  10-40 mL Intracatheter Q12H  . thyroid  30 mg Per Tube QAC breakfast   Continuous Infusions: . feeding supplement (VITAL HIGH PROTEIN) 50 mL/hr at 08/08/17 0600   PRN Meds:.acetaminophen, ALPRAZolam, bisacodyl, metoprolol tartrate, [DISCONTINUED] ondansetron **OR** ondansetron (ZOFRAN) IV, simethicone, sodium chloride flush   Assessment: Principal Problem:   Major depressive disorder, single episode, severe without psychotic features ([facility]) Active Problems:   GI bleed   Chronic gastric ulcer without hemorrhage and without perforation   Acute respiratory failure (HCC)   Goals of care, counseling/discussion   Palliative care encounter   Acute on chronic respiratory failure with hypercapnia (HCC)   Protein-calorie malnutrition ([facility])   At high risk for aspiration   Palliative care by specialist   Dysphagia  Pt formally seen by speech and swallow services who recommended against formal swallow evaluation due to ongoing need for vent support with high pressures. They recommend G-tube placement to meet long term nutrition needs. Her prealbumin levels are normal. TFs at goal, tolerating them well. Pt does not have insurance, waiting to be transferred to [facility]  Plan: Plan for PEG placement on Thursday this week, will perform bedside in ICU if pt continues to be medically stable Hold TFs tomorrow after midnight Hold anticoagulation tomorrow night   LOS: 25 days   [facility] 08/08/2017, 10:31 PM

## 2017-08-08 NOTE — Progress Notes (Signed)
SLP Cancellation Note  Patient Details Name: Kayla Boyer MRN: [id number] DOB: 10-Feb-1956   Cancelled treatment:       Reason Eval/Treat Not Completed: Medical issues which prohibited therapy;Patient not medically ready(chart reviewed; consulted ICU MD and RT re: pt). Per MD/RT, pt just began weaning to spontaneous rate/support yesterday, however, pressure support remains increased d/t pt's anxiety(requiring medication) especially when she feels SOB. Pt has not been able to tolerate much weaning per RT. It is optimal for a pt to wean from vent support to trach collar b/f attempting oral intake. Tracheostomy was placed less than 2 weeks ago. Pt has not had much po by mouth while admitted in the ~3+ weeks d/t GI status(few clear liquids per chart notes); NG support only in the past ~2 weeks w/ malnutrition and severe deconditioning documented. Per notes, she has been wheelchair bound for ~3 years and with poor appetite and low functional status. Her interest in oral intake has been minimal per NSG documentation.  Due to her dx of Acute on chronic hypercapnic respiratory failure secondary to impaired ventilatory drive/muscle weakness and her overall deconditioned status, pt would not be an optimal candidate for the pressure of attempting to meet nutritional needs soley by mouth - in addition, her risk for Aspiration would be high d/t the physical demand of the task.  The above was discussed w/ ICU MD/RT who agreed w/ recommendation for planned PEG placement w/ consideration for oral intake again when pt's medical and respiratory status' are improved for safe participation in skilled dysphagia therapy in hopes to re-establish an oral diet(safely) for pt. Dietitian and NSG updated also. Recommend frequent oral care at this time to promote oral hygiene and stimulation for pharyngeal swallowing.    Orinda Kenner, MS, CCC-SLP [place] 08/08/2017, 10:06 AM

## 2017-08-09 ENCOUNTER — Inpatient Hospital Stay: Payer: Medicaid Other

## 2017-08-09 DIAGNOSIS — J9819 Other pulmonary collapse: Secondary | ICD-10-CM

## 2017-08-09 DIAGNOSIS — R5381 Other malaise: Secondary | ICD-10-CM

## 2017-08-09 DIAGNOSIS — J9811 Atelectasis: Secondary | ICD-10-CM

## 2017-08-09 LAB — GLUCOSE, CAPILLARY
GLUCOSE-CAPILLARY: 104 mg/dL — AB (ref 65–99)
GLUCOSE-CAPILLARY: 97 mg/dL (ref 65–99)
GLUCOSE-CAPILLARY: 107 mg/dL — AB (ref 65–99)
Glucose-Capillary: 100 mg/dL — ABNORMAL HIGH (ref 65–99)
Glucose-Capillary: 107 mg/dL — ABNORMAL HIGH (ref 65–99)

## 2017-08-09 LAB — CULTURE, RESPIRATORY

## 2017-08-09 LAB — CULTURE, RESPIRATORY W GRAM STAIN: Culture: NORMAL

## 2017-08-09 MED ORDER — ACETYLCYSTEINE 20 % IN SOLN
3.0000 mL | Freq: Three times a day (TID) | RESPIRATORY_TRACT | Status: AC
  Administered 2017-08-09 – 2017-08-11 (×5): 3 mL via RESPIRATORY_TRACT
  Administered 2017-08-11: 4 mL via RESPIRATORY_TRACT
  Administered 2017-08-11: 3 mL via RESPIRATORY_TRACT
  Administered 2017-08-11: 4 mL via RESPIRATORY_TRACT
  Filled 2017-08-09 (×8): qty 4

## 2017-08-09 MED ORDER — JEVITY 1.2 CAL PO LIQD
1000.0000 mL | ORAL | Status: DC
  Administered 2017-08-09 – 2017-08-13 (×3): 1000 mL
  Administered 2017-08-16: 50 mL/h

## 2017-08-09 MED ORDER — FOLIC ACID 1 MG PO TABS: 1.0000 mg | ORAL_TABLET | Freq: Every day | ORAL | Status: DC

## 2017-08-09 MED ORDER — ALBUTEROL SULFATE (2.5 MG/3ML) 0.083% IN NEBU
2.5000 mg | INHALATION_SOLUTION | Freq: Three times a day (TID) | RESPIRATORY_TRACT | Status: DC
  Administered 2017-08-09 – 2017-08-14 (×15): 2.5 mg via RESPIRATORY_TRACT
  Filled 2017-08-09 (×15): qty 3

## 2017-08-09 MED ORDER — PRO-STAT SUGAR FREE PO LIQD
30.0000 mL | Freq: Two times a day (BID) | ORAL | Status: DC
  Administered 2017-08-09 – 2017-08-11 (×4): 30 mL

## 2017-08-09 NOTE — Clinical Social Work Note (Signed)
CSW received call from Santiago Glad with [facility] (used to be [facility] at [facility]) and Santiago Glad stated that they will look at patient's referral. She stated that patient not having any insurance right now may be an issue but that they assist the family in applying for [place]. Santiago Glad stated that they have 3 openings for vent/snf patients but they will have to review patient's information and get back with me. Shela Leff MSW,LCSW [phone]

## 2017-08-09 NOTE — Clinical Social Work Note (Signed)
CSW did not hear back from patient's son so CSW contacted him again this morning via phone. Patient's son was available and CSW spent time discussing with him the progression of care for his mother. CSW explained that patient would not be eligible to go to an LTAC facility and explained why. CSW further explained that patient would be eligible for a vent/snf facility and named the facilities that patient would be eligible for. CSW informed patient's son that we had heard back from [facility] in [place] and they do not accept medicaid pending patients. CSW assisted with helping patient's son process the information he was being provided and gave contact information so that he could call CSW with any further questions. Patient's son asked appropriate questions and does want to know more about what would be involved if it was feasible for the family to take her home at discharge. CSW stated that the RN CM would be asked to contact him regarding potential resources for home. CSW also informed patient's son that most of the vent/snf facilities have waiting lists so any transition to a vent/snf will not be a quick transition. Patient's son verbalized understanding. Shela Leff MSW,LCSW [phone]

## 2017-08-09 NOTE — Progress Notes (Signed)
Patient returned to previous mode of PRVC 15, 450, +5 Peep, and 40% due to increased WOB.

## 2017-08-09 NOTE — Clinical Social Work Note (Signed)
CSW has calls into vent/snf facilities for follow up regarding referrals sent yesterday. Shela Leff MSW,LCSW [phone]

## 2017-08-09 NOTE — Progress Notes (Signed)
Nutrition Follow-up  DOCUMENTATION CODES:   Not applicable  INTERVENTION:  Initiate new goal TF regimen of Jevity 1.2 at 50 mL/hr (1200 mL goal daily volume) + Pro-Stat 30 mL BID via NGT. Provides 1640 kcal, 97 grams of protein, 22 grams of fiber, 972 mL H2O daily.  Utilize PEPuP protocol when tube feeds are held.  With free water flush of 100 mL Q6hrs patient is receiving a total of 1372 mL H2O daily, which is within estimated fluid needs.  Will discontinue liquid MVI as new goal regimen meets 100% RDIs for vitamins/minerals.  NUTRITION DIAGNOSIS:   Increased nutrient needs related to catabolic illness(acute exacerbation of COPD) as evidenced by estimated needs.  Ongoing.  GOAL:   Patient will meet greater than or equal to 90% of their needs  Met with TF regimen.  MONITOR:   PO intake, Labs, Weight trends, TF tolerance, I & O's  REASON FOR ASSESSMENT:   Malnutrition Screening Tool, Consult Enteral/tube feeding initiation and management  ASSESSMENT:   62 year old female with PMHx of HLD, GI bleed, emphysema of lung, COPD who was admitted 3/15 with acute blood loss anemia, acute encephalopathy, and acute on chronic hypercapnic respiratory failure secondary to acute exacerbation of COPD requiring BiPAP. On 3/17 she was able to transfer to [facility] unit. She underwent EGD on 3/18 and a rapid response was called due to respiratory failure likely due to sedating medications once again requiring BiPAP.   -Plan is for bedside PEG tube placement on 4/11.  Met with patient at bedside. She denies any N/V or abdominal pain. Appears to be having bowel movements about every other day per review of chart. Abdomen soft today.   As patient has been here 26 days now, reassessed NFPE today. Patient has been on ventilator since 3/28. No indirect calorimetry available at this facility, but RD concerned PSU 2003b is not predicting adequately calorie needs for patient. Will switch to  weight-based equation.  Access: NGT placed 4/5; terminates in gastric antrum per abdominal x-ray 4/5; 65 cm at right nare  TF: pt tolerating VHP at 50 mL/hr with FWF of 100 mL Q6hrs; per pump history patient received 1058 mL tube feeds in the past 24 hrs (88.2% goal daily volume)  Patient is currently intubated on ventilator support MV: 7.8 L/min Temp (24hrs), Avg:98.4 F (36.9 C), Min:97.8 F (36.6 C), Max:98.6 F (37 C)  Propofol: N/A  Medications reviewed and include: folic acid 1 mg daily, free water flush 100 mL Q6hrs, mirtazapine, liquid MVI daily, Protonix, senna-docusate, Armour daily.  Labs reviewed: CBG 97-107 past 24 hrs. On 4/8 Chloride 100, CO2 33, BUN 21, Creatinine <0.3, Folate 13.3 (WNL), Ferritin 176 (WNL).  I/O: 1300 mL UOP yesterday (1 mL/kg/hr)  Weight trend: 52.7 kg on 4/10; -7.2 kg (12% body weight) over about one month, which is significant for time frame  Discussed with RN and on rounds.  NUTRITION - FOCUSED PHYSICAL EXAM:    Most Recent Value  Orbital Region  No depletion  Upper Arm Region  No depletion  Thoracic and Lumbar Region  No depletion  Buccal Region  No depletion  Temple Region  Mild depletion  Clavicle Bone Region  Severe depletion  Clavicle and Acromion Bone Region  Severe depletion  Scapular Bone Region  Unable to assess  Dorsal Hand  Severe depletion  Patellar Region  Severe depletion  Anterior Thigh Region  Severe depletion  Posterior Calf Region  Severe depletion  Edema (RD Assessment)  None  Hair  Reviewed  Eyes  Reviewed  Mouth  Reviewed  Skin  Reviewed  Nails  Reviewed     Diet Order:  No diet orders on file  EDUCATION NEEDS:   Not appropriate for education at this time  Skin:  Skin Assessment: Skin Integrity Issues: Skin Integrity Issues:: Incisions, Stage II, Other (Comment) Stage II: coccyx Incisions: closed incision to neck Other: MSAD to sacrum; scattered ecchymosis  Last BM:  08/07/2017 - large type  6  Height:   Ht Readings from Last 1 Encounters:  07/14/17 5' 2.5" (1.588 m)    Weight:   Wt Readings from Last 1 Encounters:  08/09/17 116 lb 2.9 oz (52.7 kg)    Ideal Body Weight:  51.1 kg  BMI:  Body mass index is 20.91 kg/m.  Estimated Nutritional Needs:   Kcal:  1320-1600 (25-30 kcal/kg)  Protein:  80-95 grams (1.5-1.8 grams/kg)  Fluid:  1.3-1.6 L/day (25-30 mL/kg)  Willey Blade, MS, RD, LDN Office: [phone] Pager: [phone] After Hours/Weekend Pager: [phone]

## 2017-08-09 NOTE — Progress Notes (Signed)
Physical Therapy Treatment Patient Details Name: Kayla Boyer MRN: [id number] DOB: 06-19-1955 Today's Date: 08/09/2017    History of Present Illness Kayla Boyer  is a 62 y.o. female with a known history of gastrointestinal bleeding, gastric ulcer, hyperlipidemia, emphysema of lung was referred by ER physician Dr.malinda for weakness and dark stools.  Patient presented to the emergency room with generalized weakness.  She has noticed a dark black stools for the last few days she was recently in our hospital last month and had an endoscopy which showed gastric ulcers she was discharged on oral proton pump inhibitors.  Patient is lethargic and weak.  Has nausea and vomitings.  No complaints of any chest pain, abdominal pain.  No fever and chills.Hemoglobin dropped from 11.7 to 9.7. Stool for occult blood is positive.  Diagnosis of GI bleed and then developed acute resp distress and placed on BIPAP and then required a ventilator. She is currently vent dependent and did not tolerate weaning over the weekend to a lower setting. NG tube in place and NPO. Plan for PEG tube tomorrow. Pt remains on full vent support.    PT Comments    Pt is making good progress towards goals able to sit at EOB with sats at 93%, on full vent support. Plans for respiratory to adjust to pressure support again this date. Discussed POC with RN about multi podus boots and benefits of ROM for ankles. RN is planning to order and don on B feet keeping an eye on skin integrity. Repositioned R hip as it falls out to side in ER, positioned with pillows and made pt aware. Would plan to progress treatment by using [name] Lift next session to attempt standing per respiratory status. RN in agreement. Will continue to progress.   Follow Up Recommendations  (vent SN[facility])     Equipment Recommendations       Recommendations for Other Services       Precautions / Restrictions Precautions Precautions: Fall Restrictions Weight Bearing  Restrictions: No RLE Weight Bearing: Non weight bearing LLE Weight Bearing: Non weight bearing    Mobility  Bed Mobility Overal bed mobility: Needs Assistance Bed Mobility: Supine to Sit     Supine to sit: Max assist;+2 for physical assistance     General bed mobility comments: Needs max assist +2 for sitting at EOB. Able to pull with R arm on rail. Once seated LOB in R direction, needs min/mod assist to fully sit upright and then able to maintain posture and balance with supervision. Does lose balance with dual tasking while seated at EOB.  Transfers                 General transfer comment: unable to perform  Ambulation/Gait                 Stairs            Wheelchair Mobility    Modified Rankin (Stroke Patients Only)       Balance                                            Cognition Arousal/Alertness: Awake/alert Behavior During Therapy: WFL for tasks assessed/performed Overall Cognitive Status: Within Functional Limits for tasks assessed  Exercises Other Exercises Other Exercises: supine ther-ex performed on B LE including SLRs, hip add squeezes, SAQ, seated LAQ, and PROM ankle pumps. Of note also performed gastroc stretching 3 x 30 seconds. All ther-ex performed x 15 reps. Educated on frequency and duration of HEP and is written on board. Other Exercises: L ankle PF at 38 degrees; R ankle PF at 22 degrees    General Comments        Pertinent Vitals/Pain Pain Assessment: No/denies pain    Home Living                      Prior Function            PT Goals (current goals can now be found in the care plan section) Acute Rehab PT Goals Patient Stated Goal: "to get stronger and take care of myself" PT Goal Formulation: With patient Time For Goal Achievement: 08/14/17 Potential to Achieve Goals: Good Progress towards PT goals: Progressing toward  goals    Frequency    Min 2X/week      PT Plan Current plan remains appropriate    Co-evaluation              AM-PAC PT "6 Clicks" Daily Activity  Outcome Measure  Difficulty turning over in bed (including adjusting bedclothes, sheets and blankets)?: Unable Difficulty moving from lying on back to sitting on the side of the bed? : Unable Difficulty sitting down on and standing up from a chair with arms (e.g., wheelchair, bedside commode, etc,.)?: Unable Help needed moving to and from a bed to chair (including a wheelchair)?: Total Help needed walking in hospital room?: Total Help needed climbing 3-5 steps with a railing? : Total 6 Click Score: 6    End of Session Equipment Utilized During Treatment: (vent) Activity Tolerance: Patient tolerated treatment well Patient left: in bed;with bed alarm set Nurse Communication: Mobility status PT Visit Diagnosis: Muscle weakness (generalized) (M62.81);Difficulty in walking, not elsewhere classified (R26.2)     Time: 8182-9937 PT Time Calculation (min) (ACUTE ONLY): 32 min  Charges:  $Therapeutic Exercise: 23-37 mins                    G Codes:       Greggory Stallion, PT, DPT [phone]    Lahela Woodin 08/09/2017, 11:38 AM

## 2017-08-09 NOTE — Progress Notes (Signed)
[place] at [facility]                                                                                                                                                                                  Patient Demographics   Kayla Boyer, is a 62 y.o. female, DOB - [birth year], GBT:[id number]  Admit date - 07/14/2017   Admitting Physician Saundra Shelling, MD  Outpatient Primary MD for the patient is Patient, [name]   LOS - 26  Subjective: Patient seen and evaluated today On tracheostomy with vent support requiring pressure support Communicates by writing on the pad and on verbal commands No complaints of pain Has a NG tube feeding   Review of Systems:   CONSTITUTIONAL: No documented fever. No fatigue, weakness. No weight gain, no weight loss.  EYES: No blurry or double vision.  ENT: No tinnitus. No postnasal drip. No redness of the oropharynx.  Has tracheostomy RESPIRATORY: No cough, no wheeze, no hemoptysis. No dyspnea.  CARDIOVASCULAR: No chest pain. No orthopnea. No palpitations. No syncope.  GASTROINTESTINAL: No nausea, no vomiting or diarrhea. No abdominal pain. No melena or hematochezia. GENITOURINARY: No dysuria or hematuria.  ENDOCRINE: No polyuria or nocturia. No heat or cold intolerance.  HEMATOLOGY: No anemia. No bruising. No bleeding.  INTEGUMENTARY: No rashes. No lesions.  MUSCULOSKELETAL: No arthritis. No swelling. No gout.  NEUROLOGIC: No numbness, tingling, or ataxia. No seizure-type activity.  PSYCHIATRIC: No anxiety. No insomnia. No ADD.    Vitals:   Vitals:   08/09/17 0809 08/09/17 0900 08/09/17 1000 08/09/17 1100  BP:  101/73 (!) 111/56 109/77  Pulse:  95 95 (!) 101  Resp:  16 15 (!) 23  Temp:      TempSrc:      SpO2: 99% 100% 98% 99%  Weight:      Height:        Wt Readings from Last 3 Encounters:  08/09/17 52.7 kg (116 lb 2.9 oz)  07/05/17 60.8 kg (134 lb)  07/04/17 60.8 kg (134 lb)     Intake/Output Summary  (Last 24 hours) at 08/09/2017 1323 Last data filed at 08/09/2017 1100 Gross per 24 hour  Intake 1100 ml  Output 1300 ml  Net -200 ml    Physical Exam:   GENERAL: Pleasant-appearing female patient lying on the bed with ventilator support via tracheostomy HEAD, EYES, EARS, NOSE AND THROAT: Atraumatic, normocephalic. Extraocular muscles are intact. Pupils equal and reactive to light. Sclerae anicteric. No conjunctival injection. No oro-pharyngeal erythema.  NECK: Supple. There is no jugular venous distention. No bruits, no lymphadenopathy, no thyromegaly.  Has tracheostomy, Has NGT for feeds HEART: Regular rate and  rhythm,. No murmurs, no rubs, no clicks.  LUNGS: Clear to auscultation bilaterally. No rales or rhonchi. No wheezes.  Has tracheostomy with ventilator support Tidal volume 450 rate 15 PEEP 5 FiO2 30% ABDOMEN: Soft, flat, nontender, nondistended. Has good bowel sounds. No hepatosplenomegaly appreciated.  EXTREMITIES: No evidence of any cyanosis, clubbing, or peripheral edema.  +2 pedal and radial pulses bilaterally.  NEUROLOGIC: The patient is alert, awake, and oriented x3 with no focal motor or sensory deficits appreciated bilaterally.  SKIN: Moist and warm with no rashes appreciated.  Psych: Not anxious, depressed LN: No inguinal LN enlargement    Antibiotics   Anti-infectives (From admission, onward)   Start     Dose/Rate Route Frequency Ordered Stop   08/04/17 2200  ceFEPIme (MAXIPIME) 1 g in sodium chloride 0.9 % 100 mL IVPB  Status:  Discontinued     1 g 200 mL/hr over 30 Minutes Intravenous Every 8 hours 08/04/17 1218 08/04/17 1448   08/04/17 2200  ceFEPIme (MAXIPIME) 1 g in sodium chloride 0.9 % 100 mL IVPB  Status:  Discontinued     1 g 200 mL/hr over 30 Minutes Intravenous Every 8 hours 08/04/17 1448 08/07/17 1119   08/03/17 0100  piperacillin-tazobactam (ZOSYN) IVPB 3.375 g  Status:  Discontinued     3.375 g 12.5 mL/hr over 240 Minutes Intravenous Every 8 hours  08/02/17 1701 08/02/17 1704   08/02/17 1730  piperacillin-tazobactam (ZOSYN) IVPB 3.375 g  Status:  Discontinued     3.375 g 12.5 mL/hr over 240 Minutes Intravenous Every 8 hours 08/02/17 1704 08/04/17 1217   08/02/17 1645  piperacillin-tazobactam (ZOSYN) IVPB 3.375 g  Status:  Discontinued     3.375 g 12.5 mL/hr over 240 Minutes Intravenous Every 8 hours 08/02/17 1639 08/02/17 1701   07/14/17 2200  azithromycin (ZITHROMAX) 500 mg in sodium chloride 0.9 % 250 mL IVPB  Status:  Discontinued     500 mg 250 mL/hr over 60 Minutes Intravenous Every 24 hours 07/14/17 2122 07/18/17 0915      Medications   Scheduled Meds: . acetylcysteine  3 mL Nebulization TID  . albuterol  2.5 mg Nebulization Q8H  . chlorhexidine  15 mL Mouth Rinse BID  . [START ON 08/08/8117] folic acid  1 mg Per Tube Daily  . free water  100 mL Per Tube Q6H  . liver oil-zinc oxide   Topical Daily  . mouth rinse  15 mL Mouth Rinse QID  . mirtazapine  15 mg Per Tube QHS  . multivitamin  15 mL Per Tube Daily  . pantoprazole sodium  40 mg Per Tube BID  . senna-docusate  2 tablet Oral BID  . sodium chloride flush  10-40 mL Intracatheter Q12H  . thyroid  30 mg Per Tube QAC breakfast   Continuous Infusions: . feeding supplement (VITAL HIGH PROTEIN) 50 mL/hr at 08/09/17 0600   PRN Meds:.acetaminophen, ALPRAZolam, bisacodyl, [DISCONTINUED] ondansetron **OR** ondansetron (ZOFRAN) IV, simethicone, sodium chloride flush   Data Review:   Micro Results Recent Results (from the past 240 hour(s))  Culture, respiratory (NON-Expectorated)     Status: None   Collection Time: 08/02/17  6:23 PM  Result Value Ref Range Status   Specimen Description   Final    TRACHEAL ASPIRATE Performed at [facility], [address], [place], [place] [address]    Special Requests   Final    NONE Performed at [facility], [address] [place], [place] [address]    Gram Stain  Final    ABUNDANT WBC PRESENT,  PREDOMINANTLY PMN FEW GRAM POSITIVE RODS    Culture   Final    Consistent with normal respiratory flora. Performed at [facility], [place] [address], [place], [place] [address]    Report Status 08/05/2017 FINAL  Final  Culture, respiratory (NON-Expectorated)     Status: None   Collection Time: 08/06/17  9:32 PM  Result Value Ref Range Status   Specimen Description   Final    INDUCED SPUTUM Performed at [facility], [address], [place], [place] [address]    Special Requests   Final    NONE Performed at [facility], [address] [place], [place] [address]    Gram Stain   Final    RARE WBC PRESENT, PREDOMINANTLY PMN NO ORGANISMS SEEN    Culture   Final    RARE Consistent with normal respiratory flora. Performed at [facility], [place] [address], [place], [place] [address]    Report Status 08/09/2017 FINAL  Final    Radiology Reports Dg Abd 1 View  Result Date: 07/31/2017 CLINICAL DATA:  Nasogastric tube placement. EXAM: ABDOMEN - 1 VIEW COMPARISON:  Radiograph July 22, 2017. FINDINGS: The bowel gas pattern is normal. Distal tip of feeding tube is seen in distal stomach. No radio-opaque calculi or other significant radiographic abnormality are seen. IMPRESSION: Distal tip of nasogastric tube seen in distal stomach. No evidence of bowel obstruction or ileus. Electronically Signed   By: Marijo Conception, M.D.   On: 07/31/2017 11:47   Dg Abd 1 View  Result Date: 07/22/2017 CLINICAL DATA:  Encounter for feeding tube placement. EXAM: ABDOMEN - 1 VIEW COMPARISON:  Chest radiograph 07/22/2017 FINDINGS: A nasogastric tube has been advanced into the abdomen. Catheter tip is in the distal stomach region. Bowel gas throughout the abdomen. Persistent densities at the left lung base and the left hemidiaphragm remains obscured. IMPRESSION: Feeding tube tip in the distal stomach region. Electronically Signed   By: Markus Daft M.D.   On: 07/22/2017 16:30   Ct  Head Wo Contrast  Result Date: 07/23/2017 CLINICAL DATA:  62 y/o F; changes to left pupil in strength of left arm. EXAM: CT HEAD WITHOUT CONTRAST TECHNIQUE: Contiguous axial images were obtained from the base of the skull through the vertex without intravenous contrast. COMPARISON:  None. FINDINGS: Brain: No evidence of acute infarction, hemorrhage, hydrocephalus, extra-axial collection or mass lesion/mass effect. Vascular: Calcific atherosclerosis of carotid siphons. No hyperdense vessel. Skull: Normal. Negative for fracture or focal lesion. Sinuses/Orbits: No acute finding. Other: None. IMPRESSION: Negative CT of the head. Electronically Signed   By: Kristine Garbe M.D.   On: 07/23/2017 14:33   Nm Gi Blood Loss  Result Date: 07/20/2017 CLINICAL DATA:  GI bleeding EXAM: NUCLEAR MEDICINE GASTROINTESTINAL BLEEDING SCAN TECHNIQUE: Sequential abdominal images were obtained following intravenous administration of Tc-42m labeled red blood cells. RADIOPHARMACEUTICALS:  Twenty mCi Tc-24m pertechnetate in-vitro labeled red cells. COMPARISON:  None. FINDINGS: Normal distribution of radiotracer is noted. No focal area of tracer accumulation is identified to suggest active GI bleeding is noted. IMPRESSION: No findings to suggest active GI hemorrhage. Electronically Signed   By: Inez Catalina M.D.   On: 07/20/2017 15:16   US Venous Img Upper Bilat  Result Date: 07/16/2017 CLINICAL DATA:  Bilateral upper extremity edema acutely with pain and discomfort for 1 day EXAM: BILATERAL UPPER EXTREMITY VENOUS DOPPLER ULTRASOUND TECHNIQUE: Gray-scale sonography with graded compression, as well  as color Doppler and duplex ultrasound were performed to evaluate the bilateral upper extremity deep venous systems from the level of the subclavian vein and including the jugular, axillary, basilic, radial, ulnar and upper cephalic vein. Spectral Doppler was utilized to evaluate flow at rest and with distal augmentation  maneuvers. COMPARISON:  None. FINDINGS: Exam is limited because of upper extremity subcutaneous edema. RIGHT UPPER EXTREMITY Internal Jugular Vein: No evidence of thrombus. Normal compressibility, respiratory phasicity and response to augmentation. Subclavian Vein: No evidence of thrombus. Normal compressibility, respiratory phasicity and response to augmentation. Axillary Vein: No evidence of thrombus. Normal compressibility, respiratory phasicity and response to augmentation. Cephalic Vein: No evidence of thrombus. Normal compressibility, respiratory phasicity and response to augmentation. Basilic Vein: Not well visualized to accurately assess. Brachial Veins: No evidence of thrombus. Normal compressibility, respiratory phasicity and response to augmentation. Radial Veins: No evidence of thrombus. Normal compressibility, respiratory phasicity and response to augmentation. Ulnar Veins: No evidence of thrombus. Normal compressibility, respiratory phasicity and response to augmentation. Venous Reflux:  None. Other Findings:  Peripheral edema noted LEFT UPPER EXTREMITY Internal Jugular Vein: No evidence of thrombus. Normal compressibility, respiratory phasicity and response to augmentation. Subclavian Vein: No evidence of thrombus. Normal compressibility, respiratory phasicity and response to augmentation. Axillary Vein: No evidence of thrombus. Normal compressibility, respiratory phasicity and response to augmentation. Cephalic Vein: No evidence of thrombus. Normal compressibility, respiratory phasicity and response to augmentation. Basilic Vein: No evidence of thrombus. Normal compressibility, respiratory phasicity and response to augmentation. Brachial Veins: No evidence of thrombus. Normal compressibility, respiratory phasicity and response to augmentation. Radial Veins: No evidence of thrombus. Normal compressibility, respiratory phasicity and response to augmentation. Ulnar Veins: No evidence of thrombus. Normal  compressibility, respiratory phasicity and response to augmentation. Venous Reflux:  None. Other Findings:  But peripheral subcutaneous edema noted. IMPRESSION: Limited exam but no gross occlusive upper extremity DVT in either arm. Electronically Signed   By: Jerilynn Mages.  Shick M.D.   On: 07/16/2017 12:34   US Venous Img Upper Uni Left  Result Date: 07/25/2017 CLINICAL DATA:  Left upper extremity edema. EXAM: LEFT UPPER EXTREMITY VENOUS DOPPLER ULTRASOUND TECHNIQUE: Gray-scale sonography with graded compression, as well as color Doppler and duplex ultrasound were performed to evaluate the upper extremity deep venous system from the level of the subclavian vein and including the jugular, axillary, basilic, radial, ulnar and upper cephalic vein. Spectral Doppler was utilized to evaluate flow at rest and with distal augmentation maneuvers. COMPARISON:  None. FINDINGS: Contralateral Subclavian Vein: Respiratory phasicity is normal and symmetric with the symptomatic side. No evidence of thrombus. Normal compressibility. Internal Jugular Vein: No evidence of thrombus. Normal compressibility, respiratory phasicity and response to augmentation. Subclavian Vein: No evidence of thrombus. Normal compressibility, respiratory phasicity and response to augmentation. Axillary Vein: No evidence of thrombus. Normal compressibility, respiratory phasicity and response to augmentation. Cephalic Vein: No evidence of thrombus. Normal compressibility, respiratory phasicity and response to augmentation. Basilic Vein: No evidence of thrombus. Normal compressibility, respiratory phasicity and response to augmentation. Brachial Veins: No evidence of thrombus. Normal compressibility, respiratory phasicity and response to augmentation. Radial Veins: No evidence of thrombus. Normal compressibility, respiratory phasicity and response to augmentation. Ulnar Veins: No evidence of thrombus. Normal compressibility, respiratory phasicity and response to  augmentation. Venous Reflux:  None visualized. Other Findings: There is significant edema noted in the left upper arm including some more locally marginated fluid in the subcutaneous tissues of the left medial upper arm. This fluid appears to extend over a  long distance and likely represents subcutaneous fluid which is more prominent in 1 area of the upper arm. If there is any concern for soft tissue infection, additional imaging may be warranted. IMPRESSION: 1. No evidence of left upper extremity deep venous thrombosis. 2. Significant subcutaneous fluid in the left upper arm with an area of more marginated subcutaneous fluid also identified in the medial left upper arm. Although this does not have the appearance of a focal abscess by ultrasound, if there is concern for upper extremity infection, additional imaging may be warranted with CT and/or MRI. Electronically Signed   By: Aletta Edouard M.D.   On: 07/25/2017 17:48   Dg Chest Port 1 View  Result Date: 08/09/2017 CLINICAL DATA:  Respiratory failure EXAM: PORTABLE CHEST 1 VIEW COMPARISON:  Portable chest x-ray of August 06, 2017 FINDINGS: There has been markedly increased volume loss on the right due to increasing pleural fluid. Only approximately 1/4 of the pleural space volume is occupied by aerated lung. There is mild shift of the mediastinum toward the right which is stable. The left lung is mildly hyperinflated. There is a tiny left pleural effusion. The left heart border is normal. The pulmonary vascularity is not engorged. The tracheostomy tube tip lies just above the superior margin of the clavicles. The esophagogastric tube tip in proximal port project below the GE junction. The right PICC line tip projects over the midportion of the SVC. IMPRESSION: Markedly increased volume loss on the right consistent with increasing pleural fluid volume. Electronically Signed   By: David  Martinique M.D.   On: 08/09/2017 12:38   Dg Chest Port 1 View  Result Date:  08/06/2017 CLINICAL DATA:  Chronic ventilator dependent respiratory failure. Follow-up RIGHT lower lobe atelectasis and/or pneumonia. EXAM: PORTABLE CHEST 1 VIEW COMPARISON:  08/05/2017, 08/04/2017 and earlier. FINDINGS: Tracheostomy tube tip in satisfactory position projecting below the thoracic inlet. RIGHT arm PICC tip projects over the LOWER SVC. Nasogastric tube courses below the diaphragm into the stomach. Cardiac silhouette upper normal in size for AP portable technique, unchanged. Dense airspace consolidation in the RIGHT lower lobe, unchanged, possibly associated with a RIGHT pleural effusion. LEFT lung clear, without evidence of recurrent LEFT lower lobe atelectasis as noted on the examination 2 days ago. No new pulmonary parenchymal abnormalities in either lung. IMPRESSION: 1.  Support apparatus satisfactory. 2. Stable dense RIGHT lower lobe atelectasis and/or pneumonia and possible associated RIGHT pleural effusion. 3. No new abnormalities. Electronically Signed   By: Evangeline Dakin M.D.   On: 08/06/2017 08:43   Dg Chest Port 1 View  Result Date: 08/05/2017 CLINICAL DATA:  Acute respiratory failure EXAM: PORTABLE CHEST 1 VIEW COMPARISON:  08/04/2017 FINDINGS: Tracheostomy in good position. Right arm PICC tip in the mid SVC. NG tube in the stomach. Progression of right lower lobe atelectasis/infiltrate. Mild left lower lobe atelectasis slightly improved. Small right effusion. Negative for edema. IMPRESSION: Mild progression of right lower lobe airspace disease. Mild improvement in left lower lobe atelectasis. Electronically Signed   By: Franchot Gallo M.D.   On: 08/05/2017 07:33   Dg Chest Port 1 View  Result Date: 08/04/2017 CLINICAL DATA:  Pneumonia EXAM: PORTABLE CHEST 1 VIEW COMPARISON:  08/03/2017, 08/02/2017, 07/30/2017 FINDINGS: Tracheostomy tube is in place. Worsening airspace disease at the right lung base. No change in dense left lung base consolidation. Probable small effusions. Stable  enlarged cardiomediastinal silhouette. IMPRESSION: 1. Worsening airspace disease at the right lung base may reflect worsening atelectasis or pneumonia 2. No  change in dense left lower lobe atelectasis or pneumonia. Probable small pleural effusions. Electronically Signed   By: Donavan Foil M.D.   On: 08/04/2017 03:58   Dg Chest Port 1 View  Result Date: 08/03/2017 CLINICAL DATA:  Pneumonia EXAM: PORTABLE CHEST 1 VIEW COMPARISON:  08/02/2017 FINDINGS: Tracheostomy in good position. Right arm PICC tip in the SVC. NG tube in the stomach. Bibasilar airspace disease unchanged. Small left effusion. Negative for edema. IMPRESSION: Bibasilar atelectasis/infiltrate unchanged. Support lines remain in good position. Electronically Signed   By: Franchot Gallo M.D.   On: 08/03/2017 07:14   Dg Chest Port 1 View  Result Date: 08/02/2017 CLINICAL DATA:  Shortness of Breath EXAM: PORTABLE CHEST 1 VIEW COMPARISON:  August 01, 2017 FINDINGS: Tracheostomy catheter tip is 5.0 cm above the carina. Central catheter tip is in the superior vena cava. Nasogastric tube tip and side port are below the diaphragm. No pneumothorax. There is airspace consolidation left lower lobe with small left pleural effusion. There is mild right base atelectasis. There is cardiomegaly with pulmonary vascularity within normal limits. No adenopathy. No bone lesions. IMPRESSION: Tube and catheter positions as described without evident pneumothorax. Airspace consolidation consistent with pneumonia left lower lobe with small left pleural effusion. Mild right base atelectasis. Stable cardiac prominence. Electronically Signed   By: Lowella Grip III M.D.   On: 08/02/2017 07:55   Dg Chest Port 1 View  Result Date: 08/01/2017 CLINICAL DATA:  Pneumonia EXAM: PORTABLE CHEST 1 VIEW COMPARISON:  07/30/2017, 07/29/2017, 07/27/2017, 07/23/2017 FINDINGS: Tracheostomy tube remains in place. Esophageal tube tip is below the diaphragm but non included. Interval  opacification of the right diaphragm. No change in dense left lung base consolidation. Probable tiny pleural effusions. Enlarged cardiomediastinal silhouette is unchanged. Right upper extremity catheter tip overlies the SVC. IMPRESSION: 1. Increased opacity at the right base may reflect atelectasis 2. No significant change in probable small left effusion and dense left lung base consolidation, atelectasis versus pneumonia. 3. Stable cardiomegaly Electronically Signed   By: Donavan Foil M.D.   On: 08/01/2017 02:56   Dg Chest Port 1 View  Result Date: 07/30/2017 CLINICAL DATA:  Pneumonia EXAM: PORTABLE CHEST 1 VIEW COMPARISON:  07/29/2017 and prior radiographs FINDINGS: A tracheostomy tube, RIGHT PICC line with tip overlying the mid-LOWER SVC and endotracheal tube with tip overlying the distal esophagus again noted. Decreased LEFT LOWER lung consolidation/atelectasis noted. Mild RIGHT basilar atelectasis again noted. There is no evidence of pneumothorax. No other interval changes identified. IMPRESSION: 1. Decreased LEFT LOWER lung consolidation/atelectasis. 2. NG tube with tip overlying the distal esophagus-recommend advancement. Electronically Signed   By: Margarette Canada M.D.   On: 07/30/2017 07:49   Dg Chest Port 1 View  Result Date: 07/29/2017 CLINICAL DATA:  Acute on chronic respiratory failure. EXAM: PORTABLE CHEST 1 VIEW COMPARISON:  Radiograph July 27, 2017. FINDINGS: Stable cardiomegaly. Tracheostomy is in grossly good position. Distal tip of nasogastric tube is seen in distal esophagus. No pneumothorax is noted. Right-sided PICC line is noted with distal tip in expected position of SVC. Mild bibasilar subsegmental atelectasis is noted. Bony thorax is unremarkable. IMPRESSION: Mild bibasilar subsegmental atelectasis. Endotracheal tube in grossly good position. Distal tip of nasogastric tube has withdrawn into distal esophagus. Electronically Signed   By: Marijo Conception, M.D.   On: 07/29/2017 22:01    Dg Chest Port 1 View  Result Date: 07/27/2017 CLINICAL DATA:  Excessive fluid EXAM: PORTABLE CHEST 1 VIEW COMPARISON:  Chest x-ray of July 23, 2017 FINDINGS: The lungs are well-expanded. There is a trace of pleural fluid on the right and slightly more on the left. The retrocardiac region remains dense. The cardiac silhouette is enlarged. The central pulmonary vascularity is prominent. The mediastinum is normal in width. The trachea is midline. The right-sided PICC line tip projects over the midportion of the SVC. The tracheostomy tube tip projects at the superior margin of the clavicular heads. The bony thorax exhibits no acute abnormality. IMPRESSION: Slight improved aeration of both lungs today. Small amounts of pleural fluid at both bases. Persistent atelectasis or pneumonia in the left lower lobe. Top-normal cardiac size with central pulmonary vascular congestion but no definite pulmonary edema. Electronically Signed   By: David  Martinique M.D.   On: 07/27/2017 10:55   Dg Chest Port 1 View  Result Date: 07/23/2017 CLINICAL DATA:  Acute on chronic respiratory failure. Airway aspiration. EXAM: PORTABLE CHEST 1 VIEW COMPARISON:  07/22/2017. FINDINGS: Interval enlargement of the cardiac silhouette. Stable dense left lower lobe airspace opacity. Interval increased linear and patchy density at the right lung base. Stable small bilateral pleural effusions. Nasogastric tube extending into the stomach. Thoracic spine degenerative changes. IMPRESSION: 1. No significant change in dense left lower lobe atelectasis or pneumonia and small left pleural effusion. 2. Increased right basilar atelectasis or pneumonia with a stable small right pleural effusion. Electronically Signed   By: Claudie Revering M.D.   On: 07/23/2017 16:09   Dg Chest Port 1 View  Result Date: 07/22/2017 CLINICAL DATA:  Respiratory failure.  COPD. EXAM: PORTABLE CHEST 1 VIEW COMPARISON:  07/19/2017. FINDINGS: The cardiac silhouette remains  borderline enlarged. Stable left pleural effusion and left basilar airspace opacity. Small right pleural effusion, decreased, with decreased adjacent right basilar atelectasis. Thoracic spine degenerative changes. IMPRESSION: 1. Stable left pleural effusion and left lower lobe atelectasis or pneumonia. 2. Decreased right pleural fluid and right basilar atelectasis. Electronically Signed   By: Claudie Revering M.D.   On: 07/22/2017 09:14   Dg Chest Port 1 View  Result Date: 07/19/2017 CLINICAL DATA:  Respiratory failure EXAM: PORTABLE CHEST 1 VIEW COMPARISON:  July 15, 2017 FINDINGS: There are small pleural effusions bilaterally. There is consolidation in the left lower lobe. Lungs elsewhere are clear. Heart size is upper normal with pulmonary vascularity within normal limits. No adenopathy. There is degenerative change in the thoracic spine. IMPRESSION: Left lower lobe consolidation with small pleural effusions bilaterally. There is mild bibasilar atelectasis. Stable cardiac silhouette. Electronically Signed   By: Lowella Grip III M.D.   On: 07/19/2017 07:57   Dg Chest Port 1 View  Result Date: 07/15/2017 CLINICAL DATA:  Acute respiratory failure. EXAM: PORTABLE CHEST 1 VIEW COMPARISON:  Chest x-ray from yesterday FINDINGS: The heart size and mediastinal contours are within normal limits. Normal pulmonary vascularity. Unchanged left greater than right bibasilar atelectasis and scarring at the costophrenic angles. No focal consolidation, pleural effusion, or pneumothorax. No acute osseous abnormality. IMPRESSION: Stable bibasilar atelectasis/scarring. Electronically Signed   By: Titus Dubin M.D.   On: 07/15/2017 10:41   Dg Chest Portable 1 View  Result Date: 07/14/2017 CLINICAL DATA:  Shortness of breath. EXAM: PORTABLE CHEST 1 VIEW COMPARISON:  06/05/2017. FINDINGS: Mediastinum and hilar structures normal. Stable mild cardiomegaly with normal pulmonary vascularity. Low lung volumes with mild  basilar atelectasis. Stable bilateral pleural thickening consistent scarring. Degenerative changes thoracic spine. IMPRESSION: Low lung volumes with mild basilar atelectasis and/or pleuroparenchymal scarring again noted. Stable mild cardiomegaly. Chest is stable  from prior exam. Electronically Signed   By: [place]   On: 07/14/2017 17:13   Dg Abd Portable 1v  Result Date: 08/04/2017 CLINICAL DATA:  Nasogastric placement. EXAM: PORTABLE ABDOMEN - 1 VIEW COMPARISON:  07/31/2017 FINDINGS: Nasogastric tube enters the stomach, curves along the greater curvature and has its tip in the antrum. Moderate amount of intestinal gas is noted without obstructive pattern. IMPRESSION: Nasogastric tube tip in the gastric antrum. Electronically Signed   By: Nelson Chimes M.D.   On: 08/04/2017 19:23   [place] Ekg Site Rite  Result Date: 07/25/2017 If Site Rite image not attached, placement could not be confirmed due to current cardiac rhythm.    CBC Recent Labs  Lab 08/03/17 0539 08/04/17 0559 08/05/17 0754  WBC 10.0 7.9 5.7  HGB 9.0* 8.2* 9.0*  HCT 27.9* 26.1* 28.6*  PLT 268 257 270  MCV 91.7 91.4 91.6  MCH 29.7 28.8 28.9  MCHC 32.4 31.5* 31.5*  RDW 15.7* 16.0* 15.7*  LYMPHSABS 0.6* 0.9*  --   MONOABS 0.6 0.6  --   EOSABS 0.1 0.2  --   BASOSABS 0.0 0.0  --     Chemistries  Recent Labs  Lab 08/03/17 0539 08/04/17 0559 08/05/17 0754 08/06/17 0515 08/07/17 0622  NA 139 141 136 138 139  K 3.7 3.4* 4.3 3.6 4.0  CL 99* 99* 99* 97* 100*  CO2 33* 35* 30 33* 33*  GLUCOSE 118* 87 99 99 115*  BUN 20 17 16 18  21*  CREATININE 0.47 <0.30* <0.30* <0.30* <0.30*  CALCIUM 9.0 9.0 9.1 9.4 9.2  MG 1.9 1.7 1.8  --  1.8   ------------------------------------------------------------------------------------------------------------------ CrCl cannot be calculated (This lab value cannot be used to calculate CrCl because it is not a number:  <0.30). ------------------------------------------------------------------------------------------------------------------ No results for input(s): HGBA1C in the last 72 hours. ------------------------------------------------------------------------------------------------------------------ No results for input(s): CHOL, HDL, LDLCALC, TRIG, CHOLHDL, LDLDIRECT in the last 72 hours. ------------------------------------------------------------------------------------------------------------------ No results for input(s): TSH, T4TOTAL, T3FREE, THYROIDAB in the last 72 hours.  Invalid input(s): FREET3 ------------------------------------------------------------------------------------------------------------------ Recent Labs    08/07/17 0622 08/08/17 0508  FOLATE 13.3  --   FERRITIN  --  176    Coagulation profile No results for input(s): INR, PROTIME in the last 168 hours.  No results for input(s): DDIMER in the last 72 hours.  Cardiac Enzymes No results for input(s): CKMB, TROPONINI, MYOGLOBIN in the last 168 hours.  Invalid input(s): CK ------------------------------------------------------------------------------------------------------------------ Invalid input(s): POCBNP    Assessment & Plan   62 year old female patient with history of chronic respiratory failure, GI bleed currently in the hospital for acute on chronic hypercapnic respiratory failure and anemia secondary to slow GI bleed.  1.Acute on chronic hypercapnic respiratory failure Has tracheostomy on ventilator support with pressure support Off antibiotics  2.  Moderate malnutrition   Continue NG tube feeding, appreciate GI following Swallow evaluation deferred PEG tube placement plan as per ICU attending  3. Electrolyte imbalance F/u magnesium and potassium periodically  4.  Acute on chronic gastrointestinal bleeding s/p EGD 3/18 showing Normal duodenal bulb and second portion of the duodenum.Duodenal  lipoma. Non-bleeding gastric ulcer with a clean ulcer base (Forrest Class III).Normal gastroesophageal junction and esophagus. - protonix 40 mg via NGT bid Hemoglobin stable - 5. DVT prophylaxis with SCD  6.  Constipation, patient started on stool softeners.  7. euthyroid sick syndrome.:  Continue Synthyroid.  8.  Physical therapy for endurance training        Code Status Orders  (  From admission, onward)        Start     Ordered   07/14/17 2115  Full code  Continuous     07/14/17 2122    Code Status History    Date Active Date Inactive Code Status Order ID Comments User Context   06/05/2017 1654 06/08/2017 1820 Full Code [id number]  Bettey Costa, MD Inpatient   11/01/2016 0121 11/09/2016 2044 Full Code [id number]  Lance Coon, MD Inpatient      Time Spent in minutes   35  Greater than 50% of time spent in care coordination and counseling patient regarding the condition and plan of care.   Saundra Shelling M.D on 08/09/2017 at 1:23 PM  Between 7am to 6pm - Pager - [phone]  After 6pm go to [email or web address] - Proofreader  [employer]   Office  [phone]

## 2017-08-09 NOTE — Progress Notes (Signed)
  [facility] Progess Note  CC prolonged ventilator dependence, tracheostomy status  HPI Cognition intact.  RASS 0.  Follows commands.  Not much progress weaning   VENTILATOR SETTINGS: Vent Mode: PRVC FiO2 (%):  [40 %] 40 % Set Rate:  [15 bmp] 15 bmp Vt Set:  [450 mL] 450 mL PEEP:  [5 cmH20] 5 cmH20 Pressure Support:  [14 cmH20-18 cmH20] 14 cmH20 Plateau Pressure:  [15 cmH20-25 cmH20] 25 cmH20  INTAKE / OUTPUT:  Intake/Output Summary (Last 24 hours) at 08/09/2017 1630 Last data filed at 08/09/2017 1400 Gross per 24 hour  Intake 1100 ml  Output 1300 ml  Net -200 ml    Name: Kayla Boyer MRN: [id number] DOB: 1955-12-18    ADMISSION DATE:  07/14/2017   VITAL SIGNS: Temp:  [97.8 F (36.6 C)-98.6 F (37 C)] 98.4 F (36.9 C) (04/10 0800) Pulse Rate:  [72-109] 72 (04/10 1500) Resp:  [15-36] 36 (04/10 1500) BP: (94-121)/(56-86) 117/74 (04/10 1400) SpO2:  [94 %-100 %] 98 % (04/10 1500) FiO2 (%):  [40 %] 40 % (04/10 1600) Weight:  [116 lb 2.9 oz (52.7 kg)] 116 lb 2.9 oz (52.7 kg) (04/10 0500)  PHYSICAL EXAMINATION: Physical Examination:   VS: BP 117/74   Pulse 72   Temp 98.4 F (36.9 C) (Oral)   Resp (!) 36   Ht 5' 2.5" (1.588 m)   Wt 116 lb 2.9 oz (52.7 kg)   SpO2 98%   BMI 20.91 kg/m   Gen: NAD HEENT: NCAT, sclera white Neck: No JVD, trach site clean Lungs: Breath sounds are diminished to absent on the right, full without adventitious sounds on the left Cardiovascular: RRR, no murmurs Abdomen: Soft, nontender, normal BS Ext: without clubbing, cyanosis, edema Neuro: grossly intact Skin: Limited exam, no lesions noted     LABORATORY PANEL:   CBC Recent Labs  Lab 08/05/17 0754  WBC 5.7  HGB 9.0*  HCT 28.6*  PLT 270    Chemistries  Recent Labs  Lab 08/07/17 0622  NA 139  K 4.0  CL 100*  CO2 33*  GLUCOSE 115*  BUN 21*  CREATININE <0.30*  CALCIUM 9.2  MG 1.8  PHOS 4.0    Recent Labs  Lab 08/08/17 0613  08/08/17 1211 08/09/17 0000 08/09/17 0605 08/09/17 0735 08/09/17 1138  GLUCAP 117* 105* 100* 107* 104* 97   CXR; collapse of RLL with right pleural effusion    ASSESSMENT/PLAN  Prolonged VDRF - little progress weaning Acute on chronic hypercarbic respiratory failure RLL collapse Tracheostomy status R pleural eff Recent GIB - appears inactive Deconditioning  Cont vent support - settings reviewed and/or adjusted Wean in PSV as tolerated Cont vent bundle Airway hygiene with chest percussion, nebulized N-acetylcysteine, frequent suctioning Follow CXR Consider thoracentesis 04/11 Continue PPI therapy Continue nutritional support G-tube planned for 04/11 Continue PT OT Working on placement.  Will likely need vent SNF   Merton Border, MD PCCM service Mobile [phone] Pager [phone] 08/09/2017 4:35 PM

## 2017-08-10 ENCOUNTER — Encounter: Payer: Self-pay | Admitting: Certified Registered Nurse Anesthetist

## 2017-08-10 ENCOUNTER — Inpatient Hospital Stay: Payer: Medicaid Other

## 2017-08-10 ENCOUNTER — Encounter
Admission: EM | Disposition: A | Discharge status: Skilled Nursing Facility | Payer: Self-pay | Source: Home / Self Care | Attending: Internal Medicine

## 2017-08-10 DIAGNOSIS — Z930 Tracheostomy status: Secondary | ICD-10-CM

## 2017-08-10 LAB — CBC
HEMATOCRIT: 29.5 % — AB (ref 35.0–47.0)
Hemoglobin: 9.3 g/dL — ABNORMAL LOW (ref 12.0–16.0)
MCH: 28.5 pg (ref 26.0–34.0)
MCHC: 31.3 g/dL — AB (ref 32.0–36.0)
MCV: 90.9 fL (ref 80.0–100.0)
Platelets: 304 10*3/uL (ref 150–440)
RBC: 3.25 MIL/uL — ABNORMAL LOW (ref 3.80–5.20)
RDW: 15.9 % — AB (ref 11.5–14.5)
WBC: 10.6 10*3/uL (ref 3.6–11.0)

## 2017-08-10 LAB — BASIC METABOLIC PANEL
Anion gap: 6 (ref 5–15)
BUN: 16 mg/dL (ref 6–20)
CALCIUM: 9.3 mg/dL (ref 8.9–10.3)
CO2: 32 mmol/L (ref 22–32)
Chloride: 101 mmol/L (ref 101–111)
Creatinine, Ser: <0.3 mg/dL — ABNORMAL LOW (ref 0.44–1.00)
Glucose, Bld: 83 mg/dL (ref 65–99)
Potassium: 3.9 mmol/L (ref 3.5–5.1)
Sodium: 139 mmol/L (ref 135–145)

## 2017-08-10 LAB — GLUCOSE, CAPILLARY
GLUCOSE-CAPILLARY: 84 mg/dL (ref 65–99)
Glucose-Capillary: 112 mg/dL — ABNORMAL HIGH (ref 65–99)
Glucose-Capillary: 59 mg/dL — ABNORMAL LOW (ref 65–99)
Glucose-Capillary: 81 mg/dL (ref 65–99)

## 2017-08-10 SURGERY — INSERTION, PEG TUBE
Anesthesia: General

## 2017-08-10 MED ORDER — MORPHINE SULFATE (PF) 2 MG/ML IV SOLN
2.0000 mg | Freq: Once | INTRAVENOUS | Status: AC
  Administered 2017-08-10: 2 mg via INTRAVENOUS
  Filled 2017-08-10: qty 1

## 2017-08-10 MED ORDER — PANTOPRAZOLE SODIUM 40 MG PO PACK
40.0000 mg | PACK | Freq: Every day | ORAL | Status: DC
  Administered 2017-08-11 – 2017-08-17 (×7): 40 mg
  Filled 2017-08-10 (×7): qty 20

## 2017-08-10 MED ORDER — SODIUM CHLORIDE 0.9 % IV SOLN: INTRAVENOUS | Status: DC

## 2017-08-10 MED ORDER — DEXTROSE 50 % IV SOLN
25.0000 mL | Freq: Once | INTRAVENOUS | Status: AC
  Administered 2017-08-10: 25 mL via INTRAVENOUS

## 2017-08-10 MED ORDER — CEFAZOLIN SODIUM-DEXTROSE 2-4 GM/100ML-% IV SOLN
2.0000 g | Freq: Once | INTRAVENOUS | Status: AC
  Administered 2017-08-10: 2 g via INTRAVENOUS
  Filled 2017-08-10: qty 100

## 2017-08-10 MED ORDER — PROPOFOL 1000 MG/100ML IV EMUL
5.0000 ug/kg/min | INTRAVENOUS | Status: DC
  Administered 2017-08-10: 10 ug/kg/min via INTRAVENOUS
  Filled 2017-08-10: qty 100

## 2017-08-10 NOTE — Op Note (Addendum)
[facility] Gastroenterology Patient Name: Kayla Boyer Procedure Date: 08/10/2017 11:48 AM MRN: [id number] Account #: [id number] Date of Birth: 07/20/1955 Admit Type: Inpatient Age: 62 Room[facility] ENDO ROOM 2 Gender: Female Note Status: Finalized Procedure:            Upper GI endoscopy Indications:          Place PEG because patient requires ventilator support Providers:            Lin Landsman MD, MD, Jonathon Bellows MD, MD Medicines:            Monitored Anesthesia Care Complications:        No immediate complications. Estimated blood loss:                        Minimal. Procedure:            Pre-Anesthesia Assessment:                       - Prior to the procedure, a History and Physical was                        performed, and patient medications and allergies were                        reviewed. The patient is competent. The risks and                        benefits of the procedure and the sedation options and                        risks were discussed with the patient. All questions                        were answered and informed consent was obtained.                        Patient identification and proposed procedure were                        verified by the physician, the nurse and the technician                        in the procedure room. Mental Status Examination: alert                        and oriented. Airway Examination: tracheostomy via                        ventilator. Respiratory Examination: clear to                        auscultation. CV Examination: tachycardia noted.                        Prophylactic Antibiotics: The patient requires                        prophylactic antibiotics for planned PEG placement. The  patient received antibiotic therapy today, before the                        procedure started. Prior Anticoagulants: The patient                        has taken no previous anticoagulant or  antiplatelet                        agents. ASA Grade Assessment: IV - A patient with                        severe systemic disease that is a constant threat to                        life. After reviewing the risks and benefits, the                        patient was deemed in satisfactory condition to undergo                        the procedure. The anesthesia plan was to use monitored                        anesthesia care (MAC). Immediately prior to                        administration of medications, the patient was                        re-assessed for adequacy to receive sedatives. The                        heart rate, respiratory rate, oxygen saturations, blood                        pressure, adequacy of pulmonary ventilation, and                        response to care were monitored throughout the                        procedure. The physical status of the patient was                        re-assessed after the procedure.                       After obtaining informed consent, the endoscope was                        passed under direct vision. Throughout the procedure,                        the patient's blood pressure, pulse, and oxygen                        saturations were monitored continuously. The Endoscope  was introduced through the mouth, and advanced to the                        second part of duodenum. The upper GI endoscopy was                        accomplished without difficulty. The patient tolerated                        the procedure fairly well. Findings:      There was a small lipoma in the second portion of the duodenum.      The duodenal bulb and second portion of the duodenum were normal.      Localized mildly erythematous mucosa without bleeding was found in the       prepyloric region of the stomach.      The entire examined stomach was normal. The patient was placed in the       supine position for PEG placement. The  stomach was insufflated to appose       gastric and abdominal walls. A site was located in the body of the       stomach with excellent transillumination for placement. The abdominal       wall was marked and prepped in a sterile manner. The area was       anesthetized with 2 mL of 1% lidocaine. The trocar needle was introduced       through the abdominal wall and into the stomach under direct endoscopic       view. A snare was introduced through the endoscope and opened in the       gastric lumen. The guide wire was passed through the trocar and into the       open snare. The snare was closed around the guide wire. The endoscope       and snare were removed, pulling the wire out through the mouth. A skin       incision was made at the site of needle insertion. The externally       removable 20 Fr EndoVive Safety gastrostomy tube was lubricated. The       G-tube was tied to the guide wire and pulled through the mouth and into       the stomach. The trocar needle was removed, and the gastrostomy tube was       pulled out from the stomach through the skin. The external bumper was       attached to the gastrostomy tube, and the tube was cut to remove the       guide wire. The final position of the gastrostomy tube was confirmed by       relook endoscopy, and skin marking noted to be 5.5 cm at the external       bumper. The final tension and compression of the abdominal wall by the       PEG tube and external bumper were checked and revealed that the bumper       was moderately tight and mildly deforming the skin. The feeding tube was       capped, and the tube site cleaned and dressed.      The cardia and gastric fundus were normal on retroflexion.      The gastroesophageal junction and examined esophagus were normal. Impression:           -  Duodenal lipoma.                       - Normal duodenal bulb and second portion of the                        duodenum.                       -  Erythematous mucosa in the prepyloric region of the                        stomach.                       - Normal stomach.                       - Normal gastroesophageal junction and esophagus.                       - An externally removable PEG placement was                        successfully completed.                       - No specimens collected. Recommendation:       - Please follow the post-PEG recommendations including:                        Nutrition consult for formula and volume, advance food                        and medications per primary care provider, external                        bolster 1 cm from abdominal wall, change dressing once                        per day, may use PEG today for meds and water, may use                        PEG tomorrow for feedings and clean site with soap and                        water daily and dry thoroughly. 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 All rights reserved. The codes documented in this report are preliminary and upon coder review may  be revised to meet current compliance requirements. Dr. Ulyess Mort Lin Landsman MD, MD 08/10/2017 1:02:27 PM This report has been signed electronically. Jonathon Bellows MD, MD Number of Addenda: 0 Note Initiated On: 08/10/2017 11:48 AM      [facility]

## 2017-08-10 NOTE — Progress Notes (Signed)
Pt remains alert and oriented, no complaints of pain.  Remains on pressure support on vent.  Tube feedings stopped at midnight, as pt is to have PEG tube placed today.  Vital signs stable, afebrile.  Adequate urine output from external catheter.

## 2017-08-10 NOTE — Progress Notes (Signed)
Report given to Vivien Rota, RN.  Pt.'s VSS, A&O, pt. On ventilator no care concerns at this time.

## 2017-08-10 NOTE — Progress Notes (Signed)
[place] [facility]                                                                                                                                                                                  Kayla Boyer Demographics   Kayla Boyer, is a 62 y.o. female, DOB - 10/31/1955, LKG:[id number]  Admit date - 07/14/2017   Admitting Physician Saundra Shelling, MD  Outpatient Primary MD for the Kayla Boyer is Kayla Boyer, [name]   LOS - 27  Subjective: Kayla Boyer seen and evaluated today On tracheostomy with vent support requiring pressure support Communicates by writing on the pad and on verbal commands No complaints of pain Has a NG tube feeding   Review of Systems:   CONSTITUTIONAL: No documented fever. No fatigue, weakness. No weight gain, no weight loss.  EYES: No blurry or double vision.  ENT: No tinnitus. No postnasal drip. No redness of the oropharynx.  Has tracheostomy RESPIRATORY: No cough, no wheeze, no hemoptysis. No dyspnea.  CARDIOVASCULAR: No chest pain. No orthopnea. No palpitations. No syncope.  GASTROINTESTINAL: No nausea, no vomiting or diarrhea. No abdominal pain. No melena or hematochezia. GENITOURINARY: No dysuria or hematuria.  ENDOCRINE: No polyuria or nocturia. No heat or cold intolerance.  HEMATOLOGY: No anemia. No bruising. No bleeding.  INTEGUMENTARY: No rashes. No lesions.  MUSCULOSKELETAL: No arthritis. No swelling. No gout.  NEUROLOGIC: No numbness, tingling, or ataxia. No seizure-type activity.  PSYCHIATRIC: No anxiety. No insomnia. No ADD.    Vitals:   Vitals:   08/10/17 0900 08/10/17 0943 08/10/17 1000 08/10/17 1128  BP: 113/73  110/62   Pulse: 89  87   Resp: 15  17   Temp:      TempSrc:      SpO2: 100% 100% 100% 100%  Weight:      Height:        Wt Readings from Last 3 Encounters:  08/09/17 52.7 kg (116 lb 2.9 oz)  07/05/17 60.8 kg (134 lb)  07/04/17 60.8 kg (134 lb)     Intake/Output Summary (Last 24 hours) at  08/10/2017 1355 Last data filed at 08/10/2017 0500 Gross per 24 hour  Intake 445.83 ml  Output 700 ml  Net -254.17 ml    Physical Exam:   GENERAL: Pleasant-appearing female Kayla Boyer lying on the bed with ventilator support via tracheostomy HEAD, EYES, EARS, NOSE AND THROAT: Atraumatic, normocephalic. Extraocular muscles are intact. Pupils equal and reactive to light. Sclerae anicteric. No conjunctival injection. No oro-pharyngeal erythema.  NECK: Supple. There is no jugular venous distention. No bruits, no lymphadenopathy, no thyromegaly.  Has tracheostomy, Has NGT for feeds HEART: Regular rate and rhythm,. No murmurs,  no rubs, no clicks.  LUNGS: Clear to auscultation bilaterally. No rales or rhonchi. No wheezes.  Has tracheostomy with ventilator support Tidal volume 450 rate 15 PEEP 5 FiO2 30% ABDOMEN: Soft, flat, nontender, nondistended. Has good bowel sounds. No hepatosplenomegaly appreciated.  EXTREMITIES: No evidence of any cyanosis, clubbing, or peripheral edema.  +2 pedal and radial pulses bilaterally.  NEUROLOGIC: The Kayla Boyer is alert, awake, and oriented x3 with no focal motor or sensory deficits appreciated bilaterally.  SKIN: Moist and warm with no rashes appreciated.  Psych: Not anxious, depressed LN: No inguinal LN enlargement    Antibiotics   Anti-infectives (From admission, onward)   Start     Dose/Rate Route Frequency Ordered Stop   08/10/17 1300  ceFAZolin (ANCEF) IVPB 2g/100 mL premix     2 g 200 mL/hr over 30 Minutes Intravenous Once 08/10/17 1257 08/10/17 1344   08/04/17 2200  ceFEPIme (MAXIPIME) 1 g in sodium chloride 0.9 % 100 mL IVPB  Status:  Discontinued     1 g 200 mL/hr over 30 Minutes Intravenous Every 8 hours 08/04/17 1218 08/04/17 1448   08/04/17 2200  ceFEPIme (MAXIPIME) 1 g in sodium chloride 0.9 % 100 mL IVPB  Status:  Discontinued     1 g 200 mL/hr over 30 Minutes Intravenous Every 8 hours 08/04/17 1448 08/07/17 1119   08/03/17 0100   piperacillin-tazobactam (ZOSYN) IVPB 3.375 g  Status:  Discontinued     3.375 g 12.5 mL/hr over 240 Minutes Intravenous Every 8 hours 08/02/17 1701 08/02/17 1704   08/02/17 1730  piperacillin-tazobactam (ZOSYN) IVPB 3.375 g  Status:  Discontinued     3.375 g 12.5 mL/hr over 240 Minutes Intravenous Every 8 hours 08/02/17 1704 08/04/17 1217   08/02/17 1645  piperacillin-tazobactam (ZOSYN) IVPB 3.375 g  Status:  Discontinued     3.375 g 12.5 mL/hr over 240 Minutes Intravenous Every 8 hours 08/02/17 1639 08/02/17 1701   07/14/17 2200  azithromycin (ZITHROMAX) 500 mg in sodium chloride 0.9 % 250 mL IVPB  Status:  Discontinued     500 mg 250 mL/hr over 60 Minutes Intravenous Every 24 hours 07/14/17 2122 07/18/17 0915      Medications   Scheduled Meds: . acetylcysteine  3 mL Nebulization TID  . albuterol  2.5 mg Nebulization Q8H  . chlorhexidine  15 mL Mouth Rinse BID  . feeding supplement (PRO-STAT SUGAR FREE 64)  30 mL Per Tube BID  . free water  100 mL Per Tube Q6H  . liver oil-zinc oxide   Topical Daily  . mouth rinse  15 mL Mouth Rinse QID  . mirtazapine  15 mg Per Tube QHS  . pantoprazole sodium  40 mg Per Tube BID  . senna-docusate  2 tablet Oral BID  . sodium chloride flush  10-40 mL Intracatheter Q12H  . thyroid  30 mg Per Tube QAC breakfast   Continuous Infusions: . sodium chloride    . feeding supplement (JEVITY 1.2 CAL) Stopped (08/10/17 0000)  . propofol (DIPRIVAN) infusion Stopped (08/10/17 1330)   PRN Meds:.acetaminophen, ALPRAZolam, bisacodyl, [DISCONTINUED] ondansetron **OR** ondansetron (ZOFRAN) IV, simethicone, sodium chloride flush   Data Review:   Micro Results Recent Results (from the past 240 hour(s))  Culture, respiratory (NON-Expectorated)     Status: None   Collection Time: 08/02/17  6:23 PM  Result Value Ref Range Status   Specimen Description   Final    TRACHEAL ASPIRATE Performed at [facility], [address], [place], [place]  [address]  Special Requests   Final    NONE Performed at [facility], [place], [place] [address]    Gram Stain   Final    ABUNDANT WBC PRESENT, PREDOMINANTLY PMN FEW GRAM POSITIVE RODS    Culture   Final    Consistent with normal respiratory flora. Performed at [facility], [place] [address], [place], [place] [address]    Report Status 08/05/2017 FINAL  Final  Culture, respiratory (NON-Expectorated)     Status: None   Collection Time: 08/06/17  9:32 PM  Result Value Ref Range Status   Specimen Description   Final    INDUCED SPUTUM Performed at [facility], [address] [place], [place] [address]    Special Requests   Final    NONE Performed at [facility], [address] [place], [place] [address]    Gram Stain   Final    RARE WBC PRESENT, PREDOMINANTLY PMN NO ORGANISMS SEEN    Culture   Final    RARE Consistent with normal respiratory flora. Performed at [facility], [employer] [address] [place], [place] [address]    Report Status 08/09/2017 FINAL  Final  Culture, respiratory (NON-Expectorated)     Status: None (Preliminary result)   Collection Time: 08/09/17  3:24 PM  Result Value Ref Range Status   Specimen Description   Final    TRACHEAL ASPIRATE Performed at [facility], [address] [place], [place] [address]    Special Requests   Final    Normal Performed at [facility], [address] [place], [place] [address]    Gram Stain   Final    ABUNDANT WBC PRESENT,BOTH PMN AND MONONUCLEAR NO ORGANISMS SEEN    Culture   Final    CULTURE REINCUBATED FOR BETTER GROWTH Performed at [facility], [place] [address], [place], [place] [address]    Report Status PENDING  Incomplete    Radiology Reports Dg Abd 1 View  Result Date: 07/31/2017 CLINICAL DATA:  Nasogastric tube placement. EXAM: ABDOMEN - 1 VIEW COMPARISON:  Radiograph July 22, 2017. FINDINGS: The bowel gas pattern is  normal. Distal tip of feeding tube is seen in distal stomach. No radio-opaque calculi or other significant radiographic abnormality are seen. IMPRESSION: Distal tip of nasogastric tube seen in distal stomach. No evidence of bowel obstruction or ileus. Electronically Signed   By: Marijo Conception, M.D.   On: 07/31/2017 11:47   Dg Abd 1 View  Result Date: 07/22/2017 CLINICAL DATA:  Encounter for feeding tube placement. EXAM: ABDOMEN - 1 VIEW COMPARISON:  Chest radiograph 07/22/2017 FINDINGS: A nasogastric tube has been advanced into the abdomen. Catheter tip is in the distal stomach region. Bowel gas throughout the abdomen. Persistent densities at the left lung base and the left hemidiaphragm remains obscured. IMPRESSION: Feeding tube tip in the distal stomach region. Electronically Signed   By: Markus Daft M.D.   On: 07/22/2017 16:30   Ct Head Wo Contrast  Result Date: 07/23/2017 CLINICAL DATA:  62 y/o F; changes to left pupil in strength of left arm. EXAM: CT HEAD WITHOUT CONTRAST TECHNIQUE: Contiguous axial images were obtained from the base of the skull through the vertex without intravenous contrast. COMPARISON:  None. FINDINGS: Brain: No evidence of acute infarction, hemorrhage, hydrocephalus, extra-axial collection or mass lesion/mass effect. Vascular: Calcific atherosclerosis of carotid siphons. No hyperdense vessel. Skull: Normal. Negative for fracture or focal lesion. Sinuses/Orbits: No acute  finding. Other: None. IMPRESSION: Negative CT of the head. Electronically Signed   By: Kristine Garbe M.D.   On: 07/23/2017 14:33   Nm Gi Blood Loss  Result Date: 07/20/2017 CLINICAL DATA:  GI bleeding EXAM: NUCLEAR MEDICINE GASTROINTESTINAL BLEEDING SCAN TECHNIQUE: Sequential abdominal images were obtained following intravenous administration of Tc-37m labeled red blood cells. RADIOPHARMACEUTICALS:  Twenty mCi Tc-42m pertechnetate in-vitro labeled red cells. COMPARISON:  None. FINDINGS: Normal  distribution of radiotracer is noted. No focal area of tracer accumulation is identified to suggest active GI bleeding is noted. IMPRESSION: No findings to suggest active GI hemorrhage. Electronically Signed   By: Inez Catalina M.D.   On: 07/20/2017 15:16   US Venous Img Upper Bilat  Result Date: 07/16/2017 CLINICAL DATA:  Bilateral upper extremity edema acutely with pain and discomfort for 1 day EXAM: BILATERAL UPPER EXTREMITY VENOUS DOPPLER ULTRASOUND TECHNIQUE: Gray-scale sonography with graded compression, as well as color Doppler and duplex ultrasound were performed to evaluate the bilateral upper extremity deep venous systems from the level of the subclavian vein and including the jugular, axillary, basilic, radial, ulnar and upper cephalic vein. Spectral Doppler was utilized to evaluate flow at rest and with distal augmentation maneuvers. COMPARISON:  None. FINDINGS: Exam is limited because of upper extremity subcutaneous edema. RIGHT UPPER EXTREMITY Internal Jugular Vein: No evidence of thrombus. Normal compressibility, respiratory phasicity and response to augmentation. Subclavian Vein: No evidence of thrombus. Normal compressibility, respiratory phasicity and response to augmentation. Axillary Vein: No evidence of thrombus. Normal compressibility, respiratory phasicity and response to augmentation. Cephalic Vein: No evidence of thrombus. Normal compressibility, respiratory phasicity and response to augmentation. Basilic Vein: Not well visualized to accurately assess. Brachial Veins: No evidence of thrombus. Normal compressibility, respiratory phasicity and response to augmentation. Radial Veins: No evidence of thrombus. Normal compressibility, respiratory phasicity and response to augmentation. Ulnar Veins: No evidence of thrombus. Normal compressibility, respiratory phasicity and response to augmentation. Venous Reflux:  None. Other Findings:  Peripheral edema noted LEFT UPPER EXTREMITY Internal  Jugular Vein: No evidence of thrombus. Normal compressibility, respiratory phasicity and response to augmentation. Subclavian Vein: No evidence of thrombus. Normal compressibility, respiratory phasicity and response to augmentation. Axillary Vein: No evidence of thrombus. Normal compressibility, respiratory phasicity and response to augmentation. Cephalic Vein: No evidence of thrombus. Normal compressibility, respiratory phasicity and response to augmentation. Basilic Vein: No evidence of thrombus. Normal compressibility, respiratory phasicity and response to augmentation. Brachial Veins: No evidence of thrombus. Normal compressibility, respiratory phasicity and response to augmentation. Radial Veins: No evidence of thrombus. Normal compressibility, respiratory phasicity and response to augmentation. Ulnar Veins: No evidence of thrombus. Normal compressibility, respiratory phasicity and response to augmentation. Venous Reflux:  None. Other Findings:  But peripheral subcutaneous edema noted. IMPRESSION: Limited exam but no gross occlusive upper extremity DVT in either arm. Electronically Signed   By: Jerilynn Mages.  Shick M.D.   On: 07/16/2017 12:34   US Venous Img Upper Uni Left  Result Date: 07/25/2017 CLINICAL DATA:  Left upper extremity edema. EXAM: LEFT UPPER EXTREMITY VENOUS DOPPLER ULTRASOUND TECHNIQUE: Gray-scale sonography with graded compression, as well as color Doppler and duplex ultrasound were performed to evaluate the upper extremity deep venous system from the level of the subclavian vein and including the jugular, axillary, basilic, radial, ulnar and upper cephalic vein. Spectral Doppler was utilized to evaluate flow at rest and with distal augmentation maneuvers. COMPARISON:  None. FINDINGS: Contralateral Subclavian Vein: Respiratory phasicity is normal and symmetric with the symptomatic side. No evidence of  thrombus. Normal compressibility. Internal Jugular Vein: No evidence of thrombus. Normal  compressibility, respiratory phasicity and response to augmentation. Subclavian Vein: No evidence of thrombus. Normal compressibility, respiratory phasicity and response to augmentation. Axillary Vein: No evidence of thrombus. Normal compressibility, respiratory phasicity and response to augmentation. Cephalic Vein: No evidence of thrombus. Normal compressibility, respiratory phasicity and response to augmentation. Basilic Vein: No evidence of thrombus. Normal compressibility, respiratory phasicity and response to augmentation. Brachial Veins: No evidence of thrombus. Normal compressibility, respiratory phasicity and response to augmentation. Radial Veins: No evidence of thrombus. Normal compressibility, respiratory phasicity and response to augmentation. Ulnar Veins: No evidence of thrombus. Normal compressibility, respiratory phasicity and response to augmentation. Venous Reflux:  None visualized. Other Findings: There is significant edema noted in the left upper arm including some more locally marginated fluid in the subcutaneous tissues of the left medial upper arm. This fluid appears to extend over a long distance and likely represents subcutaneous fluid which is more prominent in 1 area of the upper arm. If there is any concern for soft tissue infection, additional imaging may be warranted. IMPRESSION: 1. No evidence of left upper extremity deep venous thrombosis. 2. Significant subcutaneous fluid in the left upper arm with an area of more marginated subcutaneous fluid also identified in the medial left upper arm. Although this does not have the appearance of a focal abscess by ultrasound, if there is concern for upper extremity infection, additional imaging may be warranted with CT and/or MRI. Electronically Signed   By: Aletta Edouard M.D.   On: 07/25/2017 17:48   Dg Chest Port 1 View  Result Date: 08/10/2017 CLINICAL DATA:  Respiratory failure EXAM: PORTABLE CHEST 1 VIEW COMPARISON:  Yesterday  FINDINGS: Significantly improved aeration on the right. Haziness of the lower chest attributed to bilateral pleural fluid and presumed atelectasis. Normal heart size. Negative mediastinal contours. Right upper extremity PICC with tip at the SVC. An orogastric tube reaches the stomach. Tracheostomy tube. IMPRESSION: 1. Significantly improved aeration on the right from interval thoracentesis or lobar re-expansion. 2. Hazy opacities at the bases is likely atelectasis and small effusions. Electronically Signed   By: Monte Fantasia M.D.   On: 08/10/2017 07:29   Dg Chest Port 1 View  Result Date: 08/09/2017 CLINICAL DATA:  Respiratory failure EXAM: PORTABLE CHEST 1 VIEW COMPARISON:  Portable chest x-ray of August 06, 2017 FINDINGS: There has been markedly increased volume loss on the right due to increasing pleural fluid. Only approximately 1/4 of the pleural space volume is occupied by aerated lung. There is mild shift of the mediastinum toward the right which is stable. The left lung is mildly hyperinflated. There is a tiny left pleural effusion. The left heart border is normal. The pulmonary vascularity is not engorged. The tracheostomy tube tip lies just above the superior margin of the clavicles. The esophagogastric tube tip in proximal port project below the GE junction. The right PICC line tip projects over the midportion of the SVC. IMPRESSION: Markedly increased volume loss on the right consistent with increasing pleural fluid volume. Electronically Signed   By: David  Martinique M.D.   On: 08/09/2017 12:38   Dg Chest Port 1 View  Result Date: 08/06/2017 CLINICAL DATA:  Chronic ventilator dependent respiratory failure. Follow-up RIGHT lower lobe atelectasis and/or pneumonia. EXAM: PORTABLE CHEST 1 VIEW COMPARISON:  08/05/2017, 08/04/2017 and earlier. FINDINGS: Tracheostomy tube tip in satisfactory position projecting below the thoracic inlet. RIGHT arm PICC tip projects over the LOWER SVC. Nasogastric tube  courses below the diaphragm into the stomach. Cardiac silhouette upper normal in size for AP portable technique, unchanged. Dense airspace consolidation in the RIGHT lower lobe, unchanged, possibly associated with a RIGHT pleural effusion. LEFT lung clear, without evidence of recurrent LEFT lower lobe atelectasis as noted on the examination 2 days ago. No new pulmonary parenchymal abnormalities in either lung. IMPRESSION: 1.  Support apparatus satisfactory. 2. Stable dense RIGHT lower lobe atelectasis and/or pneumonia and possible associated RIGHT pleural effusion. 3. No new abnormalities. Electronically Signed   By: Evangeline Dakin M.D.   On: 08/06/2017 08:43   Dg Chest Port 1 View  Result Date: 08/05/2017 CLINICAL DATA:  Acute respiratory failure EXAM: PORTABLE CHEST 1 VIEW COMPARISON:  08/04/2017 FINDINGS: Tracheostomy in good position. Right arm PICC tip in the mid SVC. NG tube in the stomach. Progression of right lower lobe atelectasis/infiltrate. Mild left lower lobe atelectasis slightly improved. Small right effusion. Negative for edema. IMPRESSION: Mild progression of right lower lobe airspace disease. Mild improvement in left lower lobe atelectasis. Electronically Signed   By: Franchot Gallo M.D.   On: 08/05/2017 07:33   Dg Chest Port 1 View  Result Date: 08/04/2017 CLINICAL DATA:  Pneumonia EXAM: PORTABLE CHEST 1 VIEW COMPARISON:  08/03/2017, 08/02/2017, 07/30/2017 FINDINGS: Tracheostomy tube is in place. Worsening airspace disease at the right lung base. No change in dense left lung base consolidation. Probable small effusions. Stable enlarged cardiomediastinal silhouette. IMPRESSION: 1. Worsening airspace disease at the right lung base may reflect worsening atelectasis or pneumonia 2. No change in dense left lower lobe atelectasis or pneumonia. Probable small pleural effusions. Electronically Signed   By: Donavan Foil M.D.   On: 08/04/2017 03:58   Dg Chest Port 1 View  Result Date:  08/03/2017 CLINICAL DATA:  Pneumonia EXAM: PORTABLE CHEST 1 VIEW COMPARISON:  08/02/2017 FINDINGS: Tracheostomy in good position. Right arm PICC tip in the SVC. NG tube in the stomach. Bibasilar airspace disease unchanged. Small left effusion. Negative for edema. IMPRESSION: Bibasilar atelectasis/infiltrate unchanged. Support lines remain in good position. Electronically Signed   By: Franchot Gallo M.D.   On: 08/03/2017 07:14   Dg Chest Port 1 View  Result Date: 08/02/2017 CLINICAL DATA:  Shortness of Breath EXAM: PORTABLE CHEST 1 VIEW COMPARISON:  August 01, 2017 FINDINGS: Tracheostomy catheter tip is 5.0 cm above the carina. Central catheter tip is in the superior vena cava. Nasogastric tube tip and side port are below the diaphragm. No pneumothorax. There is airspace consolidation left lower lobe with small left pleural effusion. There is mild right base atelectasis. There is cardiomegaly with pulmonary vascularity within normal limits. No adenopathy. No bone lesions. IMPRESSION: Tube and catheter positions as described without evident pneumothorax. Airspace consolidation consistent with pneumonia left lower lobe with small left pleural effusion. Mild right base atelectasis. Stable cardiac prominence. Electronically Signed   By: Lowella Grip III M.D.   On: 08/02/2017 07:55   Dg Chest Port 1 View  Result Date: 08/01/2017 CLINICAL DATA:  Pneumonia EXAM: PORTABLE CHEST 1 VIEW COMPARISON:  07/30/2017, 07/29/2017, 07/27/2017, 07/23/2017 FINDINGS: Tracheostomy tube remains in place. Esophageal tube tip is below the diaphragm but non included. Interval opacification of the right diaphragm. No change in dense left lung base consolidation. Probable tiny pleural effusions. Enlarged cardiomediastinal silhouette is unchanged. Right upper extremity catheter tip overlies the SVC. IMPRESSION: 1. Increased opacity at the right base may reflect atelectasis 2. No significant change in probable small left effusion and dense  left lung base  consolidation, atelectasis versus pneumonia. 3. Stable cardiomegaly Electronically Signed   By: Donavan Foil M.D.   On: 08/01/2017 02:56   Dg Chest Port 1 View  Result Date: 07/30/2017 CLINICAL DATA:  Pneumonia EXAM: PORTABLE CHEST 1 VIEW COMPARISON:  07/29/2017 and prior radiographs FINDINGS: A tracheostomy tube, RIGHT PICC line with tip overlying the mid-LOWER SVC and endotracheal tube with tip overlying the distal esophagus again noted. Decreased LEFT LOWER lung consolidation/atelectasis noted. Mild RIGHT basilar atelectasis again noted. There is no evidence of pneumothorax. No other interval changes identified. IMPRESSION: 1. Decreased LEFT LOWER lung consolidation/atelectasis. 2. NG tube with tip overlying the distal esophagus-recommend advancement. Electronically Signed   By: Margarette Canada M.D.   On: 07/30/2017 07:49   Dg Chest Port 1 View  Result Date: 07/29/2017 CLINICAL DATA:  Acute on chronic respiratory failure. EXAM: PORTABLE CHEST 1 VIEW COMPARISON:  Radiograph July 27, 2017. FINDINGS: Stable cardiomegaly. Tracheostomy is in grossly good position. Distal tip of nasogastric tube is seen in distal esophagus. No pneumothorax is noted. Right-sided PICC line is noted with distal tip in expected position of SVC. Mild bibasilar subsegmental atelectasis is noted. Bony thorax is unremarkable. IMPRESSION: Mild bibasilar subsegmental atelectasis. Endotracheal tube in grossly good position. Distal tip of nasogastric tube has withdrawn into distal esophagus. Electronically Signed   By: Marijo Conception, M.D.   On: 07/29/2017 22:01   Dg Chest Port 1 View  Result Date: 07/27/2017 CLINICAL DATA:  Excessive fluid EXAM: PORTABLE CHEST 1 VIEW COMPARISON:  Chest x-ray of July 23, 2017 FINDINGS: The lungs are well-expanded. There is a trace of pleural fluid on the right and slightly more on the left. The retrocardiac region remains dense. The cardiac silhouette is enlarged. The central pulmonary  vascularity is prominent. The mediastinum is normal in width. The trachea is midline. The right-sided PICC line tip projects over the midportion of the SVC. The tracheostomy tube tip projects at the superior margin of the clavicular heads. The bony thorax exhibits no acute abnormality. IMPRESSION: Slight improved aeration of both lungs today. Small amounts of pleural fluid at both bases. Persistent atelectasis or pneumonia in the left lower lobe. Top-normal cardiac size with central pulmonary vascular congestion but no definite pulmonary edema. Electronically Signed   By: David  Martinique M.D.   On: 07/27/2017 10:55   Dg Chest Port 1 View  Result Date: 07/23/2017 CLINICAL DATA:  Acute on chronic respiratory failure. Airway aspiration. EXAM: PORTABLE CHEST 1 VIEW COMPARISON:  07/22/2017. FINDINGS: Interval enlargement of the cardiac silhouette. Stable dense left lower lobe airspace opacity. Interval increased linear and patchy density at the right lung base. Stable small bilateral pleural effusions. Nasogastric tube extending into the stomach. Thoracic spine degenerative changes. IMPRESSION: 1. No significant change in dense left lower lobe atelectasis or pneumonia and small left pleural effusion. 2. Increased right basilar atelectasis or pneumonia with a stable small right pleural effusion. Electronically Signed   By: Claudie Revering M.D.   On: 07/23/2017 16:09   Dg Chest Port 1 View  Result Date: 07/22/2017 CLINICAL DATA:  Respiratory failure.  COPD. EXAM: PORTABLE CHEST 1 VIEW COMPARISON:  07/19/2017. FINDINGS: The cardiac silhouette remains borderline enlarged. Stable left pleural effusion and left basilar airspace opacity. Small right pleural effusion, decreased, with decreased adjacent right basilar atelectasis. Thoracic spine degenerative changes. IMPRESSION: 1. Stable left pleural effusion and left lower lobe atelectasis or pneumonia. 2. Decreased right pleural fluid and right basilar atelectasis.  Electronically Signed   By: Remo Lipps  Joneen Caraway M.D.   On: 07/22/2017 09:14   Dg Chest Port 1 View  Result Date: 07/19/2017 CLINICAL DATA:  Respiratory failure EXAM: PORTABLE CHEST 1 VIEW COMPARISON:  July 15, 2017 FINDINGS: There are small pleural effusions bilaterally. There is consolidation in the left lower lobe. Lungs elsewhere are clear. Heart size is upper normal with pulmonary vascularity within normal limits. No adenopathy. There is degenerative change in the thoracic spine. IMPRESSION: Left lower lobe consolidation with small pleural effusions bilaterally. There is mild bibasilar atelectasis. Stable cardiac silhouette. Electronically Signed   By: Lowella Grip III M.D.   On: 07/19/2017 07:57   Dg Chest Port 1 View  Result Date: 07/15/2017 CLINICAL DATA:  Acute respiratory failure. EXAM: PORTABLE CHEST 1 VIEW COMPARISON:  Chest x-ray from yesterday FINDINGS: The heart size and mediastinal contours are within normal limits. Normal pulmonary vascularity. Unchanged left greater than right bibasilar atelectasis and scarring at the costophrenic angles. No focal consolidation, pleural effusion, or pneumothorax. No acute osseous abnormality. IMPRESSION: Stable bibasilar atelectasis/scarring. Electronically Signed   By: Titus Dubin M.D.   On: 07/15/2017 10:41   Dg Chest Portable 1 View  Result Date: 07/14/2017 CLINICAL DATA:  Shortness of breath. EXAM: PORTABLE CHEST 1 VIEW COMPARISON:  06/05/2017. FINDINGS: Mediastinum and hilar structures normal. Stable mild cardiomegaly with normal pulmonary vascularity. Low lung volumes with mild basilar atelectasis. Stable bilateral pleural thickening consistent scarring. Degenerative changes thoracic spine. IMPRESSION: Low lung volumes with mild basilar atelectasis and/or pleuroparenchymal scarring again noted. Stable mild cardiomegaly. Chest is stable from prior exam. Electronically Signed   By: Marcello Moores  Register   On: 07/14/2017 17:13   Dg Abd Portable  1v  Result Date: 08/04/2017 CLINICAL DATA:  Nasogastric placement. EXAM: PORTABLE ABDOMEN - 1 VIEW COMPARISON:  07/31/2017 FINDINGS: Nasogastric tube enters the stomach, curves along the greater curvature and has its tip in the antrum. Moderate amount of intestinal gas is noted without obstructive pattern. IMPRESSION: Nasogastric tube tip in the gastric antrum. Electronically Signed   By: Nelson Chimes M.D.   On: 08/04/2017 19:23   [place] Ekg Site Rite  Result Date: 07/25/2017 If Site Rite image not attached, placement could not be confirmed due to current cardiac rhythm.    CBC Recent Labs  Lab 08/04/17 0559 08/05/17 0754 08/10/17 0647  WBC 7.9 5.7 10.6  HGB 8.2* 9.0* 9.3*  HCT 26.1* 28.6* 29.5*  PLT 257 270 304  MCV 91.4 91.6 90.9  MCH 28.8 28.9 28.5  MCHC 31.5* 31.5* 31.3*  RDW 16.0* 15.7* 15.9*  LYMPHSABS 0.9*  --   --   MONOABS 0.6  --   --   EOSABS 0.2  --   --   BASOSABS 0.0  --   --     Chemistries  Recent Labs  Lab 08/04/17 0559 08/05/17 0754 08/06/17 0515 08/07/17 0622 08/10/17 0647  NA 141 136 138 139 139  K 3.4* 4.3 3.6 4.0 3.9  CL 99* 99* 97* 100* 101  CO2 35* 30 33* 33* 32  GLUCOSE 87 99 99 115* 83  BUN 17 16 18  21* 16  CREATININE <0.30* <0.30* <0.30* <0.30* <0.30*  CALCIUM 9.0 9.1 9.4 9.2 9.3  MG 1.7 1.8  --  1.8  --    ------------------------------------------------------------------------------------------------------------------ CrCl cannot be calculated (This lab value cannot be used to calculate CrCl because it is not a number: <0.30). ------------------------------------------------------------------------------------------------------------------ No results for input(s): HGBA1C in the last 72 hours. ------------------------------------------------------------------------------------------------------------------ No results for input(s): CHOL, HDL, LDLCALC, TRIG,  CHOLHDL, LDLDIRECT in the last 72  hours. ------------------------------------------------------------------------------------------------------------------ No results for input(s): TSH, T4TOTAL, T3FREE, THYROIDAB in the last 72 hours.  Invalid input(s): FREET3 ------------------------------------------------------------------------------------------------------------------ Recent Labs    08/08/17 0508  FERRITIN 176    Coagulation profile No results for input(s): INR, PROTIME in the last 168 hours.  No results for input(s): DDIMER in the last 72 hours.  Cardiac Enzymes No results for input(s): CKMB, TROPONINI, MYOGLOBIN in the last 168 hours.  Invalid input(s): CK ------------------------------------------------------------------------------------------------------------------ Invalid input(s): POCBNP    Assessment & Plan   62 year old female Kayla Boyer with history of chronic respiratory failure, GI bleed currently in the hospital for acute on chronic hypercapnic respiratory failure and anemia secondary to slow GI bleed.  1.Acute on chronic hypercapnic respiratory failure Has tracheostomy on ventilator support with pressure support Wean in psv as tolerated Cont vent bundle Plan for thoracentesis as per ICU attending  2.  Moderate malnutrition   Appreciate GI f/u PEG tube placement today  3. Electrolyte imbalance F/u magnesium and potassium periodically  4.  Acute on chronic gastrointestinal bleeding s/p EGD 3/18 showing Normal duodenal bulb and second portion of the duodenum.Duodenal lipoma. Non-bleeding gastric ulcer with a clean ulcer base (Forrest Class III).Normal gastroesophageal junction and esophagus. - protonix 40 mg via peg tube Hemoglobin stable - 5. DVT prophylaxis with SCD  6.  Constipation, Kayla Boyer started on stool softeners.  7. euthyroid sick syndrome.:  Continue Synthyroid.  8.  Physical therapy for endurance training        Code Status Orders  (From admission,  onward)        Start     Ordered   07/14/17 2115  Full code  Continuous     07/14/17 2122    Code Status History    Date Active Date Inactive Code Status Order ID Comments User Context   06/05/2017 1654 06/08/2017 1820 Full Code [id number]  Bettey Costa, MD Inpatient   11/01/2016 0121 11/09/2016 2044 Full Code [id number]  Lance Coon, MD Inpatient      Time Spent in minutes   35  Greater than 50% of time spent in care coordination and counseling Kayla Boyer regarding the condition and plan of care.   Saundra Shelling M.D on 08/10/2017 at 1:55 PM  Between 7am to 6pm - Pager - [phone]  After 6pm go to [email or web address] - Proofreader  [employer]   Office  [phone]

## 2017-08-10 NOTE — Progress Notes (Signed)
  [facility] Progess Note  CC prolonged ventilator dependence, tracheostomy status  HPI Remains cognitively intact.  In good spirits.  Follows commands briskly.  Good strength in her extremities.  Tolerates pressure support of 15 cm H2O. PEG placed today    VENTILATOR SETTINGS: Vent Mode: PRVC FiO2 (%):  [30 %-40 %] 40 % Set Rate:  [15 bmp] 15 bmp Vt Set:  [450 mL] 450 mL PEEP:  [5 cmH20] 5 cmH20 Pressure Support:  [15 cmH20] 15 cmH20 Plateau Pressure:  [7 PVX48-01 cmH20] 20 cmH20  INTAKE / OUTPUT:  Intake/Output Summary (Last 24 hours) at 08/10/2017 1827 Last data filed at 08/10/2017 1800 Gross per 24 hour  Intake 254.43 ml  Output 1100 ml  Net -845.57 ml    Name: Kayla Boyer MRN: [id number] DOB: [birth year]    ADMISSION DATE:  07/14/2017   VITAL SIGNS: Temp:  [98.4 F (36.9 C)-99.1 F (37.3 C)] 98.6 F (37 C) (04/11 1600) Pulse Rate:  [80-103] 84 (04/11 1800) Resp:  [13-21] 19 (04/11 1800) BP: (88-128)/(47-100) 115/77 (04/11 1800) SpO2:  [93 %-100 %] 100 % (04/11 1800) FiO2 (%):  [30 %-40 %] 40 % (04/11 1600)  PHYSICAL EXAMINATION: Physical Examination:   VS: BP 115/77   Pulse 84   Temp 98.6 F (37 C) (Oral)   Resp 19   Ht 5' 2.5" (1.588 m)   Wt 116 lb 2.9 oz (52.7 kg)   SpO2 100%   BMI 20.91 kg/m   Gen: NAD HEENT: [place], sclerae white Neck: Tracheostomy site clean Lungs: Improved breath sounds on the right, few right basilar crackles, no wheezes Cardiovascular: Regular, no M Abdomen: Soft, NT, NABS Ext: No C/C/E Neuro: No focal deficits     LABORATORY PANEL:   CBC Recent Labs  Lab 08/10/17 0647  WBC 10.6  HGB 9.3*  HCT 29.5*  PLT 304    Chemistries  Recent Labs  Lab 08/07/17 0622 08/10/17 0647  NA 139 139  K 4.0 3.9  CL 100* 101  CO2 33* 32  GLUCOSE 115* 83  BUN 21* 16  CREATININE <0.30* <0.30*  CALCIUM 9.2 9.3  MG 1.8  --   PHOS 4.0  --     Recent Labs  Lab 08/09/17 0735 08/09/17 1138  08/09/17 1758 08/10/17 0016 08/10/17 0531 08/10/17 1151  GLUCAP 104* 97 107* 112* 84 81   CXR; much improved aeration of right lower lobe with minimal residual atelectasis and effusion    ASSESSMENT/PLAN  Prolonged VDRF - little progress weaning Acute on chronic hypercarbic respiratory failure RLL collapse, resolved Tracheostomy status PEG status Recent GIB - resolved Deconditioning  Cont vent support - settings reviewed and/or adjusted Continue to wean in PSV as tolerated Continue airway hygiene with chest percussion, nebulized N-acetylcysteine, frequent suctioning Follow CXR intermittently Continue PPI therapy Continue nutritional support Continue PT/OT Working on placement.  Will need vent SNF versus LTAC   Merton Border, MD PCCM service Mobile [phone] Pager [phone] 08/10/2017 6:27 PM

## 2017-08-10 NOTE — Progress Notes (Addendum)
Per Dr. Marius Ditch PEG may be used today at 1700 for medication. Tube feeding should be resumed tomorrow. Site was cleaned with water and dried. Split gauze was placed over site per Dr. Marius Ditch.

## 2017-08-11 DIAGNOSIS — J9612 Chronic respiratory failure with hypercapnia: Secondary | ICD-10-CM

## 2017-08-11 LAB — CBC WITH DIFFERENTIAL/PLATELET
Basophils Absolute: 0 10*3/uL (ref 0–0.1)
Basophils Relative: 0 %
EOS PCT: 1 %
Eosinophils Absolute: 0.1 10*3/uL (ref 0–0.7)
HCT: 29.8 % — ABNORMAL LOW (ref 35.0–47.0)
HEMOGLOBIN: 9.8 g/dL — AB (ref 12.0–16.0)
LYMPHS PCT: 12 %
Lymphs Abs: 1.1 10*3/uL (ref 1.0–3.6)
MCH: 29.6 pg (ref 26.0–34.0)
MCHC: 32.9 g/dL (ref 32.0–36.0)
MCV: 89.8 fL (ref 80.0–100.0)
MONOS PCT: 6 %
Monocytes Absolute: 0.6 10*3/uL (ref 0.2–0.9)
Neutro Abs: 7.7 10*3/uL — ABNORMAL HIGH (ref 1.4–6.5)
Neutrophils Relative %: 81 %
Platelets: 369 10*3/uL (ref 150–440)
RBC: 3.32 MIL/uL — AB (ref 3.80–5.20)
RDW: 15.3 % — AB (ref 11.5–14.5)
WBC: 9.5 10*3/uL (ref 3.6–11.0)

## 2017-08-11 LAB — GLUCOSE, CAPILLARY
GLUCOSE-CAPILLARY: 80 mg/dL (ref 65–99)
GLUCOSE-CAPILLARY: 72 mg/dL (ref 65–99)
GLUCOSE-CAPILLARY: 96 mg/dL (ref 65–99)
Glucose-Capillary: 111 mg/dL — ABNORMAL HIGH (ref 65–99)

## 2017-08-11 MED ORDER — PRO-STAT SUGAR FREE PO LIQD
30.0000 mL | Freq: Two times a day (BID) | ORAL | Status: DC
Start: 2017-08-11 — End: 2017-08-17
  Administered 2017-08-11 – 2017-08-17 (×12): 30 mL

## 2017-08-11 MED ORDER — STERILE WATER FOR INJECTION IJ SOLN
INTRAMUSCULAR | Status: AC
  Administered 2017-08-11: 10 mL
  Filled 2017-08-11: qty 10

## 2017-08-11 MED ORDER — DEXTROSE-NACL 5-0.9 % IV SOLN
INTRAVENOUS | Status: DC
  Administered 2017-08-11 – 2017-08-14 (×5): via INTRAVENOUS

## 2017-08-11 MED ORDER — DEXTROSE 50 % IV SOLN
INTRAVENOUS | Status: AC
  Administered 2017-08-10: 25 mL via INTRAVENOUS
  Filled 2017-08-11: qty 50

## 2017-08-11 MED ORDER — DEXTROSE 50 % IV SOLN
25.0000 mL | Freq: Once | INTRAVENOUS | Status: AC
  Administered 2017-08-11: 25 mL via INTRAVENOUS
  Filled 2017-08-11: qty 50

## 2017-08-11 MED ORDER — ALTEPLASE 2 MG IJ SOLR
2.0000 mg | Freq: Once | INTRAMUSCULAR | Status: AC
  Administered 2017-08-11: 2 mg
  Filled 2017-08-11: qty 2

## 2017-08-11 NOTE — Progress Notes (Signed)
Patient tolerated 1 hour and 30 min of T.collar trial today

## 2017-08-11 NOTE — Progress Notes (Signed)
Physical Therapy Treatment Patient Details Name: Kayla Boyer MRN: [id number] DOB: [birth year] Today's Date: 08/11/2017    History of Present Illness Kayla Boyer  is a 62 y.o. female with a known history of gastrointestinal bleeding, gastric ulcer, hyperlipidemia, emphysema of lung was referred by ER physician Dr.malinda for weakness and dark stools.  Patient presented to the emergency room with generalized weakness.  She has noticed a dark black stools for the last few days she was recently in our hospital last month and had an endoscopy which showed gastric ulcers she was discharged on oral proton pump inhibitors.  Patient is lethargic and weak.  Has nausea and vomitings.  No complaints of any chest pain, abdominal pain.  No fever and chills.Hemoglobin dropped from 11.7 to 9.7. Stool for occult blood is positive.  Diagnosis of GI bleed and then developed acute resp distress and placed on BIPAP and then required a ventilator. She is currently vent dependent and did not tolerate weaning over the weekend to a lower setting. PEG tube placed 08/10/17 per physician note.    PT Comments    Pt is very fatigued and sore today from procedure yesterday. PT/OT for co-treatment, however pt politely refusing all mobility efforts at this time. Agreeable to there-ex in supine position. Able to demonstrate improvement with there-ex, only needing mod/max assist for performance this date with more muscle contraction noted. Improvement with PF stretch, however still unable to reach neutral foot position. Again, discussed with RN staff about multi podus boot. Pt agreeable to attempt mobility efforts next treatment. Will continue to progress.  Follow Up Recommendations  SNF(vent SNF)     Equipment Recommendations  None recommended by PT    Recommendations for Other Services       Precautions / Restrictions Precautions Precautions: Fall Restrictions Weight Bearing Restrictions: No    Mobility  Bed Mobility                General bed mobility comments: not performed as pt very sore from recent procedure and very fatigued  Transfers                 General transfer comment: unable to perform  Ambulation/Gait                 Stairs             Wheelchair Mobility    Modified Rankin (Stroke Patients Only)       Balance                                            Cognition Arousal/Alertness: Awake/alert Behavior During Therapy: WFL for tasks assessed/performed Overall Cognitive Status: Within Functional Limits for tasks assessed                                 General Comments: Of note, pt writes on clipboard, received larger pen      Exercises Other Exercises Other Exercises: Theraband exercises attempted but then stopped 2/2 to pain in abdominal area from yesterday's PEG tube placement Other Exercises: Theraputty exercises completed and reviewed with increased encouragement of use of L hand due to increased weakness Other Exercises: Added x10 shoulder shrugs 3 times per day to increase proximal stregnth to work toward functional UE usage Other Exercises: PROM completed on LUE to  maintain shoulder mobility for eventual functional UE usage. AAROM completed in flexion and abduction of shoulder to increase strength for future functional participation in bed mobility/ADLs Other Exercises: supine ther-ex performed on B LE including ankle pumps (active-R and AA on L), SLRs, hip abd/add, and hip add squeezes x 15 reps with mod/max assist. Also performed B gastroc stretches for AAROM. L ankle PF in 35 degrees and R ankle PF at 25 degrees    General Comments        Pertinent Vitals/Pain Pain Assessment: No/denies pain    Home Living                      Prior Function            PT Goals (current goals can now be found in the care plan section) Acute Rehab PT Goals Patient Stated Goal: "to get stronger and take  care of myself" PT Goal Formulation: With patient Time For Goal Achievement: 08/25/17 Potential to Achieve Goals: Good Progress towards PT goals: Progressing toward goals    Frequency    Min 2X/week      PT Plan Current plan remains appropriate    Co-evaluation PT/OT/SLP Co-Evaluation/Treatment: Yes Reason for Co-Treatment: Complexity of the patient's impairments (multi-system involvement) PT goals addressed during session: Strengthening/ROM OT goals addressed during session: Strengthening/ROM      AM-PAC PT "6 Clicks" Daily Activity  Outcome Measure  Difficulty turning over in bed (including adjusting bedclothes, sheets and blankets)?: Unable Difficulty moving from lying on back to sitting on the side of the bed? : Unable Difficulty sitting down on and standing up from a chair with arms (e.g., wheelchair, bedside commode, etc,.)?: Unable Help needed moving to and from a bed to chair (including a wheelchair)?: Total Help needed walking in hospital room?: Total Help needed climbing 3-5 steps with a railing? : Total 6 Click Score: 6    End of Session Equipment Utilized During Treatment: (vent) Activity Tolerance: Patient limited by fatigue Patient left: in bed;with bed alarm set Nurse Communication: Mobility status PT Visit Diagnosis: Muscle weakness (generalized) (M62.81);Difficulty in walking, not elsewhere classified (R26.2)     Time: 6803-2122 PT Time Calculation (min) (ACUTE ONLY): 32 min  Charges:  $Therapeutic Exercise: 8-22 mins                    G Codes:       Kayla Boyer, PT, DPT [phone]    Kayla Boyer 08/11/2017, 2:47 PM

## 2017-08-11 NOTE — Progress Notes (Signed)
Occupational Therapy Treatment Patient Details Name: Kayla Boyer MRN: [id number] DOB: 03-24-1956 Today's Date: 08/11/2017    History of present illness Kayla Boyer  is a 62 y.o. female with a known history of gastrointestinal bleeding, gastric ulcer, hyperlipidemia, emphysema of lung was referred by ER physician Dr.malinda for weakness and dark stools.  Patient presented to the emergency room with generalized weakness.  She has noticed a dark black stools for the last few days she was recently in our hospital last month and had an endoscopy which showed gastric ulcers she was discharged on oral proton pump inhibitors.  Patient is lethargic and weak.  Has nausea and vomitings.  No complaints of any chest pain, abdominal pain.  No fever and chills.Hemoglobin dropped from 11.7 to 9.7. Stool for occult blood is positive.  Diagnosis of GI bleed and then developed acute resp distress and placed on BIPAP and then required a ventilator. She is currently vent dependent and did not tolerate weaning over the weekend to a lower setting. PEG tube placed 08/11/17 per physician note.   OT comments  Met pt long sitting in bed, agreeable to bed level therapy. Orginial plan was to trail use of sit to stand lift with pt to begin WB of LEs, but pt politely declined 2/2 to abdominal pain, fasting, and lack of sleep after PEG placement on 08/10/17. Continued bed level therapeutic exercise with theraputty, theraband not used 2/2 to increased abdominal pain with UE movement. Challenged pt with opening theraputty container/pulling out theraputty and reaching for items on table. Weakness in LUE noted more significantly than RUE, so education on continued use of LUE to increase strength. Gave pt continued UE exercises (see below) to help increase proximal strength and musculature for future UE engagement in functional activities.    Follow Up Recommendations  SNF;Other (comment)(vent)    Equipment Recommendations        Recommendations for Other Services      Precautions / Restrictions Precautions Precautions: Fall Restrictions Weight Bearing Restrictions: No       Mobility Bed Mobility                  Transfers                      Balance                                           ADL either performed or assessed with clinical judgement   ADL Overall ADL's : Needs assistance/impaired Eating/Feeding: NPO Eating/Feeding Details (indicate cue type and reason): PEG tube   Grooming Details (indicate cue type and reason): per previous OT tx                                     Vision       Perception     Praxis      Cognition Arousal/Alertness: Awake/alert Behavior During Therapy: WFL for tasks assessed/performed                                            Exercises Other Exercises Other Exercises: Theraband exercises attempted but then stopped 2/2 to pain in abdominal area  from yesterday's PEG tube placement Other Exercises: Theraputty exercises completed and reviewed with increased encouragement of use of L hand due to increased weakness Other Exercises: Added x10 shoulder shrugs 3 times per day to increase proximal stregnth to work toward functional UE usage Other Exercises: PROM completed on LUE to maintain shoulder mobility for eventual functional UE usage. AAROM completed in flexion and abduction of shoulder to increase strength for future functional participation in bed mobility/ADLs   Shoulder Instructions       General Comments      Pertinent Vitals/ Pain       Pain Assessment: No/denies pain  Home Living                                          Prior Functioning/Environment              Frequency  Min 2X/week        Progress Toward Goals  OT Goals(current goals can now be found in the care plan section)     Acute Rehab OT Goals Patient Stated Goal: "to get  stronger and take care of myself" OT Goal Formulation: With patient Time For Goal Achievement: 08/14/17 Potential to Achieve Goals: Fair  Plan      Co-evaluation    PT/OT/SLP Co-Evaluation/Treatment: Yes            AM-PAC PT "6 Clicks" Daily Activity     Outcome Measure   Help from another person eating meals?: Total Help from another person taking care of personal grooming?: A Little Help from another person toileting, which includes using toliet, bedpan, or urinal?: Total Help from another person bathing (including washing, rinsing, drying)?: A Lot Help from another person to put on and taking off regular upper body clothing?: Total Help from another person to put on and taking off regular lower body clothing?: Total 6 Click Score: 9    End of Session    OT Visit Diagnosis: Muscle weakness (generalized) (M62.81);Feeding difficulties (R63.3);Other abnormalities of gait and mobility (R26.89)   Activity Tolerance Patient tolerated treatment well   Patient Left in bed;with call bell/phone within reach   Nurse Communication          Time: 7782-4235 OT Time Calculation (min): 32 min  Charges: OT Treatments $Therapeutic Exercise: 8-22 mins    Zenovia Jarred, MSOT, OTR/L    [place] 08/11/2017, 11:41 AM

## 2017-08-11 NOTE — Progress Notes (Signed)
Kayla Antigua, MD [address] [place], [place], [place], [address] [address], [place], [place], [place], [address] Phone: [phone]  Fax: [phone]   Subjective:  Patient resting in bed comfortably.  Denies any abdominal pain.  Denies any pain at the PEG tube site.  Objective: Exam: Vital signs in last 24 hours: Vitals:   08/11/17 0700 08/11/17 0742 08/11/17 0800 08/11/17 0823  BP: 140/70  130/83   Pulse: 85 96 84   Resp: (!) 21 19 12    Temp:  98.5 F (36.9 C)    TempSrc:  Oral    SpO2: 100% 100% 100% 98%  Weight:      Height:       Weight change:   Intake/Output Summary (Last 24 hours) at 08/11/2017 1111 Last data filed at 08/11/2017 0744 Gross per 24 hour  Intake 451.93 ml  Output 750 ml  Net -298.07 ml    General: No acute distress, AAO x3 Abd: Soft, NT/ND, No HSM, the PEG tube site examined, external bumper at 5-1/2 cm mark, and 1 cm/1 finger length away from the abdominal wall.  Dressing in place above the external bumper.  PEG tube rotated, and moved in and out gently.  No tenderness at site, no discharge at site. Skin: Warm, no rashes Neck: Supple, Trachea midline   Lab Results: Lab Results  Component Value Date   WBC 9.5 08/11/2017   HGB 9.8 (L) 08/11/2017   HCT 29.8 (L) 08/11/2017   MCV 89.8 08/11/2017   PLT 369 08/11/2017   Micro Results: Recent Results (from the past 240 hour(s))  Culture, respiratory (NON-Expectorated)     Status: None   Collection Time: 08/02/17  6:23 PM  Result Value Ref Range Status   Specimen Description   Final    TRACHEAL ASPIRATE Performed at [facility], [address], [place], [place] [address]    Special Requests   Final    NONE Performed at [facility], [address] [place], [place] [address]    Gram Stain   Final    ABUNDANT WBC PRESENT, PREDOMINANTLY PMN FEW GRAM POSITIVE RODS    Culture   Final    Consistent with normal respiratory flora. Performed at [facility], [place] [address] [place], [place] [address]    Report Status 08/05/2017 FINAL  Final  Culture, respiratory (NON-Expectorated)     Status: None   Collection Time: 08/06/17  9:32 PM  Result Value Ref Range Status   Specimen Description   Final    INDUCED SPUTUM Performed at [facility], [address], [place], [place] [address]    Special Requests   Final    NONE Performed at [facility], [address] [place], [place] [address]    Gram Stain   Final    RARE WBC PRESENT, PREDOMINANTLY PMN NO ORGANISMS SEEN    Culture   Final    RARE Consistent with normal respiratory flora. Performed at [facility], [place] [address] [place], [place] [address]    Report Status 08/09/2017 FINAL  Final  Culture, respiratory (NON-Expectorated)     Status: None (Preliminary result)   Collection Time: 08/09/17  3:24 PM  Result Value Ref Range Status   Specimen Description   Final    TRACHEAL ASPIRATE Performed at [facility], [address] [place], [place] [address]    Special Requests   Final    Normal Performed at [facility], [place], [place] [address]    Gram Stain   Final    ABUNDANT WBC PRESENT,BOTH PMN AND MONONUCLEAR NO ORGANISMS SEEN    Culture   Final    CULTURE REINCUBATED FOR BETTER GROWTH Performed at [facility], [place] [address], [place], [place] [address]    Report Status PENDING  Incomplete   Studies/Results: Dg Chest Port 1 View  Result Date: 08/10/2017 CLINICAL DATA:  Respiratory failure EXAM: PORTABLE CHEST 1 VIEW COMPARISON:  Yesterday FINDINGS: Significantly improved aeration on the right. Haziness of the lower chest attributed to bilateral pleural fluid and presumed atelectasis. Normal heart size. Negative mediastinal contours. Right upper extremity PICC with tip at the SVC. An orogastric tube reaches the stomach. Tracheostomy tube. IMPRESSION: 1. Significantly improved aeration on  the right from interval thoracentesis or lobar re-expansion. 2. Hazy opacities at the bases is likely atelectasis and small effusions. Electronically Signed   By: Monte Fantasia M.D.   On: 08/10/2017 07:29   Dg Chest Port 1 View  Result Date: 08/09/2017 CLINICAL DATA:  Respiratory failure EXAM: PORTABLE CHEST 1 VIEW COMPARISON:  Portable chest x-ray of August 06, 2017 FINDINGS: There has been markedly increased volume loss on the right due to increasing pleural fluid. Only approximately 1/4 of the pleural space volume is occupied by aerated lung. There is mild shift of the mediastinum toward the right which is stable. The left lung is mildly hyperinflated. There is a tiny left pleural effusion. The left heart border is normal. The pulmonary vascularity is not engorged. The tracheostomy tube tip lies just above the superior margin of the clavicles. The esophagogastric tube tip in proximal port project below the GE junction. The right PICC line tip projects over the midportion of the SVC. IMPRESSION: Markedly increased volume loss on the right consistent with increasing pleural fluid volume. Electronically Signed   By: David  Martinique M.D.   On: 08/09/2017 12:38   Medications:  Scheduled Meds: . acetylcysteine  3 mL Nebulization TID  . albuterol  2.5 mg Nebulization Q8H  . chlorhexidine  15 mL Mouth Rinse BID  . feeding supplement (PRO-STAT SUGAR FREE 64)  30 mL Per Tube BID  . free water  100 mL Per Tube Q6H  . liver oil-zinc oxide   Topical Daily  . mouth rinse  15 mL Mouth Rinse QID  . mirtazapine  15 mg Per Tube QHS  . pantoprazole sodium  40 mg Per Tube Daily  . senna-docusate  2 tablet Oral BID  . sodium chloride flush  10-40 mL Intracatheter Q12H  . thyroid  30 mg Per Tube QAC breakfast   Continuous Infusions: . sodium chloride    . dextrose 5 % and 0.9% NaCl Stopped (08/11/17 0631)  . feeding supplement (JEVITY 1.2 CAL) Stopped (08/10/17 0000)   PRN Meds:.acetaminophen, ALPRAZolam,  bisacodyl, [DISCONTINUED] ondansetron **OR** ondansetron (ZOFRAN) IV, simethicone, sodium chloride flush   Assessment: PEG tube check   Plan: PEG tube site examined External bumper appropriately 1 cm away from the abdominal wall, at 5-1/2 cm mark at this time PEG tube site and instructions shown to patient's nurse Keep external bumper at 1 cm away from abdominal wall Patient's nurse instructed to, and gently rotate PEG tube and gently move it in and out daily for 1 week with dressing changes Nurse instructed to keep PEG tube site clean with soap and water 1-2 times daily for 1 week Nurse instructed not to  place, any dressing  underneath the bumper.  Okay to place dressing above the external bumper, to keep patient from touching or irritating the PEG tube. Okay to start feeds    LOS: 28 days   Kayla Antigua, MD 08/11/2017, 11:11 AM

## 2017-08-11 NOTE — Progress Notes (Signed)
[place] at [facility]                                                                                                                                                                                  Patient Demographics   Kayla Boyer, is a 62 y.o. female, DOB - [birth year], WJX:[id number]  Admit date - 07/14/2017   Admitting Physician Saundra Shelling, MD  Outpatient Primary MD for the patient is Patient, [name]   LOS - 28  Subjective: Patient seen and evaluated today On tracheostomy with vent support requiring pressure support Communicates by writing on the pad and on verbal commands Had PEG tube placement yesterday Complaints of any abdominal pain   Review of Systems:   CONSTITUTIONAL: No documented fever. No fatigue, weakness. No weight gain, no weight loss.  EYES: No blurry or double vision.  ENT: No tinnitus. No postnasal drip. No redness of the oropharynx.  Has tracheostomy RESPIRATORY: No cough, no wheeze, no hemoptysis. No dyspnea.  CARDIOVASCULAR: No chest pain. No orthopnea. No palpitations. No syncope.  GASTROINTESTINAL: No nausea, no vomiting or diarrhea. No abdominal pain. No melena or hematochezia. GENITOURINARY: No dysuria or hematuria.  ENDOCRINE: No polyuria or nocturia. No heat or cold intolerance.  HEMATOLOGY: No anemia. No bruising. No bleeding.  INTEGUMENTARY: No rashes. No lesions.  MUSCULOSKELETAL: No arthritis. No swelling. No gout.  NEUROLOGIC: No numbness, tingling, or ataxia. No seizure-type activity.  PSYCHIATRIC: No anxiety. No insomnia. No ADD.    Vitals:   Vitals:   08/11/17 0700 08/11/17 0742 08/11/17 0800 08/11/17 0823  BP: 140/70  130/83   Pulse: 85 96 84   Resp: (!) 21 19 12    Temp:  98.5 F (36.9 C)    TempSrc:  Oral    SpO2: 100% 100% 100% 98%  Weight:      Height:        Wt Readings from Last 3 Encounters:  08/09/17 52.7 kg (116 lb 2.9 oz)  07/05/17 60.8 kg (134 lb)  07/04/17 60.8 kg (134 lb)      Intake/Output Summary (Last 24 hours) at 08/11/2017 1137 Last data filed at 08/11/2017 0744 Gross per 24 hour  Intake 451.93 ml  Output 750 ml  Net -298.07 ml    Physical Exam:   GENERAL: Pleasant-appearing female patient lying on the bed with ventilator support via tracheostomy HEAD, EYES, EARS, NOSE AND THROAT: Atraumatic, normocephalic. Extraocular muscles are intact. Pupils equal and reactive to light. Sclerae anicteric. No conjunctival injection. No oro-pharyngeal erythema.  NECK: Supple. There is no jugular venous distention. No bruits, no lymphadenopathy, no thyromegaly.  Has tracheostomy, Has NGT for feeds HEART: Regular  rate and rhythm,. No murmurs, no rubs, no clicks.  LUNGS: Clear to auscultation bilaterally. No rales or rhonchi. No wheezes.  Has tracheostomy with ventilator support Tidal volume 450 rate 15 PEEP 5 FiO2 30% ABDOMEN: Soft, flat, nontender, nondistended. Has good bowel sounds. No hepatosplenomegaly appreciated.  Has PEG tube EXTREMITIES: No evidence of any cyanosis, clubbing, or peripheral edema.  +2 pedal and radial pulses bilaterally.  NEUROLOGIC: The patient is alert, awake, and oriented x3 with no focal motor or sensory deficits appreciated bilaterally.  SKIN: Moist and warm with no rashes appreciated.  Psych: Not anxious, depressed LN: No inguinal LN enlargement    Antibiotics   Anti-infectives (From admission, onward)   Start     Dose/Rate Route Frequency Ordered Stop   08/10/17 1300  ceFAZolin (ANCEF) IVPB 2g/100 mL premix     2 g 200 mL/hr over 30 Minutes Intravenous Once 08/10/17 1257 08/10/17 1344   08/04/17 2200  ceFEPIme (MAXIPIME) 1 g in sodium chloride 0.9 % 100 mL IVPB  Status:  Discontinued     1 g 200 mL/hr over 30 Minutes Intravenous Every 8 hours 08/04/17 1218 08/04/17 1448   08/04/17 2200  ceFEPIme (MAXIPIME) 1 g in sodium chloride 0.9 % 100 mL IVPB  Status:  Discontinued     1 g 200 mL/hr over 30 Minutes Intravenous Every 8  hours 08/04/17 1448 08/07/17 1119   08/03/17 0100  piperacillin-tazobactam (ZOSYN) IVPB 3.375 g  Status:  Discontinued     3.375 g 12.5 mL/hr over 240 Minutes Intravenous Every 8 hours 08/02/17 1701 08/02/17 1704   08/02/17 1730  piperacillin-tazobactam (ZOSYN) IVPB 3.375 g  Status:  Discontinued     3.375 g 12.5 mL/hr over 240 Minutes Intravenous Every 8 hours 08/02/17 1704 08/04/17 1217   08/02/17 1645  piperacillin-tazobactam (ZOSYN) IVPB 3.375 g  Status:  Discontinued     3.375 g 12.5 mL/hr over 240 Minutes Intravenous Every 8 hours 08/02/17 1639 08/02/17 1701   07/14/17 2200  azithromycin (ZITHROMAX) 500 mg in sodium chloride 0.9 % 250 mL IVPB  Status:  Discontinued     500 mg 250 mL/hr over 60 Minutes Intravenous Every 24 hours 07/14/17 2122 07/18/17 0915      Medications   Scheduled Meds: . acetylcysteine  3 mL Nebulization TID  . albuterol  2.5 mg Nebulization Q8H  . chlorhexidine  15 mL Mouth Rinse BID  . feeding supplement (PRO-STAT SUGAR FREE 64)  30 mL Per Tube BID  . free water  100 mL Per Tube Q6H  . liver oil-zinc oxide   Topical Daily  . mouth rinse  15 mL Mouth Rinse QID  . mirtazapine  15 mg Per Tube QHS  . pantoprazole sodium  40 mg Per Tube Daily  . senna-docusate  2 tablet Oral BID  . sodium chloride flush  10-40 mL Intracatheter Q12H  . thyroid  30 mg Per Tube QAC breakfast   Continuous Infusions: . sodium chloride    . dextrose 5 % and 0.9% NaCl Stopped (08/11/17 0631)  . feeding supplement (JEVITY 1.2 CAL) Stopped (08/10/17 0000)   PRN Meds:.acetaminophen, ALPRAZolam, bisacodyl, [DISCONTINUED] ondansetron **OR** ondansetron (ZOFRAN) IV, simethicone, sodium chloride flush   Data Review:   Micro Results Recent Results (from the past 240 hour(s))  Culture, respiratory (NON-Expectorated)     Status: None   Collection Time: 08/02/17  6:23 PM  Result Value Ref Range Status   Specimen Description   Final    TRACHEAL ASPIRATE Performed at  [facility], [address], [place], [place] [address]    Special Requests   Final    NONE Performed at [facility], [address] [place], [place] [address]    Gram Stain   Final    ABUNDANT WBC PRESENT, PREDOMINANTLY PMN FEW GRAM POSITIVE RODS    Culture   Final    Consistent with normal respiratory flora. Performed at [facility], [place] [address] [place], [place] [address]    Report Status 08/05/2017 FINAL  Final  Culture, respiratory (NON-Expectorated)     Status: None   Collection Time: 08/06/17  9:32 PM  Result Value Ref Range Status   Specimen Description   Final    INDUCED SPUTUM Performed at [facility], [address], [place], [place] [address]    Special Requests   Final    NONE Performed at [facility], [address] [place], [place] [address]    Gram Stain   Final    RARE WBC PRESENT, PREDOMINANTLY PMN NO ORGANISMS SEEN    Culture   Final    RARE Consistent with normal respiratory flora. Performed at [facility], [place] [address] [place], [place] [address]    Report Status 08/09/2017 FINAL  Final  Culture, respiratory (NON-Expectorated)     Status: None (Preliminary result)   Collection Time: 08/09/17  3:24 PM  Result Value Ref Range Status   Specimen Description   Final    TRACHEAL ASPIRATE Performed at [facility], [address] [place], [place] [address]    Special Requests   Final    Normal Performed at [facility], [address] [place], [place] [address]    Gram Stain   Final    ABUNDANT WBC PRESENT,BOTH PMN AND MONONUCLEAR NO ORGANISMS SEEN    Culture   Final    CULTURE REINCUBATED FOR BETTER GROWTH Performed at [facility], [place] [address] [place], [place] [address]    Report Status PENDING  Incomplete    Radiology Reports Dg Abd 1 View  Result Date: 07/31/2017 CLINICAL DATA:  Nasogastric tube placement. EXAM: ABDOMEN - 1 VIEW COMPARISON:  Radiograph  July 22, 2017. FINDINGS: The bowel gas pattern is normal. Distal tip of feeding tube is seen in distal stomach. No radio-opaque calculi or other significant radiographic abnormality are seen. IMPRESSION: Distal tip of nasogastric tube seen in distal stomach. No evidence of bowel obstruction or ileus. Electronically Signed   By: Marijo Conception, M.D.   On: 07/31/2017 11:47   Dg Abd 1 View  Result Date: 07/22/2017 CLINICAL DATA:  Encounter for feeding tube placement. EXAM: ABDOMEN - 1 VIEW COMPARISON:  Chest radiograph 07/22/2017 FINDINGS: A nasogastric tube has been advanced into the abdomen. Catheter tip is in the distal stomach region. Bowel gas throughout the abdomen. Persistent densities at the left lung base and the left hemidiaphragm remains obscured. IMPRESSION: Feeding tube tip in the distal stomach region. Electronically Signed   By: Markus Daft M.D.   On: 07/22/2017 16:30   Ct Head Wo Contrast  Result Date: 07/23/2017 CLINICAL DATA:  62 y/o F; changes to left pupil in strength of left arm. EXAM: CT HEAD WITHOUT CONTRAST TECHNIQUE: Contiguous axial images were obtained from the base of the skull through the vertex without intravenous contrast. COMPARISON:  None. FINDINGS: Brain: No evidence of acute infarction, hemorrhage, hydrocephalus, extra-axial collection or mass lesion/mass effect. Vascular: Calcific atherosclerosis of carotid siphons.  No hyperdense vessel. Skull: Normal. Negative for fracture or focal lesion. Sinuses/Orbits: No acute finding. Other: None. IMPRESSION: Negative CT of the head. Electronically Signed   By: Kristine Garbe M.D.   On: 07/23/2017 14:33   Nm Gi Blood Loss  Result Date: 07/20/2017 CLINICAL DATA:  GI bleeding EXAM: NUCLEAR MEDICINE GASTROINTESTINAL BLEEDING SCAN TECHNIQUE: Sequential abdominal images were obtained following intravenous administration of Tc-45m labeled red blood cells. RADIOPHARMACEUTICALS:  Twenty mCi Tc-100m pertechnetate in-vitro labeled  red cells. COMPARISON:  None. FINDINGS: Normal distribution of radiotracer is noted. No focal area of tracer accumulation is identified to suggest active GI bleeding is noted. IMPRESSION: No findings to suggest active GI hemorrhage. Electronically Signed   By: Inez Catalina M.D.   On: 07/20/2017 15:16   US Venous Img Upper Bilat  Result Date: 07/16/2017 CLINICAL DATA:  Bilateral upper extremity edema acutely with pain and discomfort for 1 day EXAM: BILATERAL UPPER EXTREMITY VENOUS DOPPLER ULTRASOUND TECHNIQUE: Gray-scale sonography with graded compression, as well as color Doppler and duplex ultrasound were performed to evaluate the bilateral upper extremity deep venous systems from the level of the subclavian vein and including the jugular, axillary, basilic, radial, ulnar and upper cephalic vein. Spectral Doppler was utilized to evaluate flow at rest and with distal augmentation maneuvers. COMPARISON:  None. FINDINGS: Exam is limited because of upper extremity subcutaneous edema. RIGHT UPPER EXTREMITY Internal Jugular Vein: No evidence of thrombus. Normal compressibility, respiratory phasicity and response to augmentation. Subclavian Vein: No evidence of thrombus. Normal compressibility, respiratory phasicity and response to augmentation. Axillary Vein: No evidence of thrombus. Normal compressibility, respiratory phasicity and response to augmentation. Cephalic Vein: No evidence of thrombus. Normal compressibility, respiratory phasicity and response to augmentation. Basilic Vein: Not well visualized to accurately assess. Brachial Veins: No evidence of thrombus. Normal compressibility, respiratory phasicity and response to augmentation. Radial Veins: No evidence of thrombus. Normal compressibility, respiratory phasicity and response to augmentation. Ulnar Veins: No evidence of thrombus. Normal compressibility, respiratory phasicity and response to augmentation. Venous Reflux:  None. Other Findings:  Peripheral  edema noted LEFT UPPER EXTREMITY Internal Jugular Vein: No evidence of thrombus. Normal compressibility, respiratory phasicity and response to augmentation. Subclavian Vein: No evidence of thrombus. Normal compressibility, respiratory phasicity and response to augmentation. Axillary Vein: No evidence of thrombus. Normal compressibility, respiratory phasicity and response to augmentation. Cephalic Vein: No evidence of thrombus. Normal compressibility, respiratory phasicity and response to augmentation. Basilic Vein: No evidence of thrombus. Normal compressibility, respiratory phasicity and response to augmentation. Brachial Veins: No evidence of thrombus. Normal compressibility, respiratory phasicity and response to augmentation. Radial Veins: No evidence of thrombus. Normal compressibility, respiratory phasicity and response to augmentation. Ulnar Veins: No evidence of thrombus. Normal compressibility, respiratory phasicity and response to augmentation. Venous Reflux:  None. Other Findings:  But peripheral subcutaneous edema noted. IMPRESSION: Limited exam but no gross occlusive upper extremity DVT in either arm. Electronically Signed   By: Jerilynn Mages.  Shick M.D.   On: 07/16/2017 12:34   US Venous Img Upper Uni Left  Result Date: 07/25/2017 CLINICAL DATA:  Left upper extremity edema. EXAM: LEFT UPPER EXTREMITY VENOUS DOPPLER ULTRASOUND TECHNIQUE: Gray-scale sonography with graded compression, as well as color Doppler and duplex ultrasound were performed to evaluate the upper extremity deep venous system from the level of the subclavian vein and including the jugular, axillary, basilic, radial, ulnar and upper cephalic vein. Spectral Doppler was utilized to evaluate flow at rest and with distal augmentation maneuvers. COMPARISON:  None. FINDINGS: Contralateral Subclavian  Vein: Respiratory phasicity is normal and symmetric with the symptomatic side. No evidence of thrombus. Normal compressibility. Internal Jugular Vein:  No evidence of thrombus. Normal compressibility, respiratory phasicity and response to augmentation. Subclavian Vein: No evidence of thrombus. Normal compressibility, respiratory phasicity and response to augmentation. Axillary Vein: No evidence of thrombus. Normal compressibility, respiratory phasicity and response to augmentation. Cephalic Vein: No evidence of thrombus. Normal compressibility, respiratory phasicity and response to augmentation. Basilic Vein: No evidence of thrombus. Normal compressibility, respiratory phasicity and response to augmentation. Brachial Veins: No evidence of thrombus. Normal compressibility, respiratory phasicity and response to augmentation. Radial Veins: No evidence of thrombus. Normal compressibility, respiratory phasicity and response to augmentation. Ulnar Veins: No evidence of thrombus. Normal compressibility, respiratory phasicity and response to augmentation. Venous Reflux:  None visualized. Other Findings: There is significant edema noted in the left upper arm including some more locally marginated fluid in the subcutaneous tissues of the left medial upper arm. This fluid appears to extend over a long distance and likely represents subcutaneous fluid which is more prominent in 1 area of the upper arm. If there is any concern for soft tissue infection, additional imaging may be warranted. IMPRESSION: 1. No evidence of left upper extremity deep venous thrombosis. 2. Significant subcutaneous fluid in the left upper arm with an area of more marginated subcutaneous fluid also identified in the medial left upper arm. Although this does not have the appearance of a focal abscess by ultrasound, if there is concern for upper extremity infection, additional imaging may be warranted with CT and/or MRI. Electronically Signed   By: Aletta Edouard M.D.   On: 07/25/2017 17:48   Dg Chest Port 1 View  Result Date: 08/10/2017 CLINICAL DATA:  Respiratory failure EXAM: PORTABLE CHEST 1 VIEW  COMPARISON:  Yesterday FINDINGS: Significantly improved aeration on the right. Haziness of the lower chest attributed to bilateral pleural fluid and presumed atelectasis. Normal heart size. Negative mediastinal contours. Right upper extremity PICC with tip at the SVC. An orogastric tube reaches the stomach. Tracheostomy tube. IMPRESSION: 1. Significantly improved aeration on the right from interval thoracentesis or lobar re-expansion. 2. Hazy opacities at the bases is likely atelectasis and small effusions. Electronically Signed   By: Monte Fantasia M.D.   On: 08/10/2017 07:29   Dg Chest Port 1 View  Result Date: 08/09/2017 CLINICAL DATA:  Respiratory failure EXAM: PORTABLE CHEST 1 VIEW COMPARISON:  Portable chest x-ray of August 06, 2017 FINDINGS: There has been markedly increased volume loss on the right due to increasing pleural fluid. Only approximately 1/4 of the pleural space volume is occupied by aerated lung. There is mild shift of the mediastinum toward the right which is stable. The left lung is mildly hyperinflated. There is a tiny left pleural effusion. The left heart border is normal. The pulmonary vascularity is not engorged. The tracheostomy tube tip lies just above the superior margin of the clavicles. The esophagogastric tube tip in proximal port project below the GE junction. The right PICC line tip projects over the midportion of the SVC. IMPRESSION: Markedly increased volume loss on the right consistent with increasing pleural fluid volume. Electronically Signed   By: David  Martinique M.D.   On: 08/09/2017 12:38   Dg Chest Port 1 View  Result Date: 08/06/2017 CLINICAL DATA:  Chronic ventilator dependent respiratory failure. Follow-up RIGHT lower lobe atelectasis and/or pneumonia. EXAM: PORTABLE CHEST 1 VIEW COMPARISON:  08/05/2017, 08/04/2017 and earlier. FINDINGS: Tracheostomy tube tip in satisfactory position projecting below the  thoracic inlet. RIGHT arm PICC tip projects over the LOWER  SVC. Nasogastric tube courses below the diaphragm into the stomach. Cardiac silhouette upper normal in size for AP portable technique, unchanged. Dense airspace consolidation in the RIGHT lower lobe, unchanged, possibly associated with a RIGHT pleural effusion. LEFT lung clear, without evidence of recurrent LEFT lower lobe atelectasis as noted on the examination 2 days ago. No new pulmonary parenchymal abnormalities in either lung. IMPRESSION: 1.  Support apparatus satisfactory. 2. Stable dense RIGHT lower lobe atelectasis and/or pneumonia and possible associated RIGHT pleural effusion. 3. No new abnormalities. Electronically Signed   By: Evangeline Dakin M.D.   On: 08/06/2017 08:43   Dg Chest Port 1 View  Result Date: 08/05/2017 CLINICAL DATA:  Acute respiratory failure EXAM: PORTABLE CHEST 1 VIEW COMPARISON:  08/04/2017 FINDINGS: Tracheostomy in good position. Right arm PICC tip in the mid SVC. NG tube in the stomach. Progression of right lower lobe atelectasis/infiltrate. Mild left lower lobe atelectasis slightly improved. Small right effusion. Negative for edema. IMPRESSION: Mild progression of right lower lobe airspace disease. Mild improvement in left lower lobe atelectasis. Electronically Signed   By: Franchot Gallo M.D.   On: 08/05/2017 07:33   Dg Chest Port 1 View  Result Date: 08/04/2017 CLINICAL DATA:  Pneumonia EXAM: PORTABLE CHEST 1 VIEW COMPARISON:  08/03/2017, 08/02/2017, 07/30/2017 FINDINGS: Tracheostomy tube is in place. Worsening airspace disease at the right lung base. No change in dense left lung base consolidation. Probable small effusions. Stable enlarged cardiomediastinal silhouette. IMPRESSION: 1. Worsening airspace disease at the right lung base may reflect worsening atelectasis or pneumonia 2. No change in dense left lower lobe atelectasis or pneumonia. Probable small pleural effusions. Electronically Signed   By: Donavan Foil M.D.   On: 08/04/2017 03:58   Dg Chest Port 1  View  Result Date: 08/03/2017 CLINICAL DATA:  Pneumonia EXAM: PORTABLE CHEST 1 VIEW COMPARISON:  08/02/2017 FINDINGS: Tracheostomy in good position. Right arm PICC tip in the SVC. NG tube in the stomach. Bibasilar airspace disease unchanged. Small left effusion. Negative for edema. IMPRESSION: Bibasilar atelectasis/infiltrate unchanged. Support lines remain in good position. Electronically Signed   By: Franchot Gallo M.D.   On: 08/03/2017 07:14   Dg Chest Port 1 View  Result Date: 08/02/2017 CLINICAL DATA:  Shortness of Breath EXAM: PORTABLE CHEST 1 VIEW COMPARISON:  August 01, 2017 FINDINGS: Tracheostomy catheter tip is 5.0 cm above the carina. Central catheter tip is in the superior vena cava. Nasogastric tube tip and side port are below the diaphragm. No pneumothorax. There is airspace consolidation left lower lobe with small left pleural effusion. There is mild right base atelectasis. There is cardiomegaly with pulmonary vascularity within normal limits. No adenopathy. No bone lesions. IMPRESSION: Tube and catheter positions as described without evident pneumothorax. Airspace consolidation consistent with pneumonia left lower lobe with small left pleural effusion. Mild right base atelectasis. Stable cardiac prominence. Electronically Signed   By: Lowella Grip III M.D.   On: 08/02/2017 07:55   Dg Chest Port 1 View  Result Date: 08/01/2017 CLINICAL DATA:  Pneumonia EXAM: PORTABLE CHEST 1 VIEW COMPARISON:  07/30/2017, 07/29/2017, 07/27/2017, 07/23/2017 FINDINGS: Tracheostomy tube remains in place. Esophageal tube tip is below the diaphragm but non included. Interval opacification of the right diaphragm. No change in dense left lung base consolidation. Probable tiny pleural effusions. Enlarged cardiomediastinal silhouette is unchanged. Right upper extremity catheter tip overlies the SVC. IMPRESSION: 1. Increased opacity at the right base may reflect atelectasis 2.  No significant change in probable small  left effusion and dense left lung base consolidation, atelectasis versus pneumonia. 3. Stable cardiomegaly Electronically Signed   By: Donavan Foil M.D.   On: 08/01/2017 02:56   Dg Chest Port 1 View  Result Date: 07/30/2017 CLINICAL DATA:  Pneumonia EXAM: PORTABLE CHEST 1 VIEW COMPARISON:  07/29/2017 and prior radiographs FINDINGS: A tracheostomy tube, RIGHT PICC line with tip overlying the mid-LOWER SVC and endotracheal tube with tip overlying the distal esophagus again noted. Decreased LEFT LOWER lung consolidation/atelectasis noted. Mild RIGHT basilar atelectasis again noted. There is no evidence of pneumothorax. No other interval changes identified. IMPRESSION: 1. Decreased LEFT LOWER lung consolidation/atelectasis. 2. NG tube with tip overlying the distal esophagus-recommend advancement. Electronically Signed   By: Margarette Canada M.D.   On: 07/30/2017 07:49   Dg Chest Port 1 View  Result Date: 07/29/2017 CLINICAL DATA:  Acute on chronic respiratory failure. EXAM: PORTABLE CHEST 1 VIEW COMPARISON:  Radiograph July 27, 2017. FINDINGS: Stable cardiomegaly. Tracheostomy is in grossly good position. Distal tip of nasogastric tube is seen in distal esophagus. No pneumothorax is noted. Right-sided PICC line is noted with distal tip in expected position of SVC. Mild bibasilar subsegmental atelectasis is noted. Bony thorax is unremarkable. IMPRESSION: Mild bibasilar subsegmental atelectasis. Endotracheal tube in grossly good position. Distal tip of nasogastric tube has withdrawn into distal esophagus. Electronically Signed   By: Marijo Conception, M.D.   On: 07/29/2017 22:01   Dg Chest Port 1 View  Result Date: 07/27/2017 CLINICAL DATA:  Excessive fluid EXAM: PORTABLE CHEST 1 VIEW COMPARISON:  Chest x-ray of July 23, 2017 FINDINGS: The lungs are well-expanded. There is a trace of pleural fluid on the right and slightly more on the left. The retrocardiac region remains dense. The cardiac silhouette is enlarged.  The central pulmonary vascularity is prominent. The mediastinum is normal in width. The trachea is midline. The right-sided PICC line tip projects over the midportion of the SVC. The tracheostomy tube tip projects at the superior margin of the clavicular heads. The bony thorax exhibits no acute abnormality. IMPRESSION: Slight improved aeration of both lungs today. Small amounts of pleural fluid at both bases. Persistent atelectasis or pneumonia in the left lower lobe. Top-normal cardiac size with central pulmonary vascular congestion but no definite pulmonary edema. Electronically Signed   By: David  Martinique M.D.   On: 07/27/2017 10:55   Dg Chest Port 1 View  Result Date: 07/23/2017 CLINICAL DATA:  Acute on chronic respiratory failure. Airway aspiration. EXAM: PORTABLE CHEST 1 VIEW COMPARISON:  07/22/2017. FINDINGS: Interval enlargement of the cardiac silhouette. Stable dense left lower lobe airspace opacity. Interval increased linear and patchy density at the right lung base. Stable small bilateral pleural effusions. Nasogastric tube extending into the stomach. Thoracic spine degenerative changes. IMPRESSION: 1. No significant change in dense left lower lobe atelectasis or pneumonia and small left pleural effusion. 2. Increased right basilar atelectasis or pneumonia with a stable small right pleural effusion. Electronically Signed   By: Claudie Revering M.D.   On: 07/23/2017 16:09   Dg Chest Port 1 View  Result Date: 07/22/2017 CLINICAL DATA:  Respiratory failure.  COPD. EXAM: PORTABLE CHEST 1 VIEW COMPARISON:  07/19/2017. FINDINGS: The cardiac silhouette remains borderline enlarged. Stable left pleural effusion and left basilar airspace opacity. Small right pleural effusion, decreased, with decreased adjacent right basilar atelectasis. Thoracic spine degenerative changes. IMPRESSION: 1. Stable left pleural effusion and left lower lobe atelectasis or pneumonia. 2. Decreased right  pleural fluid and right basilar  atelectasis. Electronically Signed   By: Claudie Revering M.D.   On: 07/22/2017 09:14   Dg Chest Port 1 View  Result Date: 07/19/2017 CLINICAL DATA:  Respiratory failure EXAM: PORTABLE CHEST 1 VIEW COMPARISON:  July 15, 2017 FINDINGS: There are small pleural effusions bilaterally. There is consolidation in the left lower lobe. Lungs elsewhere are clear. Heart size is upper normal with pulmonary vascularity within normal limits. No adenopathy. There is degenerative change in the thoracic spine. IMPRESSION: Left lower lobe consolidation with small pleural effusions bilaterally. There is mild bibasilar atelectasis. Stable cardiac silhouette. Electronically Signed   By: Lowella Grip III M.D.   On: 07/19/2017 07:57   Dg Chest Port 1 View  Result Date: 07/15/2017 CLINICAL DATA:  Acute respiratory failure. EXAM: PORTABLE CHEST 1 VIEW COMPARISON:  Chest x-ray from yesterday FINDINGS: The heart size and mediastinal contours are within normal limits. Normal pulmonary vascularity. Unchanged left greater than right bibasilar atelectasis and scarring at the costophrenic angles. No focal consolidation, pleural effusion, or pneumothorax. No acute osseous abnormality. IMPRESSION: Stable bibasilar atelectasis/scarring. Electronically Signed   By: Titus Dubin M.D.   On: 07/15/2017 10:41   Dg Chest Portable 1 View  Result Date: 07/14/2017 CLINICAL DATA:  Shortness of breath. EXAM: PORTABLE CHEST 1 VIEW COMPARISON:  06/05/2017. FINDINGS: Mediastinum and hilar structures normal. Stable mild cardiomegaly with normal pulmonary vascularity. Low lung volumes with mild basilar atelectasis. Stable bilateral pleural thickening consistent scarring. Degenerative changes thoracic spine. IMPRESSION: Low lung volumes with mild basilar atelectasis and/or pleuroparenchymal scarring again noted. Stable mild cardiomegaly. Chest is stable from prior exam. Electronically Signed   By: Marcello Moores  Register   On: 07/14/2017 17:13   Dg Abd  Portable 1v  Result Date: 08/04/2017 CLINICAL DATA:  Nasogastric placement. EXAM: PORTABLE ABDOMEN - 1 VIEW COMPARISON:  07/31/2017 FINDINGS: Nasogastric tube enters the stomach, curves along the greater curvature and has its tip in the antrum. Moderate amount of intestinal gas is noted without obstructive pattern. IMPRESSION: Nasogastric tube tip in the gastric antrum. Electronically Signed   By: Nelson Chimes M.D.   On: 08/04/2017 19:23   [place] Ekg Site Rite  Result Date: 07/25/2017 If Site Rite image not attached, placement could not be confirmed due to current cardiac rhythm.    CBC Recent Labs  Lab 08/05/17 0754 08/10/17 0647 08/11/17 0559  WBC 5.7 10.6 9.5  HGB 9.0* 9.3* 9.8*  HCT 28.6* 29.5* 29.8*  PLT 270 304 369  MCV 91.6 90.9 89.8  MCH 28.9 28.5 29.6  MCHC 31.5* 31.3* 32.9  RDW 15.7* 15.9* 15.3*  LYMPHSABS  --   --  1.1  MONOABS  --   --  0.6  EOSABS  --   --  0.1  BASOSABS  --   --  0.0    Chemistries  Recent Labs  Lab 08/05/17 0754 08/06/17 0515 08/07/17 0622 08/10/17 0647  NA 136 138 139 139  K 4.3 3.6 4.0 3.9  CL 99* 97* 100* 101  CO2 30 33* 33* 32  GLUCOSE 99 99 115* 83  BUN 16 18 21* 16  CREATININE <0.30* <0.30* <0.30* <0.30*  CALCIUM 9.1 9.4 9.2 9.3  MG 1.8  --  1.8  --    ------------------------------------------------------------------------------------------------------------------ CrCl cannot be calculated (This lab value cannot be used to calculate CrCl because it is not a number: <0.30). ------------------------------------------------------------------------------------------------------------------ No results for input(s): HGBA1C in the last 72 hours. ------------------------------------------------------------------------------------------------------------------ No results for input(s): CHOL,  HDL, LDLCALC, TRIG, CHOLHDL, LDLDIRECT in the last 72  hours. ------------------------------------------------------------------------------------------------------------------ No results for input(s): TSH, T4TOTAL, T3FREE, THYROIDAB in the last 72 hours.  Invalid input(s): FREET3 ------------------------------------------------------------------------------------------------------------------ No results for input(s): VITAMINB12, FOLATE, FERRITIN, TIBC, IRON, RETICCTPCT in the last 72 hours.  Coagulation profile No results for input(s): INR, PROTIME in the last 168 hours.  No results for input(s): DDIMER in the last 72 hours.  Cardiac Enzymes No results for input(s): CKMB, TROPONINI, MYOGLOBIN in the last 168 hours.  Invalid input(s): CK ------------------------------------------------------------------------------------------------------------------ Invalid input(s): POCBNP    Assessment & Plan   62 year old female patient with history of chronic respiratory failure, GI bleed currently in the hospital for acute on chronic hypercapnic respiratory failure and anemia secondary to slow GI bleed.  1.Acute on chronic hypercapnic respiratory failure Has tracheostomy on ventilator support with pressure support Wean in psv as tolerated Cont vent bundle with adjustments as per critical care attending   2.  Moderate malnutrition   Appreciate GI f/u Status post PEG tube placement PEG tube feeding  3. Electrolyte imbalance F/u magnesium and potassium periodically  4.  Acute on chronic gastrointestinal bleeding s/p EGD 3/18 showing Normal duodenal bulb and second portion of the duodenum.Duodenal lipoma. Non-bleeding gastric ulcer with a clean ulcer base (Forrest Class III).Normal gastroesophageal junction and esophagus. - protonix 40 mg via peg tube Hemoglobin stable - 5. DVT prophylaxis with SCD  6.  Constipation, patient started on stool softeners.  7. euthyroid sick syndrome.:  Continue Synthyroid.  8.  Physical therapy  for endurance training  9.  Disposition Awaiting placement Vent SNF versus LTAC placement        Code Status Orders  (From admission, onward)        Start     Ordered   07/14/17 2115  Full code  Continuous     07/14/17 2122    Code Status History    Date Active Date Inactive Code Status Order ID Comments User Context   06/05/2017 1654 06/08/2017 1820 Full Code [id number]  Bettey Costa, MD Inpatient   11/01/2016 0121 11/09/2016 2044 Full Code [id number]  Lance Coon, MD Inpatient      Time Spent in minutes   35  Greater than 50% of time spent in care coordination and counseling patient regarding the condition and plan of care.   Saundra Shelling M.D on 08/11/2017 at 11:37 AM  Between 7am to 6pm - Pager - [phone]  After 6pm go to [email or web address] - Proofreader  [employer]   Office  [phone]

## 2017-08-11 NOTE — Progress Notes (Signed)
Nutrition Follow-up  DOCUMENTATION CODES:   Not applicable  INTERVENTION:  Initiate new goal TF regimen of Jevity 1.2 at 50 mL/hr (1200 mL goal daily volume) + Pro-Stat 30 mL BID via PEG. Provides 1640 kcal, 97 grams of protein, 22 grams of fiber, 972 mL H2O daily.  Utilize PEPuP protocol when tube feeds are held.  With free water flush of 100 mL Q6hrs patient is receiving a total of 1372 mL H2O daily, which is within estimated fluid needs.  NUTRITION DIAGNOSIS:   Increased nutrient needs related to catabolic illness(acute exacerbation of COPD) as evidenced by estimated needs. -ongoing  GOAL:   Patient will meet greater than or equal to 90% of their needs -met with TFs previously  MONITOR:   PO intake, Labs, Weight trends, TF tolerance, I & O's  REASON FOR ASSESSMENT:   Malnutrition Screening Tool, Consult Enteral/tube feeding initiation and management  ASSESSMENT:   62 year old female with PMHx of HLD, GI bleed, emphysema of lung, COPD who was admitted 3/15 with acute blood loss anemia, acute encephalopathy, and acute on chronic hypercapnic respiratory failure secondary to acute exacerbation of COPD requiring BiPAP. On 3/17 she was able to transfer to [facility] unit. She underwent EGD on 3/18 and a rapid response was called due to respiratory failure likely due to sedating medications once again requiring BiPAP.   Patient is currently intubated on ventilator support MV: 5.2 L/min Temp (24hrs), Avg:98.7 F (37.1 C), Min:98.4 F (36.9 C), Max:99.1 F (37.3 C)  Trached currently on vent - pressure support. PEG tube placed 4/11, ok'd for use. Patient has no complaints. She was previously receiving Vital High protein at 85m/hr  Labs reviewed Medications reviewed and include:  Remeron Senokot-S  Diet Order:  Diet NPO time specified  EDUCATION NEEDS:   Not appropriate for education at this time  Skin:  Skin Assessment: Skin Integrity Issues: Skin Integrity  Issues:: Incisions, Stage II, Other (Comment) Stage II: coccyx Incisions: closed incision to neck Other: MSAD to sacrum; scattered ecchymosis  Last BM:  08/08/2017  Height:   Ht Readings from Last 1 Encounters:  07/14/17 5' 2.5" (1.588 m)    Weight:   Wt Readings from Last 1 Encounters:  08/09/17 116 lb 2.9 oz (52.7 kg)    Ideal Body Weight:  51.1 kg  BMI:  Body mass index is 20.91 kg/m.  Estimated Nutritional Needs:   Kcal:  1320-1600 (25-30 kcal/kg)  Protein:  80-95 grams (1.5-1.8 grams/kg)  Fluid:  1.3-1.6 L/day (25-30 mL/kg)  WSatira Anis Izea Livolsi, MS, RD LDN Inpatient Clinical Dietitian Pager [phone]

## 2017-08-11 NOTE — Progress Notes (Signed)
  [facility] Progess Note  CC prolonged ventilator dependence, tracheostomy status  HPI Remains cognitively intact.  In good spirits.  Follows commands briskly.  Good strength in her extremities.  Tolerates pressure support of 15 cm H2O. PEG placed today    VENTILATOR SETTINGS: Vent Mode: PSV FiO2 (%):  [30 %-40 %] 40 % Set Rate:  [15 bmp] 15 bmp Vt Set:  [450 mL] 450 mL PEEP:  [5 cmH20] 5 cmH20 Pressure Support:  [15 cmH20] 15 cmH20 Plateau Pressure:  [18 RUE45-40 cmH20] 24 cmH20  INTAKE / OUTPUT:  Intake/Output Summary (Last 24 hours) at 08/11/2017 1323 Last data filed at 08/11/2017 0744 Gross per 24 hour  Intake 451.93 ml  Output 750 ml  Net -298.07 ml    Name: Kayla Boyer MRN: [id number] DOB: [birth year]    ADMISSION DATE:  07/14/2017   VITAL SIGNS: Temp:  [98.4 F (36.9 C)-99.1 F (37.3 C)] 98.5 F (36.9 C) (04/12 0742) Pulse Rate:  [79-96] 84 (04/12 0800) Resp:  [12-22] 12 (04/12 0800) BP: (115-159)/(65-101) 130/83 (04/12 0800) SpO2:  [97 %-100 %] 100 % (04/12 1158) FiO2 (%):  [30 %-40 %] 40 % (04/12 1158)  PHYSICAL EXAMINATION: Physical Examination:   VS: BP 130/83 (BP Location: Left Arm)   Pulse 84   Temp 98.5 F (36.9 C) (Oral)   Resp 12   Ht 5' 2.5" (1.588 m)   Wt 116 lb 2.9 oz (52.7 kg)   SpO2 100%   BMI 20.91 kg/m   Gen: Awake, alert and appropriate HEENT: [place], sclerae white Neck: Tracheostomy site clean Lungs: Improved breath sounds on the right, few right basilar crackles, no wheezes Cardiovascular: Regular, no M Abdomen: Soft, NT, NABS, peg site clean Ext: 1+ oedema Neuro: No focal deficits, global weakness. L>R     LABORATORY PANEL:   CBC Recent Labs  Lab 08/11/17 0559  WBC 9.5  HGB 9.8*  HCT 29.8*  PLT 369    Chemistries  Recent Labs  Lab 08/07/17 0622 08/10/17 0647  NA 139 139  K 4.0 3.9  CL 100* 101  CO2 33* 32  GLUCOSE 115* 83  BUN 21* 16  CREATININE <0.30* <0.30*  CALCIUM 9.2 9.3  MG  1.8  --   PHOS 4.0  --     Recent Labs  Lab 08/10/17 0531 08/10/17 1151 08/10/17 2345 08/11/17 0131 08/11/17 0555 08/11/17 1133  GLUCAP 84 81 59* 80 72 96   CXR; much improved aeration of right lower lobe with minimal residual atelectasis and effusion    ASSESSMENT/PLAN  Prolonged VDRF - little progress weaning  Chronic hypercarbic respiratory failure RLL collapse, resolved Tracheostomy status PEG in situ Recent GIB - resolved Deconditioning  Cont vent support - aggressive vent trials Continue to wean in PSV as tolerated Continue airway hygiene with chest percussion, nebulized N-acetylcysteine, frequent suctioning Follow CXR intermittently Continue PPI therapy Continue nutritional support- restart TF Continue PT/OT Working on placement.  Will need vent SNF versus LTAC   Cammie Sickle, MD PCCM service Mobile [phone] Pager [phone] 08/11/2017 1:23 PM

## 2017-08-12 ENCOUNTER — Inpatient Hospital Stay: Payer: Medicaid Other

## 2017-08-12 LAB — CBC WITH DIFFERENTIAL/PLATELET
Basophils Absolute: 0 10*3/uL (ref 0–0.1)
Basophils Relative: 0 %
EOS PCT: 2 %
Eosinophils Absolute: 0.1 10*3/uL (ref 0–0.7)
HEMATOCRIT: 28.6 % — AB (ref 35.0–47.0)
HEMOGLOBIN: 9.1 g/dL — AB (ref 12.0–16.0)
LYMPHS ABS: 0.8 10*3/uL — AB (ref 1.0–3.6)
LYMPHS PCT: 9 %
MCH: 29.4 pg (ref 26.0–34.0)
MCHC: 32 g/dL (ref 32.0–36.0)
MCV: 92 fL (ref 80.0–100.0)
Monocytes Absolute: 0.5 10*3/uL (ref 0.2–0.9)
Monocytes Relative: 6 %
Neutro Abs: 7 10*3/uL — ABNORMAL HIGH (ref 1.4–6.5)
Neutrophils Relative %: 83 %
Platelets: 346 10*3/uL (ref 150–440)
RBC: 3.11 MIL/uL — ABNORMAL LOW (ref 3.80–5.20)
RDW: 15.7 % — ABNORMAL HIGH (ref 11.5–14.5)
WBC: 8.3 10*3/uL (ref 3.6–11.0)

## 2017-08-12 LAB — CULTURE, RESPIRATORY: SPECIAL REQUESTS: NORMAL

## 2017-08-12 LAB — GLUCOSE, CAPILLARY
Glucose-Capillary: 101 mg/dL — ABNORMAL HIGH (ref 65–99)
Glucose-Capillary: 109 mg/dL — ABNORMAL HIGH (ref 65–99)
Glucose-Capillary: 120 mg/dL — ABNORMAL HIGH (ref 65–99)
Glucose-Capillary: 144 mg/dL — ABNORMAL HIGH (ref 65–99)
Glucose-Capillary: 81 mg/dL (ref 65–99)

## 2017-08-12 LAB — CULTURE, RESPIRATORY W GRAM STAIN

## 2017-08-12 LAB — LACTIC ACID, PLASMA
Lactic Acid, Venous: 1.4 mmol/L (ref 0.5–1.9)
Lactic Acid, Venous: 1.2 mmol/L (ref 0.5–1.9)

## 2017-08-12 MED ORDER — IOPAMIDOL (ISOVUE-300) INJECTION 61%
15.0000 mL | INTRAVENOUS | Status: AC
  Administered 2017-08-12 (×2): 15 mL via ORAL

## 2017-08-12 MED ORDER — MORPHINE SULFATE (PF) 2 MG/ML IV SOLN: 2.0000 mg | INTRAVENOUS | Status: DC | PRN

## 2017-08-12 MED ORDER — DIPHENHYDRAMINE HCL 50 MG/ML IJ SOLN
50.0000 mg | Freq: Every evening | INTRAMUSCULAR | Status: DC | PRN
  Administered 2017-08-12: 50 mg via INTRAVENOUS
  Filled 2017-08-12: qty 1

## 2017-08-12 NOTE — Care Management (Signed)
Patient does not have payer for LTAC. CSW following for Vent SNF placement. Medicaid pending. Medicaid does not pay for LTAC but sometimes covers long-term placement however it may not be in the county. CSW following.

## 2017-08-12 NOTE — Progress Notes (Deleted)
  [facility] Progess Note  CC prolonged ventilator dependence, tracheostomy status  HPI Remains cognitively intact.  In good spirits.  Follows commands briskly.  Good strength in her extremities.  Tolerates pressure support of 15 cm H2O yesterday.  Patient desaturated when placed on SBT this AM; CXR done which showed large amount of air below diaphragm out of proportion from expected from G-Tube placement.  Hence CT abdomen and pelvis done which showed G-Tube in good position without perforation.   VENTILATOR SETTINGS: Vent Mode: PRVC FiO2 (%):  [40 %] 40 % Set Rate:  [15 bmp] 15 bmp Vt Set:  [450 mL] 450 mL PEEP:  [5 cmH20] 5 cmH20 Pressure Support:  [15 cmH20] 15 cmH20 Plateau Pressure:  [14 cmH20-17 cmH20] 14 cmH20  INTAKE / OUTPUT:  Intake/Output Summary (Last 24 hours) at 08/12/2017 1508 Last data filed at 08/12/2017 1115 Gross per 24 hour  Intake 2362.92 ml  Output 330 ml  Net 2032.92 ml    Name: Kayla Boyer MRN: [id number] DOB: [birth year]    ADMISSION DATE:  07/14/2017   VITAL SIGNS: Temp:  [98.2 F (36.8 C)-98.7 F (37.1 C)] 98.2 F (36.8 C) (04/13 1142) Pulse Rate:  [72-93] 85 (04/13 1142) Resp:  [14-29] 19 (04/13 1142) BP: (94-148)/(56-93) 110/68 (04/13 1100) SpO2:  [93 %-100 %] 100 % (04/13 1142) FiO2 (%):  [40 %] 40 % (04/13 1045)  PHYSICAL EXAMINATION: Physical Examination:   VS: BP 110/68   Pulse 85   Temp 98.2 F (36.8 C) (Oral)   Resp 19   Ht 5' 2.5" (1.588 m)   Wt 116 lb 2.9 oz (52.7 kg)   SpO2 100%   BMI 20.91 kg/m   Gen: Awake, alert and appropriate HEENT: [place], sclerae white Neck: Tracheostomy site clean Lungs: Improved breath sounds on the right, few right basilar crackles, no wheezes Cardiovascular: Regular, no M Abdomen: Soft, NT, NABS, peg site clean Ext: 1+ oedema Neuro: No focal deficits, global weakness. L>R     LABORATORY PANEL:   CBC Recent Labs  Lab 08/12/17 1046  WBC 8.3  HGB 9.1*  HCT  28.6*  PLT 346    Chemistries  Recent Labs  Lab 08/07/17 0622 08/10/17 0647  NA 139 139  K 4.0 3.9  CL 100* 101  CO2 33* 32  GLUCOSE 115* 83  BUN 21* 16  CREATININE <0.30* <0.30*  CALCIUM 9.2 9.3  MG 1.8  --   PHOS 4.0  --     Recent Labs  Lab 08/11/17 0555 08/11/17 1133 08/11/17 1803 08/12/17 0038 08/12/17 0632 08/12/17 1156  GLUCAP 72 96 111* 144* 120* 109*   CXR; much improved aeration of right lower lobe with minimal residual atelectasis and effusion    ASSESSMENT/PLAN  Prolonged VDRF - little progress weaning  Chronic hypercarbic respiratory failure RLL collapse, resolved Tracheostomy status PEG in situ Recent GIB - resolved Deconditioning  Cont vent support - aggressive vent trials Continue to wean in PSV as tolerated Continue airway hygiene with chest percussion, nebulized N-acetylcysteine, frequent suctioning Follow CXR intermittently Continue PPI therapy Continue nutritional support- restart TF Continue PT/OT Working on placement.  Will need vent SNF versus LTAC   Cammie Sickle, MD PCCM service Mobile [phone] Pager [phone] 08/12/2017 3:08 PM

## 2017-08-12 NOTE — Progress Notes (Signed)
[facility] Progess Note  CC prolonged ventilator dependence, tracheostomy status  HPI Remains cognitively intact.  In good spirits.  Follows commands briskly.  Good strength in her extremities.  Tolerates pressure support of 15 cm H2O yesterday.  Patient desaturated when placed on SBT this AM; CXR done which showed large amount of air below diaphragm out of proportion from expected from G-Tube placement.  Hence CT abdomen and pelvis done which showed G-Tube in good position without perforation.   VENTILATOR SETTINGS: Vent Mode: PRVC FiO2 (%):  [40 %] 40 % Set Rate:  [15 bmp] 15 bmp Vt Set:  [450 mL] 450 mL PEEP:  [5 cmH20] 5 cmH20 Pressure Support:  [15 cmH20] 15 cmH20 Plateau Pressure:  [14 cmH20-17 cmH20] 14 cmH20  INTAKE / OUTPUT:  Intake/Output Summary (Last 24 hours) at 08/12/2017 1516 Last data filed at 08/12/2017 1115 Gross per 24 hour  Intake 2362.92 ml  Output 330 ml  Net 2032.92 ml    Name: Kayla Boyer MRN: [id number] DOB: 1956-01-13    ADMISSION DATE:  07/14/2017   VITAL SIGNS: Temp:  [98.2 F (36.8 C)-98.7 F (37.1 C)] 98.2 F (36.8 C) (04/13 1142) Pulse Rate:  [72-93] 85 (04/13 1142) Resp:  [14-29] 19 (04/13 1142) BP: (94-148)/(56-93) 110/68 (04/13 1100) SpO2:  [93 %-100 %] 100 % (04/13 1142) FiO2 (%):  [40 %] 40 % (04/13 1045)  PHYSICAL EXAMINATION: Physical Examination:   VS: BP 110/68   Pulse 85   Temp 98.2 F (36.8 C) (Oral)   Resp 19   Ht 5' 2.5" (1.588 m)   Wt 116 lb 2.9 oz (52.7 kg)   SpO2 100%   BMI 20.91 kg/m   Gen: Awake, alert and appropriate HEENT: [place], sclerae white Neck: Tracheostomy site clean Lungs: Improved breath sounds on the right, few right basilar crackles, no wheezes Cardiovascular: Regular, no M Abdomen: Soft, NT, NABS, peg site clean Ext: 1+ oedema Neuro: No focal deficits, global weakness. L>R     LABORATORY PANEL:   CBC Recent Labs  Lab 08/12/17 1046  WBC 8.3  HGB 9.1*  HCT  28.6*  PLT 346    Chemistries  Recent Labs  Lab 08/07/17 0622 08/10/17 0647  NA 139 139  K 4.0 3.9  CL 100* 101  CO2 33* 32  GLUCOSE 115* 83  BUN 21* 16  CREATININE <0.30* <0.30*  CALCIUM 9.2 9.3  MG 1.8  --   PHOS 4.0  --     Recent Labs  Lab 08/11/17 0555 08/11/17 1133 08/11/17 1803 08/12/17 0038 08/12/17 0632 08/12/17 1156  GLUCAP 72 96 111* 144* 120* 109*   CXR; much improved aeration of right lower lobe with minimal residual atelectasis and effusion    ASSESSMENT/PLAN  Prolonged VDRF - tolerated SBT yesterday  Chronic hypercarbic respiratory failure RLL collapse, resolved Tracheostomy Atelectasis PEG in situ  Benign intraperitoneal air post peg tube placement- lactic acid negative, wbc normal, pt non toxic Recent GIB - resolved Deconditioning  Cont vent support - aggressive vent trials Continue to wean in PSV as tolerated Continue airway hygiene with chest percussion, nebulized N-acetylcysteine, frequent suctioning Follow CXR intermittently Continue PPI therapy Continue nutritional support- restart TF Continue PT/OT Will monitor GI air with KUB Working on placement.  Will need vent SNF versus LTAC CT finding discussed again with Dr Anselm Pancoast suggested KUB in am and non urgent surgical consult. Will keep patient NPO. Continue on maintenance IVF.  Updated:  Son  Cammie Sickle, MD PCCM  service Pager [phone] 08/12/2017 3:16 PM

## 2017-08-12 NOTE — Progress Notes (Signed)
Patient transported to CT on transport vent.  Tolerated well.

## 2017-08-12 NOTE — Progress Notes (Signed)
[facility] NAME: Kayla Boyer    MR#:  [id number]  DATE OF BIRTH:  Feb 05, 1956  SUBJECTIVE:  CHIEF COMPLAINT:   Chief Complaint  Patient presents with  . Weakness   -Status post trach and PEG tube was placed yesterday. -Alert and communicates by writing. -Denies any complaints at this time  REVIEW OF SYSTEMS:  Review of Systems  Constitutional: Positive for malaise/fatigue. Negative for chills and fever.  HENT: Negative for congestion, ear discharge, hearing loss and nosebleeds.   Respiratory: Positive for shortness of breath. Negative for cough and wheezing.   Cardiovascular: Negative for chest pain, palpitations and leg swelling.  Gastrointestinal: Negative for abdominal pain, constipation, diarrhea, nausea and vomiting.  Genitourinary: Negative for dysuria.  Neurological: Positive for weakness. Negative for dizziness, seizures and headaches.    DRUG ALLERGIES:  No Known Allergies  VITALS:  Blood pressure (!) 143/68, pulse 93, temperature 98.2 F (36.8 C), temperature source Oral, resp. rate 17, height 5' 2.5" (1.588 m), weight 52.7 kg (116 lb 2.9 oz), SpO2 98 %.  PHYSICAL EXAMINATION:  Physical Exam  GENERAL:  62 y.o.-year-old patient lying in the bed with no acute distress.  EYES: Pupils equal, round, reactive to light and accommodation. No scleral icterus. Extraocular muscles intact.  HEENT: Head atraumatic, normocephalic. Oropharynx and nasopharynx clear.  NECK:  Supple, no jugular venous distention. No thyroid enlargement, no tenderness.  Tracheostomy in place and connected to vent LUNGS: Normal breath sounds bilaterally, no wheezing, rales,rhonchi or crepitation. No use of accessory muscles of respiration.  Decreased bibasilar breath sounds. CARDIOVASCULAR: S1, S2 normal. No murmurs, rubs, or gallops.  ABDOMEN: Soft, nontender, nondistended.  PEG tube in place.  Bowel sounds present. No organomegaly or mass.    EXTREMITIES: No pedal edema, cyanosis, or clubbing.  NEUROLOGIC: Cranial nerves II through XII are intact. Muscle strength 5/5 in all extremities. Sensation intact. Gait not checked.  Global weakness noted. PSYCHIATRIC: The patient is alert and oriented x 3.  SKIN: No obvious rash, lesion, or ulcer.    LABORATORY PANEL:   CBC Recent Labs  Lab 08/11/17 0559  WBC 9.5  HGB 9.8*  HCT 29.8*  PLT 369   ------------------------------------------------------------------------------------------------------------------  Chemistries  Recent Labs  Lab 08/07/17 0622 08/10/17 0647  NA 139 139  K 4.0 3.9  CL 100* 101  CO2 33* 32  GLUCOSE 115* 83  BUN 21* 16  CREATININE <0.30* <0.30*  CALCIUM 9.2 9.3  MG 1.8  --    ------------------------------------------------------------------------------------------------------------------  Cardiac Enzymes No results for input(s): TROPONINI in the last 168 hours. ------------------------------------------------------------------------------------------------------------------  RADIOLOGY:  Dg Chest Port 1 View  Result Date: 08/12/2017 CLINICAL DATA:  Respiratory distress.  Tracheostomy. EXAM: PORTABLE CHEST 1 VIEW COMPARISON:  Chest x-rays dated 08/10/2017 and 08/09/2017. FINDINGS: New free intraperitoneal air underlying both hemidiaphragms. Heart size and mediastinal contours are stable. Tracheostomy appears appropriately positioned in the midline. RIGHT-sided PICC line appears adequately positioned with tip in the mid/lower SVC. Vague opacity at the LEFT lung base, likely atelectasis and/or small pleural effusion. Probable additional mild atelectasis at the medial aspects of the RIGHT lung base. No pneumothorax seen. IMPRESSION: 1. New free intraperitoneal air. Ordering physician tells me that the patient had a gastrostomy tube placed 2 days ago, however, this is a large amount of air and I suspect bowel perforation and or gastrostomy tube  complication. CT may help to determine the source. 2. Probable atelectasis and/or small pleural effusion at  the LEFT lung base. 3. Probable mild atelectasis at the RIGHT lung base. These results were called by telephone at the time of interpretation on 08/12/2017 at 10:29 am to Dr. Cammie Sickle , who verbally acknowledged these results. Electronically Signed   By: Franki Cabot M.D.   On: 08/12/2017 10:37    EKG:   Orders placed or performed during the hospital encounter of 07/14/17  . ED EKG  . ED EKG  . EKG 12-Lead  . EKG 12-Lead    ASSESSMENT AND PLAN:   62 year old female with past medical history significant for chronic respiratory failure secondary to COPD and emphysema, known history of gastric ulcer presents to hospital secondary to weakness and melena.  1.  GI bleed-secondary to chronic gastric ulcer without hemorrhage or perforation. -Appreciate GI consult. -No active bleeding noted.  Hemoglobin is stable.   -Status post EGD on 07/17/2017 showing normal duodenum, nonbleeding gastric ulcer noted.  Continue Protonix.  2.  Acute on chronic respiratory failure-secondary to hypoventilation from muscle weakness and COPD -Initially was on BiPAP, currently status post tracheostomy and 40% FiO2. -Continue trach collar trials every day.  3.  Moderate to severe malnutrition-appreciate dietitian consult. -Status post tracheostomy.  Failed swallow study. -PEG tube placed and currently on PEG tube feeds.  4.  Electrolyte imbalances-being managed by pharmacy  5.  Air under diaphragm post PEG tube placement-CT ordered stat to rule out any esophageal perforation.  6.  Depression-on Remeron  7.  Hypothyroidism-continue thyroid supplements  8.  DVT prophylaxis-currently on teds and SCDs.   encourage to add heparin if hemoglobin is stable   Physical Therapy consult Might need SNF or LTAC  All the records are reviewed and case discussed with Care Management/Social Workerr. Management  plans discussed with the patient, family and they are in agreement.  CODE STATUS: Full code  TOTAL TIME TAKING CARE OF THIS PATIENT: 37 minutes.   POSSIBLE D/C IN ? DAYS, DEPENDING ON CLINICAL CONDITION.   Gladstone Lighter M.D on 08/12/2017 at 11:16 AM  Between 7am to 6pm - Pager - [phone]  After 6pm go to [email or web address] - password [email or web address] [facility]  Office  [phone]  CC: Primary care physician; Patient, [name]

## 2017-08-12 NOTE — Plan of Care (Signed)
Patient tolerating trach well.  PEG tube clean, dry and intact, infusing tube feed at this time without difficulty.  All medications tolerated.  Able to make needs known.  No acute distress noted  Will continue to monitor.

## 2017-08-12 NOTE — Consult Note (Signed)
Patient ID: Keyri Salberg, female   DOB: 1955-05-26, 62 y.o.   MRN: [id number]  HPI Kersten Salmons is a 62 y.o. female in the intensive care unit following a complicated medical admission.  She long-standing history of chronic obstructive lung disease and was admitted with a GI bleeding problem.  She had an exacerbation of her underlying lung problems and has been on a ventilator with tracheostomy since early in her admission.  She had a percutaneous gastrostomy tube placed on 4/11 using a pull-through technique.  No significant problems were encountered in placement of the tube.  This morning she had an episode of desaturation on her breathing trial and follow-up chest x-ray revealed a significant amount of intra-abdominal free air.  CT scan demonstrated a large amount of free air, more than would be expected following a percutaneous procedure.  A question of possible bowel perforation was raised by the radiologist and surgical consultation recommended.  The patient denies any significant abdominal discomfort other than incisional pain at the site of her gastrostomy tube.  Endoscopy did not reveal any significant gastric ulcerations or perforations at the time of the tube placement.  CT scan with contrast did not demonstrate any evidence of contrast extravasation.  HPI  Past Medical History:  Diagnosis Date  . Emphysema of lung ([place])   . GI bleed   . HLD (hyperlipidemia)     Past Surgical History:  Procedure Laterality Date  . ESOPHAGOGASTRODUODENOSCOPY (EGD) WITH PROPOFOL N/A 07/17/2017   Procedure: ESOPHAGOGASTRODUODENOSCOPY (EGD) WITH PROPOFOL;  Surgeon: Lin Landsman, MD;  Location: [facility];  Service: Gastroenterology;  Laterality: N/A;  . NO PAST SURGERIES    . OOPHORECTOMY Left   . TRACHEOSTOMY TUBE PLACEMENT N/A 07/27/2017   Procedure: TRACHEOSTOMY;  Surgeon: Carloyn Manner, MD;  Location: [facility];  Service: ENT;  Laterality: N/A;    Family History  Family history  unknown: Yes    Social History Social History   Tobacco Use  . Smoking status: Never Smoker  . Smokeless tobacco: Never Used  Substance Use Topics  . Alcohol use: No    Frequency: Never  . Drug use: No    No Known Allergies  Current Facility-Administered Medications  Medication Dose Route Frequency Provider Last Rate Last Dose  . 0.9 %  sodium chloride infusion   Intravenous Continuous Vanga, Tally Due, MD      . acetaminophen (TYLENOL) tablet 650 mg  650 mg Per Tube Q6H PRN Saundra Shelling, MD   650 mg at 08/11/17 2251  . albuterol (PROVENTIL) (2.5 MG/3ML) 0.083% nebulizer solution 2.5 mg  2.5 mg Nebulization Q8H Wilhelmina Mcardle, MD   2.5 mg at 08/12/17 1530  . ALPRAZolam Duanne Moron) tablet 0.25 mg  0.25 mg Per Tube TID PRN Nicholes Mango, MD   0.25 mg at 08/12/17 0729  . bisacodyl (DULCOLAX) suppository 10 mg  10 mg Rectal Daily PRN Lafayette Dragon, MD   10 mg at 08/03/17 0939  . chlorhexidine (PERIDEX) 0.12 % solution 15 mL  15 mL Mouth Rinse BID Dorene Sorrow S, NP   15 mL at 08/11/17 2239  . dextrose 5 %-0.9 % sodium chloride infusion   Intravenous Continuous Lafayette Dragon, MD 50 mL/hr at 08/12/17 1755    . feeding supplement (JEVITY 1.2 CAL) liquid 1,000 mL  1,000 mL Per Tube Continuous Saundra Shelling, MD   Stopped at 08/12/17 1115  . feeding supplement (PRO-STAT SUGAR FREE 64) liquid 30 mL  30 mL Per Tube BID Pyreddy, Pavan,  MD   30 mL at 08/12/17 1643  . free water 100 mL  100 mL Per Tube Q6H Wilhelmina Mcardle, MD   100 mL at 08/12/17 0045  . liver oil-zinc oxide (DESITIN) 40 % ointment   Topical Daily Samaan, Maged, MD      . MEDLINE mouth rinse  15 mL Mouth Rinse QID Awilda Bill, NP   15 mL at 08/12/17 0440  . mirtazapine (REMERON SOL-TAB) disintegrating tablet 15 mg  15 mg Per Tube QHS Wilhelmina Mcardle, MD   15 mg at 08/11/17 2245  . ondansetron (ZOFRAN) injection 4 mg  4 mg Intravenous Q6H PRN Saundra Shelling, MD   4 mg at 08/10/17 1408  . pantoprazole sodium  (PROTONIX) 40 mg/20 mL oral suspension 40 mg  40 mg Per Tube Daily Wilhelmina Mcardle, MD   40 mg at 08/12/17 1642  . senna-docusate (Senokot-S) tablet 2 tablet  2 tablet Oral BID Flora Lipps, MD   2 tablet at 08/11/17 2252  . simethicone (MYLICON) chewable tablet 80 mg  80 mg Oral Q6H PRN Wilhelmina Mcardle, MD   80 mg at 08/07/17 1420  . sodium chloride flush (NS) 0.9 % injection 10-40 mL  10-40 mL Intracatheter Q12H Gouru, Aruna, MD   20 mL at 08/12/17 1050  . sodium chloride flush (NS) 0.9 % injection 10-40 mL  10-40 mL Intracatheter PRN Gouru, Aruna, MD      . thyroid (ARMOUR) tablet 30 mg  30 mg Per Tube QAC breakfast Gouru, Aruna, MD   30 mg at 08/12/17 0740     Review of Systems The patient on the ventilator a review of systems was unable to be performed.  Physical Exam Blood pressure 115/80, pulse 82, temperature 98.5 F (36.9 C), temperature source Oral, resp. rate 18, height 5' 2.5" (1.588 m), weight 52.7 kg (116 lb 2.9 oz), SpO2 100 %. CONSTITUTIONAL: She appears comfortable at rest.. RESPIRATORY:  Lungs are clear. There is normal respiratory effort, with equal breath sounds bilaterally, and without pathologic use of accessory muscles.  She does have a tracheostomy in place. CARDIOVASCULAR: Heart is regular without murmurs, gallops, or rubs. GI: Abdomen is soft with no significant abdominal tenderness but some mild abdominal distention.  She does have active bowel sounds.  Gastrostomy tube is currently not in use.    Data Reviewed Laboratory values and CT scan I have personally reviewed the patient's imaging, laboratory findings and medical records.    Assessment    She does not appear to have any significant intra-abdominal perforation.  Clinical examination with her vital signs and physical examination do not support an intra-abdominal perforation.  I have independently reviewed her CT scan.  There is no clear-cut perforation and no evidence of a transcolonic tube placement.     Plan    We will continue to follow her at this point would not recommend any further intervention.  Plan will be to operate if the patient did develop clinical signs of peritonitis or intraperitoneal perforation.  Plan was discussed with the nurse practitioner on call this evening.      Dia Crawford III 08/12/2017, 9:09 PM

## 2017-08-13 ENCOUNTER — Inpatient Hospital Stay: Payer: Medicaid Other

## 2017-08-13 LAB — GLUCOSE, CAPILLARY
GLUCOSE-CAPILLARY: 75 mg/dL (ref 65–99)
GLUCOSE-CAPILLARY: 96 mg/dL (ref 65–99)
GLUCOSE-CAPILLARY: 79 mg/dL (ref 65–99)

## 2017-08-13 MED ORDER — MORPHINE SULFATE (PF) 2 MG/ML IV SOLN
2.0000 mg | INTRAVENOUS | Status: DC | PRN
  Administered 2017-08-13 – 2017-08-14 (×3): 2 mg via INTRAVENOUS
  Filled 2017-08-13 (×3): qty 1

## 2017-08-13 NOTE — Progress Notes (Signed)
Spoke with Burman Nieves, RN, patient unable to void this shift. Verbal orders from [facility], NP to wait until patient wakes up and to preform bladder scan, if large amount of urine present, preform in and out cath. Will continue to assess.

## 2017-08-13 NOTE — Progress Notes (Signed)
Maggie NP, notified of patients request for sub lilngual remeron, for insomnia. Verbal orders from [facility] NP to give Remeron Sublingual. Orders verified. Will continue to assess.

## 2017-08-13 NOTE — Progress Notes (Signed)
CC: Free air Subjective: NO abd pain, tolerates feeds. Tolerating TF KUB some free air, no obstruction   Objective: Vital signs in last 24 hours: Temp:  [98 F (36.7 C)-98.7 F (37.1 C)] 98.1 F (36.7 C) (04/14 0829) Pulse Rate:  [71-101] 73 (04/14 0800) Resp:  [15-20] 20 (04/14 0800) BP: (84-163)/(63-113) 143/110 (04/14 0800) SpO2:  [100 %] 100 % (04/14 0800) FiO2 (%):  [40 %] 40 % (04/14 0809) Last BM Date: 08/09/17(per pt)  Intake/Output from previous day: 04/13 0701 - 04/14 0700 In: 1810.4 [I.V.:1547.9; NG/GT:262.5] Out: 666 [Urine:666] Intake/Output this shift: Total I/O In: 50 [I.V.:50] Out: -   Physical exam: NAD, trach in place Abd: soft, NT, PEG in place Ext: well perfused, warm  Lab Results: CBC  Recent Labs    08/11/17 0559 08/12/17 1046  WBC 9.5 8.3  HGB 9.8* 9.1*  HCT 29.8* 28.6*  PLT 369 346   BMET No results for input(s): NA, K, CL, CO2, GLUCOSE, BUN, CREATININE, CALCIUM in the last 72 hours. PT/INR No results for input(s): LABPROT, INR in the last 72 hours. ABG No results for input(s): PHART, HCO3 in the last 72 hours.  Invalid input(s): PCO2, PO2  Studies/Results: Ct Abdomen Pelvis Wo Contrast  Addendum Date: 08/12/2017   ADDENDUM REPORT: 08/12/2017 17:22 ADDENDUM: Gastrostomy tube is positioned in the stomach but there is concern for a small amount of tissue between the anterior stomach wall and the abdominal wall. There is a small amount of gas within this soft tissue and findings raise concern that a small segment of colon is tethered between the stomach and the anterior abdominal wall. Soft tissue is seen on sequence 2, image 40. The concern for colonic involvement is also seen on sequence 2, image 41. Recommend follow-up gastric tube injection under fluoroscopy. These results were called by telephone at the time of interpretation on 08/12/2017 at 5:16 pm to Dr. Cammie Sickle , who verbally acknowledged these results. Electronically Signed    By: Markus Daft M.D.   On: 08/12/2017 17:22   Result Date: 08/12/2017 CLINICAL DATA:  Abnormal x-ray showing possible free air. Peg tube placement 2 days ago. Recent GI bleed. EXAM: CT ABDOMEN AND PELVIS WITHOUT CONTRAST TECHNIQUE: Multidetector CT imaging of the abdomen and pelvis was performed following the standard protocol without IV contrast. COMPARISON:  CT abdomen dated 07/04/2017. FINDINGS: Lower chest: New dense consolidations at both lung bases, pneumonia versus atelectasis, and small bilateral pleural effusions. Hepatobiliary: Gallbladder appears to be filled with stones but there are no inflammatory changes about the gallbladder to suggest acute cholecystitis. No focal liver abnormality is seen. No bile duct dilatation seen. Pancreas: Unremarkable. No pancreatic ductal dilatation or surrounding inflammatory changes. Spleen: Normal in size without focal abnormality. Adrenals/Urinary Tract: Adrenal glands appear normal. Small nonobstructing renal stones bilaterally. No ureteral or bladder calculi identified. Bladder appears normal. Stomach/Bowel: Peg tube appears grossly well-positioned. Stomach otherwise unremarkable. No dilated large or small bowel loops. Appendix is normal. Vascular/Lymphatic: Mild aortic atherosclerosis. No enlarged lymph nodes seen in the abdomen or pelvis. Reproductive: Uterus and bilateral adnexa are unremarkable. Other: Large amount of free intraperitoneal air within the anterior abdomen, centered within the upper abdomen adjacent to the stomach. No extraluminal oral contrast material to confirm bowel perforation. No free fluid or abscess collection. Musculoskeletal: No acute or suspicious osseous finding. Air is seen extending from the anterior abdominal wall to the umbilicus near the skin surface, presumably within a small periumbilical hernia. IMPRESSION: 1. Large amount of  free intraperitoneal air within the anterior abdomen, centered about the stomach where there was a  recent PEG tube placement. Gastrostomy tube appears appropriately positioned in the stomach. No evidence of gastrostomy tube related complication. No source for bowel perforation identified and no extraluminal oral contrast material to confirm an active perforation. The free intraperitoneal air may be benign and related to the recent PEG tube placement, although more prominent than is typically seen. If clinically stable, recommend follow-up plain film examinations to ensure resolution. 2. Small amount of air is extending from the intraperitoneal cavity to the periumbilical abdominal wall, extending near the skin surface, presumably extension of the free intraperitoneal air into a periumbilical abdominal wall hernia. Recommend physical examination of the umbilicus to exclude the less likely possibility of patent fistulous tract. 3. New dense consolidation within the LEFT lower lung, at least moderate in size and incompletely imaged, pneumonia versus atelectasis/airspace collapse with small adjacent pleural effusion. Additional consolidation at the RIGHT lung base, also incompletely imaged, likely a combination of atelectasis and pleural effusion. 4. Cholelithiasis without evidence of acute cholecystitis. 5. Bilateral nephrolithiasis (tiny stones) without hydronephrosis. 6. Aortic atherosclerosis. Electronically Signed: By: Franki Cabot M.D. On: 08/12/2017 14:13   Dg Abd 1 View  Result Date: 08/13/2017 CLINICAL DATA:  Status post insertion of percutaneous endoscopic gastrostomy. EXAM: ABDOMEN - 1 VIEW COMPARISON:  None. FINDINGS: Again noted is a large amount of free intraperitoneal air within the upper abdomen, most prominent about the stomach and extending into the RIGHT upper quadrant. Gastrostomy tube in place. Overall bowel gas pattern is nonobstructive. IMPRESSION: 1. Persistent free intraperitoneal air. Yesterday's CT showed extensive free intraperitoneal air with concern for tethering of the colon  between the stomach and anterior abdominal wall by the percutaneous gastrostomy tube. 2. Nonobstructive bowel gas pattern. Electronically Signed   By: Franki Cabot M.D.   On: 08/13/2017 08:00   Dg Chest Port 1 View  Result Date: 08/12/2017 CLINICAL DATA:  Respiratory distress.  Tracheostomy. EXAM: PORTABLE CHEST 1 VIEW COMPARISON:  Chest x-rays dated 08/10/2017 and 08/09/2017. FINDINGS: New free intraperitoneal air underlying both hemidiaphragms. Heart size and mediastinal contours are stable. Tracheostomy appears appropriately positioned in the midline. RIGHT-sided PICC line appears adequately positioned with tip in the mid/lower SVC. Vague opacity at the LEFT lung base, likely atelectasis and/or small pleural effusion. Probable additional mild atelectasis at the medial aspects of the RIGHT lung base. No pneumothorax seen. IMPRESSION: 1. New free intraperitoneal air. Ordering physician tells me that the patient had a gastrostomy tube placed 2 days ago, however, this is a large amount of air and I suspect bowel perforation and or gastrostomy tube complication. CT may help to determine the source. 2. Probable atelectasis and/or small pleural effusion at the LEFT lung base. 3. Probable mild atelectasis at the RIGHT lung base. These results were called by telephone at the time of interpretation on 08/12/2017 at 10:29 am to Dr. Cammie Sickle , who verbally acknowledged these results. Electronically Signed   By: Franki Cabot M.D.   On: 08/12/2017 10:37    Anti-infectives: Anti-infectives (From admission, onward)   Start     Dose/Rate Route Frequency Ordered Stop   08/10/17 1300  ceFAZolin (ANCEF) IVPB 2g/100 mL premix     2 g 200 mL/hr over 30 Minutes Intravenous Once 08/10/17 1257 08/10/17 1344   08/04/17 2200  ceFEPIme (MAXIPIME) 1 g in sodium chloride 0.9 % 100 mL IVPB  Status:  Discontinued     1 g 200  mL/hr over 30 Minutes Intravenous Every 8 hours 08/04/17 1218 08/04/17 1448   08/04/17 2200   ceFEPIme (MAXIPIME) 1 g in sodium chloride 0.9 % 100 mL IVPB  Status:  Discontinued     1 g 200 mL/hr over 30 Minutes Intravenous Every 8 hours 08/04/17 1448 08/07/17 1119   08/03/17 0100  piperacillin-tazobactam (ZOSYN) IVPB 3.375 g  Status:  Discontinued     3.375 g 12.5 mL/hr over 240 Minutes Intravenous Every 8 hours 08/02/17 1701 08/02/17 1704   08/02/17 1730  piperacillin-tazobactam (ZOSYN) IVPB 3.375 g  Status:  Discontinued     3.375 g 12.5 mL/hr over 240 Minutes Intravenous Every 8 hours 08/02/17 1704 08/04/17 1217   08/02/17 1645  piperacillin-tazobactam (ZOSYN) IVPB 3.375 g  Status:  Discontinued     3.375 g 12.5 mL/hr over 240 Minutes Intravenous Every 8 hours 08/02/17 1639 08/02/17 1701   07/14/17 2200  azithromycin (ZITHROMAX) 500 mg in sodium chloride 0.9 % 250 mL IVPB  Status:  Discontinued     500 mg 250 mL/hr over 60 Minutes Intravenous Every 24 hours 07/14/17 2122 07/18/17 0915      Assessment/Plan: Free air from recent PEG, no clinical signs of perforation or peritonitis We will continue to follow No surgical intervention at this time  Caroleen Hamman, MD, FACS  08/13/2017

## 2017-08-13 NOTE — Progress Notes (Signed)
Per Dr. Celesta Aver, we are able to use pt's G-Tube.She stated to administer meds through G tube as well as reinitiate feeding at 1ml/hr initially.

## 2017-08-13 NOTE — Progress Notes (Signed)
  [facility] Progess Note  CC prolonged ventilator dependence, tracheostomy status  HPI Remains cognitively intact.  In good spirits.  Follows commands briskly.  Good strength in her extremities.  Tolerates pressure support of 15 cm H2O yesterday.  04/13 Patient desaturated when placed on SBT this AM; CXR done which showed large amount of air below diaphragm out of proportion from expected from G-Tube placement.  Hence CT abdomen and pelvis done which showed G-Tube in good position without perforation.  04/14 Patient has been seen by the surgical team who also reviewed her films and saw no perforation.  She tolerated SBT today.  VENTILATOR SETTINGS: Vent Mode: PRVC FiO2 (%):  [40 %] 40 % Set Rate:  [15 bmp] 15 bmp Vt Set:  [450 mL] 450 mL PEEP:  [5 cmH20] 5 cmH20 Pressure Support:  [10 cmH20] 10 cmH20  INTAKE / OUTPUT:  Intake/Output Summary (Last 24 hours) at 08/13/2017 1840 Last data filed at 08/13/2017 1838 Gross per 24 hour  Intake 1580.5 ml  Output 566 ml  Net 1014.5 ml    Name: Ayjah Show MRN: [id number] DOB: Aug 01, 1955    ADMISSION DATE:  07/14/2017   VITAL SIGNS: Temp:  [98 F (36.7 C)-98.5 F (36.9 C)] 98.5 F (36.9 C) (04/14 1704) Pulse Rate:  [71-89] 71 (04/14 1800) Resp:  [15-22] 15 (04/14 1800) BP: (84-163)/(58-110) 109/82 (04/14 1800) SpO2:  [100 %] 100 % (04/14 1800) FiO2 (%):  [40 %] 40 % (04/14 1614)  PHYSICAL EXAMINATION: Physical Examination:   VS: BP 109/82   Pulse 71   Temp 98.5 F (36.9 C) (Oral)   Resp 15   Ht 5' 2.5" (1.588 m)   Wt 116 lb 2.9 oz (52.7 kg)   SpO2 100%   BMI 20.91 kg/m   Gen: Awake, alert and appropriate HEENT: [place], sclerae white Neck: Tracheostomy site clean Lungs: Improved breath sounds on the right, few right basilar crackles, no wheezes Cardiovascular: Regular, no M Abdomen: Soft, NT, NABS, peg site clean Ext: 1+ oedema Neuro: No focal deficits, global weakness. L>R     LABORATORY  PANEL:   CBC Recent Labs  Lab 08/12/17 1046  WBC 8.3  HGB 9.1*  HCT 28.6*  PLT 346    Chemistries  Recent Labs  Lab 08/07/17 0622 08/10/17 0647  NA 139 139  K 4.0 3.9  CL 100* 101  CO2 33* 32  GLUCOSE 115* 83  BUN 21* 16  CREATININE <0.30* <0.30*  CALCIUM 9.2 9.3  MG 1.8  --   PHOS 4.0  --     Recent Labs  Lab 08/12/17 1156 08/12/17 1754 08/12/17 2357 08/13/17 0557 08/13/17 1140 08/13/17 1720  GLUCAP 109* 101* 81 75 96 79      ASSESSMENT/PLAN  Prolonged VDRF - tolerated SBT for 7 hours today  Chronic hypercarbic respiratory failure RLL collapse, resolved Tracheostomy Atelectasis PEG in situ  Benign intraperitoneal air post peg tube placement- lactic acid negative, wbc normal, pt non toxic, seen by surgeon- no perforation seen so TF restarted Recent GIB - resolved Deconditioning  Cont vent support - aggressive vent trials Continue to wean in PSV as tolerated Continue airway hygiene with chest percussion, nebulized  Continue PPI therapy Continue nutritional support-  TF restarted Continue PT/OT Will monitor intraperitoneal air with KUB /CXR intermittently Working on placement.  Will need vent SNF versus LTAC  Updated:  Son  Cammie Sickle, MD PCCM service Pager [phone] 08/13/2017 6:40 PM

## 2017-08-13 NOTE — Progress Notes (Signed)
Pt states that her abdomen is tender, but has not gotten worse since tube feeding has been initiated today. BS remain hypoactive and faint. I spoke to Dr. Bonna Gains and she stated to continue tube feeds at 87mls/hr at this time.

## 2017-08-13 NOTE — Progress Notes (Signed)
Vonda Antigua, MD [address] [place], [place], [place], [address] [address], [place], [place], [place], [address] Phone: [phone]  Fax: [phone]   Subjective:  Patient sitting in bed comfortably.  No abdominal pain.  No nausea or vomiting.  No complaints.  Objective: Exam: Vital signs in last 24 hours: Vitals:   08/13/17 1200 08/13/17 1242 08/13/17 1300 08/13/17 1400  BP: 129/81  119/67 139/66  Pulse: 76  80 87  Resp: 20  (!) 22 20  Temp:  98.2 F (36.8 C)    TempSrc:  Oral    SpO2: 100%  100% 100%  Weight:      Height:       Weight change:   Intake/Output Summary (Last 24 hours) at 08/13/2017 1551 Last data filed at 08/13/2017 1525 Gross per 24 hour  Intake 1770.5 ml  Output 816 ml  Net 954.5 ml    General: No acute distress, AAO x3 Abd: Soft, NT/ND, No HSM PEG tube external bumper at around 5.5 cm mark.  No tenderness at PEG tube site.  No drainage from PEG tube site.  PEG tube moved in and out easily without tenderness or difficulty.  PEG tube external bumper tightened to 4.75 cm mark. Skin: Warm, no rashes Neck: Supple, Trachea midline   Lab Results: Lab Results  Component Value Date   WBC 8.3 08/12/2017   HGB 9.1 (L) 08/12/2017   HCT 28.6 (L) 08/12/2017   MCV 92.0 08/12/2017   PLT 346 08/12/2017   Micro Results: Recent Results (from the past 240 hour(s))  Culture, respiratory (NON-Expectorated)     Status: None   Collection Time: 08/06/17  9:32 PM  Result Value Ref Range Status   Specimen Description   Final    INDUCED SPUTUM Performed at [facility], [address], [place], [place] [address]    Special Requests   Final    NONE Performed at [facility], [address] [place], [place] [address]    Gram Stain   Final    RARE WBC PRESENT, PREDOMINANTLY PMN NO ORGANISMS SEEN    Culture   Final    RARE Consistent with normal respiratory flora. Performed at [facility], [place] [address],  [place], [place] [address]    Report Status 08/09/2017 FINAL  Final  Culture, respiratory (NON-Expectorated)     Status: None   Collection Time: 08/09/17  3:24 PM  Result Value Ref Range Status   Specimen Description   Final    TRACHEAL ASPIRATE Performed at [facility], [address], [place], [place] [address]    Special Requests   Final    Normal Performed at [facility], [address] [place], [place] [address]    Gram Stain   Final    ABUNDANT WBC PRESENT,BOTH PMN AND MONONUCLEAR NO ORGANISMS SEEN Performed at [facility], [place] [address], [place], [place] [address]    Culture RARE STAPHYLOCOCCUS SPECIES (COAGULASE NEGATIVE)  Final   Report Status 08/12/2017 FINAL  Final   Organism ID, Bacteria STAPHYLOCOCCUS SPECIES (COAGULASE NEGATIVE)  Final      Susceptibility   Staphylococcus species (coagulase negative) - MIC*    CIPROFLOXACIN 4 RESISTANT Resistant     ERYTHROMYCIN >=8 RESISTANT Resistant     GENTAMICIN <=0.5 SENSITIVE Sensitive     OXACILLIN >=4 RESISTANT Resistant     TETRACYCLINE <=1 SENSITIVE Sensitive     VANCOMYCIN 2 SENSITIVE Sensitive  TRIMETH/SULFA <=10 SENSITIVE Sensitive     CLINDAMYCIN <=0.25 SENSITIVE Sensitive     RIFAMPIN <=0.5 SENSITIVE Sensitive     Inducible Clindamycin NEGATIVE Sensitive     * RARE STAPHYLOCOCCUS SPECIES (COAGULASE NEGATIVE)   Studies/Results: Ct Abdomen Pelvis Wo Contrast  Addendum Date: 08/12/2017   ADDENDUM REPORT: 08/12/2017 17:22 ADDENDUM: Gastrostomy tube is positioned in the stomach but there is concern for a small amount of tissue between the anterior stomach wall and the abdominal wall. There is a small amount of gas within this soft tissue and findings raise concern that a small segment of colon is tethered between the stomach and the anterior abdominal wall. Soft tissue is seen on sequence 2, image 40. The concern for colonic involvement is also seen on sequence 2, image 41. Recommend  follow-up gastric tube injection under fluoroscopy. These results were called by telephone at the time of interpretation on 08/12/2017 at 5:16 pm to Dr. Cammie Sickle , who verbally acknowledged these results. Electronically Signed   By: Markus Daft M.D.   On: 08/12/2017 17:22   Result Date: 08/12/2017 CLINICAL DATA:  Abnormal x-ray showing possible free air. Peg tube placement 2 days ago. Recent GI bleed. EXAM: CT ABDOMEN AND PELVIS WITHOUT CONTRAST TECHNIQUE: Multidetector CT imaging of the abdomen and pelvis was performed following the standard protocol without IV contrast. COMPARISON:  CT abdomen dated 07/04/2017. FINDINGS: Lower chest: New dense consolidations at both lung bases, pneumonia versus atelectasis, and small bilateral pleural effusions. Hepatobiliary: Gallbladder appears to be filled with stones but there are no inflammatory changes about the gallbladder to suggest acute cholecystitis. No focal liver abnormality is seen. No bile duct dilatation seen. Pancreas: Unremarkable. No pancreatic ductal dilatation or surrounding inflammatory changes. Spleen: Normal in size without focal abnormality. Adrenals/Urinary Tract: Adrenal glands appear normal. Small nonobstructing renal stones bilaterally. No ureteral or bladder calculi identified. Bladder appears normal. Stomach/Bowel: Peg tube appears grossly well-positioned. Stomach otherwise unremarkable. No dilated large or small bowel loops. Appendix is normal. Vascular/Lymphatic: Mild aortic atherosclerosis. No enlarged lymph nodes seen in the abdomen or pelvis. Reproductive: Uterus and bilateral adnexa are unremarkable. Other: Large amount of free intraperitoneal air within the anterior abdomen, centered within the upper abdomen adjacent to the stomach. No extraluminal oral contrast material to confirm bowel perforation. No free fluid or abscess collection. Musculoskeletal: No acute or suspicious osseous finding. Air is seen extending from the anterior  abdominal wall to the umbilicus near the skin surface, presumably within a small periumbilical hernia. IMPRESSION: 1. Large amount of free intraperitoneal air within the anterior abdomen, centered about the stomach where there was a recent PEG tube placement. Gastrostomy tube appears appropriately positioned in the stomach. No evidence of gastrostomy tube related complication. No source for bowel perforation identified and no extraluminal oral contrast material to confirm an active perforation. The free intraperitoneal air may be benign and related to the recent PEG tube placement, although more prominent than is typically seen. If clinically stable, recommend follow-up plain film examinations to ensure resolution. 2. Small amount of air is extending from the intraperitoneal cavity to the periumbilical abdominal wall, extending near the skin surface, presumably extension of the free intraperitoneal air into a periumbilical abdominal wall hernia. Recommend physical examination of the umbilicus to exclude the less likely possibility of patent fistulous tract. 3. New dense consolidation within the LEFT lower lung, at least moderate in size and incompletely imaged, pneumonia versus atelectasis/airspace collapse with small adjacent pleural effusion. Additional consolidation at the RIGHT  lung base, also incompletely imaged, likely a combination of atelectasis and pleural effusion. 4. Cholelithiasis without evidence of acute cholecystitis. 5. Bilateral nephrolithiasis (tiny stones) without hydronephrosis. 6. Aortic atherosclerosis. Electronically Signed: By: Franki Cabot M.D. On: 08/12/2017 14:13   Dg Abd 1 View  Result Date: 08/13/2017 CLINICAL DATA:  Status post insertion of percutaneous endoscopic gastrostomy. EXAM: ABDOMEN - 1 VIEW COMPARISON:  None. FINDINGS: Again noted is a large amount of free intraperitoneal air within the upper abdomen, most prominent about the stomach and extending into the RIGHT upper  quadrant. Gastrostomy tube in place. Overall bowel gas pattern is nonobstructive. IMPRESSION: 1. Persistent free intraperitoneal air. Yesterday's CT showed extensive free intraperitoneal air with concern for tethering of the colon between the stomach and anterior abdominal wall by the percutaneous gastrostomy tube. 2. Nonobstructive bowel gas pattern. Electronically Signed   By: Franki Cabot M.D.   On: 08/13/2017 08:00   Dg Chest Port 1 View  Result Date: 08/12/2017 CLINICAL DATA:  Respiratory distress.  Tracheostomy. EXAM: PORTABLE CHEST 1 VIEW COMPARISON:  Chest x-rays dated 08/10/2017 and 08/09/2017. FINDINGS: New free intraperitoneal air underlying both hemidiaphragms. Heart size and mediastinal contours are stable. Tracheostomy appears appropriately positioned in the midline. RIGHT-sided PICC line appears adequately positioned with tip in the mid/lower SVC. Vague opacity at the LEFT lung base, likely atelectasis and/or small pleural effusion. Probable additional mild atelectasis at the medial aspects of the RIGHT lung base. No pneumothorax seen. IMPRESSION: 1. New free intraperitoneal air. Ordering physician tells me that the patient had a gastrostomy tube placed 2 days ago, however, this is a large amount of air and I suspect bowel perforation and or gastrostomy tube complication. CT may help to determine the source. 2. Probable atelectasis and/or small pleural effusion at the LEFT lung base. 3. Probable mild atelectasis at the RIGHT lung base. These results were called by telephone at the time of interpretation on 08/12/2017 at 10:29 am to Dr. Cammie Sickle , who verbally acknowledged these results. Electronically Signed   By: Franki Cabot M.D.   On: 08/12/2017 10:37   Medications:  Scheduled Meds: . albuterol  2.5 mg Nebulization Q8H  . chlorhexidine  15 mL Mouth Rinse BID  . feeding supplement (PRO-STAT SUGAR FREE 64)  30 mL Per Tube BID  . free water  100 mL Per Tube Q6H  . liver oil-zinc  oxide   Topical Daily  . mouth rinse  15 mL Mouth Rinse QID  . mirtazapine  15 mg Per Tube QHS  . pantoprazole sodium  40 mg Per Tube Daily  . senna-docusate  2 tablet Oral BID  . sodium chloride flush  10-40 mL Intracatheter Q12H  . thyroid  30 mg Per Tube QAC breakfast   Continuous Infusions: . sodium chloride    . dextrose 5 % and 0.9% NaCl 50 mL/hr at 08/13/17 0600  . feeding supplement (JEVITY 1.2 CAL) 1,000 mL (08/13/17 0911)   PRN Meds:.acetaminophen, ALPRAZolam, bisacodyl, diphenhydrAMINE, morphine injection, [DISCONTINUED] ondansetron **OR** ondansetron (ZOFRAN) IV, simethicone, sodium chloride flush   Assessment: PEG tube   Plan: Patient has shortness of breath yesterday which led to a chest x-ray, that showed air under the diaphragm This lead to a CT scan, that showed PEG to be in place appropriately in the stomach However, after this, an addendum was placed to the CT scan, and I was not informed of this addendum at any point until I saw it myself today during rounds.  The addendum notes  that gastrostomy tube is positioned in the stomach but there is concern for small amount of tissue between the anterior stomach wall and the abdominal wall.  There is a small amount of gas within the soft tissue and findings raise concern for a small segment of colon was tethered between the stomach and the anterior abdominal wall.  After this addendum, Dr. Celesta Aver had contacted surgery who has seen the patient.  On review of her imaging, clinical exam, and Dr. Pat Patrick and Dr. Dahlia Byes, does not think there are any signs of perforation or peritonitis, and do not recommend any surgical intervention.  Patient appears very comfortable today, with a normal abdominal exam. I have discussed the patient with my partners who placed the PEG tube as well, and they reviewed the patient's chart and findings as well.  It was decided, and that tightening the external bumper would help at this time, and I have  tightened it as indicated above in physical exam.  Have discussed this with the patient's nurse as well, and asked her not to place any dressing underneath the external bumper to avoid buried bumper syndrome We will reevaluate PEG site again tomorrow  Dr. Celesta Aver, had already started feeds through the PEG tube, yesterday.   LOS: 30 days   Vonda Antigua, MD 08/13/2017, 3:51 PM

## 2017-08-13 NOTE — Progress Notes (Signed)
[facility] NAME: Kayla Boyer    MR#:  [id number]  DATE OF BIRTH:  January 16, 1956  SUBJECTIVE:  CHIEF COMPLAINT:   Chief Complaint  Patient presents with  . Weakness   -Status post trach and PEG tube. -Alert and communicates by writing. -Denies any complaints at this time  REVIEW OF SYSTEMS:  Review of Systems  Constitutional: Positive for malaise/fatigue. Negative for chills and fever.  HENT: Negative for congestion, ear discharge, hearing loss and nosebleeds.   Respiratory: Positive for shortness of breath. Negative for cough and wheezing.   Cardiovascular: Negative for chest pain, palpitations and leg swelling.  Gastrointestinal: Positive for abdominal pain. Negative for constipation, diarrhea, nausea and vomiting.  Genitourinary: Negative for dysuria.  Neurological: Positive for weakness. Negative for dizziness, seizures and headaches.    DRUG ALLERGIES:  No Known Allergies  VITALS:  Blood pressure (!) 143/110, pulse 73, temperature 98.1 F (36.7 C), temperature source Oral, resp. rate 20, height 5' 2.5" (1.588 m), weight 52.7 kg (116 lb 2.9 oz), SpO2 100 %.  PHYSICAL EXAMINATION:  Physical Exam  GENERAL:  62 y.o.-year-old patient lying in the bed with no acute distress.  EYES: Pupils equal, round, reactive to light and accommodation. No scleral icterus. Extraocular muscles intact.  HEENT: Head atraumatic, normocephalic. Oropharynx and nasopharynx clear.  NECK:  Supple, no jugular venous distention. No thyroid enlargement, no tenderness.  Tracheostomy in place and connected to vent LUNGS: Normal breath sounds bilaterally, no wheezing, rales,rhonchi or crepitation. No use of accessory muscles of respiration.  Decreased bibasilar breath sounds. CARDIOVASCULAR: S1, S2 normal. No murmurs, rubs, or gallops.  ABDOMEN: Soft, nontender, nondistended.  PEG tube in place.  Bowel sounds present. No organomegaly or mass.    EXTREMITIES: No pedal edema, cyanosis, or clubbing.  NEUROLOGIC: Cranial nerves II through XII are intact. Muscle strength 5/5 in all extremities. Sensation intact. Gait not checked.  Global weakness noted. PSYCHIATRIC: The patient is alert and oriented x 3.  SKIN: No obvious rash, lesion, or ulcer.    LABORATORY PANEL:   CBC Recent Labs  Lab 08/12/17 1046  WBC 8.3  HGB 9.1*  HCT 28.6*  PLT 346   ------------------------------------------------------------------------------------------------------------------  Chemistries  Recent Labs  Lab 08/07/17 0622 08/10/17 0647  NA 139 139  K 4.0 3.9  CL 100* 101  CO2 33* 32  GLUCOSE 115* 83  BUN 21* 16  CREATININE <0.30* <0.30*  CALCIUM 9.2 9.3  MG 1.8  --    ------------------------------------------------------------------------------------------------------------------  Cardiac Enzymes No results for input(s): TROPONINI in the last 168 hours. ------------------------------------------------------------------------------------------------------------------  RADIOLOGY:  Ct Abdomen Pelvis Wo Contrast  Addendum Date: 08/12/2017   ADDENDUM REPORT: 08/12/2017 17:22 ADDENDUM: Gastrostomy tube is positioned in the stomach but there is concern for a small amount of tissue between the anterior stomach wall and the abdominal wall. There is a small amount of gas within this soft tissue and findings raise concern that a small segment of colon is tethered between the stomach and the anterior abdominal wall. Soft tissue is seen on sequence 2, image 40. The concern for colonic involvement is also seen on sequence 2, image 41. Recommend follow-up gastric tube injection under fluoroscopy. These results were called by telephone at the time of interpretation on 08/12/2017 at 5:16 pm to Dr. Cammie Sickle , who verbally acknowledged these results. Electronically Signed   By: Markus Daft M.D.   On: 08/12/2017 17:22   Result Date: 08/12/2017 CLINICAL  DATA:  Abnormal x-ray showing possible free air. Peg tube placement 2 days ago. Recent GI bleed. EXAM: CT ABDOMEN AND PELVIS WITHOUT CONTRAST TECHNIQUE: Multidetector CT imaging of the abdomen and pelvis was performed following the standard protocol without IV contrast. COMPARISON:  CT abdomen dated 07/04/2017. FINDINGS: Lower chest: New dense consolidations at both lung bases, pneumonia versus atelectasis, and small bilateral pleural effusions. Hepatobiliary: Gallbladder appears to be filled with stones but there are no inflammatory changes about the gallbladder to suggest acute cholecystitis. No focal liver abnormality is seen. No bile duct dilatation seen. Pancreas: Unremarkable. No pancreatic ductal dilatation or surrounding inflammatory changes. Spleen: Normal in size without focal abnormality. Adrenals/Urinary Tract: Adrenal glands appear normal. Small nonobstructing renal stones bilaterally. No ureteral or bladder calculi identified. Bladder appears normal. Stomach/Bowel: Peg tube appears grossly well-positioned. Stomach otherwise unremarkable. No dilated large or small bowel loops. Appendix is normal. Vascular/Lymphatic: Mild aortic atherosclerosis. No enlarged lymph nodes seen in the abdomen or pelvis. Reproductive: Uterus and bilateral adnexa are unremarkable. Other: Large amount of free intraperitoneal air within the anterior abdomen, centered within the upper abdomen adjacent to the stomach. No extraluminal oral contrast material to confirm bowel perforation. No free fluid or abscess collection. Musculoskeletal: No acute or suspicious osseous finding. Air is seen extending from the anterior abdominal wall to the umbilicus near the skin surface, presumably within a small periumbilical hernia. IMPRESSION: 1. Large amount of free intraperitoneal air within the anterior abdomen, centered about the stomach where there was a recent PEG tube placement. Gastrostomy tube appears appropriately positioned in the  stomach. No evidence of gastrostomy tube related complication. No source for bowel perforation identified and no extraluminal oral contrast material to confirm an active perforation. The free intraperitoneal air may be benign and related to the recent PEG tube placement, although more prominent than is typically seen. If clinically stable, recommend follow-up plain film examinations to ensure resolution. 2. Small amount of air is extending from the intraperitoneal cavity to the periumbilical abdominal wall, extending near the skin surface, presumably extension of the free intraperitoneal air into a periumbilical abdominal wall hernia. Recommend physical examination of the umbilicus to exclude the less likely possibility of patent fistulous tract. 3. New dense consolidation within the LEFT lower lung, at least moderate in size and incompletely imaged, pneumonia versus atelectasis/airspace collapse with small adjacent pleural effusion. Additional consolidation at the RIGHT lung base, also incompletely imaged, likely a combination of atelectasis and pleural effusion. 4. Cholelithiasis without evidence of acute cholecystitis. 5. Bilateral nephrolithiasis (tiny stones) without hydronephrosis. 6. Aortic atherosclerosis. Electronically Signed: By: Franki Cabot M.D. On: 08/12/2017 14:13   Dg Abd 1 View  Result Date: 08/13/2017 CLINICAL DATA:  Status post insertion of percutaneous endoscopic gastrostomy. EXAM: ABDOMEN - 1 VIEW COMPARISON:  None. FINDINGS: Again noted is a large amount of free intraperitoneal air within the upper abdomen, most prominent about the stomach and extending into the RIGHT upper quadrant. Gastrostomy tube in place. Overall bowel gas pattern is nonobstructive. IMPRESSION: 1. Persistent free intraperitoneal air. Yesterday's CT showed extensive free intraperitoneal air with concern for tethering of the colon between the stomach and anterior abdominal wall by the percutaneous gastrostomy tube. 2.  Nonobstructive bowel gas pattern. Electronically Signed   By: Franki Cabot M.D.   On: 08/13/2017 08:00   Dg Chest Port 1 View  Result Date: 08/12/2017 CLINICAL DATA:  Respiratory distress.  Tracheostomy. EXAM: PORTABLE CHEST 1 VIEW COMPARISON:  Chest x-rays dated 08/10/2017 and 08/09/2017. FINDINGS: New  free intraperitoneal air underlying both hemidiaphragms. Heart size and mediastinal contours are stable. Tracheostomy appears appropriately positioned in the midline. RIGHT-sided PICC line appears adequately positioned with tip in the mid/lower SVC. Vague opacity at the LEFT lung base, likely atelectasis and/or small pleural effusion. Probable additional mild atelectasis at the medial aspects of the RIGHT lung base. No pneumothorax seen. IMPRESSION: 1. New free intraperitoneal air. Ordering physician tells me that the patient had a gastrostomy tube placed 2 days ago, however, this is a large amount of air and I suspect bowel perforation and or gastrostomy tube complication. CT may help to determine the source. 2. Probable atelectasis and/or small pleural effusion at the LEFT lung base. 3. Probable mild atelectasis at the RIGHT lung base. These results were called by telephone at the time of interpretation on 08/12/2017 at 10:29 am to Dr. Cammie Sickle , who verbally acknowledged these results. Electronically Signed   By: Franki Cabot M.D.   On: 08/12/2017 10:37    EKG:   Orders placed or performed during the hospital encounter of 07/14/17  . ED EKG  . ED EKG  . EKG 12-Lead  . EKG 12-Lead    ASSESSMENT AND PLAN:   62 year old female with past medical history significant for chronic respiratory failure secondary to COPD and emphysema, known history of gastric ulcer presents to hospital secondary to weakness and melena.  1.  GI bleed- secondary to chronic gastric ulcer without hemorrhage or perforation. -Appreciate GI consult. -No active bleeding noted.  Hemoglobin is stable.   -Status post EGD  on 07/17/2017 showing normal duodenum, nonbleeding gastric ulcer noted.  Continue Protonix.  2.  Acute on chronic respiratory failure-secondary to hypoventilation from muscle weakness and COPD -Initially was on BiPAP, currently status post tracheostomy and 40% FiO2. -Continue trach collar trials every day.  3.  Moderate to severe malnutrition-appreciate dietitian consult. -Status post tracheostomy.  Failed swallow study. -PEG tube placed and currently on PEG tube feeds.  4.  Electrolyte imbalances-being managed by pharmacy  5.  Air under diaphragm post PEG tube placement-CT ordered with still intraperitoneal air, but no contrast extravasation. -Appreciate surgical consult.  PEG- Tube can be used -We will need surgery if develops any signs of peritonitis.  6.  Depression-on Remeron  7.  Hypothyroidism-continue thyroid supplements  8.  DVT prophylaxis-currently on teds and SCDs.   encourage to add heparin if hemoglobin is stable   Physical Therapy consult Might need SNF or LTAC  All the records are reviewed and case discussed with Care Management/Social Workerr. Management plans discussed with the patient, family and they are in agreement.  CODE STATUS: Full code  TOTAL TIME TAKING CARE OF THIS PATIENT: 35 minutes.   POSSIBLE D/C IN ? DAYS, DEPENDING ON CLINICAL CONDITION.   Gladstone Lighter M.D on 08/13/2017 at 10:01 AM  Between 7am to 6pm - Pager - [phone]  After 6pm go to [email or web address] - password [email or web address]  Office  [phone]  CC: Primary care physician; Patient, [name]

## 2017-08-13 NOTE — Progress Notes (Addendum)
Kayla Boyer has not urinated since early this AM. Night RN reported that she had ~146mls of urine out via external catheter and she had an incontinent episode that was not collected by the external catheter prior to shift change. She has not made any additional urinary output this shift. Dr. Celesta Aver notified, and she stated that she would round on the patient shortly to determine necessary treatment. Pt remains comfortable in bed with stable vs: BP 119/67   Pulse 80   Temp 98.2 F (36.8 C) (Oral)   Resp (!) 22   Ht 5' 2.5" (1.588 m)   Wt 52.7 kg (116 lb 2.9 oz)   SpO2 100%   BMI 20.91 kg/m .   Update: At time of bath, pt voided in brief.

## 2017-08-14 ENCOUNTER — Inpatient Hospital Stay: Payer: Medicaid Other

## 2017-08-14 DIAGNOSIS — Z931 Gastrostomy status: Secondary | ICD-10-CM

## 2017-08-14 LAB — CBC
HCT: 28.5 % — ABNORMAL LOW (ref 35.0–47.0)
Hemoglobin: 9.2 g/dL — ABNORMAL LOW (ref 12.0–16.0)
MCH: 29.3 pg (ref 26.0–34.0)
MCHC: 32.3 g/dL (ref 32.0–36.0)
MCV: 90.7 fL (ref 80.0–100.0)
Platelets: 388 10*3/uL (ref 150–440)
RBC: 3.14 MIL/uL — ABNORMAL LOW (ref 3.80–5.20)
RDW: 15.5 % — AB (ref 11.5–14.5)
WBC: 6.1 10*3/uL (ref 3.6–11.0)

## 2017-08-14 LAB — GLUCOSE, CAPILLARY
GLUCOSE-CAPILLARY: 73 mg/dL (ref 65–99)
GLUCOSE-CAPILLARY: 93 mg/dL (ref 65–99)
GLUCOSE-CAPILLARY: 98 mg/dL (ref 65–99)

## 2017-08-14 MED ORDER — IOPAMIDOL (ISOVUE-300) INJECTION 61%: 60.0000 mL | Freq: Once | INTRAVENOUS | Status: DC | PRN

## 2017-08-14 MED ORDER — IOHEXOL 300 MG/ML  SOLN
75.0000 mL | Freq: Once | INTRAMUSCULAR | Status: AC | PRN
  Administered 2017-08-14: 75 mL via INTRAVENOUS

## 2017-08-14 MED ORDER — BUDESONIDE 0.5 MG/2ML IN SUSP
0.5000 mg | Freq: Two times a day (BID) | RESPIRATORY_TRACT | Status: DC
  Administered 2017-08-14 – 2017-08-17 (×6): 0.5 mg via RESPIRATORY_TRACT
  Filled 2017-08-14 (×6): qty 2

## 2017-08-14 MED ORDER — ALBUTEROL SULFATE (2.5 MG/3ML) 0.083% IN NEBU
2.5000 mg | INHALATION_SOLUTION | RESPIRATORY_TRACT | Status: DC
  Administered 2017-08-14 – 2017-08-17 (×19): 2.5 mg via RESPIRATORY_TRACT
  Filled 2017-08-14 (×19): qty 3

## 2017-08-14 NOTE — Progress Notes (Signed)
Physical Therapy Treatment Patient Details Name: Kayla Boyer MRN: [id number] DOB: [birth year] Today's Date: 08/14/2017    History of Present Illness Kayla Boyer  is a 62 y.o. female with a known history of gastrointestinal bleeding, gastric ulcer, hyperlipidemia, emphysema of lung was referred by ER physician Dr.malinda for weakness and dark stools.  Patient presented to the emergency room with generalized weakness.  She has noticed a dark black stools for the last few days she was recently in our hospital last month and had an endoscopy which showed gastric ulcers she was discharged on oral proton pump inhibitors.  Patient is lethargic and weak.  Has nausea and vomitings.  No complaints of any chest pain, abdominal pain.  No fever and chills.Hemoglobin dropped from 11.7 to 9.7. Stool for occult blood is positive.  Diagnosis of GI bleed and then developed acute resp distress and placed on BIPAP and then required a ventilator. She is currently vent dependent and did not tolerate weaning over the weekend to a lower setting. PEG tube placed 08/10/17 per physician note.    PT Comments    Pt is making good progress towards goals and agreeable to [place] lift standing trials today. Able to position at EOB for comfort including towel rolls under B heels as pt unable to achieve neutral positioning on B ankles. Barrier also applied between PEG and lift pad. 2 successful attempts performed with [name] Lift to achieve standing with RN in room. Pt tolerated well and reported no discomfort. Again educated on use of Multipodus boots to improve ROM as well as tilting bed. Good endurance with there-ex and ROM. Will continue to progress as able.    Follow Up Recommendations  SNF     Equipment Recommendations  None recommended by PT    Recommendations for Other Services       Precautions / Restrictions Precautions Precautions: Fall    Mobility  Bed Mobility Overal bed mobility: Needs Assistance Bed  Mobility: Supine to Sit     Supine to sit: Max assist;+2 for physical assistance     General bed mobility comments: able to pull with R arm on railing and sit at EOB, still requires +2 assist for transition and decreases to cga for sitting EOB, fatigues quickly falling backwards needing max assist for repositioning.  Transfers Overall transfer level: Needs assistance   Transfers: Sit to/from Stand Sit to Stand: Max assist         General transfer comment: used sabina lift to perform standing x 2 attempts. Pt tolerated well. Towels positioned under heels and towel added over PEG tube for comfort. RN in room for line management plus 2 PT for lift equipment. Fatigues rapidly.  Ambulation/Gait             General Gait Details: unable to tolerate   Stairs             Wheelchair Mobility    Modified Rankin (Stroke Patients Only)       Balance                                            Cognition Arousal/Alertness: Awake/alert Behavior During Therapy: WFL for tasks assessed/performed Overall Cognitive Status: Within Functional Limits for tasks assessed  Exercises Other Exercises Other Exercises: supine ther-ex performed on B LE including ankle pumps (active R and AA on L), SLRs, and quad sets. All ther-ex performed x 15 reps with mod/max asisst. B gastroc stretches for improved AAROM. L ankle PF at 36 degrees and R ankle PF at 26 degrees.     General Comments        Pertinent Vitals/Pain Pain Assessment: Faces Faces Pain Scale: Hurts a little bit Pain Location: abdomen Pain Descriptors / Indicators: Operative site guarding Pain Intervention(s): Limited activity within patient's tolerance;Repositioned    Home Living                      Prior Function            PT Goals (current goals can now be found in the care plan section) Acute Rehab PT Goals Patient Stated Goal:  "to get stronger and take care of myself" PT Goal Formulation: With patient Time For Goal Achievement: 08/25/17 Potential to Achieve Goals: Good Progress towards PT goals: Progressing toward goals    Frequency    Min 2X/week      PT Plan Current plan remains appropriate    Co-evaluation              AM-PAC PT "6 Clicks" Daily Activity  Outcome Measure  Difficulty turning over in bed (including adjusting bedclothes, sheets and blankets)?: Unable Difficulty moving from lying on back to sitting on the side of the bed? : Unable Difficulty sitting down on and standing up from a chair with arms (e.g., wheelchair, bedside commode, etc,.)?: Unable Help needed moving to and from a bed to chair (including a wheelchair)?: Total Help needed walking in hospital room?: Total Help needed climbing 3-5 steps with a railing? : Total 6 Click Score: 6    End of Session Equipment Utilized During Treatment: (vent and lift) Activity Tolerance: Patient limited by fatigue Patient left: in bed;with bed alarm set Nurse Communication: Mobility status PT Visit Diagnosis: Muscle weakness (generalized) (M62.81);Difficulty in walking, not elsewhere classified (R26.2)     Time: 2924-4628 PT Time Calculation (min) (ACUTE ONLY): 54 min  Charges:  $Therapeutic Exercise: 23-37 mins $Therapeutic Activity: 23-37 mins                    G Codes:       Greggory Stallion, PT, DPT [phone]    [name] 08/14/2017, 11:43 AM

## 2017-08-14 NOTE — Progress Notes (Signed)
Pt was transported to CT and back to CCU while on the vent. °

## 2017-08-14 NOTE — Progress Notes (Signed)
PT has had two large bowel movements this shift. Tube feedings have been adjusted to goal rate of 53mL. Pt tolerating tube feeds. VSS all shift. Pt remains alert and oriented.

## 2017-08-14 NOTE — Progress Notes (Signed)
[facility] NAME: Kayla Boyer    MR#:  [id number]  DATE OF BIRTH:  [birth year]  SUBJECTIVE:  CHIEF COMPLAINT:   Chief Complaint  Patient presents with  . Weakness   -Status post trach and PEG tube. -Alert and communicates by writing. -Doing well.  Very excited as she was able to stand up with a stand lift trial today -SBT daily  REVIEW OF SYSTEMS:  Review of Systems  Constitutional: Positive for malaise/fatigue. Negative for chills and fever.  HENT: Negative for congestion, ear discharge, hearing loss and nosebleeds.   Respiratory: Positive for shortness of breath. Negative for cough and wheezing.   Cardiovascular: Negative for chest pain, palpitations and leg swelling.  Gastrointestinal: Positive for abdominal pain. Negative for constipation, diarrhea, nausea and vomiting.  Genitourinary: Negative for dysuria.  Neurological: Positive for weakness. Negative for dizziness, seizures and headaches.    DRUG ALLERGIES:  No Known Allergies  VITALS:  Blood pressure (!) 92/58, pulse 80, temperature 98.7 F (37.1 C), temperature source Oral, resp. rate 15, height 5' 2.5" (1.588 m), weight 52.7 kg (116 lb 2.9 oz), SpO2 100 %.  PHYSICAL EXAMINATION:  Physical Exam  GENERAL:  62 y.o.-year-old patient lying in the bed with no acute distress.  EYES: Pupils equal, round, reactive to light and accommodation. No scleral icterus. Extraocular muscles intact.  HEENT: Head atraumatic, normocephalic. Oropharynx and nasopharynx clear.  NECK:  Supple, no jugular venous distention. No thyroid enlargement, no tenderness.  Tracheostomy in place and connected to vent LUNGS: Normal breath sounds bilaterally, no wheezing, rales,rhonchi or crepitation. No use of accessory muscles of respiration.  Decreased bibasilar breath sounds. CARDIOVASCULAR: S1, S2 normal. No murmurs, rubs, or gallops.  ABDOMEN: Soft, nontender, nondistended.  PEG tube in  place.  Bowel sounds present. No organomegaly or mass.  EXTREMITIES: No pedal edema, cyanosis, or clubbing.  NEUROLOGIC: Cranial nerves II through XII are intact. Muscle strength 5/5 in all extremities. Sensation intact. Gait not checked.  Global weakness noted. PSYCHIATRIC: The patient is alert and oriented x 3.  SKIN: No obvious rash, lesion, or ulcer.    LABORATORY PANEL:   CBC Recent Labs  Lab 08/14/17 0559  WBC 6.1  HGB 9.2*  HCT 28.5*  PLT 388   ------------------------------------------------------------------------------------------------------------------  Chemistries  Recent Labs  Lab 08/10/17 0647  NA 139  K 3.9  CL 101  CO2 32  GLUCOSE 83  BUN 16  CREATININE <0.30*  CALCIUM 9.3   ------------------------------------------------------------------------------------------------------------------  Cardiac Enzymes No results for input(s): TROPONINI in the last 168 hours. ------------------------------------------------------------------------------------------------------------------  RADIOLOGY:  Ct Abdomen Pelvis Wo Contrast  Addendum Date: 08/12/2017   ADDENDUM REPORT: 08/12/2017 17:22 ADDENDUM: Gastrostomy tube is positioned in the stomach but there is concern for a small amount of tissue between the anterior stomach wall and the abdominal wall. There is a small amount of gas within this soft tissue and findings raise concern that a small segment of colon is tethered between the stomach and the anterior abdominal wall. Soft tissue is seen on sequence 2, image 40. The concern for colonic involvement is also seen on sequence 2, image 41. Recommend follow-up gastric tube injection under fluoroscopy. These results were called by telephone at the time of interpretation on 08/12/2017 at 5:16 pm to Dr. Cammie Sickle , who verbally acknowledged these results. Electronically Signed   By: Markus Daft M.D.   On: 08/12/2017 17:22   Result Date: 08/12/2017 CLINICAL DATA:  Abnormal x-ray showing possible free air. Peg tube placement 2 days ago. Recent GI bleed. EXAM: CT ABDOMEN AND PELVIS WITHOUT CONTRAST TECHNIQUE: Multidetector CT imaging of the abdomen and pelvis was performed following the standard protocol without IV contrast. COMPARISON:  CT abdomen dated 07/04/2017. FINDINGS: Lower chest: New dense consolidations at both lung bases, pneumonia versus atelectasis, and small bilateral pleural effusions. Hepatobiliary: Gallbladder appears to be filled with stones but there are no inflammatory changes about the gallbladder to suggest acute cholecystitis. No focal liver abnormality is seen. No bile duct dilatation seen. Pancreas: Unremarkable. No pancreatic ductal dilatation or surrounding inflammatory changes. Spleen: Normal in size without focal abnormality. Adrenals/Urinary Tract: Adrenal glands appear normal. Small nonobstructing renal stones bilaterally. No ureteral or bladder calculi identified. Bladder appears normal. Stomach/Bowel: Peg tube appears grossly well-positioned. Stomach otherwise unremarkable. No dilated large or small bowel loops. Appendix is normal. Vascular/Lymphatic: Mild aortic atherosclerosis. No enlarged lymph nodes seen in the abdomen or pelvis. Reproductive: Uterus and bilateral adnexa are unremarkable. Other: Large amount of free intraperitoneal air within the anterior abdomen, centered within the upper abdomen adjacent to the stomach. No extraluminal oral contrast material to confirm bowel perforation. No free fluid or abscess collection. Musculoskeletal: No acute or suspicious osseous finding. Air is seen extending from the anterior abdominal wall to the umbilicus near the skin surface, presumably within a small periumbilical hernia. IMPRESSION: 1. Large amount of free intraperitoneal air within the anterior abdomen, centered about the stomach where there was a recent PEG tube placement. Gastrostomy tube appears appropriately positioned in the stomach.  No evidence of gastrostomy tube related complication. No source for bowel perforation identified and no extraluminal oral contrast material to confirm an active perforation. The free intraperitoneal air may be benign and related to the recent PEG tube placement, although more prominent than is typically seen. If clinically stable, recommend follow-up plain film examinations to ensure resolution. 2. Small amount of air is extending from the intraperitoneal cavity to the periumbilical abdominal wall, extending near the skin surface, presumably extension of the free intraperitoneal air into a periumbilical abdominal wall hernia. Recommend physical examination of the umbilicus to exclude the less likely possibility of patent fistulous tract. 3. New dense consolidation within the LEFT lower lung, at least moderate in size and incompletely imaged, pneumonia versus atelectasis/airspace collapse with small adjacent pleural effusion. Additional consolidation at the RIGHT lung base, also incompletely imaged, likely a combination of atelectasis and pleural effusion. 4. Cholelithiasis without evidence of acute cholecystitis. 5. Bilateral nephrolithiasis (tiny stones) without hydronephrosis. 6. Aortic atherosclerosis. Electronically Signed: By: Franki Cabot M.D. On: 08/12/2017 14:13   Dg Abd 1 View  Result Date: 08/13/2017 CLINICAL DATA:  Status post insertion of percutaneous endoscopic gastrostomy. EXAM: ABDOMEN - 1 VIEW COMPARISON:  None. FINDINGS: Again noted is a large amount of free intraperitoneal air within the upper abdomen, most prominent about the stomach and extending into the RIGHT upper quadrant. Gastrostomy tube in place. Overall bowel gas pattern is nonobstructive. IMPRESSION: 1. Persistent free intraperitoneal air. Yesterday's CT showed extensive free intraperitoneal air with concern for tethering of the colon between the stomach and anterior abdominal wall by the percutaneous gastrostomy tube. 2.  Nonobstructive bowel gas pattern. Electronically Signed   By: Franki Cabot M.D.   On: 08/13/2017 08:00    EKG:   Orders placed or performed during the hospital encounter of 07/14/17  . ED EKG  . ED EKG  . EKG 12-Lead  . EKG 12-Lead  ASSESSMENT AND PLAN:   62 year old female with past medical history significant for chronic respiratory failure secondary to COPD and emphysema, known history of gastric ulcer presents to hospital secondary to weakness and melena.  1.  GI bleed- secondary to chronic gastric ulcer without hemorrhage or perforation. -Appreciate GI consult. -No active bleeding noted.  Hemoglobin is stable.   -Status post EGD on 07/17/2017 showing normal duodenum, nonbleeding gastric ulcer noted.  Continue Protonix.  2.  Acute on chronic respiratory failure-secondary to hypoventilation from muscle weakness and COPD -Initially was on BiPAP, and intubated and currently status post tracheostomy and 40% FiO2. -Continue trach collar trials every day.  3.  Moderate to severe malnutrition-appreciate dietitian consult. -Status post tracheostomy.  Failed swallow study. -PEG tube placed and currently on PEG tube feeds.  4.  Electrolyte imbalances-being managed by pharmacy  5.  Air under diaphragm post PEG tube placement-CT ordered with still intraperitoneal air, but no contrast extravasation.  KUBs as needed -Tolerating tube feeds well.  No signs of peritonitis -Appreciate surgical consult.  -We will need surgery if develops any signs of peritonitis.  6.  Depression-on Remeron  7.  Hypothyroidism-continue thyroid supplements  8.  DVT prophylaxis-currently on teds and SCDs.   encourage to add heparin if hemoglobin is stable   Physical Therapy consult Might need SNF or LTAC  All the records are reviewed and case discussed with Care Management/Social Workerr. Management plans discussed with the patient, family and they are in agreement.  CODE STATUS: Full code  TOTAL  TIME TAKING CARE OF THIS PATIENT: 35 minutes.   POSSIBLE D/C IN ? DAYS, DEPENDING ON CLINICAL CONDITION.   Gladstone Lighter M.D on 08/14/2017 at 12:49 PM  Between 7am to 6pm - Pager - [phone]  After 6pm go to [email or web address] - password [email or web address] [facility]  Office  [phone]  CC: Primary care physician; Patient, [name]

## 2017-08-14 NOTE — Progress Notes (Signed)
Vonda Antigua, MD [address] [place], [place], [place], [address] [address], [place], [place], [place], [address] Phone: [phone]  Fax: [phone]   Subjective:  Patient denies any abdominal pain.  Tolerating feeds through PEG tube.  No nausea or vomiting.  Objective: Exam: Vital signs in last 24 hours: Vitals:   08/14/17 1100 08/14/17 1200 08/14/17 1300 08/14/17 1400  BP: 109/73 116/77 109/69 124/84  Pulse: 84 81 74   Resp: 17 (!) 26 20 20   Temp:      TempSrc:      SpO2: 100% 100% 100%   Weight:      Height:       Weight change:   Intake/Output Summary (Last 24 hours) at 08/14/2017 1513 Last data filed at 08/14/2017 0700 Gross per 24 hour  Intake 1220 ml  Output 450 ml  Net 770 ml    General: No acute distress, AAO x3 Abd: Soft, NT/ND, No HSM.  PEG tube in place.  External bumper in place, close to the skin, but not tight.  No erythema or tenderness at the site. Skin: Warm, no rashes Neck: Supple, Trachea midline   Lab Results: Lab Results  Component Value Date   WBC 6.1 08/14/2017   HGB 9.2 (L) 08/14/2017   HCT 28.5 (L) 08/14/2017   MCV 90.7 08/14/2017   PLT 388 08/14/2017   Micro Results: Recent Results (from the past 240 hour(s))  Culture, respiratory (NON-Expectorated)     Status: None   Collection Time: 08/06/17  9:32 PM  Result Value Ref Range Status   Specimen Description   Final    INDUCED SPUTUM Performed at [facility], [address] [place], [place] [address]    Special Requests   Final    NONE Performed at [facility], [address] [place], [place] [address]    Gram Stain   Final    RARE WBC PRESENT, PREDOMINANTLY PMN NO ORGANISMS SEEN    Culture   Final    RARE Consistent with normal respiratory flora. Performed at [facility], [place] [address], [place], [place] [address]    Report Status 08/09/2017 FINAL  Final  Culture, respiratory (NON-Expectorated)     Status: None   Collection Time: 08/09/17  3:24 PM  Result Value Ref Range Status   Specimen Description   Final    TRACHEAL ASPIRATE Performed at [facility], [address], [place], [place] [address]    Special Requests   Final    Normal Performed at [facility], [address] [place], [place] [address]    Gram Stain   Final    ABUNDANT WBC PRESENT,BOTH PMN AND MONONUCLEAR NO ORGANISMS SEEN Performed at [facility], [place] [address], [place], [place] [address]    Culture RARE STAPHYLOCOCCUS SPECIES (COAGULASE NEGATIVE)  Final   Report Status 08/12/2017 FINAL  Final   Organism ID, Bacteria STAPHYLOCOCCUS SPECIES (COAGULASE NEGATIVE)  Final      Susceptibility   Staphylococcus species (coagulase negative) - MIC*    CIPROFLOXACIN 4 RESISTANT Resistant     ERYTHROMYCIN >=8 RESISTANT Resistant     GENTAMICIN <=0.5 SENSITIVE Sensitive     OXACILLIN >=4 RESISTANT Resistant     TETRACYCLINE <=1 SENSITIVE Sensitive     VANCOMYCIN 2 SENSITIVE Sensitive     TRIMETH/SULFA <=10 SENSITIVE Sensitive     CLINDAMYCIN <=0.25 SENSITIVE Sensitive     RIFAMPIN <=0.5 SENSITIVE Sensitive     Inducible  Clindamycin NEGATIVE Sensitive     * RARE STAPHYLOCOCCUS SPECIES (COAGULASE NEGATIVE)   Studies/Results: Dg Abd 1 View  Result Date: 08/13/2017 CLINICAL DATA:  Status post insertion of percutaneous endoscopic gastrostomy. EXAM: ABDOMEN - 1 VIEW COMPARISON:  None. FINDINGS: Again noted is a large amount of free intraperitoneal air within the upper abdomen, most prominent about the stomach and extending into the RIGHT upper quadrant. Gastrostomy tube in place. Overall bowel gas pattern is nonobstructive. IMPRESSION: 1. Persistent free intraperitoneal air. Yesterday's CT showed extensive free intraperitoneal air with concern for tethering of the colon between the stomach and anterior abdominal wall by the percutaneous gastrostomy tube. 2. Nonobstructive bowel gas pattern. Electronically Signed    By: Franki Cabot M.D.   On: 08/13/2017 08:00   Medications:  Scheduled Meds: . albuterol  2.5 mg Nebulization Q4H  . budesonide (PULMICORT) nebulizer solution  0.5 mg Nebulization BID  . chlorhexidine  15 mL Mouth Rinse BID  . feeding supplement (PRO-STAT SUGAR FREE 64)  30 mL Per Tube BID  . free water  100 mL Per Tube Q6H  . liver oil-zinc oxide   Topical Daily  . mouth rinse  15 mL Mouth Rinse QID  . mirtazapine  15 mg Per Tube QHS  . pantoprazole sodium  40 mg Per Tube Daily  . senna-docusate  2 tablet Oral BID  . sodium chloride flush  10-40 mL Intracatheter Q12H  . thyroid  30 mg Per Tube QAC breakfast   Continuous Infusions: . feeding supplement (JEVITY 1.2 CAL) 20 mL/hr at 08/14/17 0700   PRN Meds:.acetaminophen, ALPRAZolam, bisacodyl, diphenhydrAMINE, morphine injection, [DISCONTINUED] ondansetron **OR** ondansetron (ZOFRAN) IV, simethicone, sodium chloride flush   Assessment: PEG tube   Plan: I discussed the CT results and addendum with Dr. Enriqueta Shutter from radiology who read the initial CT scan They state, that they see soft tissue between the tube and the abdominal wall on the CT scan.  However, he states the finding is not completely convincing for colon involvement.    We discussed, that her clinical exam is not consistent with perforation which is reassuring.  Surgery is also about with the patient, and do not suspect perforation and did not recommend any surgical intervention  However, there are cases of colocutaneous fistulas which can be asymptomatic.  For example, as per up-to-date "A colocutaneous fistula is a rare complication associated with percutaneous gastrostomy tube placement. It occurs as a result of interposition of bowel, usually the splenic flexure, between the anterior abdominal wall and the gastric wall. The gastrostomy tube is placed directly through the bowel into the stomach. Patients in whom this complication has occurred are often asymptomatic,  except for transient fever or ileus. This complication can often be treated by removing the gastrostomy tube and allowing the fistula to close. However, surgery is sometimes necessary to correct the internal gastric-bowel fistula. The problem is usually discovered months after initial gastrostomy tube placement when the original gastrostomy tube is removed for gastrostomy tube replacement. As the replacement gastrostomy tube is passed blindly at the bedside, it is pushed through the gastrostomy tract opening in the abdominal wall and into the colon, but cannot find its way back into the stomach. Once the tube feedings are restarted, the patient develops diarrhea from colonic tube feedings and dehydration from not receiving fluids or nutrition."  To avoid this possibility, and rule out colocutaneous fistula we discussed, what would be the next best step to completely rule out involvement of other structures. Dr.  Enriqueta Shutter, suggested barium enema with water-soluble contrast..  I discussed this, with the radiologist, Dr. Posey Pronto, who is present in house today, and would be doing the radiologic studies.  He suggested doing a CT abdomen with rectal contrast instead.  He states, it would be a better study, as it would allow to evaluate minuscule details in the abdomen and evaluate the anatomy better than a barium enema.  He states a barium enema may not leak, even if the tube is through the colon, and might not our answer our question fully.  I have just ordered CT abdomen rectal contrast  I discussed this with my partners who placed the tube as well, and they are in agreement with this plan. I have also discussed this with Dr. Mortimer Fries, with the intensivist, and he agrees with proceeding with this as well.    LOS: 31 days   Vonda Antigua, MD 08/14/2017, 3:13 PM

## 2017-08-14 NOTE — Clinical Social Work Note (Signed)
CSW received call from [facility] sister facility: [facility]. Sonia Baller with [facility] stated that they believe that they can take patient and requested additional information for which CSW provided. Will await their disposition.  Shela Leff MSW,LCSW [phone]

## 2017-08-14 NOTE — Progress Notes (Signed)
OT Cancellation Note  Patient Details Name: Kayla Boyer MRN: [id number] DOB: 1956/04/02   Cancelled Treatment:    Reason Eval/Treat Not Completed: Patient at procedure or test/ unavailable. Pt receiving CT, unavailable for OT treatment. Will re-attempt next date.   Jeni Salles, MPH, MS, OTR/L ascom [phone] 08/14/17, 4:14 PM

## 2017-08-14 NOTE — Progress Notes (Signed)
SURGICAL PROGRESS NOTE (cpt [id number])  Hospital Day(s): 31.   Post op day(s): 18 Days Post-Op.   Interval History: Patient seen and examined, no acute events or new complaints overnight. Though essentially non-verbal s/p recent tracheostomy and placement of PEG for nutritional access with tube feeds ongoing, patient affirms tolerating tube feeds while she similarly denies any abdominal pain or discomfort otherwise, denies fever/chills, N/V, CP, or SOB.  Review of Systems:  Constitutional: denies fever, chills  HEENT: denies cough or congestion  Respiratory: denies any shortness of breath  Cardiovascular: denies chest pain or palpitations  Gastrointestinal: denies abdominal pain or N/V Genitourinary: denies burning with urination or urinary frequency Musculoskeletal: denies pain, decreased motor or sensation Integumentary: denies any other rashes or skin discolorations except PEG and tracheostomy insertion sites Neurological: denies HA or vision/hearing changes   Vital signs in last 24 hours: [min-max] current  Temp:  [98.1 F (36.7 C)-98.5 F (36.9 C)] 98.5 F (36.9 C) (04/14 1704) Pulse Rate:  [71-88] 77 (04/14 2200) Resp:  [15-22] 15 (04/14 2200) BP: (104-143)/(58-110) 117/69 (04/14 2200) SpO2:  [100 %] 100 % (04/15 0326) FiO2 (%):  [40 %] 40 % (04/15 0326)     Height: 5' 2.5" (158.8 cm) Weight: 116 lb 2.9 oz (52.7 kg) BMI (Calculated): 20.9   Intake/Output this shift:  No intake/output data recorded.   Intake/Output last 2 shifts:  @IOLAST2SHIFTS @   Physical Exam:  Constitutional: alert, cooperative and no distress  HENT: normocephalic without obvious abnormality  Eyes: PERRL, EOM's grossly intact and symmetric  Neuro: CN II - XII grossly intact and symmetric without deficit  Respiratory: breathing non-labored at rest  Cardiovascular: regular rate and sinus rhythm  Gastrointestinal: soft, non-tender, and non-distended with recently placed PEG  well-secured Musculoskeletal: UE and LE FROM, no edema or wounds, motor and sensation grossly intact, NT   Labs:  CBC Latest Ref Rng & Units 08/14/2017 08/12/2017 08/11/2017  WBC 3.6 - 11.0 K/uL 6.1 8.3 9.5  Hemoglobin 12.0 - 16.0 g/dL 9.2(L) 9.1(L) 9.8(L)  Hematocrit 35.0 - 47.0 % 28.5(L) 28.6(L) 29.8(L)  Platelets 150 - 440 K/uL 388 346 369   CMP Latest Ref Rng & Units 08/10/2017 08/07/2017 08/06/2017  Glucose 65 - 99 mg/dL 83 115(H) 99  BUN 6 - 20 mg/dL 16 21(H) 18  Creatinine 0.44 - 1.00 mg/dL <0.30(L) <0.30(L) <0.30(L)  Sodium 135 - 145 mmol/L 139 139 138  Potassium 3.5 - 5.1 mmol/L 3.9 4.0 3.6  Chloride 101 - 111 mmol/L 101 100(L) 97(L)  CO2 22 - 32 mmol/L 32 33(H) 33(H)  Calcium 8.9 - 10.3 mg/dL 9.3 9.2 9.4  Total Protein 6.5 - 8.1 g/dL - - -  Total Bilirubin 0.3 - 1.2 mg/dL - - -  Alkaline Phos 38 - 126 U/L - - -  AST 15 - 41 U/L - - -  ALT 14 - 54 U/L - - -   Imaging studies:  CT Abdomen and Pelvis with Contrast (08/14/2017) - personally reviewed and compared to prior study 1. Appropriately positioned gastrostomy tube within the stomach. No violation of the colon. No enteric contrast extravasation from the colon. 2. Slight interval decrease in amount of free intraperitoneal air. 3. Unchanged bilateral punctate nonobstructive nephrolithiasis. 4. Cholelithiasis. 5. Unchanged small bilateral pleural effusions with adjacent lower lobe atelectasis. 6.  Aortic atherosclerosis   Assessment/Plan: (ICD-10's: Z93.1) 62 y.o. female with no evidence of colonic perforation s/p PEG placement.   - no indication for surgical intervention   - okay to continue  tube feeds as per primary team   - medical management of comorbidities as per primary team   - will signoff, please call if questions or concerns   All of the above findings and recommendations were discussed with the patient and the ICU team.  Thank you for the opportunity to participate in this patient's care.  -- Marilynne Drivers  Rosana Hoes, MD, [facility] - Partnering for exceptional care. Office: [phone]

## 2017-08-14 NOTE — Progress Notes (Signed)
  [facility] Progess Note  CC prolonged ventilator dependence, tracheostomy status  HPI Remains cognitively intact.   Follows commands briskly.  Good strength in her extremities.  Tolerates pressure support of 15 cm H2O yesterday.  04/13 Patient desaturated when placed on SBT this AM; CXR done which showed large amount of air below diaphragm out of proportion from expected from G-Tube placement.  Hence CT abdomen and pelvis done which showed G-Tube in good position without perforation.  04/14 Patient has been seen by the surgical team who also reviewed her films and saw no perforation.  She tolerated SBT today.  VENTILATOR SETTINGS: Vent Mode: PRVC FiO2 (%):  [40 %] 40 % Set Rate:  [15 bmp] 15 bmp Vt Set:  [450 mL] 450 mL PEEP:  [5 cmH20] 5 cmH20 Pressure Support:  [10 cmH20] 10 cmH20 Plateau Pressure:  [17 cmH20-19 cmH20] 19 cmH20  INTAKE / OUTPUT:  Intake/Output Summary (Last 24 hours) at 08/14/2017 0928 Last data filed at 08/14/2017 0700 Gross per 24 hour  Intake 1736.33 ml  Output 450 ml  Net 1286.33 ml    Name: Kayla Boyer MRN: [id number] DOB: April 04, 1956    ADMISSION DATE:  07/14/2017   VITAL SIGNS: Temp:  [98.2 F (36.8 C)-98.7 F (37.1 C)] 98.7 F (37.1 C) (04/14 2000) Pulse Rate:  [66-88] 80 (04/15 0700) Resp:  [13-22] 15 (04/15 0700) BP: (89-139)/(50-93) 92/58 (04/15 0700) SpO2:  [99 %-100 %] 100 % (04/15 0700) FiO2 (%):  [40 %] 40 % (04/15 0326)  PHYSICAL EXAMINATION: Physical Examination:   VS: BP (!) 92/58   Pulse 80   Temp 98.7 F (37.1 C) (Oral)   Resp 15   Ht 5' 2.5" (1.588 m)   Wt 116 lb 2.9 oz (52.7 kg)   SpO2 100%   BMI 20.91 kg/m   Gen: Awake, alert and appropriate HEENT: , sclerae white Neck: Tracheostomy site clean Lungs: Improved breath sounds on the right, few right basilar crackles, no wheezes Cardiovascular: Regular, no M Abdomen: Soft, NT, NABS, peg site clean Ext: 1+ oedema Neuro: No focal  deficits, global weakness. L>R     LABORATORY PANEL:   CBC Recent Labs  Lab 08/14/17 0559  WBC 6.1  HGB 9.2*  HCT 28.5*  PLT 388    Chemistries  Recent Labs  Lab 08/10/17 0647  NA 139  K 3.9  CL 101  CO2 32  GLUCOSE 83  BUN 16  CREATININE <0.30*  CALCIUM 9.3    Recent Labs  Lab 08/12/17 1754 08/12/17 2357 08/13/17 0557 08/13/17 1140 08/13/17 1720 08/14/17 0022  GLUCAP 101* 81 75 96 79 93     ASSESSMENT/PLAN  Prolonged VDRF - SBT as tolerated  Chronic hypercarbic respiratory failure RLL collapse, resolved Tracheostomy Atelectasis PEG in situ  Benign intraperitoneal air post peg tube placement- lactic acid negative, wbc normal, pt non toxic, seen by surgeon- no perforation seen so TF restarted Recent GIB - resolved Deconditioning  Cont vent support - aggressive vent trials Continue to wean in PSV as tolerated Continue airway hygiene with chest percussion, nebulized  Continue PPI therapy Continue nutritional support-  TF restarted Continue PT/OT Will monitor intraperitoneal air with KUB /CXR intermittently Working on placement.  Will need vent SNF versus LTAC    Corrin Parker, M.D.  [facility]  Medical Director [place] Director [facility] Cardio-Pulmonary Department

## 2017-08-15 DIAGNOSIS — Z931 Gastrostomy status: Secondary | ICD-10-CM

## 2017-08-15 LAB — GLUCOSE, CAPILLARY
GLUCOSE-CAPILLARY: 106 mg/dL — AB (ref 65–99)
GLUCOSE-CAPILLARY: 120 mg/dL — AB (ref 65–99)
Glucose-Capillary: 89 mg/dL (ref 65–99)
Glucose-Capillary: 97 mg/dL (ref 65–99)

## 2017-08-15 LAB — MAGNESIUM: Magnesium: 1.4 mg/dL — ABNORMAL LOW (ref 1.7–2.4)

## 2017-08-15 LAB — BASIC METABOLIC PANEL
Anion gap: 4 — ABNORMAL LOW (ref 5–15)
BUN: 11 mg/dL (ref 6–20)
CO2: 30 mmol/L (ref 22–32)
Calcium: 8.8 mg/dL — ABNORMAL LOW (ref 8.9–10.3)
Chloride: 103 mmol/L (ref 101–111)
Glucose, Bld: 119 mg/dL — ABNORMAL HIGH (ref 65–99)
POTASSIUM: 3.1 mmol/L — AB (ref 3.5–5.1)
Sodium: 137 mmol/L (ref 135–145)

## 2017-08-15 LAB — PHOSPHORUS: Phosphorus: 3.9 mg/dL (ref 2.5–4.6)

## 2017-08-15 MED ORDER — MAGNESIUM SULFATE 4 GM/100ML IV SOLN
4.0000 g | Freq: Once | INTRAVENOUS | Status: AC
Start: 2017-08-15 — End: 2017-08-15
  Administered 2017-08-15: 4 g via INTRAVENOUS
  Filled 2017-08-15: qty 100

## 2017-08-15 MED ORDER — POTASSIUM CHLORIDE 20 MEQ PO PACK
40.0000 meq | PACK | ORAL | Status: AC
  Administered 2017-08-15 (×2): 40 meq via ORAL
  Filled 2017-08-15: qty 2

## 2017-08-15 NOTE — Clinical Social Work Note (Signed)
Initially CSW was informed that patient had not received her PEG placement as expected last week. This has since been corrected and patient does have a PEG in place. RN CM has been informed by Dr. Mortimer Fries that patient is stable for discharge when arrangements can be made. CSW has spoken to patient's son and he is aware that [facility] can take patient and is aware transfer could take place in 24-48 hours. CSW has contacted Sonia Baller at the facility and she will be discussing arrangements with son and updating CSW. Shela Leff MSW,LCSW [phone]

## 2017-08-15 NOTE — Progress Notes (Signed)
Vonda Antigua, MD [address], [place], [place], [place], [address] [address], [place], [place], [place], [address] Phone: [phone]  Fax: [phone]   Subjective: Patient sitting up in bed comfortably.  Denies any abdominal pain.  No nausea vomiting.  Tolerating PEG tube feeds without problem   Objective: Exam: Vital signs in last 24 hours: Vitals:   08/15/17 1800 08/15/17 1900 08/15/17 1956 08/15/17 2000  BP: (!) 102/54 91/62  91/65  Pulse: 89 100  98  Resp: 14 18  14   Temp: 98.6 F (37 C)   98.1 F (36.7 C)  TempSrc:    Oral  SpO2: 100% 100% 100% 100%  Weight:      Height:       Weight change:   Intake/Output Summary (Last 24 hours) at 08/15/2017 2115 Last data filed at 08/15/2017 2000 Gross per 24 hour  Intake 500 ml  Output 1850 ml  Net -1350 ml    General: No acute distress, AAO x3 Abd: Soft, NT/ND, No HSM PEG tube site examined, no erythema, no discharge, no tenderness. PEG tube external bumper loosened, and left leg just below 5-1/2 cm mark, just loosely touching the skin, about 1 cm/one finger width away from the skin Skin: Warm, no rashes Neck: Supple, Trachea midline   Lab Results: Lab Results  Component Value Date   WBC 6.1 08/14/2017   HGB 9.2 (L) 08/14/2017   HCT 28.5 (L) 08/14/2017   MCV 90.7 08/14/2017   PLT 388 08/14/2017   Micro Results: Recent Results (from the past 240 hour(s))  Culture, respiratory (NON-Expectorated)     Status: None   Collection Time: 08/06/17  9:32 PM  Result Value Ref Range Status   Specimen Description   Final    INDUCED SPUTUM Performed at [facility], [address] [place], [place] [address]    Special Requests   Final    NONE Performed at [facility], [address] [place], [place] [address]    Gram Stain   Final    RARE WBC PRESENT, PREDOMINANTLY PMN NO ORGANISMS SEEN    Culture   Final    RARE Consistent with normal respiratory flora. Performed at [facility], [place] [address], [place], [place] [address]    Report Status 08/09/2017 FINAL  Final  Culture, respiratory (NON-Expectorated)     Status: None   Collection Time: 08/09/17  3:24 PM  Result Value Ref Range Status   Specimen Description   Final    TRACHEAL ASPIRATE Performed at [facility], [address], [place], [place] [address]    Special Requests   Final    Normal Performed at [facility], [address] [place], [place] [address]    Gram Stain   Final    ABUNDANT WBC PRESENT,BOTH PMN AND MONONUCLEAR NO ORGANISMS SEEN Performed at [facility], [place] [address] [place], [place] [address]    Culture RARE STAPHYLOCOCCUS SPECIES (COAGULASE NEGATIVE)  Final   Report Status 08/12/2017 FINAL  Final   Organism ID, Bacteria STAPHYLOCOCCUS SPECIES (COAGULASE NEGATIVE)  Final      Susceptibility   Staphylococcus species (coagulase negative) - MIC*    CIPROFLOXACIN 4 RESISTANT Resistant     ERYTHROMYCIN >=8 RESISTANT Resistant     GENTAMICIN <=0.5 SENSITIVE Sensitive     OXACILLIN >=4 RESISTANT Resistant     TETRACYCLINE <=1 SENSITIVE Sensitive     VANCOMYCIN 2 SENSITIVE Sensitive  TRIMETH/SULFA <=10 SENSITIVE Sensitive     CLINDAMYCIN <=0.25 SENSITIVE Sensitive     RIFAMPIN <=0.5 SENSITIVE Sensitive     Inducible Clindamycin NEGATIVE Sensitive     * RARE STAPHYLOCOCCUS SPECIES (COAGULASE NEGATIVE)   Studies/Results: Ct Abdomen Pelvis W Contrast  Result Date: 08/14/2017 CLINICAL DATA:  Gastrostomy tube placement 4 days ago. Concern that gastrostomy placement may have violated the colon. EXAM: CT ABDOMEN AND PELVIS WITH CONTRAST TECHNIQUE: Multidetector CT imaging of the abdomen and pelvis was performed using the standard protocol following bolus administration of intravenous contrast. Water soluble rectal contrast was also administered. CONTRAST:  70mL OMNIPAQUE IOHEXOL 300 MG/ML  SOLN COMPARISON:  CT abdomen pelvis dated August 12, 2017.  FINDINGS: Lower chest: Unchanged small bilateral pleural effusions with adjacent lower lobe atelectasis. Hepatobiliary: No focal liver abnormality. Cholelithiasis. No gallbladder wall thickening or biliary dilatation. Pancreas: Unremarkable. No pancreatic ductal dilatation or surrounding inflammatory changes. Spleen: Normal in size without focal abnormality. Adrenals/Urinary Tract: The adrenal glands are unremarkable. Unchanged punctate bilateral renal calculi. No focal renal lesion. No hydronephrosis. The bladder is unremarkable. Stomach/Bowel: Rectal tube in place. Contrast throughout the colon. The gastrostomy tube is appropriately positioned within the stomach and does not violate the colon. The soft tissue density seen anterior to the stomach along the gastrostomy tube likely reflects postprocedural changes in the omentum. No evidence of enteric contrast extravasation from the colon. Normal appendix. The stomach remains distended with air. No bowel wall thickening, distention, or surrounding inflammatory changes. Vascular/Lymphatic: Aortic atherosclerosis. No enlarged abdominal or pelvic lymph nodes. Reproductive: Uterus and bilateral adnexa are unremarkable. Other: Slight interval decrease in amount of free intraperitoneal air, predominantly within the anterior abdomen and extending into the periumbilical hernia. Trace free fluid in the pelvis. Musculoskeletal: No acute or significant osseous findings. IMPRESSION: 1. Appropriately positioned gastrostomy tube within the stomach. No violation of the colon. No enteric contrast extravasation from the colon. 2. Slight interval decrease in amount of free intraperitoneal air. 3. Unchanged bilateral punctate nonobstructive nephrolithiasis. 4. Cholelithiasis. 5. Unchanged small bilateral pleural effusions with adjacent lower lobe atelectasis. 6.  Aortic atherosclerosis (ICD10-I70.0). Electronically Signed   By: Titus Dubin M.D.   On: 08/14/2017 16:15    Medications:  Scheduled Meds: . albuterol  2.5 mg Nebulization Q4H  . budesonide (PULMICORT) nebulizer solution  0.5 mg Nebulization BID  . chlorhexidine  15 mL Mouth Rinse BID  . feeding supplement (PRO-STAT SUGAR FREE 64)  30 mL Per Tube BID  . free water  100 mL Per Tube Q6H  . liver oil-zinc oxide   Topical Daily  . mouth rinse  15 mL Mouth Rinse QID  . mirtazapine  15 mg Per Tube QHS  . pantoprazole sodium  40 mg Per Tube Daily  . senna-docusate  2 tablet Oral BID  . sodium chloride flush  10-40 mL Intracatheter Q12H  . thyroid  30 mg Per Tube QAC breakfast   Continuous Infusions: . feeding supplement (JEVITY 1.2 CAL) 20 mL/hr at 08/15/17 2000   PRN Meds:.acetaminophen, ALPRAZolam, bisacodyl, diphenhydrAMINE, iopamidol, morphine injection, [DISCONTINUED] ondansetron **OR** ondansetron (ZOFRAN) IV, simethicone, sodium chloride flush   Assessment: PEG tube  Plan: CT was rectal contrast done yesterday, see yesterday's note  Results show "Appropriately positioned gastrostomy tube within the stomach. No violation of the colon. No enteric contrast extravasation from the colon. 2. Slight interval decrease in amount of free intraperitoneal air."  Therefore, no colocutaneous fistula or perforation present.  There are no diaphragms seen on the  x-ray and recent imaging, is thus expected postoperative air from PEG tube placement.  Patient with abdominal pain, and has been tolerating feeds  PEG tube external bumper now left at just below 5-1/2 cm mark, i.e. 1 cm/1 finger width away from abdominal skin, to prevent buried bumper syndrome.  GI service will sign off, please page with any questions    LOS: 32 days   Vonda Antigua, MD 08/15/2017, 9:15 PM

## 2017-08-15 NOTE — Care Management (Addendum)
[place] sn[place] for [place]. Patient still pending SBT in hopes to wean from vent. Per record patient was talking during the night last night and it is notes she was able to stand. It appears her spirits are lifted. MD wishes to delay SNF vent transfer until PEG is placed or it has been determined patient does not need PEG;  however it is very likely this bed will be lost. These beds are limited.  Patient may progress to regular SNF need.  RNCM and CSW will follow. RNCM has updated CSW that patient DOES HAVE A PEG IN PLACE. Message sent to MD. Per MD issue with PEG- Air in stomach over weekend; surgery looked at and said it was air from placement of tub. Patient at goal rate now per dietician. Per MD GI is still concerned about PEG. At 1030AM: RNCM received notification from ICU provider that patient's PEG has been cleared for use and that patient is ready for transfer to SNF/Vent if family agrees. CSW updated and will keep team updated.

## 2017-08-15 NOTE — Clinical Social Work Note (Signed)
Patient's son arrived to the hospital this afternoon and has some questions for CSW. CSW was unable to see him before he left and CSW has left a message for him to call CSW back in an effort to answer his questions. Shela Leff MSW,LCSW [phone]

## 2017-08-15 NOTE — Progress Notes (Signed)
[facility] NAME: Kayla Boyer    MR#:  [id number]  DATE OF BIRTH:  Aug 12, 1955  SUBJECTIVE:  CHIEF COMPLAINT:   Chief Complaint  Patient presents with  . Weakness   -Status post trach and PEG tube. -Alert and communicates by writing. -SBT daily -for vent SNF today or tomorrow  REVIEW OF SYSTEMS:  Review of Systems  Constitutional: Positive for malaise/fatigue. Negative for chills and fever.  HENT: Negative for congestion, ear discharge, hearing loss and nosebleeds.   Respiratory: Positive for shortness of breath. Negative for cough and wheezing.   Cardiovascular: Negative for chest pain, palpitations and leg swelling.  Gastrointestinal: Positive for abdominal pain. Negative for constipation, diarrhea, nausea and vomiting.  Genitourinary: Negative for dysuria.  Neurological: Positive for weakness. Negative for dizziness, seizures and headaches.    DRUG ALLERGIES:  No Known Allergies  VITALS:  Blood pressure (!) 95/54, pulse 95, temperature 98.6 F (37 C), resp. rate 16, height 5' 2.5" (1.588 m), weight 52.7 kg (116 lb 2.9 oz), SpO2 100 %.  PHYSICAL EXAMINATION:  Physical Exam  GENERAL:  62 y.o.-year-old patient lying in the bed with no acute distress.  EYES: Pupils equal, round, reactive to light and accommodation. No scleral icterus. Extraocular muscles intact.  HEENT: Head atraumatic, normocephalic. Oropharynx and nasopharynx clear.  NECK:  Supple, no jugular venous distention. No thyroid enlargement, no tenderness.  Tracheostomy in place and connected to vent LUNGS: Normal breath sounds bilaterally, no wheezing, rales,rhonchi or crepitation. No use of accessory muscles of respiration.  Decreased bibasilar breath sounds. CARDIOVASCULAR: S1, S2 normal. No murmurs, rubs, or gallops.  ABDOMEN: Soft, nontender, nondistended.  PEG tube in place.  Bowel sounds present. No organomegaly or mass.  EXTREMITIES: No pedal edema,  cyanosis, or clubbing.  NEUROLOGIC: Cranial nerves II through XII are intact. Muscle strength 5/5 in all extremities. Sensation intact. Gait not checked.  Global weakness noted. PSYCHIATRIC: The patient is alert and oriented x 3.  SKIN: No obvious rash, lesion, or ulcer.    LABORATORY PANEL:   CBC Recent Labs  Lab 08/14/17 0559  WBC 6.1  HGB 9.2*  HCT 28.5*  PLT 388   ------------------------------------------------------------------------------------------------------------------  Chemistries  Recent Labs  Lab 08/15/17 0425  NA 137  K 3.1*  CL 103  CO2 30  GLUCOSE 119*  BUN 11  CREATININE <0.30*  CALCIUM 8.8*  MG 1.4*   ------------------------------------------------------------------------------------------------------------------  Cardiac Enzymes No results for input(s): TROPONINI in the last 168 hours. ------------------------------------------------------------------------------------------------------------------  RADIOLOGY:  Ct Abdomen Pelvis W Contrast  Result Date: 08/14/2017 CLINICAL DATA:  Gastrostomy tube placement 4 days ago. Concern that gastrostomy placement may have violated the colon. EXAM: CT ABDOMEN AND PELVIS WITH CONTRAST TECHNIQUE: Multidetector CT imaging of the abdomen and pelvis was performed using the standard protocol following bolus administration of intravenous contrast. Water soluble rectal contrast was also administered. CONTRAST:  60mL OMNIPAQUE IOHEXOL 300 MG/ML  SOLN COMPARISON:  CT abdomen pelvis dated August 12, 2017. FINDINGS: Lower chest: Unchanged small bilateral pleural effusions with adjacent lower lobe atelectasis. Hepatobiliary: No focal liver abnormality. Cholelithiasis. No gallbladder wall thickening or biliary dilatation. Pancreas: Unremarkable. No pancreatic ductal dilatation or surrounding inflammatory changes. Spleen: Normal in size without focal abnormality. Adrenals/Urinary Tract: The adrenal glands are unremarkable.  Unchanged punctate bilateral renal calculi. No focal renal lesion. No hydronephrosis. The bladder is unremarkable. Stomach/Bowel: Rectal tube in place. Contrast throughout the colon. The gastrostomy tube is appropriately positioned within the  stomach and does not violate the colon. The soft tissue density seen anterior to the stomach along the gastrostomy tube likely reflects postprocedural changes in the omentum. No evidence of enteric contrast extravasation from the colon. Normal appendix. The stomach remains distended with air. No bowel wall thickening, distention, or surrounding inflammatory changes. Vascular/Lymphatic: Aortic atherosclerosis. No enlarged abdominal or pelvic lymph nodes. Reproductive: Uterus and bilateral adnexa are unremarkable. Other: Slight interval decrease in amount of free intraperitoneal air, predominantly within the anterior abdomen and extending into the periumbilical hernia. Trace free fluid in the pelvis. Musculoskeletal: No acute or significant osseous findings. IMPRESSION: 1. Appropriately positioned gastrostomy tube within the stomach. No violation of the colon. No enteric contrast extravasation from the colon. 2. Slight interval decrease in amount of free intraperitoneal air. 3. Unchanged bilateral punctate nonobstructive nephrolithiasis. 4. Cholelithiasis. 5. Unchanged small bilateral pleural effusions with adjacent lower lobe atelectasis. 6.  Aortic atherosclerosis (ICD10-I70.0). Electronically Signed   By: Titus Dubin M.D.   On: 08/14/2017 16:15    EKG:   Orders placed or performed during the hospital encounter of 07/14/17  . ED EKG  . ED EKG  . EKG 12-Lead  . EKG 12-Lead    ASSESSMENT AND PLAN:   62 year old female with past medical history significant for chronic respiratory failure secondary to COPD and emphysema, known history of gastric ulcer presents to hospital secondary to weakness and melena.  1.  GI bleed- secondary to chronic gastric ulcer without  hemorrhage or perforation. -Appreciate GI consult. -No active bleeding noted.  Hemoglobin is stable.   -Status post EGD on 07/17/2017 showing normal duodenum, nonbleeding gastric ulcer noted.  Continue Protonix.  2.  Acute on chronic respiratory failure-secondary to hypoventilation from muscle weakness and COPD -Initially was on BiPAP, and intubated and currently status post tracheostomy and 40% FiO2. -Continue trach collar trials every day.  3.  Moderate to severe malnutrition-appreciate dietitian consult. -Status post tracheostomy.  Failed swallow study. -PEG tube placed and currently on PEG tube feeds.  4.  Electrolyte imbalances-being managed by pharmacy  5.  Air under diaphragm post PEG tube placement-CT ordered with still intraperitoneal air, but no contrast extravasation.  KUBs as needed -Tolerating tube feeds well.  No signs of peritonitis -Appreciate surgical consult.  -We will need surgery if develops any signs of peritonitis.  6.  Depression-on Remeron  7.  Hypothyroidism-continue thyroid supplements  8.  DVT prophylaxis-currently on teds and SCDs.   encourage to add heparin if hemoglobin is stable   Physical Therapy consult Likely discharge to Vent SNF when bed available   All the records are reviewed and case discussed with Care Management/Social Workerr. Management plans discussed with the patient, family and they are in agreement.  CODE STATUS: Full code  TOTAL TIME TAKING CARE OF THIS PATIENT: 36 minutes.   POSSIBLE D/C IN ? DAYS, DEPENDING ON CLINICAL CONDITION.   Gladstone Lighter M.D on 08/15/2017 at 3:07 PM  Between 7am to 6pm - Pager - [phone]  After 6pm go to [email or web address] - password [email or web address] [facility]  Office  [phone]  CC: Primary care physician; Patient, [name]

## 2017-08-15 NOTE — Discharge Summary (Addendum)
Physician Discharge Summary  Patient ID: Kayla Boyer MRN: [id number] DOB/AGE: 1955/08/05 62 y.o.  Admit date: 07/14/2017 Discharge date: .08/17/2017   Admission Diagnoses: resp failure  Discharge Diagnoses:  Principal Problem:   Major depressive disorder, single episode, severe without psychotic features ([place]) Active Problems:   GI bleed   Chronic gastric ulcer without hemorrhage and without perforation   Acute respiratory failure (HCC)   Goals of care, counseling/discussion   Palliative care encounter   Acute on chronic respiratory failure with hypercapnia ([place])   Protein-calorie malnutrition ([place])   At high risk for aspiration   Palliative care by specialist   Dysphagia   Discharged Condition: Stable   Hospital Course:  Admitted for GIB 3/15 03/15-Pt admitted to stepdown unit on with acute blood loss anemia, acute encephalopathy, and acute on chronic hypoxic hypercapnic respiratory failure on Bipap  03/17-Pt transferred to [facility] unit 03/18-Pt underwent EGD results revealed normal duodenal bulb and second portion of the  duodenum, duodenal lipoma, non-bleeding gastric ulcer with a clean ulcer base, and normal gastroesophageal junction and esophagus. Rapid response called pt developed acute encephalopathy secondary to acute on chronic hypercapnic respiratory failure likely secondary to sedating medications received during EGD requiring Bipap.   Intubated for severe resp failure-failed ot wean from vent Therapy for COPD exacerbation and pneumonia  3/28 Trach placed  Failure to wean from vent Patient also obtained PEG tube which is intact   TREATMENTS INCLUDE IV ABX, IV STEROIDS BD THERAPY,VENT THERAPY  Allergies as of 08/17/2017   No Known Allergies     Medication List    STOP taking these medications   feeding supplement (ENSURE ENLIVE) Liqd   ferrous sulfate 325 (65 FE) MG tablet   pantoprazole 40 MG tablet Commonly known as:  PROTONIX   vitamin B-12  1000 MCG tablet Commonly known as:  CYANOCOBALAMIN   vitamin C 500 MG tablet Commonly known as:  ASCORBIC ACID     TAKE these medications   albuterol (2.5 MG/3ML) 0.083% nebulizer solution Commonly known as:  PROVENTIL Take 3 mLs (2.5 mg total) by nebulization every 4 (four) hours.   bisacodyl 10 MG suppository Commonly known as:  DULCOLAX Place 1 suppository (10 mg total) rectally daily as needed for moderate constipation.   chlorhexidine 0.12 % solution Commonly known as:  PERIDEX 15 mLs by Mouth Rinse route 2 (two) times daily.   feeding supplement (PRO-STAT SUGAR FREE 64) Liqd Place 30 mLs into feeding tube 2 (two) times daily.   liver oil-zinc oxide 40 % ointment Commonly known as:  DESITIN Apply topically daily. Start taking on:  08/18/2017   ondansetron 4 MG/2ML Soln injection Commonly known as:  ZOFRAN Inject 2 mLs (4 mg total) into the vein every 6 (six) hours as needed for nausea.   simethicone 80 MG chewable tablet Commonly known as:  MYLICON Chew 1 tablet (80 mg total) by mouth every 6 (six) hours as needed for flatulence.      DISCHARGE VENT SETTINGS  PS 15, PEEP 5 fio2 35%  Discharge Exam: Blood pressure 96/62, pulse 89, temperature 98.7 F (37.1 C), resp. rate 18, height 5' 2.5" (1.588 m), weight 116 lb 2.9 oz (52.7 kg), SpO2 100 %.  Disposition:   VENT/SNF    Signed: Flora Lipps 08/15/2017, 1:38 PM

## 2017-08-15 NOTE — Progress Notes (Signed)
   08/15/17 1300  Clinical Encounter Type  Visited With Patient  Visit Type Follow-up   Chaplain checked in with patient, no needs at present.

## 2017-08-15 NOTE — Clinical Social Work Note (Signed)
CSW informed this morning that even though a vent/snf bed has been obtained and [place] could have accepted this week, patient is not stable for discharge as she has not had her PEG placed. MD's are trying to continue to wean her and patient is progressing from a mobility standpoint. Admission's coordinator for [facility] SN[facility] will be off next week so if patient is to transfer to vent/snf still, then it will not be until the week of April 29th.  CSW will speak with Sonia Baller at [facility] to see if they can hold the bed. Vent/snf beds are very difficult to come by so they may not be able to hold it. Shela Leff MSW,LCSW

## 2017-08-16 LAB — BASIC METABOLIC PANEL
ANION GAP: 5 (ref 5–15)
BUN: 12 mg/dL (ref 6–20)
CALCIUM: 9.2 mg/dL (ref 8.9–10.3)
CO2: 32 mmol/L (ref 22–32)
Chloride: 101 mmol/L (ref 101–111)
GLUCOSE: 103 mg/dL — AB (ref 65–99)
Potassium: 4.5 mmol/L (ref 3.5–5.1)
Sodium: 138 mmol/L (ref 135–145)

## 2017-08-16 LAB — GLUCOSE, CAPILLARY
GLUCOSE-CAPILLARY: 107 mg/dL — AB (ref 65–99)
GLUCOSE-CAPILLARY: 90 mg/dL (ref 65–99)
GLUCOSE-CAPILLARY: 95 mg/dL (ref 65–99)

## 2017-08-16 LAB — MAGNESIUM: Magnesium: 2 mg/dL (ref 1.7–2.4)

## 2017-08-16 LAB — PHOSPHORUS: Phosphorus: 4.7 mg/dL — ABNORMAL HIGH (ref 2.5–4.6)

## 2017-08-16 LAB — MRSA PCR SCREENING

## 2017-08-16 MED ORDER — JEVITY 1.2 CAL PO LIQD
50.0000 mL/h | ORAL | Status: DC
  Administered 2017-08-16 – 2017-08-17 (×2): 50 mL/h

## 2017-08-16 MED ORDER — OCUVITE-LUTEIN PO CAPS
1.0000 | ORAL_CAPSULE | Freq: Every day | ORAL | Status: DC
  Administered 2017-08-16 – 2017-08-17 (×2): 1
  Filled 2017-08-16 (×2): qty 1

## 2017-08-16 NOTE — Progress Notes (Signed)
Nutrition Follow-up  DOCUMENTATION CODES:   Not applicable  INTERVENTION:  Continue Jevity 1.2 at 50 mL/hr (1200 mL goal daily volume) + Pro-Stat 30 mL BID via PEG tube. Provides 1640 kcal, 97 grams of protein, 22 grams of fiber, 972 mL H2O daily.  Continue free water flush of 100 mL Q6hrs. Provides 1372 mL H2O daily including water in tube feeds.  Ocuvite daily per tube for wound healing (provides zinc, vitamin A, vitamin C, Vitamin E, copper, and selenium).  NUTRITION DIAGNOSIS:   Increased nutrient needs related to catabolic illness(acute exacerbation of COPD) as evidenced by estimated needs.  Ongoing.  GOAL:   Patient will meet greater than or equal to 90% of their needs  Met with TF regimen.  MONITOR:   PO intake, Labs, Weight trends, TF tolerance, I & O's  REASON FOR ASSESSMENT:   Malnutrition Screening Tool, Consult Enteral/tube feeding initiation and management  ASSESSMENT:   62 year old female with PMHx of HLD, GI bleed, emphysema of lung, COPD who was admitted 3/15 with acute blood loss anemia, acute encephalopathy, and acute on chronic hypercapnic respiratory failure secondary to acute exacerbation of COPD requiring BiPAP. On 3/17 she was able to transfer to [facility] unit. She underwent EGD on 3/18 and a rapid response was called due to respiratory failure likely due to sedating medications once again requiring BiPAP.   -Patient s/p tracheostomy tube placement on 3/28. -PEG tube was placed by GI on 4/11. -There was concern for colocutaneous fistula vs perforation, but CT with rectal contrast was completed on 4/15 which found no violation of colon or enteric contrast extravasation from colon. Likely postoperative air from PEG tube. -Discharge planning for SNF that can take ventilated patients.  Remains on ventilator with trach. Patient continues to tolerate tube feeds at goal rate via PEG tube. She denies any N/V or abdominal pain. Abdomen soft.   Access: 20 Fr.  EndoVive Safety gastrostomy tube placed 08/10/2017; placement confirmed to be in stomach by re-look endoscopy  TF: pt tolerating Jevity 1.2 at 50 mL/hr + Pro-Stat 30 mL BID  Patient is currently intubated on ventilator support MV: 7.6 L/min Temp (24hrs), Avg:98.5 F (36.9 C), Min:98.1 F (36.7 C), Max:98.7 F (37.1 C)  Propofol: N/A  Medications reviewed and include: free water flush 100 mL Q6hrs, mirtazapine, pantoprazole, senna-docusate, Armour.  Labs reviewed: CBG 97-120.  I/O: 2000 mL UOP yesterday (1.6 mL/kg/hr) + 2 unmeasured occurrences of UOP  No subsequent weight to trend since last assessment.  Discussed with RN and on rounds.  Diet Order:  Diet NPO time specified  EDUCATION NEEDS:   Not appropriate for education at this time  Skin:  Skin Assessment: Skin Integrity Issues: Skin Integrity Issues:: Stage II, Incisions, Other (Comment) Stage II: coccyx Incisions: closed incision to neck Other: MSAD to sacrum; scattered ecchymosis  Last BM:  08/14/2017 - large type 6  Height:   Ht Readings from Last 1 Encounters:  07/14/17 5' 2.5" (1.588 m)    Weight:   Wt Readings from Last 1 Encounters:  08/09/17 116 lb 2.9 oz (52.7 kg)    Ideal Body Weight:  51.1 kg  BMI:  Body mass index is 20.91 kg/m.  Estimated Nutritional Needs:   Kcal:  1320-1600 (25-30 kcal/kg)  Protein:  80-95 grams (1.5-1.8 grams/kg)  Fluid:  1.3-1.6 L/day (25-30 mL/kg)  Willey Blade, MS, RD, LDN Office: [phone] Pager: [phone] After Hours/Weekend Pager: [phone]

## 2017-08-16 NOTE — Clinical Social Work Note (Signed)
Patient's son never returned Jenny's call [phone]) from [facility]. CSW attempted to reach patient's son again this morning regarding questions he had late yesterday. Patient's son wanted to know if there were any facilities closer than [place]. CSW explained again to patient's son how difficult and rare vent/snf facilities are to get into and that patient was actually fortunate to get an opening so soon. CSW provided empathetic listening and supportive counseling. This is understandably a very difficult decision and process for patient and the family as patient is progressing everyday. Shela Leff MSW,LCSW [phone]

## 2017-08-16 NOTE — Clinical Social Work Note (Signed)
CSW spoke with Sonia Baller at [facility] and she stated that patient's son eventually called her back and she informed him she needed all of the information that he provided for our medicaid so that she could get patient's application started for [place]. At this time patient's son has not provided this information to her. Shela Leff MSW,LCSW [phone]

## 2017-08-17 LAB — GLUCOSE, CAPILLARY
Glucose-Capillary: 114 mg/dL — ABNORMAL HIGH (ref 65–99)
Glucose-Capillary: 105 mg/dL — ABNORMAL HIGH (ref 65–99)

## 2017-08-17 MED ORDER — SIMETHICONE 80 MG PO CHEW: 80.0000 mg | CHEWABLE_TABLET | Freq: Four times a day (QID) | ORAL | 0 refills | Status: AC | PRN

## 2017-08-17 MED ORDER — LORAZEPAM 2 MG/ML IJ SOLN
2.0000 mg | Freq: Once | INTRAMUSCULAR | Status: AC
  Administered 2017-08-17: 2 mg via INTRAVENOUS

## 2017-08-17 MED ORDER — PRO-STAT SUGAR FREE PO LIQD: 30.0000 mL | Freq: Two times a day (BID) | ORAL | 0 refills | Status: AC

## 2017-08-17 MED ORDER — ONDANSETRON HCL 4 MG/2ML IJ SOLN: 4.0000 mg | Freq: Four times a day (QID) | INTRAMUSCULAR | 0 refills | Status: AC | PRN

## 2017-08-17 MED ORDER — BISACODYL 10 MG RE SUPP: 10.0000 mg | Freq: Every day | RECTAL | 0 refills | Status: AC | PRN

## 2017-08-17 MED ORDER — LORAZEPAM 2 MG/ML IJ SOLN
INTRAMUSCULAR | Status: AC
  Administered 2017-08-17: 2 mg via INTRAVENOUS
  Filled 2017-08-17: qty 1

## 2017-08-17 MED ORDER — ZINC OXIDE 40 % EX OINT: TOPICAL_OINTMENT | Freq: Every day | CUTANEOUS | 0 refills | Status: AC

## 2017-08-17 MED ORDER — ALBUTEROL SULFATE (2.5 MG/3ML) 0.083% IN NEBU: 2.5000 mg | INHALATION_SOLUTION | RESPIRATORY_TRACT | 12 refills | Status: AC

## 2017-08-17 MED ORDER — CHLORHEXIDINE GLUCONATE 0.12 % MT SOLN: 15.0000 mL | Freq: Two times a day (BID) | OROMUCOSAL | 0 refills | Status: AC

## 2017-08-17 NOTE — Progress Notes (Signed)
Pt being discharged to [facility]. [facility] transportation is transporting pt viz ambulance. Report given to EMS crew and report called to Anne Ng at [facility]. Discharge paperwork provided for EMS and a copy of discharge summary faxed to facility. Pt given 2 mg ativan prior to departure due to anxiety. Pt comfortable at the time of departure and released to EMS crew without incident.

## 2017-08-17 NOTE — Clinical Social Work Note (Signed)
CSW contacted patient's son and he assures me that he will fax the necessary information to [place] with [place] and Rehab. CSW has left message for Sonia Baller at [facility]. Shela Leff MSW,LCSW [phone]

## 2017-08-17 NOTE — Progress Notes (Signed)
[place] [facility] [place] NAME: Kayla Boyer    MR#:  [id number]  DATE OF BIRTH:  08-Feb-1956  SUBJECTIVE:  CHIEF COMPLAINT:   Chief Complaint  Patient presents with  . Weakness  -No overnight events resting comfortably -Status post trach and PEG tube. -Alert and communicates by writing. -SBT daily -for SNF today at [place]:  Review of Systems  Constitutional: Positive for malaise/fatigue. Negative for chills and fever.  HENT: Negative for congestion, ear discharge, hearing loss and nosebleeds.   Respiratory: Positive for shortness of breath. Negative for cough and wheezing.   Cardiovascular: Negative for chest pain, palpitations and leg swelling.  Gastrointestinal: Positive for abdominal pain. Negative for constipation, diarrhea, nausea and vomiting.  Genitourinary: Negative for dysuria.  Neurological: Positive for weakness. Negative for dizziness, seizures and headaches.    DRUG ALLERGIES:  No Known Allergies  VITALS:  Blood pressure 93/69, pulse (!) 101, temperature 98.6 F (37 C), temperature source Oral, resp. rate 20, height 5' 2.5" (1.588 m), weight 52.7 kg (116 lb 2.9 oz), SpO2 100 %.  PHYSICAL EXAMINATION:  Physical Exam  GENERAL:  62 y.o.-year-old patient lying in the bed with no acute distress.  EYES: Pupils equal, round, reactive to light and accommodation. No scleral icterus. Extraocular muscles intact.  HEENT: Head atraumatic, normocephalic. Oropharynx and nasopharynx clear.  NECK:  Supple, no jugular venous distention. No thyroid enlargement, no tenderness.  Tracheostomy in place and connected to vent LUNGS: Normal breath sounds bilaterally, no wheezing, rales,rhonchi or crepitation. No use of accessory muscles of respiration.  Decreased bibasilar breath sounds. CARDIOVASCULAR: S1, S2 normal. No murmurs, rubs, or gallops.  ABDOMEN: Soft, nontender, nondistended.  PEG tube in place.  Bowel sounds present.  No organomegaly or mass.  EXTREMITIES: No pedal edema, cyanosis, or clubbing.  NEUROLOGIC: Cranial nerves II through XII are intact. Muscle strength 5/5 in all extremities. Sensation intact. Gait not checked.  Global weakness noted. PSYCHIATRIC: The patient is alert and oriented x 3.  SKIN: No obvious rash, lesion, or ulcer.    LABORATORY PANEL:   CBC Recent Labs  Lab 08/14/17 0559  WBC 6.1  HGB 9.2*  HCT 28.5*  PLT 388   ------------------------------------------------------------------------------------------------------------------  Chemistries  Recent Labs  Lab 08/16/17 0340  NA 138  K 4.5  CL 101  CO2 32  GLUCOSE 103*  BUN 12  CREATININE <0.30*  CALCIUM 9.2  MG 2.0   ------------------------------------------------------------------------------------------------------------------  Cardiac Enzymes No results for input(s): TROPONINI in the last 168 hours. ------------------------------------------------------------------------------------------------------------------  RADIOLOGY:  No results found.  EKG:   Orders placed or performed during the hospital encounter of 07/14/17  . ED EKG  . ED EKG  . EKG 12-Lead  . EKG 12-Lead    ASSESSMENT AND PLAN:   62 year old female with past medical history significant for chronic respiratory failure secondary to COPD and emphysema, known history of gastric ulcer presents to hospital secondary to weakness and melena.  1.  GI bleed- secondary to chronic gastric ulcer without hemorrhage or perforation. -Appreciate GI consult. -No active bleeding noted.  Hemoglobin is stable.   -Status post EGD on 07/17/2017 showing normal duodenum, nonbleeding gastric ulcer noted.  Continue Protonix.  2.  Acute on chronic respiratory failure-secondary to hypoventilation from muscle weakness and COPD -Initially was on BiPAP, and intubated and currently status post tracheostomy and 40% FiO2. -Continue trach collar trials every day. Mgmt  per PCCM  3.  Moderate to severe  malnutrition-appreciate dietitian consult. -Status post tracheostomy.  Failed swallow study. -PEG tube placed and currently on PEG tube feeds.  4.  Electrolyte imbalances-being managed by pharmacy  5.  Air under diaphragm post PEG tube placement-CT ordered with still intraperitoneal air, but no contrast extravasation.  KUBs as needed -Tolerating tube feeds well.  No signs of peritonitis -Appreciate surgical consult.   6.  Depression-on Remeron  7.  Hypothyroidism-continue thyroid supplements  8.  DVT prophylaxis-currently on teds and SCDs.   encourage to add heparin if hemoglobin is stable   Physical Therapy consult-SNF  discharge to Vent SNF today for blood work is done by the son   All the records are reviewed and case discussed with Care Management/Social Workerr. Management plans discussed with the patient, family and they are in agreement.  CODE STATUS: Full code  TOTAL TIME TAKING CARE OF THIS PATIENT: 16 minutes.   POSSIBLE D/C IN ? DAYS, DEPENDING ON CLINICAL CONDITION.   Nicholes Mango M.D on 08/17/2017 at 1:48 PM  Between 7am to 6pm - Pager - [phone]  After 6pm go to [email or web address] - password [email or web address] [facility]  Office  [phone]  CC: Primary care physician; Patient, [name]

## 2017-08-17 NOTE — Progress Notes (Signed)
Pharmacy Electrolyte Monitoring Consult:  Pharmacy consulted to assist in monitoring and replacing electrolytes in this 62 y.o. female admitted on 07/14/2017 with Weakness  Patient is currently intubated and not requiring sedation. Patient is receiving Jevity 1.2 @ 3mL/hr for nutrition.   Labs:  Sodium (mmol/L)  Date Value  08/16/2017 138   Potassium (mmol/L)  Date Value  08/16/2017 4.5   Magnesium (mg/dL)  Date Value  08/16/2017 2.0   Phosphorus (mg/dL)  Date Value  08/16/2017 4.7 (H)   Calcium (mg/dL)  Date Value  08/16/2017 9.2   Albumin (g/dL)  Date Value  07/26/2017 2.5 (L)    Plan: Will recheck electrolytes with am labs on 4/19.   Pharmacy will continue to monitor and adjust per consult.   Simpson,Michael L 08/17/2017 12:57 PM

## 2017-08-17 NOTE — Progress Notes (Signed)
Pharmacy Electrolyte Monitoring Consult:  Pharmacy consulted to assist in monitoring and replacing electrolytes in this 62 y.o. female admitted on 07/14/2017 with Weakness  Patient is currently intubated and not requiring sedation. Patient is receiving Jevity 1.2 @ 18mL/hr for nutrition via PEG tube.   Labs:  Sodium (mmol/L)  Date Value  08/16/2017 138   Potassium (mmol/L)  Date Value  08/16/2017 4.5   Magnesium (mg/dL)  Date Value  08/16/2017 2.0   Phosphorus (mg/dL)  Date Value  08/16/2017 4.7 (H)   Calcium (mg/dL)  Date Value  08/16/2017 9.2   Albumin (g/dL)  Date Value  07/26/2017 2.5 (L)    Plan: Will recheck electrolytes with am labs on 4/19.   Pharmacy will continue to monitor and adjust per consult.   Sharad Vaneaton L 08/17/2017 1:13 PM

## 2017-08-17 NOTE — Progress Notes (Signed)
PT Cancellation Note  Patient Details Name: Kayla Boyer MRN: [id number] DOB: 1955-08-07   Cancelled Treatment:    Reason Eval/Treat Not Completed: Other (comment). Treatment attempted, however per discussion with RN, pt currently discharging to SNF. Will hold treatment at this time.   Monia Timmers 08/17/2017, 1:57 PM Greggory Stallion, PT, DPT [phone]

## 2017-08-17 NOTE — Clinical Social Work Placement (Signed)
   CLINICAL SOCIAL WORK PLACEMENT  NOTE  Date:  08/17/2017  Patient Details  Name: Kayla Boyer MRN: [id number] Date of Birth: 10-02-1955  Clinical Social Work is seeking post-discharge placement for this patient at the [place] level of care (*CSW will initial, date and re-position this form in  chart as items are completed):  (NA)   Patient/family provided with [place] Work Department's list of facilities offering this level of care within the geographic area requested by the patient (or if unable, by the patient's family).  (Had only one bed offer for patient )   Patient/family informed of their freedom to choose among providers that offer the needed level of care, that participate in Medicare, Medicaid or managed care program needed by the patient, have an available bed and are willing to accept the patient.  (NA)   Patient/family informed of [facility]'s ownership interest in [facility] and [facility], as well as of the fact that they are under no obligation to receive care at these facilities.  PASRR submitted to EDS on       PASRR number received on       Existing PASRR number confirmed on       FL2 transmitted to all facilities in geographic area requested by pt/family on       FL2 transmitted to all facilities within larger geographic area on       Patient informed that his/her managed care company has contracts with or will negotiate with certain facilities, including the following:        Yes   Patient/family informed of bed offers received.  Patient chooses bed at (Only bed offer was agreed on by the son and patient)     Physician recommends and patient chooses bed at (Vent/SNF)    Patient to be transferred to [facility]) on 08/17/17.  Patient to be transferred to facility by [facility])     Patient family notified on 08/17/17 of transfer.  Name of family member notified:  (patient was alert and  oriented X4 and was wanting to discharge today, patient's son [name] was also aware)     PHYSICIAN       Additional Comment:    _______________________________________________ Shela Leff, LCSW 08/17/2017, 9:52 PM

## 2017-08-17 NOTE — Clinical Social Work Note (Signed)
Sonia Baller from [facility] received all of the necessary documentation from the son by early afternoon. CSW coordinated with Sonia Baller and [facility] transportation in order to get patient transferred to [facility]. Patient's son was aware earlier in the day from our phone conversation that if all paperwork was received that patient would be discharged this afternoon.  Shela Leff MSW,LCSW [phone]

## 2017-08-17 NOTE — Progress Notes (Signed)
  [place] [facility] Progess Note  SYNOPSIS 62 yo AAF admitted for UGIB complicated by severe COPD exacerbation and unable to wean from vent  S/p Trach and PEG tube   CC prolonged ventilator dependence, tracheostomy status  HPI Remains cognitively intact Follows commands briskly Good strength in her extremities.  04/13 Patient desaturated when placed on SBT this AM; CXR done which showed large amount of air below diaphragm out of proportion from expected from G-Tube placement.  Hence CT abdomen and pelvis done which showed G-Tube in good position without perforation.  04/14 Patient has been seen by the surgical team who also reviewed her films and saw no perforation.  She tolerated SBT today.  4/17 on PS mode 15 Will wean slowly, working with PT  VENTILATOR SETTINGS: Vent Mode: PSV FiO2 (%):  [40 %] 40 % PEEP:  [5 cmH20] 5 cmH20 Pressure Support:  [15 cmH20] 15 cmH20  INTAKE / OUTPUT:  Intake/Output Summary (Last 24 hours) at 08/17/2017 0940 Last data filed at 08/17/2017 0345 Gross per 24 hour  Intake 524.33 ml  Output 1625 ml  Net -1100.67 ml    Name: Kayla Boyer MRN: [id number] DOB: 14-Dec-1955    ADMISSION DATE:  07/14/2017   VITAL SIGNS: Temp:  [97.9 F (36.6 C)-98.6 F (37 C)] 97.9 F (36.6 C) (04/17 2356) Pulse Rate:  [78-114] 89 (04/18 0749) Resp:  [11-22] 17 (04/18 0749) BP: (82-118)/(49-83) 84/50 (04/18 0600) SpO2:  [90 %-100 %] 100 % (04/18 0749) FiO2 (%):  [40 %] 40 % (04/18 0728)  PHYSICAL EXAMINATION: Physical Examination:   VS: BP (!) 84/50   Pulse 89   Temp 97.9 F (36.6 C) (Axillary)   Resp 17   Ht 5' 2.5" (1.588 m)   Wt 116 lb 2.9 oz (52.7 kg)   SpO2 100%   BMI 20.91 kg/m   Gen: Awake, alert and appropriate HEENT: [place], sclerae white Neck: Tracheostomy site clean Lungs: Improved breath sounds on the right, few right basilar crackles, no wheezes Cardiovascular: Regular, no M Abdomen: Soft, NT, NABS, peg site  clean Ext: 1+ oedema Neuro: No focal deficits, global weakness. L>R     LABORATORY PANEL:   CBC Recent Labs  Lab 08/14/17 0559  WBC 6.1  HGB 9.2*  HCT 28.5*  PLT 388    Chemistries  Recent Labs  Lab 08/16/17 0340  NA 138  K 4.5  CL 101  CO2 32  GLUCOSE 103*  BUN 12  CREATININE <0.30*  CALCIUM 9.2  MG 2.0  PHOS 4.7*    Recent Labs  Lab 08/15/17 1556 08/15/17 2330 08/16/17 1203 08/16/17 1750 08/16/17 2342 08/17/17 0541  GLUCAP 97 106* 95 90 107* 114*     ASSESSMENT/PLAN  Prolonged VDRF - SBT as tolerated  Chronic hypercarbic respiratory failure RLL collapse, resolved Tracheostomy Atelectasis PEG in situ  Benign intraperitoneal air post peg tube placement- lactic acid negative, wbc normal, pt non toxic, seen by surgeon- no perforation seen so TF restarted Recent GIB - resolved Deconditioning  Cont vent support - aggressive vent trials Continue to wean in PSV as tolerated Continue airway hygiene with chest percussion, nebulized  Continue PPI therapy Continue nutritional support-  TF restarted Continue PT/OT Will monitor intraperitoneal air with KUB /CXR intermittently Working on placement.  Will need vent SNF   Corrin Parker, M.D.  [facility]  Medical Director [place] Director [facility] Cardio-Pulmonary Department

## 2017-08-17 NOTE — Progress Notes (Signed)
OT Cancellation Note  Patient Details Name: Presli Fanguy MRN: [id number] DOB: 03-08-1956   Cancelled Treatment:    Reason Eval/Treat Not Completed: Other (comment). Per chart review and PT, pt discharging to SNF in [place] today, unavailable for OT treatment.   Jeni Salles, MPH, MS, OTR/L ascom [phone] 08/17/17, 2:36 PM

## 2017-08-17 NOTE — Progress Notes (Signed)
[facility] [place] NAME: Kayla Boyer    MR#:  [id number]  DATE OF BIRTH:  [birth year]  SUBJECTIVE:  CHIEF COMPLAINT:   Chief Complaint  Patient presents with  . Weakness   -Status post trach and PEG tube. -Alert and communicates by writing. -SBT daily -for vent SNF today or tomorrow  REVIEW OF SYSTEMS:  Review of Systems  Constitutional: Positive for malaise/fatigue. Negative for chills and fever.  HENT: Negative for congestion, ear discharge, hearing loss and nosebleeds.   Respiratory: Positive for shortness of breath. Negative for cough and wheezing.   Cardiovascular: Negative for chest pain, palpitations and leg swelling.  Gastrointestinal: Positive for abdominal pain. Negative for constipation, diarrhea, nausea and vomiting.  Genitourinary: Negative for dysuria.  Neurological: Positive for weakness. Negative for dizziness, seizures and headaches.    DRUG ALLERGIES:  No Known Allergies  VITALS:  Blood pressure 118/67, pulse 91, temperature 97.9 F (36.6 C), temperature source Axillary, resp. rate 15, height 5' 2.5" (1.588 m), weight 52.7 kg (116 lb 2.9 oz), SpO2 100 %.  PHYSICAL EXAMINATION:  Physical Exam  GENERAL:  62 y.o.-year-old patient lying in the bed with no acute distress.  EYES: Pupils equal, round, reactive to light and accommodation. No scleral icterus. Extraocular muscles intact.  HEENT: Head atraumatic, normocephalic. Oropharynx and nasopharynx clear.  NECK:  Supple, no jugular venous distention. No thyroid enlargement, no tenderness.  Tracheostomy in place and connected to vent LUNGS: Normal breath sounds bilaterally, no wheezing, rales,rhonchi or crepitation. No use of accessory muscles of respiration.  Decreased bibasilar breath sounds. CARDIOVASCULAR: S1, S2 normal. No murmurs, rubs, or gallops.  ABDOMEN: Soft, nontender, nondistended.  PEG tube in place.  Bowel sounds present. No organomegaly or mass.    EXTREMITIES: No pedal edema, cyanosis, or clubbing.  NEUROLOGIC: Cranial nerves II through XII are intact. Muscle strength 5/5 in all extremities. Sensation intact. Gait not checked.  Global weakness noted. PSYCHIATRIC: The patient is alert and oriented x 3.  SKIN: No obvious rash, lesion, or ulcer.    LABORATORY PANEL:   CBC Recent Labs  Lab 08/14/17 0559  WBC 6.1  HGB 9.2*  HCT 28.5*  PLT 388   ------------------------------------------------------------------------------------------------------------------  Chemistries  Recent Labs  Lab 08/16/17 0340  NA 138  K 4.5  CL 101  CO2 32  GLUCOSE 103*  BUN 12  CREATININE <0.30*  CALCIUM 9.2  MG 2.0   ------------------------------------------------------------------------------------------------------------------  Cardiac Enzymes No results for input(s): TROPONINI in the last 168 hours. ------------------------------------------------------------------------------------------------------------------  RADIOLOGY:  No results found.  EKG:   Orders placed or performed during the hospital encounter of 07/14/17  . ED EKG  . ED EKG  . EKG 12-Lead  . EKG 12-Lead    ASSESSMENT AND PLAN:   62 year old female with past medical history significant for chronic respiratory failure secondary to COPD and emphysema, known history of gastric ulcer presents to hospital secondary to weakness and melena.  1.  GI bleed- secondary to chronic gastric ulcer without hemorrhage or perforation. -Appreciate GI consult. -No active bleeding noted.  Hemoglobin is stable.   -Status post EGD on 07/17/2017 showing normal duodenum, nonbleeding gastric ulcer noted.  Continue Protonix.  2.  Acute on chronic respiratory failure-secondary to hypoventilation from muscle weakness and COPD -Initially was on BiPAP, and intubated and currently status post tracheostomy and 40% FiO2. -Continue trach collar trials every day. Mgmt per PCCM  3.  Moderate  to severe malnutrition-appreciate dietitian consult. -  Status post tracheostomy.  Failed swallow study. -PEG tube placed and currently on PEG tube feeds.  4.  Electrolyte imbalances-being managed by pharmacy  5.  Air under diaphragm post PEG tube placement-CT ordered with still intraperitoneal air, but no contrast extravasation.  KUBs as needed -Tolerating tube feeds well.  No signs of peritonitis -Appreciate surgical consult.  -We will need surgery if develops any signs of peritonitis.  6.  Depression-on Remeron  7.  Hypothyroidism-continue thyroid supplements  8.  DVT prophylaxis-currently on teds and SCDs.   encourage to add heparin if hemoglobin is stable   Physical Therapy consult Likely discharge to [facility] when bed available. CSW working on it   All the records are reviewed and case discussed with Care Management/Social Workerr. Management plans discussed with the patient, family and they are in agreement.  CODE STATUS: Full code  TOTAL TIME TAKING CARE OF THIS PATIENT: 16 minutes.   POSSIBLE D/C IN ? DAYS, DEPENDING ON CLINICAL CONDITION.   Max Sane M.D on 08/17/2017 at 12:04 AM  Between 7am to 6pm - Pager - [phone]  After 6pm go to [email or web address] - password [email or web address] [facility]  Office  [phone]  CC: Primary care physician; Patient, [name]

## 2017-08-21 ENCOUNTER — Encounter: Payer: Self-pay | Admitting: Gastroenterology

## 2017-08-21 ENCOUNTER — Ambulatory Visit: Payer: Self-pay | Admitting: Gastroenterology

## 2017-08-30 DIAGNOSIS — Z931 Gastrostomy status: Secondary | ICD-10-CM

## 2018-03-02 DEATH — deceased

## 2019-10-26 IMAGING — CT CT RENAL STONE PROTOCOL
2 of 4 series · 15 of 46 positions shown, 17 images · non-contrast
Comparison: 06/05/2017 CT.

CLINICAL DATA: 61-year-old female with blood on pad. Question
rectal bleeding, vaginal bleeding or blood in urine. Initial
encounter.

EXAM:
CT ABDOMEN AND PELVIS WITHOUT CONTRAST
TECHNIQUE: Multidetector CT imaging of the abdomen and pelvis was performed
following the standard protocol without IV contrast.

[Series 2: stone full standard · axial · 0.87mm/px · z∈[-416,+9]mm · 12 of 95 slices shown, 14 images]
[im 5/95  soft-tissue]
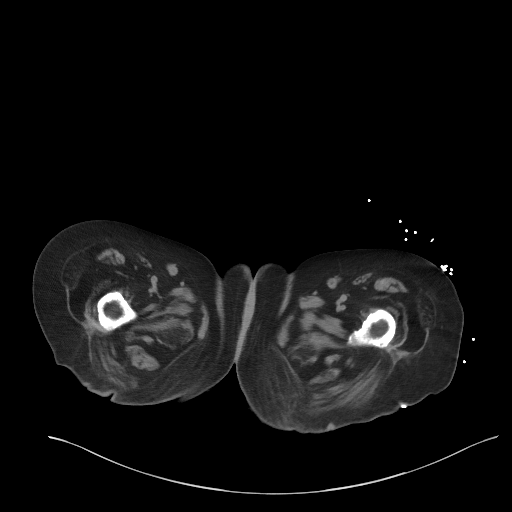
[im 5/95  bone]
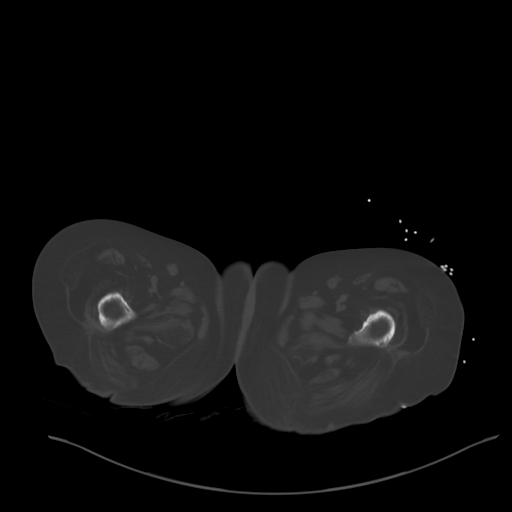
[im 15/95  soft-tissue]
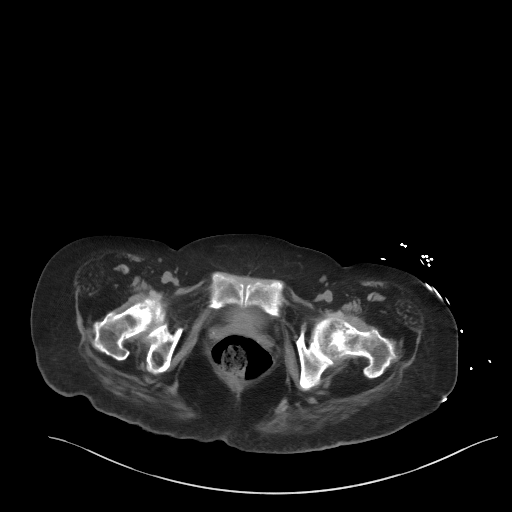
[im 20/95  soft-tissue]
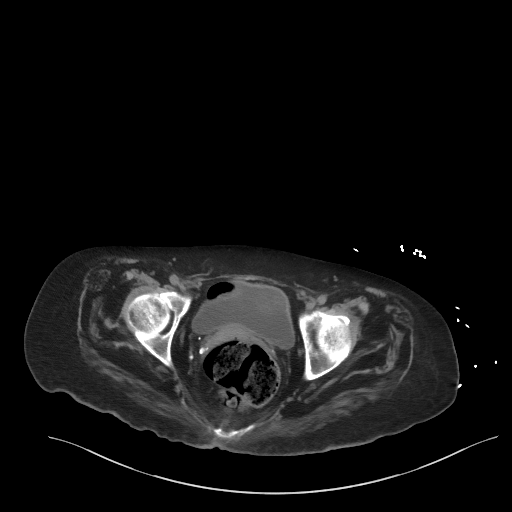
[im 30/95  soft-tissue]
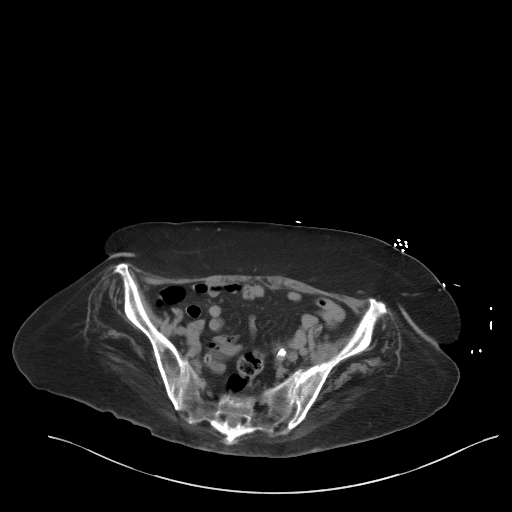
[im 35/95  soft-tissue]
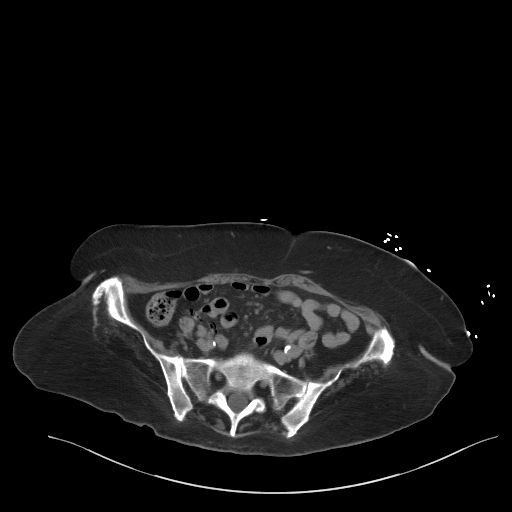
[im 45/95  soft-tissue]
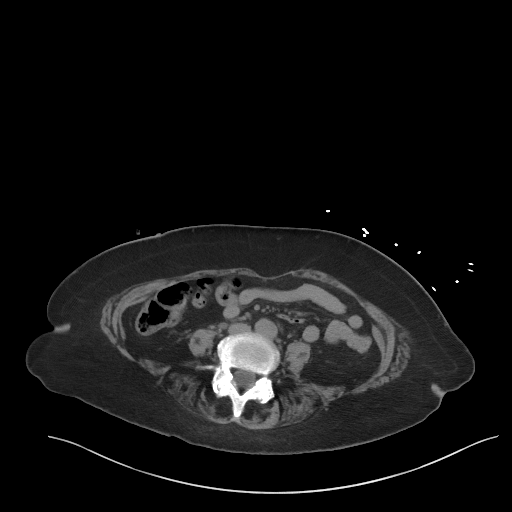
[im 50/95  soft-tissue]
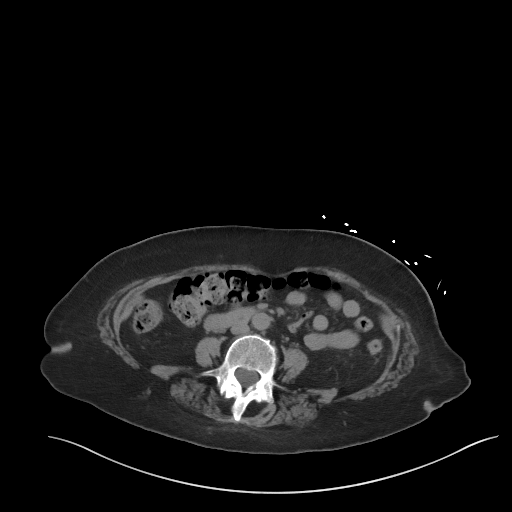
[im 60/95  soft-tissue]
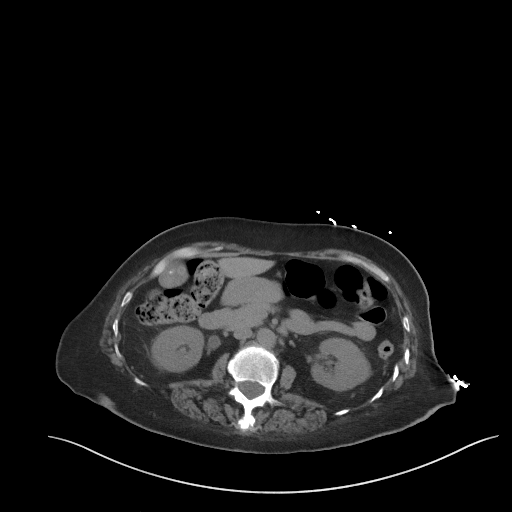
[im 65/95  soft-tissue]
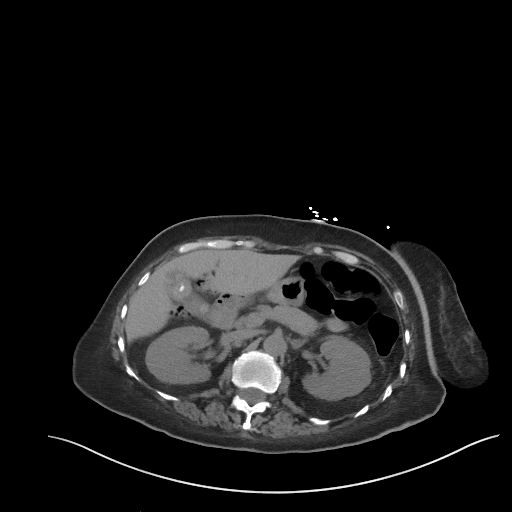
[im 65/95  bone]
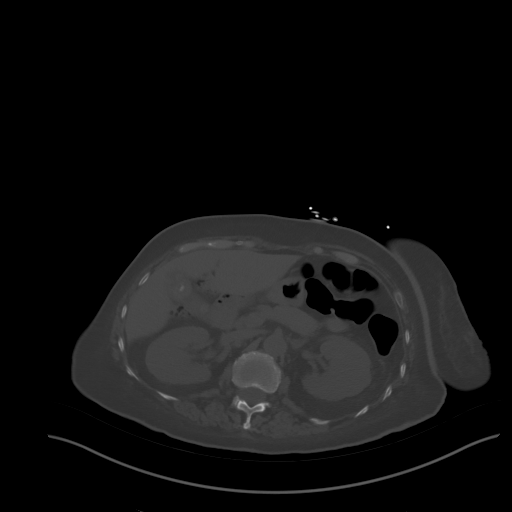
[im 75/95  soft-tissue]
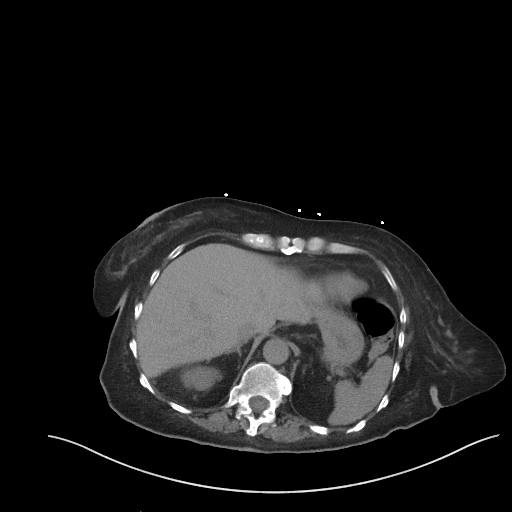
[im 80/95  soft-tissue]
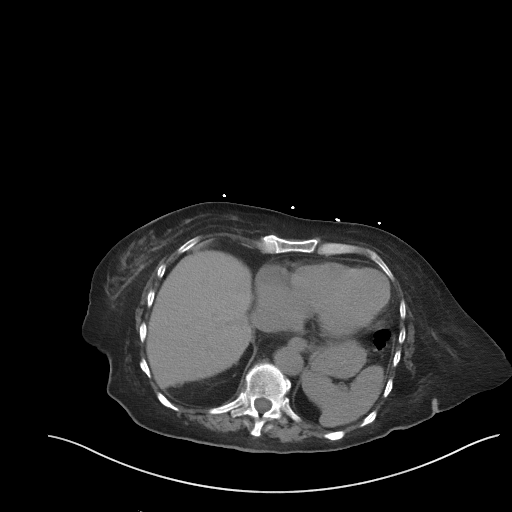
[im 90/95  soft-tissue]
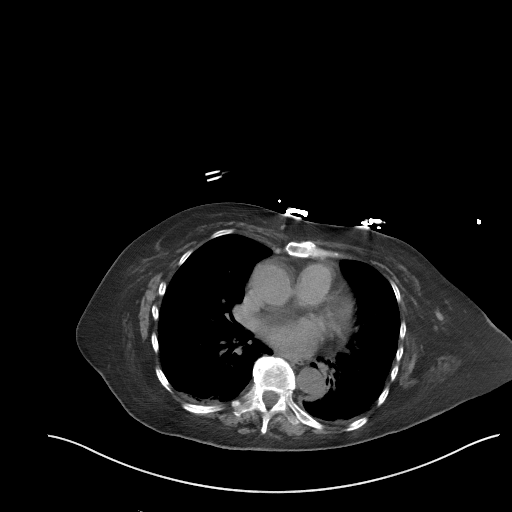

[Series 5: coronal · coronal · 0.81mm/px · 3 of 110 slices shown]
[im 37/110  soft-tissue]
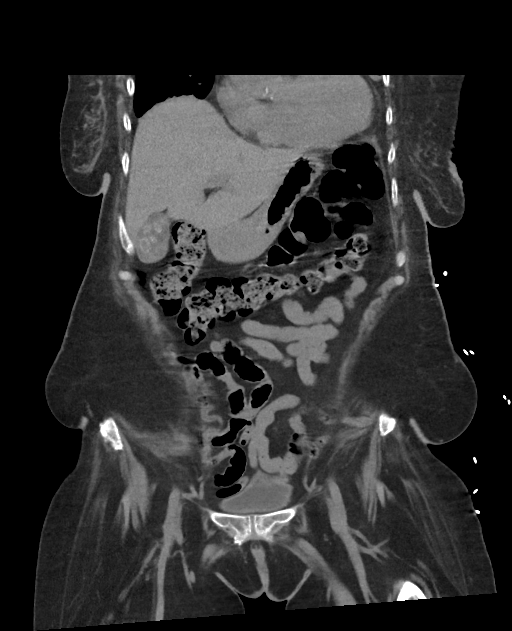
[im 49/110  soft-tissue]
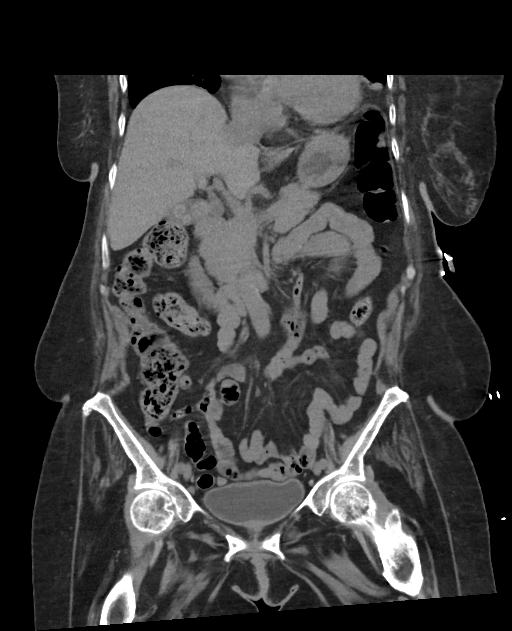
[im 61/110  soft-tissue]
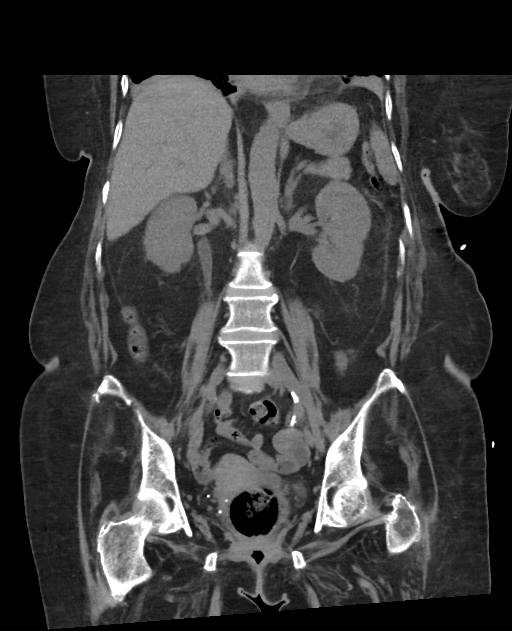

[15 of 46 positions shown; findings below may reference images not displayed]

FINDINGS: Lower chest: Bibasilar atelectasis greater left once again noted.

Hepatobiliary: Multiple gallstones. No CT evidence of surrounding
inflammation. If patient had right upper quadrant tenderness,
ultrasound can be obtained for further delineation.

Taking into account limitation by non contrast imaging, no worrisome
liver lesion.

Pancreas: Taking into account limitation by non contrast imaging, no
worrisome pancreatic lesion or inflammation.

Spleen: Taking into account limitation by non contrast imaging, no
worrisome splenic lesion or enlargement.

Adrenals/Urinary Tract: When compared to the recent CT, there is
mild fullness of the right renal collecting system without
obstructing stone identified. This may be related to recently passed
stone.

Bilateral tiny/small nonobstructing renal calculi are noted (largest
left lower pole measures 3.5 mm). Taking into account limitation by
non contrast imaging, no worrisome renal or adrenal lesion.

Noncontrast filled views of the urinary bladder unremarkable.

Stomach/Bowel: Moderate stool rectosigmoid region.

Superior extension sigmoid colon. No extraluminal bowel inflammatory
process noted.

Under distended stomach without gross abnormality. Tiny lipoma third
portion of the duodenum.

Vascular/Lymphatic: Aortic and iliac artery calcification. No
aneurysm. No adenopathy.

Reproductive: Retroverted uterus.  No adnexal mass.

Other: No free intraperitoneal air or bowel containing hernia.

Musculoskeletal: No worrisome osseous abnormality.
IMPRESSION: When compared to recent CT, there is mild fullness of the right
renal collecting system without obstructing stone identified. This
may be related to recently passed stone or blood clot.

Bilateral tiny/small nonobstructing renal calculi.

Multiple gallstones.

Bibasilar atelectasis greater left once again noted.

Moderate stool rectosigmoid region.

Superior extension sigmoid colon. No extraluminal bowel inflammatory
process noted.

Tiny lipoma third portion of the duodenum.

Aortic Atherosclerosis ([id number]).
